# Patient Record
Sex: Female | Born: 1950
Health system: Southern US, Community
[De-identification: ages and names within clinical notes are randomized; demographics above are authoritative.]

## PROBLEM LIST (undated history)

## (undated) DIAGNOSIS — K5909 Other constipation: Secondary | ICD-10-CM

## (undated) DIAGNOSIS — I8390 Asymptomatic varicose veins of unspecified lower extremity: Secondary | ICD-10-CM

## (undated) DIAGNOSIS — G629 Polyneuropathy, unspecified: Secondary | ICD-10-CM

## (undated) DIAGNOSIS — E785 Hyperlipidemia, unspecified: Secondary | ICD-10-CM

## (undated) DIAGNOSIS — Z9189 Other specified personal risk factors, not elsewhere classified: Secondary | ICD-10-CM

## (undated) DIAGNOSIS — N3941 Urge incontinence: Secondary | ICD-10-CM

## (undated) DIAGNOSIS — K219 Gastro-esophageal reflux disease without esophagitis: Secondary | ICD-10-CM

## (undated) DIAGNOSIS — R600 Localized edema: Secondary | ICD-10-CM

## (undated) DIAGNOSIS — I879 Disorder of vein, unspecified: Secondary | ICD-10-CM

## (undated) DIAGNOSIS — Z860100 Personal history of colon polyps, unspecified: Secondary | ICD-10-CM

## (undated) DIAGNOSIS — M1990 Unspecified osteoarthritis, unspecified site: Secondary | ICD-10-CM

## (undated) DIAGNOSIS — I10 Essential (primary) hypertension: Secondary | ICD-10-CM

## (undated) DIAGNOSIS — M47812 Spondylosis without myelopathy or radiculopathy, cervical region: Secondary | ICD-10-CM

## (undated) DIAGNOSIS — Z87898 Personal history of other specified conditions: Secondary | ICD-10-CM

## (undated) DIAGNOSIS — N3090 Cystitis, unspecified without hematuria: Secondary | ICD-10-CM

## (undated) DIAGNOSIS — N950 Postmenopausal bleeding: Secondary | ICD-10-CM

## (undated) DIAGNOSIS — Z923 Personal history of irradiation: Secondary | ICD-10-CM

## (undated) DIAGNOSIS — K589 Irritable bowel syndrome without diarrhea: Secondary | ICD-10-CM

## (undated) DIAGNOSIS — E890 Postprocedural hypothyroidism: Secondary | ICD-10-CM

## (undated) HISTORY — PX: CATARACT EXTRACTION W/ INTRAOCULAR LENS  IMPLANT, BILATERAL: SHX1307

## (undated) HISTORY — PX: JOINT REPLACEMENT: SHX530

## (undated) HISTORY — DX: Cystitis, unspecified without hematuria: N30.90

## (undated) HISTORY — DX: Hyperlipidemia, unspecified: E78.5

## (undated) HISTORY — PX: VARICOSE VEIN SURGERY: SHX832

## (undated) HISTORY — PX: EYE SURGERY: SHX253

## (undated) HISTORY — DX: Irritable bowel syndrome, unspecified: K58.9

## (undated) HISTORY — PX: MOUTH SURGERY: SHX715

## (undated) HISTORY — DX: Essential (primary) hypertension: I10

## (undated) HISTORY — DX: Asymptomatic varicose veins of unspecified lower extremity: I83.90

## (undated) HISTORY — PX: TUBAL LIGATION: SHX77

## (undated) HISTORY — PX: DILATION AND CURETTAGE OF UTERUS: SHX78

## (undated) HISTORY — DX: Polyneuropathy, unspecified: G62.9

---
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
## 1991-10-30 HISTORY — PX: LAPAROSCOPIC CHOLECYSTECTOMY: SUR755

## 1998-06-21 ENCOUNTER — Other Ambulatory Visit: Admission: RE | Admit: 1998-06-21 | Discharge: 1998-06-21 | Payer: Self-pay | Admitting: Obstetrics and Gynecology

## 2000-05-09 ENCOUNTER — Other Ambulatory Visit: Admission: RE | Admit: 2000-05-09 | Discharge: 2000-05-09 | Payer: Self-pay | Admitting: Obstetrics and Gynecology

## 2000-10-25 ENCOUNTER — Encounter: Payer: Self-pay | Admitting: Internal Medicine

## 2000-12-08 ENCOUNTER — Ambulatory Visit (HOSPITAL_COMMUNITY): Admission: RE | Admit: 2000-12-08 | Discharge: 2000-12-08 | Payer: Self-pay | Admitting: Family Medicine

## 2000-12-08 ENCOUNTER — Encounter: Payer: Self-pay | Admitting: Family Medicine

## 2001-09-23 ENCOUNTER — Other Ambulatory Visit: Admission: RE | Admit: 2001-09-23 | Discharge: 2001-09-23 | Payer: Self-pay | Admitting: Obstetrics and Gynecology

## 2003-03-04 ENCOUNTER — Other Ambulatory Visit: Admission: RE | Admit: 2003-03-04 | Discharge: 2003-03-04 | Payer: Self-pay | Admitting: Obstetrics and Gynecology

## 2005-02-07 ENCOUNTER — Other Ambulatory Visit: Admission: RE | Admit: 2005-02-07 | Discharge: 2005-02-07 | Payer: Self-pay | Admitting: Obstetrics and Gynecology

## 2005-10-31 ENCOUNTER — Ambulatory Visit: Payer: Self-pay | Admitting: Cardiology

## 2007-01-07 ENCOUNTER — Ambulatory Visit: Payer: Self-pay | Admitting: Vascular Surgery

## 2007-04-14 ENCOUNTER — Ambulatory Visit: Payer: Self-pay | Admitting: Vascular Surgery

## 2007-04-22 ENCOUNTER — Ambulatory Visit: Payer: Self-pay | Admitting: Vascular Surgery

## 2007-04-28 ENCOUNTER — Ambulatory Visit: Payer: Self-pay | Admitting: Vascular Surgery

## 2007-04-29 HISTORY — PX: ENDOVENOUS ABLATION SAPHENOUS VEIN W/ LASER: SUR449

## 2007-05-06 ENCOUNTER — Ambulatory Visit: Payer: Self-pay | Admitting: Vascular Surgery

## 2007-06-24 ENCOUNTER — Ambulatory Visit: Payer: Self-pay | Admitting: Vascular Surgery

## 2008-10-11 ENCOUNTER — Ambulatory Visit: Payer: Self-pay | Admitting: Internal Medicine

## 2008-10-25 ENCOUNTER — Ambulatory Visit: Payer: Self-pay | Admitting: Internal Medicine

## 2008-10-25 ENCOUNTER — Encounter: Payer: Self-pay | Admitting: Internal Medicine

## 2008-10-25 HISTORY — PX: COLONOSCOPY W/ POLYPECTOMY: SHX1380

## 2008-10-31 ENCOUNTER — Encounter: Payer: Self-pay | Admitting: Internal Medicine

## 2009-02-28 ENCOUNTER — Ambulatory Visit: Payer: Self-pay | Admitting: Vascular Surgery

## 2009-04-25 ENCOUNTER — Ambulatory Visit: Payer: Self-pay | Admitting: Vascular Surgery

## 2009-05-03 ENCOUNTER — Ambulatory Visit: Payer: Self-pay | Admitting: Vascular Surgery

## 2009-12-19 ENCOUNTER — Inpatient Hospital Stay (HOSPITAL_COMMUNITY): Admission: RE | Admit: 2009-12-19 | Discharge: 2009-12-22 | Payer: Self-pay | Admitting: Orthopedic Surgery

## 2009-12-19 HISTORY — PX: TOTAL KNEE ARTHROPLASTY: SHX125

## 2010-01-16 ENCOUNTER — Encounter: Admission: RE | Admit: 2010-01-16 | Discharge: 2010-04-17 | Payer: Self-pay | Admitting: Orthopedic Surgery

## 2010-02-08 ENCOUNTER — Ambulatory Visit: Payer: Self-pay | Admitting: Vascular Surgery

## 2010-02-08 ENCOUNTER — Encounter (INDEPENDENT_AMBULATORY_CARE_PROVIDER_SITE_OTHER): Payer: Self-pay | Admitting: Orthopedic Surgery

## 2010-02-08 ENCOUNTER — Ambulatory Visit: Admission: RE | Admit: 2010-02-08 | Discharge: 2010-02-08 | Payer: Self-pay | Admitting: Orthopedic Surgery

## 2010-02-20 ENCOUNTER — Ambulatory Visit (HOSPITAL_COMMUNITY): Admission: RE | Admit: 2010-02-20 | Discharge: 2010-02-20 | Payer: Self-pay | Admitting: Orthopedic Surgery

## 2010-02-20 HISTORY — PX: OTHER SURGICAL HISTORY: SHX169

## 2010-04-18 ENCOUNTER — Encounter
Admission: RE | Admit: 2010-04-18 | Discharge: 2010-07-17 | Payer: Self-pay | Source: Home / Self Care | Admitting: Family Medicine

## 2010-11-22 LAB — CBC
HCT: 40 % (ref 36.0–46.0)
Hemoglobin: 13.9 g/dL (ref 12.0–15.0)
MCH: 28.8 pg (ref 26.0–34.0)
MCHC: 34.8 g/dL (ref 30.0–36.0)
MCV: 82.8 fL (ref 78.0–100.0)
Platelets: 187 10*3/uL (ref 150–400)
RBC: 4.83 MIL/uL (ref 3.87–5.11)
RDW: 13.7 % (ref 11.5–15.5)
WBC: 5.7 10*3/uL (ref 4.0–10.5)

## 2010-11-22 LAB — COMPREHENSIVE METABOLIC PANEL
ALT: 19 U/L (ref 0–35)
AST: 25 U/L (ref 0–37)
Albumin: 4 g/dL (ref 3.5–5.2)
Alkaline Phosphatase: 104 U/L (ref 39–117)
BUN: 13 mg/dL (ref 6–23)
CO2: 29 mEq/L (ref 19–32)
Calcium: 9.7 mg/dL (ref 8.4–10.5)
Chloride: 105 mEq/L (ref 96–112)
Creatinine, Ser: 1.07 mg/dL (ref 0.4–1.2)
GFR calc Af Amer: >60 mL/min (ref >60)
GFR calc non Af Amer: 52 mL/min — ABNORMAL LOW (ref >60)
Glucose, Bld: 94 mg/dL (ref 70–99)
Potassium: 4.6 mEq/L (ref 3.5–5.1)
Sodium: 140 mEq/L (ref 135–145)
Total Bilirubin: 0.5 mg/dL (ref 0.3–1.2)
Total Protein: 7.1 g/dL (ref 6.0–8.3)

## 2010-11-22 LAB — URINALYSIS, ROUTINE W REFLEX MICROSCOPIC
Bilirubin Urine: NEGATIVE
Hgb urine dipstick: NEGATIVE
Ketones, ur: NEGATIVE mg/dL
Nitrite: NEGATIVE
Protein, ur: NEGATIVE mg/dL
Specific Gravity, Urine: 1.006 (ref 1.005–1.030)
Urine Glucose, Fasting: NEGATIVE mg/dL
Urobilinogen, UA: 0.2 mg/dL (ref 0.0–1.0)
pH: 7 (ref 5.0–8.0)

## 2010-11-22 LAB — PROTIME-INR
INR: 1.12 (ref 0.00–1.49)
Prothrombin Time: 14.6 seconds (ref 11.6–15.2)

## 2010-11-22 LAB — APTT: aPTT: 32 seconds (ref 24–37)

## 2010-11-22 LAB — SURGICAL PCR SCREEN
MRSA, PCR: NEGATIVE
Staphylococcus aureus: NEGATIVE

## 2010-12-01 ENCOUNTER — Ambulatory Visit (HOSPITAL_COMMUNITY)
Admission: RE | Admit: 2010-12-01 | Discharge: 2010-12-02 | Disposition: A | Discharge status: Home / Self Care | Payer: BC Managed Care – PPO | Attending: Orthopedic Surgery | Admitting: Orthopedic Surgery

## 2010-12-01 DIAGNOSIS — Z01812 Encounter for preprocedural laboratory examination: Secondary | ICD-10-CM | POA: Insufficient documentation

## 2010-12-01 DIAGNOSIS — I10 Essential (primary) hypertension: Secondary | ICD-10-CM | POA: Insufficient documentation

## 2010-12-01 DIAGNOSIS — M24669 Ankylosis, unspecified knee: Secondary | ICD-10-CM | POA: Insufficient documentation

## 2010-12-01 DIAGNOSIS — M25669 Stiffness of unspecified knee, not elsewhere classified: Secondary | ICD-10-CM | POA: Insufficient documentation

## 2010-12-01 HISTORY — PX: OTHER SURGICAL HISTORY: SHX169

## 2010-12-05 ENCOUNTER — Ambulatory Visit: Payer: BC Managed Care – PPO | Attending: Orthopedic Surgery | Admitting: Physical Therapy

## 2010-12-05 DIAGNOSIS — IMO0001 Reserved for inherently not codable concepts without codable children: Secondary | ICD-10-CM | POA: Insufficient documentation

## 2010-12-05 DIAGNOSIS — Z96659 Presence of unspecified artificial knee joint: Secondary | ICD-10-CM | POA: Insufficient documentation

## 2010-12-05 DIAGNOSIS — R5381 Other malaise: Secondary | ICD-10-CM | POA: Insufficient documentation

## 2010-12-05 DIAGNOSIS — M25569 Pain in unspecified knee: Secondary | ICD-10-CM | POA: Insufficient documentation

## 2010-12-05 DIAGNOSIS — M25669 Stiffness of unspecified knee, not elsewhere classified: Secondary | ICD-10-CM | POA: Insufficient documentation

## 2010-12-06 ENCOUNTER — Ambulatory Visit: Payer: BC Managed Care – PPO | Admitting: Physical Therapy

## 2010-12-07 ENCOUNTER — Ambulatory Visit: Payer: BC Managed Care – PPO | Admitting: Physical Therapy

## 2010-12-07 ENCOUNTER — Ambulatory Visit: Payer: BC Managed Care – PPO | Admitting: *Deleted

## 2010-12-11 ENCOUNTER — Ambulatory Visit: Payer: BC Managed Care – PPO | Admitting: Physical Therapy

## 2010-12-13 ENCOUNTER — Ambulatory Visit: Payer: BC Managed Care – PPO | Admitting: Physical Therapy

## 2010-12-13 NOTE — Op Note (Signed)
  [name], [name]               ACCOUNT NO.:  [id number]  MEDICAL RECORD NO.:  [id number]           PATIENT TYPE:  I  LOCATION:  1613                         FACILITY:  [facility]  PHYSICIAN:  Ollen Gross, M.D.    DATE OF BIRTH:  01-16-1951  DATE OF PROCEDURE: DATE OF DISCHARGE:                              OPERATIVE REPORT   PREOPERATIVE DIAGNOSIS:  Arthrofibrosis, left knee.  POSTOPERATIVE DIAGNOSIS:  Arthrofibrosis, left knee.  PROCEDURE:  Left knee arthrotomy with lysis of adhesions.  SURGEON:  Ollen Gross, MD  ASSISTANT:  Nurse assist.  ANESTHESIA:  General.  ESTIMATED BLOOD LOSS:  Minimal.  DRAINS:  Hemovac x1.  TOURNIQUET TIME:  Approximately 20 minutes at 300 mmHg.  COMPLICATIONS:  None.  CONDITION:  Stable to recovery room.  CLINICAL NOTE:  Miranda Wilson is a 60 year old female who had a left total knee arthroplasty done approximately a year ago.  She never did well with range of motion.  She had manipulation but did not maintain her motion.  She has developed significant scarring range, about 5-85.  She presents now for arthrotomy with lysis of adhesions.  PROCEDURE IN DETAIL:  After successful administration of general anesthetic, a tourniquet was placed high on her left thigh and her left lower extremity was prepped and draped in the usual sterile fashion. Extremities wrapped in Esmarch, the knee flexed, tourniquet inflated to 300 mmHg.  Her range is about 5-85.  I did a gentle manipulation with my chest against her proximal tibia and navicular at 100.  We then used a 10 blade making a skin incision through subcutaneous tissue to the level of the extensor mechanism and a fresh blade is used to make a medial arthrotomy.  I did not encounter any fluid in the joint.  There was exuberant scar present underneath the extensor mechanism.  I subperiosteally elevated the soft tissue on the proximal medial tibia around the joint line to the semimembranosus bursa.   All the thickened scar which is about an inch of scar was excised from underneath the medial side.  We then did the same for lateral side with attention being paid to avoid patellar tendon on tibial tubercle.  This really freed up the knee nicely.  I was able to get her flex down to 115 to 120 degrees at which point the cast and posterior thigh were touching.  Then thoroughly irrigated the joint with saline solution.  The arthrotomy was closed over Hemovac drain with interrupted #1 PDS.  Flexion against gravity at that point was about 115 once again with the calf in the thigh.  The tourniquet is released, total time is approximately 20 minutes.  Subcu is closed with interrupted 2-0 Vicryl, subcuticular running 4-0 Monocryl.  Incisions cleaned and dried and bulky sterile dressing applied.  Drains hooked to suction and she is awakened and transported to recovery in stable condition.     Ollen Gross, M.D.     FA/MEDQ  D:  12/01/2010  T:  12/01/2010  Job:  [id number]  Electronically Signed by Ollen Gross M.D. on 12/13/2010 02:54:04 PM

## 2010-12-15 ENCOUNTER — Ambulatory Visit: Payer: BC Managed Care – PPO | Admitting: Physical Therapy

## 2010-12-19 ENCOUNTER — Ambulatory Visit: Payer: BC Managed Care – PPO | Admitting: Physical Therapy

## 2010-12-20 ENCOUNTER — Ambulatory Visit: Payer: BC Managed Care – PPO | Admitting: Physical Therapy

## 2010-12-21 ENCOUNTER — Ambulatory Visit: Payer: BC Managed Care – PPO | Admitting: Physical Therapy

## 2010-12-25 ENCOUNTER — Ambulatory Visit: Payer: BC Managed Care – PPO | Admitting: Physical Therapy

## 2010-12-27 ENCOUNTER — Ambulatory Visit: Payer: BC Managed Care – PPO | Admitting: Physical Therapy

## 2010-12-29 ENCOUNTER — Ambulatory Visit: Payer: BC Managed Care – PPO | Attending: Orthopedic Surgery | Admitting: *Deleted

## 2010-12-29 DIAGNOSIS — M25669 Stiffness of unspecified knee, not elsewhere classified: Secondary | ICD-10-CM | POA: Insufficient documentation

## 2010-12-29 DIAGNOSIS — R5381 Other malaise: Secondary | ICD-10-CM | POA: Insufficient documentation

## 2010-12-29 DIAGNOSIS — M25569 Pain in unspecified knee: Secondary | ICD-10-CM | POA: Insufficient documentation

## 2010-12-29 DIAGNOSIS — Z96659 Presence of unspecified artificial knee joint: Secondary | ICD-10-CM | POA: Insufficient documentation

## 2010-12-29 DIAGNOSIS — IMO0001 Reserved for inherently not codable concepts without codable children: Secondary | ICD-10-CM | POA: Insufficient documentation

## 2011-01-01 ENCOUNTER — Ambulatory Visit: Payer: BC Managed Care – PPO | Admitting: Physical Therapy

## 2011-01-03 ENCOUNTER — Ambulatory Visit: Payer: BC Managed Care – PPO | Admitting: Physical Therapy

## 2011-01-04 ENCOUNTER — Ambulatory Visit: Payer: BC Managed Care – PPO | Admitting: Physical Therapy

## 2011-01-08 ENCOUNTER — Ambulatory Visit: Payer: BC Managed Care – PPO | Admitting: Physical Therapy

## 2011-01-11 ENCOUNTER — Ambulatory Visit: Payer: BC Managed Care – PPO | Admitting: Physical Therapy

## 2011-01-12 ENCOUNTER — Ambulatory Visit: Payer: BC Managed Care – PPO | Admitting: *Deleted

## 2011-01-15 ENCOUNTER — Ambulatory Visit: Payer: BC Managed Care – PPO | Admitting: Physical Therapy

## 2011-01-16 LAB — COMPREHENSIVE METABOLIC PANEL
AST: 24 U/L (ref 0–37)
Albumin: 4.1 g/dL (ref 3.5–5.2)
BUN: 9 mg/dL (ref 6–23)
Calcium: 9.3 mg/dL (ref 8.4–10.5)
Creatinine, Ser: 0.94 mg/dL (ref 0.4–1.2)
GFR calc Af Amer: >60 mL/min (ref >60)
Total Protein: 6.8 g/dL (ref 6.0–8.3)

## 2011-01-17 ENCOUNTER — Ambulatory Visit: Payer: BC Managed Care – PPO | Admitting: Physical Therapy

## 2011-01-18 LAB — CBC
HCT: 31 % — ABNORMAL LOW (ref 36.0–46.0)
HCT: 31.2 % — ABNORMAL LOW (ref 36.0–46.0)
HCT: 32.6 % — ABNORMAL LOW (ref 36.0–46.0)
Hemoglobin: 11 g/dL — ABNORMAL LOW (ref 12.0–15.0)
Hemoglobin: 11.3 g/dL — ABNORMAL LOW (ref 12.0–15.0)
MCHC: 34.7 g/dL (ref 30.0–36.0)
MCHC: 35.4 g/dL (ref 30.0–36.0)
MCV: 87.2 fL (ref 78.0–100.0)
MCV: 87.8 fL (ref 78.0–100.0)
RBC: 3.53 MIL/uL — ABNORMAL LOW (ref 3.87–5.11)
RBC: 3.58 MIL/uL — ABNORMAL LOW (ref 3.87–5.11)
RBC: 3.69 MIL/uL — ABNORMAL LOW (ref 3.87–5.11)
WBC: 5.8 10*3/uL (ref 4.0–10.5)

## 2011-01-18 LAB — COMPREHENSIVE METABOLIC PANEL
ALT: 23 U/L (ref 0–35)
Alkaline Phosphatase: 88 U/L (ref 39–117)
BUN: 11 mg/dL (ref 6–23)
CO2: 28 mEq/L (ref 19–32)
Chloride: 107 mEq/L (ref 96–112)
GFR calc non Af Amer: 57 mL/min — ABNORMAL LOW (ref >60)
Glucose, Bld: 99 mg/dL (ref 70–99)
Potassium: 4.7 mEq/L (ref 3.5–5.1)
Sodium: 144 mEq/L (ref 135–145)
Total Bilirubin: 0.6 mg/dL (ref 0.3–1.2)
Total Protein: 7 g/dL (ref 6.0–8.3)

## 2011-01-18 LAB — BASIC METABOLIC PANEL
CO2: 27 mEq/L (ref 19–32)
Calcium: 8.3 mg/dL — ABNORMAL LOW (ref 8.4–10.5)
Chloride: 109 mEq/L (ref 96–112)
GFR calc Af Amer: >60 mL/min (ref >60)
GFR calc Af Amer: >60 mL/min (ref >60)
Potassium: 4.3 mEq/L (ref 3.5–5.1)
Potassium: 4.3 mEq/L (ref 3.5–5.1)
Sodium: 136 mEq/L (ref 135–145)

## 2011-01-18 LAB — PROTIME-INR
INR: 1.07 (ref 0.00–1.49)
Prothrombin Time: 13 seconds (ref 11.6–15.2)

## 2011-01-18 LAB — TYPE AND SCREEN: Antibody Screen: NEGATIVE

## 2011-01-19 ENCOUNTER — Ambulatory Visit: Payer: BC Managed Care – PPO | Admitting: Physical Therapy

## 2011-01-22 ENCOUNTER — Ambulatory Visit: Payer: BC Managed Care – PPO | Admitting: Physical Therapy

## 2011-01-24 ENCOUNTER — Ambulatory Visit: Payer: BC Managed Care – PPO | Admitting: Physical Therapy

## 2011-01-26 ENCOUNTER — Ambulatory Visit: Payer: BC Managed Care – PPO | Admitting: Physical Therapy

## 2011-01-29 ENCOUNTER — Ambulatory Visit: Payer: BC Managed Care – PPO | Attending: Orthopedic Surgery | Admitting: Physical Therapy

## 2011-01-29 DIAGNOSIS — R5381 Other malaise: Secondary | ICD-10-CM | POA: Insufficient documentation

## 2011-01-29 DIAGNOSIS — IMO0001 Reserved for inherently not codable concepts without codable children: Secondary | ICD-10-CM | POA: Insufficient documentation

## 2011-01-29 DIAGNOSIS — M25569 Pain in unspecified knee: Secondary | ICD-10-CM | POA: Insufficient documentation

## 2011-01-29 DIAGNOSIS — M25669 Stiffness of unspecified knee, not elsewhere classified: Secondary | ICD-10-CM | POA: Insufficient documentation

## 2011-01-29 DIAGNOSIS — Z96659 Presence of unspecified artificial knee joint: Secondary | ICD-10-CM | POA: Insufficient documentation

## 2011-01-31 ENCOUNTER — Ambulatory Visit: Payer: BC Managed Care – PPO | Admitting: Physical Therapy

## 2011-02-05 ENCOUNTER — Ambulatory Visit: Payer: BC Managed Care – PPO | Admitting: Physical Therapy

## 2011-02-07 ENCOUNTER — Ambulatory Visit: Payer: BC Managed Care – PPO | Admitting: Physical Therapy

## 2011-02-09 ENCOUNTER — Ambulatory Visit: Payer: BC Managed Care – PPO | Admitting: Physical Therapy

## 2011-02-13 ENCOUNTER — Ambulatory Visit: Payer: BC Managed Care – PPO | Admitting: Physical Therapy

## 2011-02-15 ENCOUNTER — Ambulatory Visit: Payer: BC Managed Care – PPO | Admitting: Physical Therapy

## 2011-02-19 ENCOUNTER — Ambulatory Visit: Payer: BC Managed Care – PPO | Admitting: Physical Therapy

## 2011-02-22 ENCOUNTER — Ambulatory Visit: Payer: BC Managed Care – PPO | Admitting: *Deleted

## 2011-02-26 ENCOUNTER — Ambulatory Visit: Payer: BC Managed Care – PPO | Admitting: Physical Therapy

## 2011-03-01 ENCOUNTER — Ambulatory Visit: Payer: BC Managed Care – PPO | Attending: Orthopedic Surgery | Admitting: Physical Therapy

## 2011-03-01 DIAGNOSIS — M25569 Pain in unspecified knee: Secondary | ICD-10-CM | POA: Insufficient documentation

## 2011-03-01 DIAGNOSIS — R5381 Other malaise: Secondary | ICD-10-CM | POA: Insufficient documentation

## 2011-03-01 DIAGNOSIS — IMO0001 Reserved for inherently not codable concepts without codable children: Secondary | ICD-10-CM | POA: Insufficient documentation

## 2011-03-01 DIAGNOSIS — M25669 Stiffness of unspecified knee, not elsewhere classified: Secondary | ICD-10-CM | POA: Insufficient documentation

## 2011-03-01 DIAGNOSIS — Z96659 Presence of unspecified artificial knee joint: Secondary | ICD-10-CM | POA: Insufficient documentation

## 2011-03-02 ENCOUNTER — Other Ambulatory Visit: Payer: Self-pay | Admitting: Obstetrics and Gynecology

## 2011-03-05 ENCOUNTER — Ambulatory Visit: Payer: BC Managed Care – PPO | Admitting: Physical Therapy

## 2011-03-07 ENCOUNTER — Encounter: Payer: Self-pay | Admitting: Family Medicine

## 2011-03-08 ENCOUNTER — Ambulatory Visit: Payer: BC Managed Care – PPO | Admitting: Physical Therapy

## 2011-03-13 NOTE — Assessment & Plan Note (Signed)
OFFICE VISIT   Miranda, Wilson  DOB:  [birth year]                                       04/22/2007  ZOXWR#:[id number]   [name] returns 7 days post laser ablation of the right greater  saphenous vein with multiple stab phlebectomies.  She has some moderate  ecchymoses in the mid to distal thigh with some mild to moderate  discomfort along the course of the greater saphenous vein.  The stab  phlebectomy wounds are all healing nicely and involve the mid to distal  thigh, as well as the proximal calf.  She has no distal edema.  A  limited venous duplex exam was performed in the office today.  The deep  venous system is widely patent with no evidence of obstruction, and the  greater saphenous vein is occluded from just near the sapheno-femoral  junction down to the knee.  She was reassured regarding these findings.  Will continue wearing the elastic compression stocking and return next  week for the procedure on the contralateral left side.   [name], M.D.  Electronically Signed   JDL/MEDQ  D:  04/22/2007  T:  04/23/2007  Job:  [id number]

## 2011-03-13 NOTE — Assessment & Plan Note (Signed)
OFFICE VISIT   Miranda Wilson, Miranda Wilson  DOB:  28-Apr-1951                                       05/03/2009  ZOXWR#:[id number]   The patient had laser ablation of the lateral branch of the left great  saphenous vein ones week ago.  This communicated with the saphenofemoral  junction was causing gross reflux down to the distal left leg and  causing recurrent pain.  She had minimal discomfort associated with the  procedure a week ago and is now discontinuing her ibuprofen which she  has been taking as directed (9 per day).  She is having no distal edema  in the left leg.  Elastic stocking has been worn.  Venous duplex exam  performed today reveals no evidence of deep venous obstruction.  The  entire great saphenous vein including the lateral branch is now occluded  from the knee to the groin with no flow and there is some mild deep  venous insufficiency noted.   I reassured her regarding these findings.  She will wear the stocking  for one more week and continue to increase her activity as tolerated.  Return to see [place] on a p.r.n. basis.   [name], M.D.  Electronically Signed   JDL/MEDQ  D:  05/03/2009  T:  05/04/2009  Job:  [id number]

## 2011-03-13 NOTE — Procedures (Signed)
DUPLEX DEEP VENOUS EXAM - LOWER EXTREMITY   INDICATION:  Followup left lateral branch of greater saphenous vein  ablation.   HISTORY:  Edema:  Minimal left ankle/foot  Trauma/Surgery:  Left greater saphenous vein lateral branch ablation  April 25, 2009; left greater saphenous vein ablation April 28, 2007.  Both  by Dr. Hart [place].  Pain:  Minimal proximal left lower extremity  PE:  No  Previous DVT:  No  Anticoagulants:  No  Other:   DUPLEX EXAM:                CFV   SFV   PopV  PTV    GSV                R  L  R  L  R  L  R   L  R  L  Thrombosis    0  0     0     0      0     +  Spontaneous   +  +     +     +      +     0  Phasic        +  +     +     +      +     0  Augmentation  +  +     +     +      +     0  Compressible  +  +     +     +      +     0  Competent     0  0     0     0            0   Legend:  + - yes  o - no  p - partial  D - decreased   IMPRESSION:  No evidence of DVT in left lower extremity or right common  femoral vein.  Evidence of ablation in left greater saphenous vein from groin to knee  without flow.  Evidence of ablation in left greater saphenous vein lateral branch from  groin to mid-thigh without flow.  Evidence of deep venous insufficiency.        _____________________________  [name], M.D.   AS/MEDQ  D:  05/03/2009  T:  05/03/2009  Job:  [id number]

## 2011-03-13 NOTE — Assessment & Plan Note (Signed)
OFFICE VISIT   Miranda Wilson, Miranda Wilson  DOB:  06-Aug-1951                                       06/24/2007  MWUXL#:[id number]   Hoskinson is 6 weeks status post laser ablation left greater saphenous vein  with stab phlebectomies and 8 weeks status post laser ablation stab  phlebectomies right leg. 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 She is standing on her  feet all day. Today wore elastic compression stockings which do irritate  her thighs somewhat but swelling was much better. She has also been  taking a diuretic.   PHYSICAL EXAMINATION:  Reveals minimal edema since the stockings have  been worn today. The stab phlebectomy wounds are all well-healed. The  calves nontender and everything looks appropriate. Blood pressure  135/80, heart rate 63, respirations are 18.   I recommended that she continue to wear elastic compression stockings,  elevate the legs at night, try diuretics late in the day, and if she  continues to have generalized edema she should be evaluated by her  medical doctor.   [name], M.D.  Electronically Signed   JDL/MEDQ  D:  06/24/2007  T:  06/25/2007  Job:  [id number]

## 2011-03-13 NOTE — Assessment & Plan Note (Signed)
OFFICE VISIT   Miranda Wilson, Miranda Wilson  DOB:  04-15-1951                                       05/06/2007  [id number]#:[id number]   OFFICE NOTE   The patient is 1 week post laser ablation of her left greater saphenous  vein with greater than 20 stab phlebectomies in the left thigh and calf.  She has had a moderate amount of bruising in the left thigh over the  ablation site and a few of the stab phlebectomy wounds in the distal  thigh with some mild to moderate tenderness, although she states this is  improving.  She has been wearing her elastic compression stocking.  She  has had no distal edema.  There has been minimal pain in the stab  phlebectomy sites in the calf.  I have performed a venous duplex exam  today.  She had no evidence of deep vein thrombosis or any obstruction  of the deep system and the saphenous vein is occluded from near the  saphenofemoral junction down to the distal thigh.  She was reassured  regarding these findings.  Will return in 3 months for continued  followup.   [name], M.D.  Electronically Signed   JDL/MEDQ  D:  05/06/2007  T:  05/07/2007  Job:  [id number]

## 2011-03-13 NOTE — Assessment & Plan Note (Signed)
OFFICE VISIT   KINGA, CASSAR  DOB:  [birth year]                                       02/28/2009  ZOXWR#:[id number]   The patient underwent laser ablation of her left great saphenous vein in  June 2008 for painful varicosities and also had 20 stab phlebectomies  for these painful varicosities.  She early on had a good result but has  had worsening of the edema in the left leg and increasing pain and  discoloration in the lower pretibial area of the leg over the last  several months.  She bumped her pretibial area in November 2009 and  developed some firmness in this area which has persisted and has  continued to cause pain.  She has worn long-leg elastic compression  stockings and tried elevating the legs multiple times a day as well as  analgesics on a regular basis (ibuprofen) and has continued to have pain  and worsening edema of the left leg over the last 6 months.  She had no  stasis ulcers or bleeding varicosities or other complicating factors.   PHYSICAL EXAMINATION:  On exam today she does have 2+ edema in the left  leg particularly from the knee to the ankle with some thickening of the  skin of hyperpigmentation medially, between the medial malleolus and the  knee and two firm areas to palpation.  No bulging varicosities are  noted.  Right leg is unremarkable with exception of some small  varicosities around the knee medially.   Venous duplex exam today has the following findings:  1. Deep venous system is widely patent.  2. The great saphenous vein in the left leg is occluded from the mid      thigh distally.  3. There is a large component of the saphenofemoral junction which      communicates with the lateral branch which has gross reflux down to      the mid thigh before it becomes superficial and communicates with      varicosities.  This is the source of the high venous pressure in      the left leg.   Her symptoms are definitely effecting  her daily living and not  responding to conservative treatment.  I think we should proceed with  laser ablation of the left great saphenous vein (lateral branch) from  the mid thigh up to the saphenofemoral junction to eliminate reflux and  hopefully eliminate her edema and improve her symptoms.  We will proceed  with precertification.   [name], M.D.  Electronically Signed   JDL/MEDQ  D:  02/28/2009  T:  03/01/2009  Job:  [id number]

## 2011-06-07 ENCOUNTER — Encounter: Payer: Self-pay | Admitting: Vascular Surgery

## 2011-06-11 ENCOUNTER — Encounter (INDEPENDENT_AMBULATORY_CARE_PROVIDER_SITE_OTHER): Payer: BC Managed Care – PPO

## 2011-06-11 ENCOUNTER — Encounter: Payer: Self-pay | Admitting: Vascular Surgery

## 2011-06-11 DIAGNOSIS — R609 Edema, unspecified: Secondary | ICD-10-CM

## 2011-06-11 DIAGNOSIS — I83893 Varicose veins of bilateral lower extremities with other complications: Secondary | ICD-10-CM

## 2011-06-11 DIAGNOSIS — Z48812 Encounter for surgical aftercare following surgery on the circulatory system: Secondary | ICD-10-CM

## 2011-06-11 DIAGNOSIS — M7989 Other specified soft tissue disorders: Secondary | ICD-10-CM

## 2011-06-12 ENCOUNTER — Ambulatory Visit (INDEPENDENT_AMBULATORY_CARE_PROVIDER_SITE_OTHER): Payer: BC Managed Care – PPO | Admitting: Vascular Surgery

## 2011-06-12 VITALS — BP 140/77 | HR 62 | Resp 20 | Ht 64.5 in | Wt 198.0 lb

## 2011-06-12 DIAGNOSIS — I83893 Varicose veins of bilateral lower extremities with other complications: Secondary | ICD-10-CM

## 2011-06-12 NOTE — Progress Notes (Signed)
Subjective:     Patient ID: Miranda Wilson, female   DOB: 12-07-1950, 60 y.o.   MRN: [id number]  HPI this patient had laser ablation of left great saphenous vein with stab phlebectomy was performed in June of 08. She then had laser ablation of the left great saphenous vein lateral accessory trunk in June of 2010. She had a knee replacement performed in February of 2011 and since then has required both an open and close manipulation of the left knee because of decreased range of motion. She has been having significant swelling in the left leg she's been aggravated by her surgical procedures she has tried where short-leg last compression stockings but has swelling occur proximal to the stocking. Her long-leg stockings are too tight. She does not elevate her legs at night nor intermittently during the day. She had an episode of suspected phlebitis in the lower leg just proximal to the left ankle 2 months ago. She has had no DVT.   Review of Systems     Objective:   Physical Exam today blood pressure 140/77 heart rate 62 respirations 20 She's alert and oriented x3 in no apparent distress Left leg has diffuse edema from the mid thigh to the foot. His is 1-2+. There is mild discomfort in an area proximal to the left medial malleolus which appears to be no active ulcerations are noted. She has 2+ pulses in the left leg. l resolving thrombophlebitis.    Assessment:    venous duplex exam was ordered by me today and interpreted of both lower extremities. Left leg has complete closure of the left great saphenous system. There is no DVT. Both small saphenous veins are competent. The right proximal GSv is patent with reflux down to the mid thigh level.    Plan:    #1 elevate for the bed 3-4 inches #2 we'll fit her today for Long leg elastic compression stockings which she will apply first thing in the morning #3 intermittent elevation of the leg during the day #4 warm compresses near left ankle for resolving  thrombophlebitis

## 2011-06-12 NOTE — Progress Notes (Signed)
Left leg pain, swelling, thrombophlebitis for greater than 2 months.

## 2011-06-22 NOTE — Procedures (Unsigned)
DUPLEX DEEP VENOUS EXAM - LOWER EXTREMITY  INDICATION:  Edema and varicose veins.  HISTORY:  Edema:  Yes. Trauma/Surgery:  Right great saphenous vein ablated 04/14/2007; left great saphenous ablated 04/28/2007; left lateral branch ablated 04/25/2009. Pain:  Yes. PE:  No. Previous DVT:  No. Anticoagulants:  No. Other:  DUPLEX EXAM:               CFV   SFV   PopV  PTV    GSV               R  L  R  L  R  L  R   L  R  L Thrombosis    o  o  o  o  o  o  o   o  P  + Spontaneous   +  +  +  +  +  +  +   +  o  o Phasic        +  +  +  +  +  +  +   +  +  o Augmentation  +  +  +  +  +  +  +   +  D  o Compressible  +  +  +  +  +  +  +   +  P  o Competent     o  +  +  o  +  +  +   +  o  +  Legend:  + - yes  o - no  p - partial  D - decreased  IMPRESSION: 1. No evidence of deep vein thrombosis present in the bilateral lower     extremities. 2. Partial right GSV ablation closure from mid to distal thigh     segment. 3. The remainder of the right great saphenous vein from saphenofemoral     junction to mid thigh and knee level is open with >500 milliseconds     reflux present. 4. The left great saphenous vein and lateral branch appear thrombosed     with good post ablation result from groin to distal thigh segment. 5. Patent competent bilateral small saphenous veins at the knee. 6. Incompetent perforator present at the left lateral mid/distal calf     segment measuring 0.44 cm in diameter with >500 milliseconds reflux     present. 7. The left great saphenous vein knee segment presents with reflux of     >500 milliseconds.  Varicose veins are noted arising off the     greater saphenous vein in the calf segments of the left great     saphenous.       _____________________________ [name], M.D.  SH/MEDQ  D:  06/11/2011  T:  06/11/2011  Job:  [id number]

## 2011-08-14 ENCOUNTER — Institutional Professional Consult (permissible substitution): Payer: BC Managed Care – PPO | Admitting: Internal Medicine

## 2011-08-20 ENCOUNTER — Ambulatory Visit (INDEPENDENT_AMBULATORY_CARE_PROVIDER_SITE_OTHER): Payer: BC Managed Care – PPO | Admitting: Internal Medicine

## 2011-08-20 ENCOUNTER — Encounter: Payer: Self-pay | Admitting: Internal Medicine

## 2011-08-20 DIAGNOSIS — R9389 Abnormal findings on diagnostic imaging of other specified body structures: Secondary | ICD-10-CM

## 2011-08-20 DIAGNOSIS — R05 Cough: Secondary | ICD-10-CM

## 2011-08-20 DIAGNOSIS — R918 Other nonspecific abnormal finding of lung field: Secondary | ICD-10-CM

## 2011-08-20 NOTE — Progress Notes (Signed)
  Subjective:    Patient ID: Miranda Wilson, female    DOB: 1951/05/28, 60 y.o.   MRN: [id number]  HPI   61 yowf exposed to cig smoke as child until age 58 but never smoked with tendency to lingering cough with colds not more than a month typically referred by Dr Christell Constant to pulmonary clinic 07/2011 for refractory cough and cxr suggesting copd.    08/20/2011 Initial pulmonary office eval cc sore throat then cough abrupt end Sept and seen by Dr Christell Constant Oct 3 rx with mucinex with multiple family members sick at the same time with some dysphagia and cough is worse p eating. Has noticed some doe x housework. Overall improving,  More productive p mucuinex with yellow less dark worse in am p stirring.  Does not wake up p hs.  No previous h/o overt hb/ seasonal rhinitis or asthma, itching or sneezing or wheezing.  Chronic left leg swelling L leg      Review of Systems  Constitutional: Negative for fever, chills and unexpected weight change.  HENT: Positive for trouble swallowing. Negative for ear pain, nosebleeds, congestion, sore throat, rhinorrhea, sneezing, dental problem, voice change, postnasal drip and sinus pressure.   Eyes: Negative for visual disturbance.  Respiratory: Positive for cough and shortness of breath. Negative for choking.   Cardiovascular: Positive for leg swelling. Negative for chest pain.  Gastrointestinal: Negative for vomiting, abdominal pain and diarrhea.  Genitourinary: Negative for difficulty urinating.  Musculoskeletal: Positive for arthralgias.  Skin: Negative for rash.  Neurological: Negative for tremors, syncope and headaches.  Hematological: Bruises/bleeds easily.       Objective:   Physical Exam  amb wf wt 205 08/20/2011  HEENT: nl dentition, turbinates, and orophanx. Nl external ear canals without cough reflex   NECK :  without JVD/Nodes/TM/ nl carotid upstrokes bilaterally   LUNGS: no acc muscle use, clear to A and P bilaterally without cough on insp  or exp maneuvers   CV:  RRR  no s3 or murmur or increase in P2, wearing ted hose  ABD:  soft and nontender with nl excursion in the supine position. No bruits or organomegaly, bowel sounds nl  MS:  warm without deformities, calf tenderness, cyanosis or clubbing  SKIN: warm and dry without lesions    NEURO:  alert, approp, no deficits   cxr 08/01/11 read as copd    Assessment & Plan:

## 2011-08-20 NOTE — Patient Instructions (Signed)
You do not have significant copd  Deslym is the best over the counter cough medication  Try prilosec 20mg   Take 30-60 min before first meal of the day and Pepcid 20 mg one bedtime until cough is completely gone for at least a week without the need for cough suppression  I think of reflux for chronic cough like I do oxygen for fire (doesn't cause the fire but once you get the oxygen suppressed it usually goes away regardless of the exact cause).   GERD (REFLUX)  is an extremely common cause of respiratory symptoms, many times with no significant heartburn at all.    It can be treated with medication, but also with lifestyle changes including avoidance of late meals, excessive alcohol, smoking cessation, and avoid fatty foods, chocolate, peppermint, colas, red wine, and acidic juices such as orange juice.  NO MINT OR MENTHOL PRODUCTS SO NO COUGH DROPS  USE SUGARLESS CANDY INSTEAD (jolley ranchers or Stover's)  NO OIL BASED VITAMINS - use powdered substitutes.   Please schedule a follow up office visit in 6 weeks, call sooner if needed for CXR

## 2011-08-21 ENCOUNTER — Encounter: Payer: Self-pay | Admitting: Internal Medicine

## 2011-08-21 DIAGNOSIS — R05 Cough: Secondary | ICD-10-CM | POA: Insufficient documentation

## 2011-08-21 DIAGNOSIS — R059 Cough, unspecified: Secondary | ICD-10-CM | POA: Insufficient documentation

## 2011-08-21 DIAGNOSIS — R9389 Abnormal findings on diagnostic imaging of other specified body structures: Secondary | ICD-10-CM | POA: Insufficient documentation

## 2011-08-21 NOTE — Assessment & Plan Note (Signed)
The most common causes of chronic cough in immunocompetent adults include the following: upper airway cough syndrome (UACS), previously referred to as postnasal drip syndrome (PNDS), which is caused by variety of rhinosinus conditions; (2) asthma; (3) GERD; (4) chronic bronchitis from cigarette smoking or other inhaled environmental irritants; (5) nonasthmatic eosinophilic bronchitis; and (6) bronchiectasis.   These conditions, singly or in combination, have accounted for up to 94% of the causes of chronic cough in prospective studies.   Other conditions have constituted no >6% of the causes in prospective studies These have included bronchogenic carcinoma, chronic interstitial pneumonia, sarcoidosis, left ventricular failure, ACEI-induced cough, and aspiration from a condition associated with pharyngeal dysfunction.  Of the three most common causes of chronic cough, only one (GERD)  can actually cause the other two (asthma and post nasal drip syndrome)  and perpetuate the cylce of cough inducing airway trauma, inflammation, heightened sensitivity to reflux which is prompted by the cough itself via a cyclical mechanism.    This may partially respond to steroids and look like asthma and post nasal drainage but never erradicated completely unless the cough and the secondary reflux are eliminated, preferably both at the same time.  While not intuitively obvious, many patients with chronic low grade reflux do not cough until there is a secondary insult that disturbs the protective epithelial barrier and exposes sensitive nerve endings.  This can be viral or direct physical injury such as with an endotracheal tube.   The point is that once this occurs, it is difficult to eliminate using anything but a maximally effective acid suppression regimen at least in the short run, accompanied by an appropriate diet to address non acid GERD.   See instructions for specific recommendations which were reviewed directly  with the patient who was given a copy with highlighter outlining the key components.  

## 2011-08-21 NOTE — Assessment & Plan Note (Signed)
Very unlikley she has sign copd though does have a tendency to recurrent cough and positive past exposure to cigarettes.  Will do alpha one genotype testing and PFTs to be complete

## 2011-09-13 ENCOUNTER — Encounter: Payer: Self-pay | Admitting: Internal Medicine

## 2011-09-14 ENCOUNTER — Telehealth: Payer: Self-pay | Admitting: Internal Medicine

## 2011-09-14 NOTE — Telephone Encounter (Signed)
Per MW alpha 1 was neg. Spoke with pt and notified of this and she verbalized understanding and denied any questions.

## 2011-09-24 ENCOUNTER — Ambulatory Visit: Payer: BC Managed Care – PPO | Admitting: Internal Medicine

## 2011-09-27 ENCOUNTER — Encounter: Payer: Self-pay | Admitting: Internal Medicine

## 2012-12-24 DIAGNOSIS — I83893 Varicose veins of bilateral lower extremities with other complications: Secondary | ICD-10-CM

## 2013-03-18 ENCOUNTER — Other Ambulatory Visit: Payer: Self-pay | Admitting: Family Medicine

## 2013-03-24 ENCOUNTER — Encounter: Payer: Self-pay | Admitting: *Deleted

## 2013-03-24 ENCOUNTER — Other Ambulatory Visit: Payer: Self-pay | Admitting: Family Medicine

## 2013-04-15 ENCOUNTER — Ambulatory Visit: Payer: Self-pay | Admitting: Family Medicine

## 2013-04-28 ENCOUNTER — Encounter: Payer: Self-pay | Admitting: Family Medicine

## 2013-04-28 ENCOUNTER — Ambulatory Visit (INDEPENDENT_AMBULATORY_CARE_PROVIDER_SITE_OTHER): Payer: BC Managed Care – PPO | Admitting: Family Medicine

## 2013-04-28 VITALS — BP 126/68 | HR 54 | Temp 97.0°F | Ht 63.0 in | Wt 211.6 lb

## 2013-04-28 DIAGNOSIS — E559 Vitamin D deficiency, unspecified: Secondary | ICD-10-CM | POA: Insufficient documentation

## 2013-04-28 DIAGNOSIS — M1990 Unspecified osteoarthritis, unspecified site: Secondary | ICD-10-CM

## 2013-04-28 DIAGNOSIS — R5381 Other malaise: Secondary | ICD-10-CM

## 2013-04-28 DIAGNOSIS — E039 Hypothyroidism, unspecified: Secondary | ICD-10-CM

## 2013-04-28 DIAGNOSIS — E785 Hyperlipidemia, unspecified: Secondary | ICD-10-CM | POA: Insufficient documentation

## 2013-04-28 DIAGNOSIS — M4716 Other spondylosis with myelopathy, lumbar region: Secondary | ICD-10-CM

## 2013-04-28 DIAGNOSIS — R5383 Other fatigue: Secondary | ICD-10-CM

## 2013-04-28 DIAGNOSIS — R32 Unspecified urinary incontinence: Secondary | ICD-10-CM

## 2013-04-28 DIAGNOSIS — L259 Unspecified contact dermatitis, unspecified cause: Secondary | ICD-10-CM

## 2013-04-28 DIAGNOSIS — L309 Dermatitis, unspecified: Secondary | ICD-10-CM

## 2013-04-28 DIAGNOSIS — I10 Essential (primary) hypertension: Secondary | ICD-10-CM

## 2013-04-28 LAB — THYROID PANEL WITH TSH
Free Thyroxine Index: 4.6 — ABNORMAL HIGH (ref 1.0–3.9)
T3 Uptake: 29.5 % (ref 22.5–37.0)

## 2013-04-28 LAB — POCT CBC
HCT, POC: 40.4 % (ref 37.7–47.9)
Hemoglobin: 14 g/dL (ref 12.2–16.2)
MCH, POC: 29.8 pg (ref 27–31.2)
MCHC: 34.7 g/dL (ref 31.8–35.4)
RBC: 4.7 M/uL (ref 4.04–5.48)

## 2013-04-28 MED ORDER — NYSTATIN-TRIAMCINOLONE 100000-0.1 UNIT/GM-% EX OINT: TOPICAL_OINTMENT | CUTANEOUS | Status: DC

## 2013-04-28 MED ORDER — ROSUVASTATIN CALCIUM 40 MG PO TABS: 20.0000 mg | ORAL_TABLET | Freq: Every day | ORAL | Status: DC

## 2013-04-28 MED ORDER — LEVOTHYROXINE SODIUM 175 MCG PO TABS: 175.0000 ug | ORAL_TABLET | Freq: Every day | ORAL | Status: DC

## 2013-04-28 MED ORDER — ATENOLOL 50 MG PO TABS: 50.0000 mg | ORAL_TABLET | Freq: Every day | ORAL | Status: DC

## 2013-04-28 MED ORDER — EZETIMIBE 10 MG PO TABS: 10.0000 mg | ORAL_TABLET | Freq: Every day | ORAL | Status: DC

## 2013-04-28 NOTE — Progress Notes (Signed)
  Subjective:    Patient ID: Miranda Wilson, female    DOB: Oct 17, 1951, 62 y.o.   MRN: [id number]  HPI    Review of Systems  Constitutional: Positive for fatigue.  HENT: Negative.   Eyes: Positive for itching (dryness).  Respiratory: Negative.   Cardiovascular: Positive for leg swelling (L>R).  Gastrointestinal: Positive for constipation (due to meds). Negative for abdominal pain.  Endocrine: Negative.   Genitourinary: Positive for frequency (totally incontinent).  Musculoskeletal: Positive for myalgias (bilateral calf ), back pain (cervical, R mid back) and arthralgias (R knee > L).  Skin: Positive for rash (frequent).  Psychiatric/Behavioral: Positive for sleep disturbance (to go to the restroom).       Objective:   Physical Exam BP 126/68  Pulse 54  Temp(Src) 97 F (36.1 C) (Oral)  Ht 5\' 3"  (1.6 m)  Wt 211 lb 9.6 oz (95.981 kg)  BMI 37.49 kg/m2  LMP 04/29/1991  The patient appeared well nourished and normally developed, alert and oriented to time and place. Speech, behavior and judgement appear normal. She was somewhat limited in her mobility due to her bilateral knee pain. Vital signs as documented.  Head exam is unremarkable. No scleral icterus or pallor noted.  Neck is without jugular venous distension, thyromegally, or carotid bruits. Carotid upstrokes are brisk bilaterally. No cervical adenopathy. Lungs are clear anteriorly and posteriorly to auscultation. Normal respiratory effort. Cardiac exam reveals regular rate and rhythm at 72 per minute. First and second heart sounds normal.  No murmurs, rubs or gallops.  Abdominal exam reveals normal bowl sounds, no masses, no organomegaly and no aortic enlargement. No inguinal adenopathy. There is some epigastric and suprapubic tenderness. There is obesity. Extremities are nonedematous and both  pedal pulses are normal. There is swelling of both knees with limited mobility. Skin without pallor or jaundice.  Warm and dry,  without rash. Neurologic exam reveals normal deep tendon reflexes and normal sensation.          Assessment & Plan:   1. Fatigue - POCT CBC; Standing - Thyroid Panel With TSH - BASIC METABOLIC PANEL WITH GFR; Standing  2. Vitamin D deficiency - Vitamin D 25 hydroxy; Standing  3. Hyperlipemia - Hepatic function panel; Standing - NMR Lipoprofile with Lipids; Standing  4. Hypertension  5. Osteoarthritis  6. Lumbar spondylosis with myelopathy  7. Incontinence of urine  8. Hypothyroidism  Patient Instructions  Fall precautions discussed Continue current meds and therapeutic lifestyle changes   Nyra Capes MD

## 2013-04-28 NOTE — Patient Instructions (Addendum)
Fall precautions discussed Continue current meds and therapeutic lifestyle changes 

## 2013-04-29 LAB — HEPATIC FUNCTION PANEL
AST: 25 U/L (ref 0–37)
Albumin: 4.2 g/dL (ref 3.5–5.2)
Alkaline Phosphatase: 91 U/L (ref 39–117)
Indirect Bilirubin: 0.5 mg/dL (ref 0.0–0.9)
Total Bilirubin: 0.6 mg/dL (ref 0.3–1.2)
Total Protein: 6.6 g/dL (ref 6.0–8.3)

## 2013-04-29 LAB — NMR LIPOPROFILE WITH LIPIDS
Cholesterol, Total: 159 mg/dL (ref <200)
LDL Particle Number: 1132 nmol/L — ABNORMAL HIGH (ref <1000)
Large HDL-P: 9.4 umol/L (ref >=4.8)
Large VLDL-P: 3.1 nmol/L — ABNORMAL HIGH (ref <=2.7)
Small LDL Particle Number: 450 nmol/L (ref <=527)
Triglycerides: 84 mg/dL (ref <150)
VLDL Size: 49 nm — ABNORMAL HIGH (ref <=46.6)

## 2013-04-29 LAB — BASIC METABOLIC PANEL WITH GFR
BUN: 17 mg/dL (ref 6–23)
CO2: 24 mEq/L (ref 19–32)
Calcium: 9.8 mg/dL (ref 8.4–10.5)
Creat: 1.03 mg/dL (ref 0.50–1.10)

## 2013-04-30 ENCOUNTER — Telehealth: Payer: Self-pay | Admitting: Family Medicine

## 2013-05-11 ENCOUNTER — Telehealth: Payer: Self-pay | Admitting: Family Medicine

## 2013-05-11 DIAGNOSIS — E785 Hyperlipidemia, unspecified: Secondary | ICD-10-CM

## 2013-05-13 MED ORDER — ROSUVASTATIN CALCIUM 40 MG PO TABS: 40.0000 mg | ORAL_TABLET | ORAL | Status: DC

## 2013-05-13 NOTE — Telephone Encounter (Signed)
Discussed results and recommendations. Samples of crestor 20mg  #35 placed up front. Script changed to crestor 40mg  take one po daily as directed # 30 with 6 refills.

## 2013-06-23 ENCOUNTER — Other Ambulatory Visit: Payer: Self-pay | Admitting: Family Medicine

## 2013-08-12 ENCOUNTER — Other Ambulatory Visit: Payer: Self-pay | Admitting: Obstetrics and Gynecology

## 2013-08-12 DIAGNOSIS — Z01419 Encounter for gynecological examination (general) (routine) without abnormal findings: Secondary | ICD-10-CM | POA: Diagnosis not present

## 2013-08-12 DIAGNOSIS — Z124 Encounter for screening for malignant neoplasm of cervix: Secondary | ICD-10-CM | POA: Diagnosis not present

## 2013-08-12 DIAGNOSIS — Z1231 Encounter for screening mammogram for malignant neoplasm of breast: Secondary | ICD-10-CM | POA: Diagnosis not present

## 2013-09-17 DIAGNOSIS — M1710 Unilateral primary osteoarthritis, unspecified knee: Secondary | ICD-10-CM | POA: Diagnosis not present

## 2013-11-09 ENCOUNTER — Encounter: Payer: Self-pay | Admitting: Internal Medicine

## 2013-11-18 DIAGNOSIS — M1710 Unilateral primary osteoarthritis, unspecified knee: Secondary | ICD-10-CM | POA: Diagnosis not present

## 2013-11-25 DIAGNOSIS — M1710 Unilateral primary osteoarthritis, unspecified knee: Secondary | ICD-10-CM | POA: Diagnosis not present

## 2013-12-03 DIAGNOSIS — M1710 Unilateral primary osteoarthritis, unspecified knee: Secondary | ICD-10-CM | POA: Diagnosis not present

## 2013-12-31 DIAGNOSIS — N950 Postmenopausal bleeding: Secondary | ICD-10-CM | POA: Diagnosis not present

## 2013-12-31 DIAGNOSIS — D261 Other benign neoplasm of corpus uteri: Secondary | ICD-10-CM | POA: Diagnosis not present

## 2014-03-27 ENCOUNTER — Encounter: Payer: Self-pay | Admitting: Internal Medicine

## 2014-04-08 DIAGNOSIS — M1710 Unilateral primary osteoarthritis, unspecified knee: Secondary | ICD-10-CM | POA: Diagnosis not present

## 2014-05-03 ENCOUNTER — Other Ambulatory Visit: Payer: Self-pay | Admitting: Family Medicine

## 2014-05-31 ENCOUNTER — Ambulatory Visit (INDEPENDENT_AMBULATORY_CARE_PROVIDER_SITE_OTHER): Payer: BC Managed Care – PPO

## 2014-05-31 ENCOUNTER — Encounter: Payer: Self-pay | Admitting: Family Medicine

## 2014-05-31 ENCOUNTER — Ambulatory Visit (INDEPENDENT_AMBULATORY_CARE_PROVIDER_SITE_OTHER): Payer: BC Managed Care – PPO | Admitting: Family Medicine

## 2014-05-31 VITALS — BP 150/72 | HR 48 | Temp 97.0°F | Ht 63.0 in | Wt 214.0 lb

## 2014-05-31 DIAGNOSIS — K219 Gastro-esophageal reflux disease without esophagitis: Secondary | ICD-10-CM | POA: Diagnosis not present

## 2014-05-31 DIAGNOSIS — E8881 Metabolic syndrome: Secondary | ICD-10-CM | POA: Diagnosis not present

## 2014-05-31 DIAGNOSIS — E559 Vitamin D deficiency, unspecified: Secondary | ICD-10-CM

## 2014-05-31 DIAGNOSIS — E785 Hyperlipidemia, unspecified: Secondary | ICD-10-CM

## 2014-05-31 DIAGNOSIS — E039 Hypothyroidism, unspecified: Secondary | ICD-10-CM | POA: Diagnosis not present

## 2014-05-31 DIAGNOSIS — R001 Bradycardia, unspecified: Secondary | ICD-10-CM | POA: Insufficient documentation

## 2014-05-31 DIAGNOSIS — M159 Polyosteoarthritis, unspecified: Secondary | ICD-10-CM

## 2014-05-31 DIAGNOSIS — I10 Essential (primary) hypertension: Secondary | ICD-10-CM

## 2014-05-31 DIAGNOSIS — I498 Other specified cardiac arrhythmias: Secondary | ICD-10-CM

## 2014-05-31 DIAGNOSIS — M150 Primary generalized (osteo)arthritis: Secondary | ICD-10-CM

## 2014-05-31 DIAGNOSIS — J301 Allergic rhinitis due to pollen: Secondary | ICD-10-CM

## 2014-05-31 DIAGNOSIS — Z13820 Encounter for screening for osteoporosis: Secondary | ICD-10-CM

## 2014-05-31 LAB — POCT CBC

## 2014-05-31 LAB — POCT GLYCOSYLATED HEMOGLOBIN (HGB A1C): Hemoglobin A1C: 5.4

## 2014-05-31 MED ORDER — GLUCOSE BLOOD VI STRP: ORAL_STRIP | Status: DC

## 2014-05-31 MED ORDER — ONETOUCH ULTRASOFT LANCETS MISC: Status: DC

## 2014-05-31 NOTE — Progress Notes (Signed)
Subjective:    Patient ID: Miranda Wilson, female    DOB: 08-23-1951, 63 y.o.   MRN: [id number]  HPI Pt here for follow up and management of chronic medical problems. The patient comes to the visit today complaining of some dizziness. She also had some nausea and this may have been associated with taking antibiotics for dental procedure. She's also concerned about a skin lesion on her 4. In addition she's had some increased thirst and vision changes. She requested all her medications be refilled.       Patient Active Problem List   Diagnosis Date Noted  . Hyperlipemia 04/28/2013  . Vitamin D deficiency 04/28/2013  . Lumbar spondylosis with myelopathy 04/28/2013  . Osteoarthritis 04/28/2013  . Hypertension 04/28/2013  . Incontinence of urine 04/28/2013  . Hypothyroidism 04/28/2013  . Cough 08/21/2011  . Abnormal CXR 08/21/2011   Outpatient Encounter Prescriptions as of 05/31/2014  Medication Sig  . aspirin 81 MG tablet Take 81 mg by mouth daily.    Marland Kitchen atenolol (TENORMIN) 50 MG tablet Take 1 tablet (50 mg total) by mouth daily.  . Calcium Carbonate-Vitamin D (CALTRATE 600+D) 600-400 MG-UNIT per tablet Take 1 tablet by mouth daily.  Sarajane Marek Sodium 30-100 MG CAPS Take 1 tablet by mouth daily.   . Cholecalciferol (VITAMIN D) 1000 UNITS capsule Take 2,000 Units by mouth daily. 2000iu daily except 4000iu on Sat and Sun  . levothyroxine (SYNTHROID, LEVOTHROID) 175 MCG tablet Take 1 tablet (175 mcg total) by mouth daily before breakfast.  . Multiple Vitamin (MULTIVITAMIN WITH MINERALS) TABS tablet Take 1 tablet by mouth daily.  . Polyethylene Glycol 3350 (MIRALAX PO) Take by mouth as needed.    . rosuvastatin (CRESTOR) 40 MG tablet Take 1 tablet (40 mg total) by mouth as directed.  Marland Kitchen ZETIA 10 MG tablet TAKE 1 TABLET (10 MG TOTAL) BY MOUTH DAILY.  . [DISCONTINUED] ciclopirox (PENLAC) 8 % solution   . [DISCONTINUED] clindamycin (CLEOCIN) 150 MG capsule   . [DISCONTINUED]  levothyroxine (SYNTHROID, LEVOTHROID) 175 MCG tablet Take 1 tablet (175 mcg total) by mouth daily.  . [DISCONTINUED] nystatin-triamcinolone ointment (MYCOLOG) Apply lightly twice daily  to affected rash as needed    Review of Systems  Constitutional: Negative.   HENT: Negative.        Thirsty, blurred vision at times  Eyes: Negative.   Respiratory: Negative.   Cardiovascular: Negative.   Gastrointestinal: Positive for nausea.  Endocrine: Negative.   Genitourinary: Negative.   Musculoskeletal: Negative.   Skin: Negative.   Allergic/Immunologic: Negative.   Neurological: Positive for dizziness.  Hematological: Negative.   Psychiatric/Behavioral: Negative.        Objective:   Physical Exam  Nursing note and vitals reviewed. Constitutional: She is oriented to person, place, and time. She appears well-developed and well-nourished. No distress.  The patient is pleasant and cooperative.  HENT:  Head: Normocephalic and atraumatic.  Right Ear: External ear normal.  Left Ear: External ear normal.  Mouth/Throat: Oropharynx is clear and moist.  There is nasal congestion and redness bilaterally.  Eyes: Conjunctivae and EOM are normal. Pupils are equal, round, and reactive to light. Right eye exhibits no discharge. Left eye exhibits no discharge. No scleral icterus.  Neck: Normal range of motion. Neck supple. No thyromegaly present.  There are no carotid bruits.  Cardiovascular: Regular rhythm, normal heart sounds and intact distal pulses.  Exam reveals no gallop and no friction rub.   No murmur heard.  The heart rhythm is regular  but bradycardic at about 48 per minute  Pulmonary/Chest: Effort normal and breath sounds normal. No respiratory distress. She has no wheezes. She has no rales. She exhibits no tenderness.  Abdominal: Soft. Bowel sounds are normal. She exhibits no mass. There is no tenderness. There is no rebound and no guarding.  The abdomen is obese and there is slight  epigastric tenderness  Musculoskeletal: She exhibits no edema and no tenderness.  The patient uses a cane for ambulation because of stiffness and pain in both knees. She is status post a left knee replacement and has a lot of scar tissue secondary to a knee replacement.  Lymphadenopathy:    She has no cervical adenopathy.  Neurological: She is alert and oriented to person, place, and time. She has normal reflexes. No cranial nerve deficit.  Skin: Skin is warm and dry. No rash noted.  The patient has what appears to be a seborrheic keratosis on her left upper forehead   Psychiatric: She has a normal mood and affect. Her behavior is normal. Judgment and thought content normal.   BP 150/72  Pulse 48  Temp(Src) 97 F (36.1 C) (Oral)  Ht '5\' 3"'  (1.6 m)  Wt 214 lb (97.07 kg)  BMI 37.92 kg/m2  LMP 04/29/1991  [facility] reading (PRIMARY) by  Dr.Jezebel Pollet-chest x-ray-no active disease                                        Assessment & Plan:  1. Vitamin D deficiency - POCT CBC - Vit D  25 hydroxy (rtn osteoporosis monitoring)  2. Hypothyroidism, unspecified hypothyroidism type - POCT CBC - Thyroid Panel With TSH  3. Essential hypertension - POCT CBC - BMP8+EGFR - Hepatic function panel  4. Hyperlipemia - POCT CBC - NMR, lipoprofile - DG Chest 2 View; Future  5. Metabolic syndrome - POCT CBC - POCT glycosylated hemoglobin (Hb A1C)  6. Screening for osteoporosis - DG Bone Density; Future  7. Gastroesophageal reflux disease, esophagitis presence not specified  8. Allergic rhinitis due to pollen  9. Primary osteoarthritis involving multiple joints  10. Bradycardia  Meds ordered this encounter  Medications  . Calcium Carbonate-Vitamin D (CALTRATE 600+D) 600-400 MG-UNIT per tablet    Sig: Take 1 tablet by mouth daily.  . Multiple Vitamin (MULTIVITAMIN WITH MINERALS) TABS tablet    Sig: Take 1 tablet by mouth daily.  Marland Kitchen DISCONTD: glucose blood test strip    Sig: Check BS daily  and PRN    Dispense:  100 each    Refill:  11  . Lancets (ONETOUCH ULTRASOFT) lancets    Sig: Check BS daily and PRN. Dx 250.0    Dispense:  100 each    Refill:  11  . glucose blood test strip    Sig: Check BS daily and PRN.dx 250.0    Dispense:  100 each    Refill:  11   Patient Instructions                       Medicare Annual Wellness Visit  [facility] and the medical providers at [facility] strive to bring you the best medical care.  In doing so we not only want to address your current medical conditions and concerns but also to detect new conditions early and prevent illness, disease and health-related problems.    Medicare offers a yearly  Wellness Visit which allows our clinical staff to assess your need for preventative services including immunizations, lifestyle education, counseling to decrease risk of preventable diseases and screening for fall risk and other medical concerns.    This visit is provided free of charge (no copay) for all Medicare recipients. The clinical pharmacists at [facility] have begun to conduct these Wellness Visits which will also include a thorough review of all your medications.    As you primary medical provider recommend that you make an appointment for your Annual Wellness Visit if you have not done so already this year.  You may set up this appointment before you leave today or you may call back ([phone]) and schedule an appointment.  Please make sure when you call that you mention that you are scheduling your Annual Wellness Visit with the clinical pharmacist so that the appointment may be made for the proper length of time.       Continue current medications. Continue good therapeutic lifestyle changes which include good diet and exercise. Fall precautions discussed with patient. If an FOBT was given today- please return it to our front desk. If you are over 47 years old - you may need Prevnar 15  or the adult Pneumonia vaccine.  Continue to followup with the orthopedic surgeon Drink as much water as possible  Monitor blood pressure with omron blood pressure monitor Monitor blood sugars as directed with flowsheets from the office Bring blood pressures and blood sugars by 4 reviewed in 4-5 weeks, associate any symptoms like dizziness or blurred vision with any readings home blood sugars or blood pressures Continue to exercise as tolerated You should make an appointment for the lesion on your 4 head for treatment by the dermatology Dr. Dennis Bast can purchase Zantac or ranitidine 150 over-the-counter one twice daily before breakfast and supper You can also purchase Flonase over-the-counter one spray each nostril at bedtime for allergic rhinitis   Arrie Senate MD

## 2014-05-31 NOTE — Patient Instructions (Addendum)
Medicare Annual Wellness Visit  [facility] and the medical providers at [facility] strive to bring you the best medical care.  In doing so we not only want to address your current medical conditions and concerns but also to detect new conditions early and prevent illness, disease and health-related problems.    Medicare offers a yearly Wellness Visit which allows our clinical staff to assess your need for preventative services including immunizations, lifestyle education, counseling to decrease risk of preventable diseases and screening for fall risk and other medical concerns.    This visit is provided free of charge (no copay) for all Medicare recipients. The clinical pharmacists at [facility] have begun to conduct these Wellness Visits which will also include a thorough review of all your medications.    As you primary medical provider recommend that you make an appointment for your Annual Wellness Visit if you have not done so already this year.  You may set up this appointment before you leave today or you may call back ([phone]) and schedule an appointment.  Please make sure when you call that you mention that you are scheduling your Annual Wellness Visit with the clinical pharmacist so that the appointment may be made for the proper length of time.       Continue current medications. Continue good therapeutic lifestyle changes which include good diet and exercise. Fall precautions discussed with patient. If an FOBT was given today- please return it to our front desk. If you are over 32 years old - you may need Prevnar 11 or the adult Pneumonia vaccine.  Continue to followup with the orthopedic surgeon Drink as much water as possible  Monitor blood pressure with omron blood pressure monitor Monitor blood sugars as directed with flowsheets from the office Bring blood pressures and blood sugars by 4 reviewed in 4-5  weeks, associate any symptoms like dizziness or blurred vision with any readings home blood sugars or blood pressures Continue to exercise as tolerated You should make an appointment for the lesion on your 4 head for treatment by the dermatology Dr. Dennis Bast can purchase Zantac or ranitidine 150 over-the-counter one twice daily before breakfast and supper You can also purchase Flonase over-the-counter one spray each nostril at bedtime for allergic rhinitis

## 2014-06-01 ENCOUNTER — Telehealth: Payer: Self-pay

## 2014-06-01 LAB — BMP8+EGFR
BUN/Creatinine Ratio: 17 (ref 11–26)
BUN: 16 mg/dL (ref 8–27)
CHLORIDE: 99 mmol/L (ref 97–108)
CO2: 27 mmol/L (ref 18–29)
Calcium: 9.4 mg/dL (ref 8.7–10.3)
Creatinine, Ser: 0.92 mg/dL (ref 0.57–1.00)
GFR calc Af Amer: 76 mL/min/{1.73_m2} (ref >59)
GFR calc non Af Amer: 66 mL/min/{1.73_m2} (ref >59)
GLUCOSE: 82 mg/dL (ref 65–99)
Potassium: 4.4 mmol/L (ref 3.5–5.2)
Sodium: 140 mmol/L (ref 134–144)

## 2014-06-01 LAB — HEPATIC FUNCTION PANEL
ALT: 20 IU/L (ref 0–32)
AST: 24 IU/L (ref 0–40)
Albumin: 4.5 g/dL (ref 3.6–4.8)
Alkaline Phosphatase: 93 IU/L (ref 39–117)
Bilirubin, Direct: 0.17 mg/dL (ref 0.00–0.40)
TOTAL PROTEIN: 6.7 g/dL (ref 6.0–8.5)
Total Bilirubin: 0.5 mg/dL (ref 0.0–1.2)

## 2014-06-01 LAB — NMR, LIPOPROFILE
Cholesterol: 186 mg/dL (ref 100–199)
HDL Cholesterol by NMR: 76 mg/dL (ref >39)
HDL Particle Number: 45.2 umol/L (ref >=30.5)
LDL PARTICLE NUMBER: 1210 nmol/L — AB (ref <1000)
LDL SIZE: 21 nm (ref >20.5)
LDLC SERPL CALC-MCNC: 92 mg/dL (ref 0–99)
LP-IR Score: 49 — ABNORMAL HIGH (ref <=45)
Small LDL Particle Number: 585 nmol/L — ABNORMAL HIGH (ref <=527)
TRIGLYCERIDES BY NMR: 89 mg/dL (ref 0–149)

## 2014-06-01 LAB — THYROID PANEL WITH TSH
Free Thyroxine Index: 3.5 (ref 1.2–4.9)
T3 UPTAKE RATIO: 27 % (ref 24–39)
T4 TOTAL: 13.1 ug/dL — AB (ref 4.5–12.0)
TSH: 0.376 u[IU]/mL — ABNORMAL LOW (ref 0.450–4.500)

## 2014-06-01 LAB — VITAMIN D 25 HYDROXY (VIT D DEFICIENCY, FRACTURES): Vit D, 25-Hydroxy: 26.9 ng/mL — ABNORMAL LOW (ref 30.0–100.0)

## 2014-06-01 NOTE — Telephone Encounter (Signed)
Pt aware of CXR results.

## 2014-06-01 NOTE — Telephone Encounter (Signed)
Message copied by Koren Bound on Tue Jun 01, 2014  8:54 AM ------      Message from: Chipper Herb      Created: Mon May 31, 2014  3:27 PM       As per radiology report ------

## 2014-06-02 ENCOUNTER — Telehealth: Payer: Self-pay | Admitting: Family Medicine

## 2014-06-02 NOTE — Telephone Encounter (Signed)
Message copied by Waverly Ferrari on Wed Jun 02, 2014  9:45 AM ------      Message from: Chipper Herb      Created: Tue Jun 01, 2014  7:19 AM       The blood sugar is good at 82. The creatinine, the most important kidney function tests is within normal limits. The electrolytes including potassium are within normal limits.      All liver function tests are within normal limits      With advanced lipid testing, the total LDL particle number is elevated at 1210. One year ago was 1132. The LDL C. is at goal less than 192. The triglycerides are good.------ continue Crestor and has aggressive therapeutic lifestyle changes as possible. Make sure that this patient has a coupon car to get her Crestor cheaper. This would be $3 for 3 months.+++++      The TSH is slightly low. This could mean that she is getting a little too much thyroid medication. The T4 is slightly elevated.----- we will make no changes in her medication at this point in time. Have her come by in 6 weeks and get another thyroid profile. She does not have to be fasting.++++      Vitamin D level is low.----Please call in a prescription for vitamin D 50,000 units one weekly for 12 weeks with one refill. She should have a vitamin D level repeated in 3 months ------

## 2014-06-09 ENCOUNTER — Telehealth: Payer: Self-pay | Admitting: Family Medicine

## 2014-06-11 ENCOUNTER — Other Ambulatory Visit: Payer: Self-pay | Admitting: *Deleted

## 2014-06-11 DIAGNOSIS — E039 Hypothyroidism, unspecified: Secondary | ICD-10-CM

## 2014-06-11 MED ORDER — VITAMIN D (ERGOCALCIFEROL) 1.25 MG (50000 UNIT) PO CAPS: 50000.0000 [IU] | ORAL_CAPSULE | ORAL | Status: DC

## 2014-06-11 NOTE — Telephone Encounter (Signed)
Labs phoned to pt

## 2014-06-12 ENCOUNTER — Other Ambulatory Visit: Payer: Self-pay | Admitting: Family Medicine

## 2014-06-15 ENCOUNTER — Ambulatory Visit (INDEPENDENT_AMBULATORY_CARE_PROVIDER_SITE_OTHER): Payer: BC Managed Care – PPO | Admitting: Family Medicine

## 2014-06-15 ENCOUNTER — Ambulatory Visit (INDEPENDENT_AMBULATORY_CARE_PROVIDER_SITE_OTHER): Payer: BC Managed Care – PPO

## 2014-06-15 ENCOUNTER — Encounter: Payer: Self-pay | Admitting: Family Medicine

## 2014-06-15 VITALS — BP 119/70 | HR 47 | Temp 96.8°F | Ht 63.0 in | Wt 218.0 lb

## 2014-06-15 DIAGNOSIS — M25519 Pain in unspecified shoulder: Secondary | ICD-10-CM | POA: Diagnosis not present

## 2014-06-15 DIAGNOSIS — G589 Mononeuropathy, unspecified: Secondary | ICD-10-CM | POA: Diagnosis not present

## 2014-06-15 DIAGNOSIS — G629 Polyneuropathy, unspecified: Secondary | ICD-10-CM

## 2014-06-15 DIAGNOSIS — M47812 Spondylosis without myelopathy or radiculopathy, cervical region: Secondary | ICD-10-CM | POA: Diagnosis not present

## 2014-06-15 DIAGNOSIS — M542 Cervicalgia: Secondary | ICD-10-CM

## 2014-06-15 DIAGNOSIS — M25511 Pain in right shoulder: Secondary | ICD-10-CM

## 2014-06-15 MED ORDER — NABUMETONE 500 MG PO TABS: 500.0000 mg | ORAL_TABLET | Freq: Two times a day (BID) | ORAL | Status: DC | PRN

## 2014-06-15 MED ORDER — NABUMETONE 750 MG PO TABS: 750.0000 mg | ORAL_TABLET | Freq: Two times a day (BID) | ORAL | Status: DC | PRN

## 2014-06-15 MED ORDER — METAXALONE 800 MG PO TABS: 800.0000 mg | ORAL_TABLET | Freq: Three times a day (TID) | ORAL | Status: DC

## 2014-06-15 NOTE — Progress Notes (Addendum)
Subjective:    Patient ID: Miranda Wilson, female    DOB: 03-07-1951, 63 y.o.   MRN: [id number]  HPI Patient here today for neck pain and left shoulder pain. This has been getting worse over the last week. She had problems with this in the past and saw the neurosurgeon and with physical therapy and traction she did get improvement.         Patient Active Problem List   Diagnosis Date Noted  . Metabolic syndrome 01/74/9449  . Bradycardia 05/31/2014  . Hyperlipemia 04/28/2013  . Vitamin D deficiency 04/28/2013  . Lumbar spondylosis with myelopathy 04/28/2013  . Osteoarthritis 04/28/2013  . Hypertension 04/28/2013  . Incontinence of urine 04/28/2013  . Hypothyroidism 04/28/2013  . Cough 08/21/2011  . Abnormal CXR 08/21/2011   Outpatient Encounter Prescriptions as of 06/15/2014  Medication Sig  . aspirin 81 MG tablet Take 81 mg by mouth daily.    Marland Kitchen atenolol (TENORMIN) 50 MG tablet TAKE 1 TABLET (50 MG TOTAL) BY MOUTH DAILY.  . Calcium Carbonate-Vitamin D (CALTRATE 600+D) 600-400 MG-UNIT per tablet Take 1 tablet by mouth daily.  Sarajane Marek Sodium 30-100 MG CAPS Take 1 tablet by mouth daily.   Marland Kitchen glucose blood test strip Check BS daily and PRN.dx 250.0  . Lancets (ONETOUCH ULTRASOFT) lancets Check BS daily and PRN. Dx 250.0  . levothyroxine (SYNTHROID, LEVOTHROID) 175 MCG tablet Take 1 tablet (175 mcg total) by mouth daily before breakfast.  . Multiple Vitamin (MULTIVITAMIN WITH MINERALS) TABS tablet Take 1 tablet by mouth daily.  . Polyethylene Glycol 3350 (MIRALAX PO) Take by mouth as needed.    . rosuvastatin (CRESTOR) 40 MG tablet Take 1 tablet (40 mg total) by mouth as directed.  . Vitamin D, Ergocalciferol, (DRISDOL) 50000 UNITS CAPS capsule Take 1 capsule (50,000 Units total) by mouth every 7 (seven) days.  Marland Kitchen ZETIA 10 MG tablet TAKE 1 TABLET (10 MG TOTAL) BY MOUTH DAILY.  . [DISCONTINUED] Cholecalciferol (VITAMIN D) 1000 UNITS capsule Take 2,000 Units by mouth  daily. 2000iu daily except 4000iu on Sat and Sun    Review of Systems  Constitutional: Negative.   HENT: Negative.   Eyes: Negative.   Respiratory: Negative.   Cardiovascular: Negative.   Gastrointestinal: Negative.   Endocrine: Negative.   Genitourinary: Negative.   Musculoskeletal: Positive for neck pain. Arthralgias: right shoulder pain.  Skin: Negative.   Allergic/Immunologic: Negative.   Neurological: Negative.   Hematological: Negative.   Psychiatric/Behavioral: Negative.        Objective:   Physical Exam  Nursing note and vitals reviewed. Constitutional: She is oriented to person, place, and time. She appears well-developed and well-nourished. She appears distressed.  HENT:  Head: Normocephalic and atraumatic.  Eyes: Conjunctivae and EOM are normal. Pupils are equal, round, and reactive to light. Right eye exhibits no discharge. Left eye exhibits no discharge. No scleral icterus.  Neck: Normal range of motion. Neck supple.  Cardiovascular:  Intact radial pulses bilaterally  Abdominal: Bowel sounds are normal.  Musculoskeletal: She exhibits tenderness. She exhibits no edema.  Limited range of motion of the neck secondary to pain in neck and upper back  Lymphadenopathy:    She has no cervical adenopathy.  Neurological: She is alert and oriented to person, place, and time. She has normal reflexes. No cranial nerve deficit.  Skin: Skin is warm and dry. No rash noted.  Psychiatric: She has a normal mood and affect. Her behavior is normal. Judgment and thought content normal.  BP 119/70  Pulse 47  Temp(Src) 96.8 F (36 C) (Oral)  Ht 5\' 3"  (1.6 m)  Wt 218 lb (98.884 kg)  BMI 38.63 kg/m2  LMP 04/29/1991  [facility] reading (PRIMARY) by  Dr. Shiela Mayer changes with disc space narrowing at C6 and C7 and spurring                                       Assessment & Plan:  1. Neck pain - DG Cervical Spine Complete; Future  2.Left shoulder pain - DG Cervical  Spine Complete; Future  3. Osteoarthritis cervical spine  4. Neuropathy  Meds ordered this encounter  Medications  . DISCONTD: nabumetone (RELAFEN) 750 MG tablet    Sig: Take 1 tablet (750 mg total) by mouth 2 (two) times daily as needed.    Dispense:  60 tablet    Refill:  1  . metaxalone (SKELAXIN) 800 MG tablet    Sig: Take 1 tablet (800 mg total) by mouth 3 (three) times daily.    Dispense:  30 tablet    Refill:  1  . nabumetone (RELAFEN) 500 MG tablet    Sig: Take 1 tablet (500 mg total) by mouth 2 (two) times daily as needed.    Dispense:  60 tablet    Refill:  1   Patient Instructions  Continue with warm wet compresses 20 minutes 4 or 5 times daily Take medication as directed We will arrange for an MRI if you are not better in 3 days We will subsequently schedule you for a visit with the neurosurgeon for possible injections   Arrie Senate MD

## 2014-06-15 NOTE — Patient Instructions (Signed)
Continue with warm wet compresses 20 minutes 4 or 5 times daily Take medication as directed We will arrange for an MRI if you are not better in 3 days We will subsequently schedule you for a visit with the neurosurgeon for possible injections

## 2014-06-17 ENCOUNTER — Telehealth: Payer: Self-pay

## 2014-06-17 NOTE — Telephone Encounter (Signed)
X ray results 

## 2014-06-18 ENCOUNTER — Telehealth: Payer: Self-pay | Admitting: Family Medicine

## 2014-06-18 DIAGNOSIS — M542 Cervicalgia: Secondary | ICD-10-CM

## 2014-06-18 DIAGNOSIS — M25512 Pain in left shoulder: Secondary | ICD-10-CM

## 2014-06-18 NOTE — Telephone Encounter (Signed)
Get cervical MRI and see if the patient is willing to try a short course of prednisone as discussed with her the other day plus an IM injection of 40 mg , if she does the prednisone she would have to stop her NSAID.

## 2014-06-18 NOTE — Telephone Encounter (Signed)
Pt does not want prednisone at this time MRI - ordered

## 2014-06-18 NOTE — Telephone Encounter (Signed)
Pt not better- should we go on and arrange cervical MRI?

## 2014-06-24 ENCOUNTER — Ambulatory Visit (HOSPITAL_COMMUNITY)
Admission: RE | Admit: 2014-06-24 | Discharge: 2014-06-24 | Disposition: A | Discharge status: Home / Self Care | Payer: BC Managed Care – PPO | Source: Ambulatory Visit | Attending: Family Medicine | Admitting: Family Medicine

## 2014-06-24 ENCOUNTER — Telehealth: Payer: Self-pay

## 2014-06-24 DIAGNOSIS — M47812 Spondylosis without myelopathy or radiculopathy, cervical region: Secondary | ICD-10-CM | POA: Diagnosis not present

## 2014-06-24 DIAGNOSIS — R29890 Loss of height: Secondary | ICD-10-CM | POA: Insufficient documentation

## 2014-06-24 DIAGNOSIS — M542 Cervicalgia: Secondary | ICD-10-CM | POA: Insufficient documentation

## 2014-06-24 DIAGNOSIS — M25512 Pain in left shoulder: Secondary | ICD-10-CM

## 2014-06-24 DIAGNOSIS — M5380 Other specified dorsopathies, site unspecified: Secondary | ICD-10-CM | POA: Diagnosis not present

## 2014-06-24 DIAGNOSIS — M4802 Spinal stenosis, cervical region: Secondary | ICD-10-CM | POA: Diagnosis not present

## 2014-06-24 NOTE — Telephone Encounter (Signed)
Pt aware of MRI results 

## 2014-06-24 NOTE — Telephone Encounter (Signed)
Message copied by Koren Bound on Thu Jun 24, 2014  2:32 PM ------      Message from: Chipper Herb      Created: Thu Jun 24, 2014  1:18 PM       Because of the degree of pain this patient is having both on the right and left sides of her neck and the inability to control this with anti-inflammatory medicines, this patient needs a referral to Dr. Joya Salm for further evaluation as soon as possible. The appointment in the next week would have to be in the morning, after the next week it can be anytime of the day. Please try to get this appointment as soon as possible ------

## 2014-07-06 ENCOUNTER — Telehealth: Payer: Self-pay

## 2014-07-06 NOTE — Telephone Encounter (Signed)
Patient said she has been waiting on a referral to Neurosurgeon Dr Joya Salm.  No referral put in for this patient

## 2014-07-06 NOTE — Telephone Encounter (Signed)
Please do this referral as soon as possible i.e. tomorrow. She has been here several times and the referral was put in on followup from that first visit.

## 2014-07-07 ENCOUNTER — Ambulatory Visit (INDEPENDENT_AMBULATORY_CARE_PROVIDER_SITE_OTHER): Payer: BC Managed Care – PPO

## 2014-07-07 ENCOUNTER — Encounter: Payer: Self-pay | Admitting: Pharmacist

## 2014-07-07 ENCOUNTER — Ambulatory Visit (INDEPENDENT_AMBULATORY_CARE_PROVIDER_SITE_OTHER): Payer: BC Managed Care – PPO | Admitting: Pharmacist

## 2014-07-07 ENCOUNTER — Other Ambulatory Visit: Payer: BC Managed Care – PPO

## 2014-07-07 VITALS — Ht 63.0 in | Wt 217.0 lb

## 2014-07-07 DIAGNOSIS — E559 Vitamin D deficiency, unspecified: Secondary | ICD-10-CM | POA: Diagnosis not present

## 2014-07-07 DIAGNOSIS — M4716 Other spondylosis with myelopathy, lumbar region: Secondary | ICD-10-CM | POA: Diagnosis not present

## 2014-07-07 DIAGNOSIS — Z1212 Encounter for screening for malignant neoplasm of rectum: Secondary | ICD-10-CM

## 2014-07-07 DIAGNOSIS — Z13820 Encounter for screening for osteoporosis: Secondary | ICD-10-CM

## 2014-07-07 NOTE — Progress Notes (Signed)
Patient ID: Miranda Wilson, female   DOB: [birth year], 63 y.o.   MRN: [id number] [facility] Current Height: Height: 5\' 3"  (160 cm)      Max Lifetime Height:  66" Current Weight: Weight: 217 lb (98.431 kg)       Ethnicity:Caucasian    HPI: Does pt already have a diagnosis of:  Osteopenia?  No Osteoporosis?  No  Back Pain?  Yes       Kyphosis?  No Prior fracture?  No Med(s) for Osteoporosis/Osteopenia:  none Med(s) previously tried for Osteoporosis/Osteopenia:  noen                                                             PMH: Age at menopause:  63 yo Hysterectomy?  No Oophorectomy?  No HRT? Yes - Former.  Type/duration: prempro - for 10 years Steroid Use?  No Thyroid med?  Yes History of cancer?  No History of digestive disorders (ie Crohn's)?  No Current or previous eating disorders?  No Last Vitamin D Result:  26.9 (05/31/2014) Last GFR Result:  66 (05/31/2014)   FH/SH: Family history of osteoporosis?  No Parent with history of hip fracture?  No Family history of breast cancer?  No Exercise? A little with [employer] on line exercise Smoking?  No Alcohol?  No    Calcium Assessment Calcium Intake  # of servings/day  Calcium mg  Milk (8 oz) 0.5  x  300  = 150mg   Yogurt (4 oz) 0 x  200 = 0  Cheese (1 oz) 0 x  200 = 0  Other Calcium sources   250mg   Ca supplement 1000mg  = 1000mg    Estimated calcium intake per day 1400mg     DEXA Results Date of Test T-Score for AP Spine L1-L4 T-Score for Total Left Hip T-Score for Total Right Hip  07/07/2014 1.6 0.5 0.8  03/26/2012 0.8 0.5 0.6              Assessment: Screening - normal BMD Low vitamin D - recent increase in vitamin D supplement to 50,000IU weekly  Recommendations: 1.  Discussed BMD results and fracture risk 2.  continue calcium 1200mg  daily through supplementation or diet.  3.  recommend weight bearing exercise - 30 minutes at least 4 days per week - if cleared by neurosurgeon. 4.  Counseled and  educated about fall risk and prevention.  Recheck DEXA:  2 years  Time spent counseling patient:  20 minutes  Cherre Robins, PharmD, CPP

## 2014-07-07 NOTE — Patient Instructions (Signed)

## 2014-07-08 ENCOUNTER — Other Ambulatory Visit: Payer: Self-pay

## 2014-07-08 DIAGNOSIS — M549 Dorsalgia, unspecified: Secondary | ICD-10-CM

## 2014-07-09 LAB — FECAL OCCULT BLOOD, IMMUNOCHEMICAL: FECAL OCCULT BLD: NEGATIVE

## 2014-07-16 ENCOUNTER — Encounter (INDEPENDENT_AMBULATORY_CARE_PROVIDER_SITE_OTHER): Payer: Self-pay

## 2014-07-16 ENCOUNTER — Other Ambulatory Visit: Payer: BC Managed Care – PPO

## 2014-07-16 ENCOUNTER — Encounter: Payer: Self-pay | Admitting: Internal Medicine

## 2014-07-16 DIAGNOSIS — E039 Hypothyroidism, unspecified: Secondary | ICD-10-CM

## 2014-07-16 NOTE — Progress Notes (Signed)
Lab only 

## 2014-07-17 LAB — THYROID PANEL WITH TSH
Free Thyroxine Index: 4 (ref 1.2–4.9)
T3 Uptake Ratio: 28 % (ref 24–39)
T4 TOTAL: 14.4 ug/dL — AB (ref 4.5–12.0)
TSH: 0.038 u[IU]/mL — ABNORMAL LOW (ref 0.450–4.500)

## 2014-07-29 DIAGNOSIS — M5412 Radiculopathy, cervical region: Secondary | ICD-10-CM | POA: Diagnosis not present

## 2014-07-29 DIAGNOSIS — Z6838 Body mass index (BMI) 38.0-38.9, adult: Secondary | ICD-10-CM | POA: Diagnosis not present

## 2014-08-12 ENCOUNTER — Other Ambulatory Visit: Payer: Self-pay | Admitting: Nurse Practitioner

## 2014-09-01 ENCOUNTER — Other Ambulatory Visit: Payer: Self-pay | Admitting: Family Medicine

## 2014-09-14 ENCOUNTER — Telehealth: Payer: Self-pay | Admitting: Family Medicine

## 2014-09-15 DIAGNOSIS — M1711 Unilateral primary osteoarthritis, right knee: Secondary | ICD-10-CM | POA: Diagnosis not present

## 2014-10-04 ENCOUNTER — Ambulatory Visit (INDEPENDENT_AMBULATORY_CARE_PROVIDER_SITE_OTHER): Payer: BC Managed Care – PPO | Admitting: Family Medicine

## 2014-10-04 ENCOUNTER — Encounter: Payer: Self-pay | Admitting: Family Medicine

## 2014-10-04 VITALS — BP 152/73 | HR 68 | Temp 97.2°F | Ht 63.0 in | Wt 214.6 lb

## 2014-10-04 DIAGNOSIS — R110 Nausea: Secondary | ICD-10-CM | POA: Diagnosis not present

## 2014-10-04 DIAGNOSIS — E038 Other specified hypothyroidism: Secondary | ICD-10-CM | POA: Diagnosis not present

## 2014-10-04 DIAGNOSIS — E8881 Metabolic syndrome: Secondary | ICD-10-CM

## 2014-10-04 DIAGNOSIS — E785 Hyperlipidemia, unspecified: Secondary | ICD-10-CM

## 2014-10-04 DIAGNOSIS — E559 Vitamin D deficiency, unspecified: Secondary | ICD-10-CM | POA: Diagnosis not present

## 2014-10-04 DIAGNOSIS — R42 Dizziness and giddiness: Secondary | ICD-10-CM

## 2014-10-04 DIAGNOSIS — I10 Essential (primary) hypertension: Secondary | ICD-10-CM | POA: Diagnosis not present

## 2014-10-04 DIAGNOSIS — R5383 Other fatigue: Secondary | ICD-10-CM | POA: Diagnosis not present

## 2014-10-04 LAB — POCT CBC
Granulocyte percent: 61.2 %G (ref 37–80)
HCT, POC: 42.8 % (ref 37.7–47.9)
HEMOGLOBIN: 14.1 g/dL (ref 12.2–16.2)
Lymph, poc: 1.7 (ref 0.6–3.4)
MCH, POC: 29.3 pg (ref 27–31.2)
MCHC: 32.8 g/dL (ref 31.8–35.4)
MCV: 89.2 fL (ref 80–97)
MPV: 6.7 fL (ref 0–99.8)
POC GRANULOCYTE: 3.5 (ref 2–6.9)
POC LYMPH %: 29.3 % (ref 10–50)
Platelet Count, POC: 196 10*3/uL (ref 142–424)
RBC: 4.8 M/uL (ref 4.04–5.48)
RDW, POC: 13.9 %
WBC: 5.8 10*3/uL (ref 4.6–10.2)

## 2014-10-04 NOTE — Progress Notes (Signed)
Subjective:    Patient ID: Miranda Wilson, female    DOB: August 10, 1951, 63 y.o.   MRN: [id number]  HPI Pt is here today for follow up of chronic medical problems which include hypothyroidism, hypertension, hyperlipidemia, Vit D deficiency.  She is also having some abnormal vaginal bleeding. The patient has seen the gynecologist for this in the past and is planning on scheduling another visit for this as soon as possible. She gives the history that her maternal grandmother had a history of uterine cancer. She also complains of these episodes of dizziness and nausea and she had one of these episodes this past week. This is the fourth or fifth at such episode over the past couple of years. The last previous episode was in July of the summer. She also plans to get an eye exam. She will get blood work done today.       Patient Active Problem List   Diagnosis Date Noted  . Metabolic syndrome 60/15/6153  . Bradycardia 05/31/2014  . Hyperlipemia 04/28/2013  . Vitamin D deficiency 04/28/2013  . Lumbar spondylosis with myelopathy 04/28/2013  . Osteoarthritis 04/28/2013  . Hypertension 04/28/2013  . Incontinence of urine 04/28/2013  . Hypothyroidism 04/28/2013  . Cough 08/21/2011  . Abnormal CXR 08/21/2011   Outpatient Encounter Prescriptions as of 10/04/2014  Medication Sig  . aspirin 81 MG tablet Take 81 mg by mouth daily.    Marland Kitchen atenolol (TENORMIN) 50 MG tablet TAKE 1 TABLET (50 MG TOTAL) BY MOUTH DAILY.  . Calcium Carbonate-Vitamin D (CALTRATE 600+D) 600-400 MG-UNIT per tablet Take 1 tablet by mouth daily.  Sarajane Marek Sodium 30-100 MG CAPS Take 1 tablet by mouth daily as needed.   Marland Kitchen glucose blood test strip Check BS daily and PRN.dx 250.0  . Lancets (ONETOUCH ULTRASOFT) lancets Check BS daily and PRN. Dx 250.0  . levothyroxine (SYNTHROID, LEVOTHROID) 175 MCG tablet Take 1 tablet (175 mcg total) by mouth daily before breakfast.  . metaxalone (SKELAXIN) 800 MG tablet Take 1 tablet  (800 mg total) by mouth 3 (three) times daily.  . Multiple Vitamin (MULTIVITAMIN WITH MINERALS) TABS tablet Take 1 tablet by mouth daily.  . nabumetone (RELAFEN) 500 MG tablet Take 1 tablet (500 mg total) by mouth 2 (two) times daily as needed.  . Omega-3 Fatty Acids (FISH OIL) 1000 MG CAPS Take 1 capsule by mouth daily.  . Polyethylene Glycol 3350 (MIRALAX PO) Take by mouth as needed.    . rosuvastatin (CRESTOR) 40 MG tablet Take 20 mg by mouth as directed.  Marland Kitchen ZETIA 10 MG tablet TAKE 1 TABLET EVERY DAY  . Vitamin D, Ergocalciferol, (DRISDOL) 50000 UNITS CAPS capsule Take 1 capsule (50,000 Units total) by mouth every 7 (seven) days. (Patient not taking: Reported on 10/04/2014)  . [DISCONTINUED] levothyroxine (SYNTHROID, LEVOTHROID) 175 MCG tablet TAKE 1 TABLET (175 MCG TOTAL) BY MOUTH DAILY. (Patient not taking: Reported on 10/04/2014)            Review of Systems  Constitutional: Positive for fatigue.  HENT: Negative.   Eyes: Negative.   Respiratory: Negative.   Gastrointestinal: Negative.   Genitourinary: Negative for dysuria and frequency. Vaginal bleeding: started on Saturday.  Neurological: Negative.   Psychiatric/Behavioral: Negative.        Objective:   Physical Exam  Constitutional: She is oriented to person, place, and time. She appears well-developed and well-nourished. No distress.  The patient is kind alert and comes to the visit today using a cane and because of her  ongoing knee problems.  HENT:  Head: Normocephalic and atraumatic.  Right Ear: External ear normal.  Left Ear: External ear normal.  Mouth/Throat: Oropharynx is clear and moist. No oropharyngeal exudate.  Minimal nasal congestion  Eyes: Conjunctivae and EOM are normal. Pupils are equal, round, and reactive to light. Right eye exhibits no discharge. Left eye exhibits no discharge. No scleral icterus.  Neck: Normal range of motion. Neck supple. No JVD present. No thyromegaly present.  No carotid bruits  or anterior cervical adenopathy  Cardiovascular: Normal rate, regular rhythm, normal heart sounds and intact distal pulses.   No murmur heard. The heart has a regular rate and rhythm at 60/m  Pulmonary/Chest: Effort normal and breath sounds normal. No respiratory distress. She has no wheezes. She has no rales. She exhibits no tenderness.  Abdominal: Soft. Bowel sounds are normal. She exhibits no mass. There is no tenderness. There is no rebound and no guarding.  Musculoskeletal: Normal range of motion. She exhibits no edema or tenderness.  There is some knee rigidity because of past knee surgeries  Lymphadenopathy:    She has no cervical adenopathy.  Neurological: She is alert and oriented to person, place, and time. She has normal reflexes. No cranial nerve deficit.  Skin: Skin is warm and dry. No rash noted. No erythema. No pallor.  Psychiatric: She has a normal mood and affect. Her behavior is normal. Judgment and thought content normal.  Nursing note and vitals reviewed.   BP 152/73 mmHg  Pulse 68  Temp(Src) 97.2 F (36.2 C) (Oral)  Ht '5\' 3"'  (1.6 m)  Wt 214 lb 9.6 oz (97.342 kg)  BMI 38.02 kg/m2  LMP 04/29/1991       Assessment & Plan:  1. Other specified hypothyroidism - Thyroid Panel With TSH  2. Essential hypertension, benign - BMP8+EGFR  3. Hyperlipemia - NMR, lipoprofile - Hepatic function panel - BMP8+EGFR  4. Other fatigue - POCT CBC - Thyroid Panel With TSH - Vit D  25 hydroxy (rtn osteoporosis monitoring)  5. Vitamin D deficiency - Vit D  25 hydroxy (rtn osteoporosis monitoring)  6. Dizziness -Refer to ear nose and throat  7. Nausea  8. Metabolic syndrome  Patient Instructions  Please do not forget to schedule your visit for an eye exam and a pelvic exam We will arrange for you to see an ear nose and throat specialist regarding your recurring episodes of dizziness and nausea Do not put yourself at risk for falling It is important that you  schedule herself for a mammogram. Do not forget to get your flu shot If you have not had a shingles shot this should also be done.   use a cool mist humidifier saline nose spray if needed for nasal congestion. Arrie Senate MD

## 2014-10-04 NOTE — Patient Instructions (Signed)
Please do not forget to schedule your visit for an eye exam and a pelvic exam We will arrange for you to see an ear nose and throat specialist regarding your recurring episodes of dizziness and nausea Do not put yourself at risk for falling It is important that you schedule herself for a mammogram. Do not forget to get your flu shot If you have not had a shingles shot this should also be done.

## 2014-10-04 NOTE — Addendum Note (Signed)
Addended by: Marin Olp on: 10/04/2014 11:38 AM   Modules accepted: Orders

## 2014-10-05 LAB — VITAMIN D 25 HYDROXY (VIT D DEFICIENCY, FRACTURES): Vit D, 25-Hydroxy: 41.4 ng/mL (ref 30.0–100.0)

## 2014-10-05 LAB — BMP8+EGFR
BUN/Creatinine Ratio: 13 (ref 11–26)
BUN: 14 mg/dL (ref 8–27)
CHLORIDE: 103 mmol/L (ref 97–108)
CO2: 27 mmol/L (ref 18–29)
Calcium: 9.3 mg/dL (ref 8.7–10.3)
Creatinine, Ser: 1.05 mg/dL — ABNORMAL HIGH (ref 0.57–1.00)
GFR calc Af Amer: 64 mL/min/{1.73_m2} (ref >59)
GFR calc non Af Amer: 56 mL/min/{1.73_m2} — ABNORMAL LOW (ref >59)
GLUCOSE: 89 mg/dL (ref 65–99)
Potassium: 4.4 mmol/L (ref 3.5–5.2)
Sodium: 141 mmol/L (ref 134–144)

## 2014-10-05 LAB — HEPATIC FUNCTION PANEL
ALK PHOS: 96 IU/L (ref 39–117)
ALT: 17 IU/L (ref 0–32)
AST: 27 IU/L (ref 0–40)
Albumin: 4.4 g/dL (ref 3.6–4.8)
Bilirubin, Direct: 0.09 mg/dL (ref 0.00–0.40)
Total Bilirubin: 0.3 mg/dL (ref 0.0–1.2)
Total Protein: 6.4 g/dL (ref 6.0–8.5)

## 2014-10-05 LAB — THYROID PANEL WITH TSH
Free Thyroxine Index: 2.4 (ref 1.2–4.9)
T3 UPTAKE RATIO: 24 % (ref 24–39)
T4, Total: 10.2 ug/dL (ref 4.5–12.0)
TSH: 0.801 u[IU]/mL (ref 0.450–4.500)

## 2014-10-05 LAB — NMR, LIPOPROFILE
Cholesterol: 243 mg/dL — ABNORMAL HIGH (ref 100–199)
HDL Cholesterol by NMR: 58 mg/dL (ref >39)
HDL Particle Number: 33.3 umol/L (ref >=30.5)
LDL PARTICLE NUMBER: 1624 nmol/L — AB (ref <1000)
LDL SIZE: 21.7 nm (ref >20.5)
LDL-C: 158 mg/dL — ABNORMAL HIGH (ref 0–99)
LP-IR SCORE: 37 (ref <=45)
Small LDL Particle Number: 381 nmol/L (ref <=527)
TRIGLYCERIDES BY NMR: 137 mg/dL (ref 0–149)

## 2014-10-07 DIAGNOSIS — N840 Polyp of corpus uteri: Secondary | ICD-10-CM | POA: Diagnosis not present

## 2014-10-07 DIAGNOSIS — N950 Postmenopausal bleeding: Secondary | ICD-10-CM | POA: Diagnosis not present

## 2014-11-04 ENCOUNTER — Other Ambulatory Visit: Payer: Self-pay | Admitting: Obstetrics and Gynecology

## 2014-11-08 ENCOUNTER — Telehealth: Payer: Self-pay | Admitting: *Deleted

## 2014-11-08 ENCOUNTER — Encounter (HOSPITAL_BASED_OUTPATIENT_CLINIC_OR_DEPARTMENT_OTHER): Payer: Self-pay | Admitting: *Deleted

## 2014-11-08 NOTE — Progress Notes (Signed)
   11/08/14 1339  OBSTRUCTIVE SLEEP APNEA  Have you ever been diagnosed with sleep apnea through a sleep study? No  Do you snore loudly (loud enough to be heard through closed doors)?  1  Do you often feel tired, fatigued, or sleepy during the daytime? 0  Has anyone observed you stop breathing during your sleep? 0  Do you have, or are you being treated for high blood pressure? 1  BMI more than 35 kg/m2? 1  Age over 66 years old? 1  Gender: 0  Obstructive Sleep Apnea Score 4  Score 4 or greater  Results sent to PCP

## 2014-11-08 NOTE — Progress Notes (Signed)
NPO AFTER MN. ARRIVE AT 1030. NEEDS CBC, BMET , AND EKG. WILL TAKE ATENOLOL AND SYNTHYROID AM DOS W/ SIPS OF WATER.

## 2014-11-08 NOTE — Telephone Encounter (Signed)
Left message for patient on answering machine that we received a note from pre-op that patient needs a sleep study. Per Dr. Laurance Flatten patient needs a referral to Dr. Baird Lyons.

## 2014-11-09 ENCOUNTER — Encounter (HOSPITAL_BASED_OUTPATIENT_CLINIC_OR_DEPARTMENT_OTHER): Payer: Self-pay | Admitting: *Deleted

## 2014-11-09 NOTE — Telephone Encounter (Signed)
Talked to patient and explained the results of her sleep apnea questionaire and she does not want to be referred at this time. She will talk to Dr. Laurance Flatten at her next visit.

## 2014-11-11 ENCOUNTER — Encounter (HOSPITAL_BASED_OUTPATIENT_CLINIC_OR_DEPARTMENT_OTHER)
Admission: RE | Disposition: A | Discharge status: Home / Self Care | Payer: Self-pay | Source: Ambulatory Visit | Attending: Obstetrics and Gynecology

## 2014-11-11 ENCOUNTER — Encounter (HOSPITAL_BASED_OUTPATIENT_CLINIC_OR_DEPARTMENT_OTHER): Payer: Self-pay | Admitting: *Deleted

## 2014-11-11 ENCOUNTER — Ambulatory Visit (HOSPITAL_BASED_OUTPATIENT_CLINIC_OR_DEPARTMENT_OTHER)
Admission: RE | Admit: 2014-11-11 | Discharge: 2014-11-11 | Disposition: A | Discharge status: Home / Self Care | Payer: BLUE CROSS/BLUE SHIELD | Source: Ambulatory Visit | Attending: Obstetrics and Gynecology | Admitting: Obstetrics and Gynecology

## 2014-11-11 ENCOUNTER — Ambulatory Visit (HOSPITAL_BASED_OUTPATIENT_CLINIC_OR_DEPARTMENT_OTHER): Payer: BLUE CROSS/BLUE SHIELD | Admitting: Anesthesiology

## 2014-11-11 ENCOUNTER — Other Ambulatory Visit: Payer: Self-pay

## 2014-11-11 DIAGNOSIS — N840 Polyp of corpus uteri: Secondary | ICD-10-CM | POA: Diagnosis not present

## 2014-11-11 DIAGNOSIS — N950 Postmenopausal bleeding: Secondary | ICD-10-CM | POA: Insufficient documentation

## 2014-11-11 DIAGNOSIS — I10 Essential (primary) hypertension: Secondary | ICD-10-CM | POA: Diagnosis not present

## 2014-11-11 DIAGNOSIS — K219 Gastro-esophageal reflux disease without esophagitis: Secondary | ICD-10-CM | POA: Diagnosis not present

## 2014-11-11 DIAGNOSIS — E039 Hypothyroidism, unspecified: Secondary | ICD-10-CM | POA: Diagnosis not present

## 2014-11-11 DIAGNOSIS — Z7982 Long term (current) use of aspirin: Secondary | ICD-10-CM | POA: Insufficient documentation

## 2014-11-11 DIAGNOSIS — M1990 Unspecified osteoarthritis, unspecified site: Secondary | ICD-10-CM | POA: Insufficient documentation

## 2014-11-11 HISTORY — DX: Personal history of colonic polyps: Z86.010

## 2014-11-11 HISTORY — PX: HYSTEROSCOPY WITH D & C: SHX1775

## 2014-11-11 HISTORY — DX: Personal history of colon polyps, unspecified: Z86.0100

## 2014-11-11 HISTORY — DX: Postprocedural hypothyroidism: E89.0

## 2014-11-11 HISTORY — DX: Other constipation: K59.09

## 2014-11-11 HISTORY — DX: Spondylosis without myelopathy or radiculopathy, cervical region: M47.812

## 2014-11-11 HISTORY — DX: Urge incontinence: N39.41

## 2014-11-11 HISTORY — DX: Gastro-esophageal reflux disease without esophagitis: K21.9

## 2014-11-11 HISTORY — DX: Personal history of other specified conditions: Z87.898

## 2014-11-11 HISTORY — DX: Postmenopausal bleeding: N95.0

## 2014-11-11 HISTORY — DX: Localized edema: R60.0

## 2014-11-11 HISTORY — DX: Other specified personal risk factors, not elsewhere classified: Z91.89

## 2014-11-11 HISTORY — DX: Disorder of vein, unspecified: I87.9

## 2014-11-11 HISTORY — DX: Personal history of irradiation: Z92.3

## 2014-11-11 HISTORY — DX: Unspecified osteoarthritis, unspecified site: M19.90

## 2014-11-11 LAB — CBC
HEMATOCRIT: 42.7 % (ref 36.0–46.0)
Hemoglobin: 14.1 g/dL (ref 12.0–15.0)
MCH: 29 pg (ref 26.0–34.0)
MCHC: 33 g/dL (ref 30.0–36.0)
MCV: 87.9 fL (ref 78.0–100.0)
Platelets: 196 10*3/uL (ref 150–400)
RBC: 4.86 MIL/uL (ref 3.87–5.11)
RDW: 13.8 % (ref 11.5–15.5)
WBC: 4.7 10*3/uL (ref 4.0–10.5)

## 2014-11-11 LAB — BASIC METABOLIC PANEL
Anion gap: 7 (ref 5–15)
BUN: 15 mg/dL (ref 6–23)
CO2: 27 mmol/L (ref 19–32)
Calcium: 9.2 mg/dL (ref 8.4–10.5)
Chloride: 107 mEq/L (ref 96–112)
Creatinine, Ser: 0.89 mg/dL (ref 0.50–1.10)
GFR calc Af Amer: 77 mL/min — ABNORMAL LOW (ref >90)
GFR calc non Af Amer: 67 mL/min — ABNORMAL LOW (ref >90)
Glucose, Bld: 86 mg/dL (ref 70–99)
Potassium: 3.9 mmol/L (ref 3.5–5.1)
Sodium: 141 mmol/L (ref 135–145)

## 2014-11-11 SURGERY — DILATATION AND CURETTAGE /HYSTEROSCOPY
Anesthesia: General | Site: Uterus

## 2014-11-11 MED ORDER — GLYCINE 1.5 % IR SOLN
Status: DC | PRN
  Administered 2014-11-11: 3000 mL

## 2014-11-11 MED ORDER — HYDROMORPHONE HCL 1 MG/ML IJ SOLN
0.2500 mg | INTRAMUSCULAR | Status: DC | PRN
  Filled 2014-11-11: qty 1

## 2014-11-11 MED ORDER — ONDANSETRON HCL 4 MG/2ML IJ SOLN
INTRAMUSCULAR | Status: DC | PRN
  Administered 2014-11-11: 4 mg via INTRAVENOUS

## 2014-11-11 MED ORDER — LIDOCAINE HCL (PF) 1 % IJ SOLN
INTRAMUSCULAR | Status: DC | PRN
  Administered 2014-11-11: 10 mL

## 2014-11-11 MED ORDER — FENTANYL CITRATE 0.05 MG/ML IJ SOLN
INTRAMUSCULAR | Status: DC | PRN
  Administered 2014-11-11 (×2): 50 ug via INTRAVENOUS

## 2014-11-11 MED ORDER — CEFAZOLIN SODIUM-DEXTROSE 2-3 GM-% IV SOLR
INTRAVENOUS | Status: AC
  Filled 2014-11-11: qty 50

## 2014-11-11 MED ORDER — FENTANYL CITRATE 0.05 MG/ML IJ SOLN
INTRAMUSCULAR | Status: AC
  Filled 2014-11-11: qty 4

## 2014-11-11 MED ORDER — LIDOCAINE HCL (CARDIAC) 20 MG/ML IV SOLN
INTRAVENOUS | Status: DC | PRN
  Administered 2014-11-11: 80 mg via INTRAVENOUS

## 2014-11-11 MED ORDER — CEFAZOLIN SODIUM-DEXTROSE 2-3 GM-% IV SOLR
INTRAVENOUS | Status: DC | PRN
  Administered 2014-11-11: 2 g via INTRAVENOUS

## 2014-11-11 MED ORDER — LACTATED RINGERS IV SOLN
INTRAVENOUS | Status: DC
  Administered 2014-11-11: 11:00:00 via INTRAVENOUS
  Filled 2014-11-11: qty 1000

## 2014-11-11 MED ORDER — MIDAZOLAM HCL 2 MG/2ML IJ SOLN
INTRAMUSCULAR | Status: AC
  Filled 2014-11-11: qty 2

## 2014-11-11 MED ORDER — DEXAMETHASONE SODIUM PHOSPHATE 4 MG/ML IJ SOLN
INTRAMUSCULAR | Status: DC | PRN
  Administered 2014-11-11: 10 mg via INTRAVENOUS

## 2014-11-11 MED ORDER — MIDAZOLAM HCL 5 MG/5ML IJ SOLN
INTRAMUSCULAR | Status: DC | PRN
  Administered 2014-11-11: 2 mg via INTRAVENOUS

## 2014-11-11 MED ORDER — PROPOFOL 10 MG/ML IV BOLUS
INTRAVENOUS | Status: DC | PRN
  Administered 2014-11-11: 200 mg via INTRAVENOUS

## 2014-11-11 SURGICAL SUPPLY — 36 items
CANISTER SUCTION 2500CC (MISCELLANEOUS) ×3 IMPLANT
CATH ROBINSON RED A/P 16FR (CATHETERS) ×3 IMPLANT
CLOTH BEACON ORANGE TIMEOUT ST (SAFETY) ×3 IMPLANT
CORD ACTIVE DISPOSABLE (ELECTRODE)
CORD ELECTRO ACTIVE DISP (ELECTRODE) ×1 IMPLANT
COVER TABLE BACK 60X90 (DRAPES) ×3 IMPLANT
DRAPE CAMERA CLOSED 9X96 (DRAPES) ×3 IMPLANT
DRAPE LG THREE QUARTER DISP (DRAPES) ×3 IMPLANT
ELECT LOOP GYNE PRO 24FR (CUTTING LOOP) ×3
ELECT REM PT RETURN 9FT ADLT (ELECTROSURGICAL) ×3
ELECT VAPORTRODE GRVD BAR (ELECTRODE) IMPLANT
ELECTRODE LOOP GYNE PRO 24FR (CUTTING LOOP) ×1 IMPLANT
ELECTRODE REM PT RTRN 9FT ADLT (ELECTROSURGICAL) ×1 IMPLANT
FLUID CONTROL SYSTEM ×2 IMPLANT
GLOVE BIO SURGEON STRL SZ 6.5 (GLOVE) ×2 IMPLANT
GLOVE BIO SURGEONS STRL SZ 6.5 (GLOVE) ×2
GLOVE INDICATOR 6.5 STRL GRN (GLOVE) ×4 IMPLANT
GLYCINE 1.5% IRRIG UROMATIC (IV SOLUTION) ×2 IMPLANT
GOWN STRL REUS W/ TWL LRG LVL3 (GOWN DISPOSABLE) IMPLANT
GOWN STRL REUS W/ TWL XL LVL3 (GOWN DISPOSABLE) IMPLANT
GOWN STRL REUS W/TWL LRG LVL3 (GOWN DISPOSABLE) ×3
GOWN STRL REUS W/TWL XL LVL3 (GOWN DISPOSABLE) ×3
LEGGING LITHOTOMY PAIR STRL (DRAPES) ×3 IMPLANT
NDL SPNL 22GX3.5 QUINCKE BK (NEEDLE) IMPLANT
NEEDLE SPNL 22GX3.5 QUINCKE BK (NEEDLE) ×3 IMPLANT
OUTFLOW TUBE ×2 IMPLANT
PACK BASIN DAY SURGERY FS (CUSTOM PROCEDURE TRAY) ×3 IMPLANT
PAD OB MATERNITY 4.3X12.25 (PERSONAL CARE ITEMS) ×3 IMPLANT
PAD PREP 24X48 CUFFED NSTRL (MISCELLANEOUS) ×3 IMPLANT
SYR CONTROL 10ML LL (SYRINGE) ×2 IMPLANT
TOWEL OR 17X24 6PK STRL BLUE (TOWEL DISPOSABLE) ×6 IMPLANT
TRAY DSU PREP LF (CUSTOM PROCEDURE TRAY) ×3 IMPLANT
TUBE HYSTEROSCOPY W Y-CONNECT (TUBING) ×3 IMPLANT
TUBING AQUILEX INFLOW (TUBING) ×1 IMPLANT
TUBING AQUILEX OUTFLOW (TUBING) ×2 IMPLANT
WATER STERILE IRR 500ML POUR (IV SOLUTION) ×1 IMPLANT

## 2014-11-11 NOTE — Anesthesia Preprocedure Evaluation (Addendum)
Anesthesia Evaluation  Patient identified by MRN, date of birth, ID band Patient awake    Reviewed: Allergy & Precautions, H&P , NPO status , Patient's Chart, lab work & pertinent test results  Airway Mallampati: II  TM Distance: >3 FB Neck ROM: Full    Dental  (+) Teeth Intact, Dental Advisory Given   Pulmonary neg pulmonary ROS,  breath sounds clear to auscultation        Cardiovascular hypertension, Pt. on medications and Pt. on home beta blockers Rhythm:Regular Rate:Normal     Neuro/Psych negative neurological ROS  negative psych ROS   GI/Hepatic Neg liver ROS, GERD-  Controlled,  Endo/Other  Hypothyroidism   Renal/GU negative Renal ROS  negative genitourinary   Musculoskeletal negative musculoskeletal ROS (+) Arthritis -, Osteoarthritis,    Abdominal   Peds negative pediatric ROS (+)  Hematology negative hematology ROS (+)   Anesthesia Other Findings   Reproductive/Obstetrics negative OB ROS                            Anesthesia Physical Anesthesia Plan  ASA: II  Anesthesia Plan: General   Post-op Pain Management:    Induction: Intravenous  Airway Management Planned: LMA  Additional Equipment:   Intra-op Plan:   Post-operative Plan: Extubation in OR  Informed Consent: I have reviewed the patients History and Physical, chart, labs and discussed the procedure including the risks, benefits and alternatives for the proposed anesthesia with the patient or authorized representative who has indicated his/her understanding and acceptance.   Dental advisory given  Plan Discussed with: CRNA  Anesthesia Plan Comments:         Anesthesia Quick Evaluation

## 2014-11-11 NOTE — Transfer of Care (Signed)
Immediate Anesthesia Transfer of Care Note  Patient: Miranda Wilson  Procedure(s) Performed: Procedure(s): DILATATION AND CURETTAGE /HYSTEROSCOPY (N/A)  Patient Location: PACU  Anesthesia Type:General  Level of Consciousness: awake and oriented  Airway & Oxygen Therapy: Patient Spontanous Breathing and Patient connected to face mask oxygen  Post-op Assessment: Report given to PACU RN  Post vital signs: Reviewed and stable  Complications: No apparent anesthesia complications

## 2014-11-11 NOTE — Progress Notes (Signed)
Patient ID: Miranda Wilson, female   DOB: 09/17/1951, 64 y.o.   MRN: [id number] Brief op note:  Pre op Dx: Post menopausal bleeding Post op Dx; same with atrophic endometrium Procedure: D&C and hysteroscopy Surgeon; Gus Height MD Anesthesia: General  Complications none  Finding: No endocervical lesions noted Atrophic appearing endometrium  Specimen: Endometrial currettings  EBL: 10 ccs  Tolerated procedure well. All counts were correct.

## 2014-11-11 NOTE — Progress Notes (Signed)
Patient ID: Miranda Wilson, female   DOB: 1951-02-08, 64 y.o.   MRN: [id number] Here for [facility] hysteroscopy for continued post menopausal bleeding with benign pathology on biopsy But still with persistent flow. Status is unchanged.  Abdomen: soft nion tender Heart regular rhythm no murmur or gallop Pelvic: external genitalia with normal limits              BUS: negative              Vagina without lesion              Cervix without lesion              Uterus anterior non tender normal size              Adnexa no masses noted  Impression: post menopausal bleeding  Plan D&C and hysteroscopy.

## 2014-11-11 NOTE — Brief Op Note (Signed)
11/11/2014  8:18 PM  PATIENT:  Miranda Wilson  64 y.o. female  PRE-OPERATIVE DIAGNOSIS:  POST MENOPAUSAL BLEEDING  POST-OPERATIVE DIAGNOSIS:  POST MENOPAUSAL BLEEDING  PROCEDURE:  Procedure(s): DILATATION AND CURETTAGE /HYSTEROSCOPY (N/A)  SURGEON:  Surgeon(s) and Role:    * Gus Height, MD - Primary  PHYSICIAN ASSISTANT:    ANESTHESIA:   general  EBL:     BLOOD ADMINISTERED:none  DRAINS: none   LOCAL MEDICATIONS USED:  LIDOCAINE   SPECIMEN:  Source of Specimen:  Endometrial currettings  DISPOSITION OF SPECIMEN:  PATHOLOGY  COUNTS:  YES  TOURNIQUET:  * No tourniquets in log *  DICTATION: .Other Dictation: Dictation Number Dictation  [id number]  PLAN OF CARE: Discharge to home after PACU  PATIENT DISPOSITION:  PACU - hemodynamically stable.   Delay start of Pharmacological VTE agent (>24hrs) due to surgical blood loss or risk of bleeding: PAS hose applied

## 2014-11-11 NOTE — Anesthesia Postprocedure Evaluation (Signed)
  Anesthesia Post-op Note  Patient: Miranda Wilson  Procedure(s) Performed: Procedure(s): DILATATION AND CURETTAGE /HYSTEROSCOPY (N/A)  Patient Location: PACU  Anesthesia Type:General  Level of Consciousness: awake and alert   Airway and Oxygen Therapy: Patient Spontanous Breathing  Post-op Pain: none  Post-op Assessment: Post-op Vital signs reviewed, Patient's Cardiovascular Status Stable and Respiratory Function Stable  Post-op Vital Signs: Reviewed  Filed Vitals:   11/11/14 1415  BP:   Pulse: 50  Temp:   Resp: 16    Complications: No apparent anesthesia complications

## 2014-11-11 NOTE — Discharge Instructions (Addendum)
Nothing per vagina for two weeks Call for pathology report next week Call for any problems such as fever severe pain or heavy bleeding  Set up post op visit for 4 weeks.    Dilation and Curettage or Vacuum Curettage, Care After These instructions give you information on caring for yourself after your procedure. Your doctor may also give you more specific instructions. Call your doctor if you have any problems or questions after your procedure. HOME CARE  Do not drive for 24 hours.  Wait 1 week before doing any activities that wear you out.  Take your temperature 2 times a day for 4 days. Write it down. Tell your doctor if you have a fever.  Do not stand for a long time.  Do not lift, push, or pull anything over 10 pounds (4.5 kilograms).  Limit stair climbing to once or twice a day.  Rest often.  Continue with your usual diet.  Drink enough fluids to keep your pee (urine) clear or pale yellow.  If you have a hard time pooping (constipation), you may:  Take a medicine to help you go poop (laxative) as told by your doctor.  Eat more fruit and bran.  Drink more fluids.  Take showers, not baths, for as long as told by your doctor.  Do not swim or use a hot tub until your doctor says it is okay.  Have someone with you for 1-2 days after the procedure.  Do not douche, use tampons, or have sex (intercourse) for 2 weeks.  Only take medicines as told by your doctor. Do not take aspirin. It can cause bleeding.  Keep all doctor visits. GET HELP IF:  You have cramps or pain not helped by medicine.  You have new pain in the belly (abdomen).  You have a bad smelling fluid coming from your vagina.  You have a rash.  You have problems with any medicine. GET HELP RIGHT AWAY IF:   You start to bleed more than a regular period.  You have a fever.  You have chest pain.  You have trouble breathing.  You feel dizzy or feel like passing out (fainting).  You pass  out.  You have pain in the tops of your shoulders.  You have vaginal bleeding with or without clumps of blood (blood clots). MAKE SURE YOU:  Understand these instructions.  Will watch your condition.  Will get help right away if you are not doing well or get worse. Document Released: 07/24/2008 Document Revised: 10/20/2013 Document Reviewed: 05/14/2013 [facility] Patient Information 2015 [place], [place]. This information is not intended to replace advice given to you by your health care provider. Make sure you discuss any questions you have with your health care provider.    Post Anesthesia Home Care Instructions  Activity: Get plenty of rest for the remainder of the day. A responsible adult should stay with you for 24 hours following the procedure.  For the next 24 hours, DO NOT: -Drive a car -Paediatric nurse -Drink alcoholic beverages -Take any medication unless instructed by your physician -Make any legal decisions or sign important papers.  Meals: Start with liquid foods such as gelatin or soup. Progress to regular foods as tolerated. Avoid greasy, spicy, heavy foods. If nausea and/or vomiting occur, drink only clear liquids until the nausea and/or vomiting subsides. Call your physician if vomiting continues.  Special Instructions/Symptoms: Your throat may feel dry or sore from the anesthesia or the breathing tube placed in your throat during  surgery. If this causes discomfort, gargle with warm salt water. The discomfort should disappear within 24 hours.

## 2014-11-11 NOTE — Anesthesia Procedure Notes (Signed)
Procedure Name: LMA Insertion Date/Time: 11/11/2014 1:18 PM Performed by: Bethena Roys T Pre-anesthesia Checklist: Patient identified, Emergency Drugs available, Suction available and Patient being monitored Patient Re-evaluated:Patient Re-evaluated prior to inductionOxygen Delivery Method: Circle System Utilized Preoxygenation: Pre-oxygenation with 100% oxygen Intubation Type: IV induction Ventilation: Mask ventilation without difficulty LMA: LMA inserted LMA Size: 4.0 Number of attempts: 1 Airway Equipment and Method: Oral airway Placement Confirmation: positive ETCO2 Tube secured with: Tape Dental Injury: Teeth and Oropharynx as per pre-operative assessment

## 2014-11-12 ENCOUNTER — Encounter (HOSPITAL_BASED_OUTPATIENT_CLINIC_OR_DEPARTMENT_OTHER): Payer: Self-pay | Admitting: Obstetrics and Gynecology

## 2014-11-12 NOTE — Op Note (Signed)
NAMEMONQUIE, FULGHAM NO.:  [id number]  MEDICAL RECORD NO.:  [id number]  LOCATION:                                 FACILITY:  PHYSICIAN:  Gus Height, M.D.       DATE OF BIRTH:  06-29-1951  DATE OF CONSULTATION:  11/11/2014 DATE OF DISCHARGE:  11/11/2014                                CONSULTATION   PREOPERATIVE DIAGNOSIS:  Postmenopausal bleeding.  POSTOPERATIVE DIAGNOSIS:  Postmenopausal bleeding.  PROCEDURE:  Cervical dilatation, hysteroscopy, uterine curettage.  SURGEON:  Gus Height, M.D.  ANESTHESIA:  General.  COMPLICATIONS:  None.  JUSTIFICATION:  The patient is a 64 year old white female who presented with heavy vaginal bleeding.  This was assessed in the office with an endometrial biopsy revealing atrophic endometrium; however, the bleeding persisted, it increased and because of this, she is now being taken to the operating room to ensure that no structural lesions or neoplasia exist within the uterus that was missed on the Pipelle biopsy of the uterus.  The risks and benefits of the procedure were discussed with the patient and informed consent has been obtained.  DESCRIPTION OF PROCEDURE:  The patient was taken to the operating room, placed in supine position.  General anesthesia was administered without difficulty.  She was then placed in dorsal lithotomy position, prepped and draped in usual sterile fashion.  Once this was done, bladder was catheterized.  Examination under anesthesia revealed normal external genitalia.  Normal Bartholin's, Skene's glands.  Normal urethra.  The vaginal vault was without gross lesion except for signs of atrophy.  The cervix was without gross lesion.  The uterus was noted to be anterior, normal size and shape and mobile.  The adnexa revealed no masses.  At this point, speculum was placed in the vaginal vault.  Anterior cervical lip was grasped with a tenaculum and then the endocervical canal was dilated using  serial Pratt dilators until a #27 Pratt dilator could be passed.  At this point, the diagnostic hysteroscope was advanced through the endocervical canal.  No endocervical lesions were noted.  The endometrial cavity was then inspected and again, no lesions were noted. There were no polyps or submucous myomas.  There appeared to be petechial-type hemorrhages noted on the surface, which appeared to be atrophic.  At this point, the hysteroscope was removed.  The uterine cavity was curetted vigorously with medium-sized serrated curette and small amount of tissue was sent for histologic study.  Once this was completed, the cervix was injected with 10 mL of 1% Xylocaine for postop analgesia.  All instruments were removed.  Hemostasis readily achieved. The patient was reversed from the anesthetic and taken to recovery room in satisfactory condition.  Estimated blood loss from the procedure was approximately 10 mL.  The patient will be discharged home.  She is to call next week for pathology report.  She is to call for any problems such as fever, pain, or heavy bleeding.  She will be seen back in the office in 3-4 weeks for postop examination.     Gus Height, M.D.     AR/MEDQ  D:  11/12/2014  T:  11/12/2014  Job:  [id number]

## 2014-11-12 NOTE — Consult Note (Deleted)
NAMEMARYBETH, DANDY NO.:  [id number]  MEDICAL RECORD NO.:  [id number]  LOCATION:                                 FACILITY:  PHYSICIAN:  Gus Height, M.D.       DATE OF BIRTH:  11-01-1950  DATE OF CONSULTATION:  11/11/2014 DATE OF DISCHARGE:  11/11/2014                                CONSULTATION   PREOPERATIVE DIAGNOSIS:  Postmenopausal bleeding.  POSTOPERATIVE DIAGNOSIS:  Postmenopausal bleeding.  PROCEDURE:  Cervical dilatation, hysteroscopy, uterine curettage.  SURGEON:  Gus Height, M.D.  ANESTHESIA:  General.  COMPLICATIONS:  None.  JUSTIFICATION:  The patient is a 64 year old white female who presented with heavy vaginal bleeding.  This was assessed in the office with an endometrial biopsy revealing atrophic endometrium; however, the bleeding persisted, it increased and because of this, she is now being taken to the operating room to ensure that no structural lesions or neoplasia exist within the uterus that was missed on the Pipelle biopsy of the uterus.  The risks and benefits of the procedure were discussed with the patient and informed consent has been obtained.  DESCRIPTION OF PROCEDURE:  The patient was taken to the operating room, placed in supine position.  General anesthesia was administered without difficulty.  She was then placed in dorsal lithotomy position, prepped and draped in usual sterile fashion.  Once this was done, bladder was catheterized.  Examination under anesthesia revealed normal external genitalia.  Normal Bartholin's, Skene's glands.  Normal urethra.  The vaginal vault was without gross lesion except for signs of atrophy.  The cervix was without gross lesion.  The uterus was noted to be anterior, normal size and shape and mobile.  The adnexa revealed no masses.  At this point, speculum was placed in the vaginal vault.  Anterior cervical lip was grasped with a tenaculum and then the endocervical canal was dilated using  serial Pratt dilators until a #27 Pratt dilator could be passed.  At this point, the diagnostic hysteroscope was advanced through the endocervical canal.  No endocervical lesions were noted.  The endometrial cavity was then inspected and again, no lesions were noted. There were no polyps or submucous myomas.  There appeared to be petechial-type hemorrhages noted on the surface, which appeared to be atrophic.  At this point, the hysteroscope was removed.  The uterine cavity was curetted vigorously with medium-sized serrated curette and small amount of tissue was sent for histologic study.  Once this was completed, the cervix was injected with 10 mL of 1% Xylocaine for postop analgesia.  All instruments were removed.  Hemostasis readily achieved. The patient was reversed from the anesthetic and taken to recovery room in satisfactory condition.  Estimated blood loss from the procedure was approximately 10 mL.  The patient will be discharged home.  She is to call next week for pathology report.  She is to call for any problems such as fever, pain, or heavy bleeding.  She will be seen back in the office in 3-4 weeks for postop examination.     Gus Height, M.D.     AR/MEDQ  D:  11/12/2014  T:  11/12/2014  Job:  [id number]

## 2014-11-15 NOTE — H&P (Signed)
Miranda Wilson is an 64 y.o. female who has had several episodes of post menopausal bleeding which have been assessed with endovaginal ultrasound and endometrial biopsies. These have yielded benign atrophic endometrium but the bleeding has continued and the last episode was heavy. Dueto this continued post menopausal bleeding she is being taken to the OR for a D&c and hysteroscopy to be sure that no endometrial neoplasia exist and that there are no anatomic abnormalities accounting for the bleeding.  Pertinent Gynecological History:  Bleeding: post menopausal bleeding Contraception: post menopausal status DES exposure: unknown    Patient's last menstrual period was 04/29/1991.    Past Medical History  Diagnosis Date  . Hyperlipidemia   . Hypertension   . IBS (irritable bowel syndrome)   . Varicose veins   . PMB (postmenopausal bleeding)   . History of idiopathic seizure     AGE 37 to 20  X5  --  UNKNOW IDIOLOGY , PER PT WAS ANEMIC AT THE TIME--  NONE SINCE  . S/P radioactive iodine thyroid ablation   . History of tachycardia     S/P  RADIOACTIVE IODINE ABLATION OF THYROID  . History of colon polyps   . Cervical spondylosis   . OA (osteoarthritis)     shoulders  left > right  . GERD (gastroesophageal reflux disease)   . Chronic constipation   . Urge urinary incontinence   . Bilateral edema of lower extremity     left > right --  wear compression hose  . Hypothyroidism, postradioiodine therapy     AGE 18  . At risk for sleep apnea   . Vein disorder     LEFT ANKLE VEIN VALVE REFLUX WITH DECREASED CIRCULATION     Past Surgical History  Procedure Laterality Date  . Varicose vein surgery  1998 to 2010    includes laser and phlebectomies  . Cesarean section  1979  . Endovenous ablation saphenous vein w/ laser  04/2007  . Tonsillectomy  1955  . Colonoscopy w/ polypectomy  10-25-2008  . Total knee arthroplasty Left 12-19-2009  . Closed left knee manipulation and right knee  cortisone injection  02-20-2010  . Left knee arthrotomy w/ lysis adhesions  12-01-2010  . Laparoscopic cholecystectomy  1993    and BILATERAL TUBAL LIGATION  . Cataract extraction w/ intraocular lens  implant, bilateral  right 2011//  left 2013  . Hysteroscopy w/d&c N/A 11/11/2014    Procedure: DILATATION AND CURETTAGE /HYSTEROSCOPY;  Surgeon: Gus Height, MD;  Location: [facility];  Service: Gynecology;  Laterality: N/A;    Family History  Problem Relation Age of Onset  . Myasthenia gravis Father   . Asthma Father   . COPD Mother     smoked  . Glaucoma Mother   . Parkinsonism Mother   . Obesity Brother   . Obesity Brother   . Obesity Brother   . Obesity Brother   . Hyperlipidemia Brother   . Hyperlipidemia Brother   . Hyperlipidemia Brother   . Hyperlipidemia Brother   . Arthritis Brother   . Emphysema Father     smoked    Social History:  reports that she has never smoked. She has never used smokeless tobacco. She reports that she does not drink alcohol or use illicit drugs.  Allergies:  Allergies  Allergen Reactions  . Clindamycin/Lincomycin Other (See Comments)    Dizzy spells  . Codeine Nausea And Vomiting  . Crestor [Rosuvastatin Calcium] Other (See Comments)  Aching flu like large dose   . Lipitor [Atorvastatin Calcium] Other (See Comments)    Numbness in feet   . Pravachol Other (See Comments)    Back pain    . Simvastatin Other (See Comments)    Back pain    . Ultram [Tramadol Hcl] Nausea And Vomiting  . Vioxx [Rofecoxib] Other (See Comments)    Fluid rentention , elevated blood pressure   . Feldene [Piroxicam] Rash  . Myrbetriq [Mirabegron] Other (See Comments)    Constipation, headache  . Penicillins Rash  . Toviaz [Fesoterodine Fumarate Er] Other (See Comments)    Dry skin, dry eyes, constipation    No prescriptions prior to admission    Review of Systems  Constitutional: Negative.   Respiratory: Negative.  Negative for cough  and hemoptysis.   Cardiovascular: Negative.  Negative for chest pain, palpitations and orthopnea.  Gastrointestinal: Negative.  Negative for heartburn, nausea, vomiting, abdominal pain and diarrhea.  Genitourinary: Negative.  Negative for dysuria, urgency and frequency.  Gyn: Positive for post menopausal bleeding, no discharge or itch  Blood pressure 160/67, pulse 51, temperature 97.6 F (36.4 C), temperature source Axillary, resp. rate 18, height 5\' 3"  (1.6 m), weight 97.07 kg (214 lb), last menstrual period 04/29/1991, SpO2 100 %. Physical Exam  Constitutional: She appears well-developed and well-nourished.  HENT:  Head: Normocephalic and atraumatic.  Eyes: Conjunctivae and EOM are normal. Pupils are equal, round, and reactive to light.  Neck: Normal range of motion. Neck supple.  Cardiovascular: Normal rate, regular rhythm and normal heart sounds.   Respiratory: Effort normal and breath sounds normal.  GI: Soft. Bowel sounds are normal. She exhibits no distension and no mass. There is no tenderness. There is no rebound and no guarding.  GYN: External genitalia: WNL           BUS: WNL            Vagina:: mild atrophic changes with loss of rugae             Cervix: without lesion            Uterus: anterior, non tender, non enlarged             Adnexa: No masses noted  No results found for this or any previous visit (from the past 24 hour(s)).  No results found.  Assessment/Plan:  Post menopausal bleeding  Plan: D&C and hysteroscopy:   Miranda Wilson 11/15/2014, 4:07 PM

## 2014-11-26 ENCOUNTER — Telehealth: Payer: Self-pay | Admitting: Family Medicine

## 2014-11-26 NOTE — Telephone Encounter (Signed)
Pt given appt with Dr. Laurance Flatten 2/9 at 11:15, she was offered earlier appt but declined.

## 2014-12-07 ENCOUNTER — Ambulatory Visit (INDEPENDENT_AMBULATORY_CARE_PROVIDER_SITE_OTHER): Payer: BLUE CROSS/BLUE SHIELD | Admitting: Family Medicine

## 2014-12-07 ENCOUNTER — Encounter: Payer: Self-pay | Admitting: Family Medicine

## 2014-12-07 VITALS — BP 132/70 | HR 50 | Temp 96.8°F | Ht 63.0 in | Wt 217.0 lb

## 2014-12-07 DIAGNOSIS — R5383 Other fatigue: Secondary | ICD-10-CM

## 2014-12-07 DIAGNOSIS — R209 Unspecified disturbances of skin sensation: Secondary | ICD-10-CM | POA: Diagnosis not present

## 2014-12-07 DIAGNOSIS — M6281 Muscle weakness (generalized): Secondary | ICD-10-CM | POA: Diagnosis not present

## 2014-12-07 DIAGNOSIS — R200 Anesthesia of skin: Secondary | ICD-10-CM

## 2014-12-07 DIAGNOSIS — L659 Nonscarring hair loss, unspecified: Secondary | ICD-10-CM

## 2014-12-07 DIAGNOSIS — M6289 Other specified disorders of muscle: Secondary | ICD-10-CM | POA: Diagnosis not present

## 2014-12-07 DIAGNOSIS — R29898 Other symptoms and signs involving the musculoskeletal system: Secondary | ICD-10-CM

## 2014-12-07 DIAGNOSIS — R6889 Other general symptoms and signs: Secondary | ICD-10-CM

## 2014-12-07 NOTE — Patient Instructions (Signed)
We will arrange for you to have peripheral nerve conduction velocity testing We will also today repeat your thyroid profile and get a B12 level because of the peripheral neuropathy, hair loss, dry skin, fatigue and cold sensitivity. Please purchase wrist braces to wear at nighttime while sleeping

## 2014-12-07 NOTE — Progress Notes (Signed)
Subjective:    Patient ID: Miranda Wilson, female    DOB: Jul 19, 1951, 64 y.o.   MRN: [id number]  HPI Patient here today for multiple reasons that include fatigue, hair loss, and dry skin. Since the last visit, the patient has had a D&C because of vaginal bleeding and was found that she had endometrial polyps. Since the [facility] she has had no more vaginal bleeding. She also has a history of cervical disc disease and has seen the neurosurgeon because of spondylosis in her neck and this was back in the summer. This report and the referral were reviewed with her during the visit today. Her biggest complaints however are clumsiness with her hands and numbness in her fingertips. This is been going on for couple months. She drops things easily and has this numbness that is especially noticed in the right hand and especially noticed at nighttime when her wrist is flexed up underneath her body. She also complains of hair loss increased dryness of skin and fatigue over the past couple of months. In addition of this she's had more cold sensitivity. In December she did have a thyroid profile which was normal. She had other lab work at the time too, and her kidney function was normal including her electrolytes.         Patient Active Problem List   Diagnosis Date Noted  . Metabolic syndrome 13/05/6577  . Bradycardia 05/31/2014  . Hyperlipemia 04/28/2013  . Vitamin D deficiency 04/28/2013  . Lumbar spondylosis with myelopathy 04/28/2013  . Osteoarthritis 04/28/2013  . Hypertension 04/28/2013  . Incontinence of urine 04/28/2013  . Hypothyroidism 04/28/2013  . Cough 08/21/2011  . Abnormal CXR 08/21/2011   Outpatient Encounter Prescriptions as of 12/07/2014  Medication Sig  . aspirin 81 MG tablet Take 81 mg by mouth daily.    Marland Kitchen atenolol (TENORMIN) 50 MG tablet TAKE 1 TABLET (50 MG TOTAL) BY MOUTH DAILY. (Patient taking differently: TAKE 1 TABLET (50 MG TOTAL) BY MOUTH DAILY.--- takes in am)  . Calcium  Carbonate-Vitamin D (CALTRATE 600+D) 600-400 MG-UNIT per tablet Take 1 tablet by mouth daily.  Sarajane Marek Sodium 30-100 MG CAPS Take 1 tablet by mouth daily as needed.   . cephALEXin (KEFLEX) 500 MG capsule   . Cholecalciferol (VITAMIN D3) 1000 UNITS CAPS Take 1 capsule by mouth daily.  . Glucosamine-Chondroitin 750-600 MG TABS Take 2 tablets by mouth daily.  Marland Kitchen levothyroxine (SYNTHROID, LEVOTHROID) 175 MCG tablet Take 1 tablet (175 mcg total) by mouth daily before breakfast.  . metaxalone (SKELAXIN) 800 MG tablet Take 1 tablet (800 mg total) by mouth 3 (three) times daily.  . Multiple Vitamins-Minerals (CENTRUM SILVER ADULT 50+ PO) Take 1 tablet by mouth daily.  . nabumetone (RELAFEN) 500 MG tablet Take 1 tablet (500 mg total) by mouth 2 (two) times daily as needed.  . naproxen sodium (ANAPROX) 220 MG tablet Take 220 mg by mouth as needed.  . Polyethylene Glycol 3350 (MIRALAX PO) Take by mouth as needed.    . rosuvastatin (CRESTOR) 40 MG tablet Take 20 mg by mouth every evening.  Marland Kitchen ZETIA 10 MG tablet TAKE 1 TABLET EVERY DAY  . [DISCONTINUED] Ibuprofen (ADVIL) 200 MG CAPS Take by mouth as needed.  . [DISCONTINUED] Multiple Vitamin (MULTIVITAMIN WITH MINERALS) TABS tablet Take 1 tablet by mouth daily.  . [DISCONTINUED] Omega-3 Fatty Acids (OMEGA-3 FISH OIL) 300 MG CAPS Take 1 capsule by mouth daily.    Review of Systems  Constitutional: Positive for fatigue.  HENT:  Negative.   Eyes: Negative.   Respiratory: Negative.   Cardiovascular: Negative.   Gastrointestinal: Negative.   Endocrine: Positive for cold intolerance.       Hair loss - brittle, sparse  Genitourinary: Negative.   Musculoskeletal: Negative.   Skin: Negative.        Dry skin   Allergic/Immunologic: Negative.   Neurological: Negative.        Numbness of finger tips "Clumsy" hands   Hematological: Negative.   Psychiatric/Behavioral: Negative.        Objective:   Physical Exam  Constitutional: She is  oriented to person, place, and time. She appears well-developed and well-nourished. No distress.  HENT:  Head: Normocephalic and atraumatic.  Right Ear: External ear normal.  Left Ear: External ear normal.  Nose: Nose normal.  Mouth/Throat: Oropharynx is clear and moist.  With pulling on her hair there was no gross hair removal  Eyes: Conjunctivae and EOM are normal. Pupils are equal, round, and reactive to light. Right eye exhibits no discharge. Left eye exhibits no discharge. No scleral icterus.  Neck: Normal range of motion. Neck supple. No thyromegaly present.  Cardiovascular: Normal rate, regular rhythm, normal heart sounds and intact distal pulses.   No murmur heard. The pulses in feet and wrists were both good and strong and equal bilaterally.  Pulmonary/Chest: Effort normal and breath sounds normal. No respiratory distress. She has no wheezes. She has no rales. She exhibits no tenderness.  Abdominal: She exhibits no mass.  Musculoskeletal: Normal range of motion. She exhibits no edema or tenderness.  There was minimal DIP swelling in the fingers of both hands. The neck is somewhat tender with palpation. She had fairly good range of motion with her neck.  Lymphadenopathy:    She has no cervical adenopathy.  Neurological: She is alert and oriented to person, place, and time. She has normal reflexes. No cranial nerve deficit.  Both upper and lower reflexes appear to be equal and normal bilaterally. The Tinel's sign in the wrist was negative for neuropathy in the fingers.  Skin: Skin is warm and dry. No rash noted.  Psychiatric: She has a normal mood and affect. Her behavior is normal. Judgment and thought content normal.  Nursing note and vitals reviewed.         Assessment & Plan:  1. Numbness of fingers -Schedule for nerve conduction velocity testing -Wear wrist braces on both hands at nighttime - Ambulatory referral to Neurology - Vitamin B12 - Thyroid Panel With TSH  2.  Weakness of hand -Schedule nerve conduction testing and wear wrist braces - Ambulatory referral to Neurology - Vitamin B12 - Thyroid Panel With TSH  3. Cold intolerance -Check thyroid - Vitamin B12 - Thyroid Panel With TSH  4. Hair loss - Vitamin B12 - Thyroid Panel With TSH  5. Other fatigue - Ambulatory referral to Neurology - Vitamin B12 - Thyroid Panel With TSH  Patient Instructions  We will arrange for you to have peripheral nerve conduction velocity testing We will also today repeat your thyroid profile and get a B12 level because of the peripheral neuropathy, hair loss, dry skin, fatigue and cold sensitivity. Please purchase wrist braces to wear at nighttime while sleeping   Arrie Senate MD

## 2014-12-08 ENCOUNTER — Encounter: Payer: Self-pay | Admitting: Family Medicine

## 2014-12-08 DIAGNOSIS — E538 Deficiency of other specified B group vitamins: Secondary | ICD-10-CM | POA: Insufficient documentation

## 2014-12-08 LAB — THYROID PANEL WITH TSH
FREE THYROXINE INDEX: 4.1 (ref 1.2–4.9)
T3 Uptake Ratio: 28 % (ref 24–39)
T4, Total: 14.6 ug/dL — ABNORMAL HIGH (ref 4.5–12.0)
TSH: 0.1 u[IU]/mL — AB (ref 0.450–4.500)

## 2014-12-08 LAB — VITAMIN B12: VITAMIN B 12: 173 pg/mL — AB (ref 211–946)

## 2014-12-09 MED ORDER — LEVOTHYROXINE SODIUM 150 MCG PO TABS: 150.0000 ug | ORAL_TABLET | Freq: Every day | ORAL | Status: DC

## 2014-12-09 NOTE — Addendum Note (Signed)
Addended by: Marylin Crosby on: 12/09/2014 11:16 AM   Modules accepted: Orders

## 2014-12-10 DIAGNOSIS — N849 Polyp of female genital tract, unspecified: Secondary | ICD-10-CM | POA: Diagnosis not present

## 2014-12-14 ENCOUNTER — Ambulatory Visit: Payer: Medicare Other

## 2014-12-15 ENCOUNTER — Ambulatory Visit (INDEPENDENT_AMBULATORY_CARE_PROVIDER_SITE_OTHER): Payer: BLUE CROSS/BLUE SHIELD | Admitting: *Deleted

## 2014-12-15 DIAGNOSIS — E538 Deficiency of other specified B group vitamins: Secondary | ICD-10-CM | POA: Diagnosis not present

## 2014-12-15 MED ORDER — CYANOCOBALAMIN 1000 MCG/ML IJ SOLN
1000.0000 ug | INTRAMUSCULAR | Status: AC
  Administered 2014-12-15 – 2015-01-04 (×4): 1000 ug via INTRAMUSCULAR

## 2014-12-15 NOTE — Patient Instructions (Signed)

## 2014-12-15 NOTE — Progress Notes (Signed)
Pt given B12 injection IM left deltoid, pt tolerated injection well. 

## 2014-12-20 ENCOUNTER — Telehealth: Payer: Self-pay | Admitting: Family Medicine

## 2014-12-20 NOTE — Telephone Encounter (Signed)
Detailed message left that patient can get a b12 injection tomorrow.

## 2014-12-21 ENCOUNTER — Ambulatory Visit (INDEPENDENT_AMBULATORY_CARE_PROVIDER_SITE_OTHER): Payer: BLUE CROSS/BLUE SHIELD | Admitting: *Deleted

## 2014-12-21 DIAGNOSIS — E538 Deficiency of other specified B group vitamins: Secondary | ICD-10-CM

## 2014-12-21 NOTE — Patient Instructions (Signed)

## 2014-12-21 NOTE — Progress Notes (Signed)
Pt given B12 injection IM right deltoid, pt tolerated injection well. 

## 2014-12-28 ENCOUNTER — Ambulatory Visit (INDEPENDENT_AMBULATORY_CARE_PROVIDER_SITE_OTHER): Payer: BLUE CROSS/BLUE SHIELD | Admitting: *Deleted

## 2014-12-28 DIAGNOSIS — E538 Deficiency of other specified B group vitamins: Secondary | ICD-10-CM | POA: Diagnosis not present

## 2014-12-28 NOTE — Progress Notes (Signed)
Vitamin b12 injection given and tolerated well.  

## 2014-12-28 NOTE — Patient Instructions (Signed)

## 2015-01-04 ENCOUNTER — Ambulatory Visit (INDEPENDENT_AMBULATORY_CARE_PROVIDER_SITE_OTHER): Payer: BLUE CROSS/BLUE SHIELD | Admitting: *Deleted

## 2015-01-04 DIAGNOSIS — E538 Deficiency of other specified B group vitamins: Secondary | ICD-10-CM

## 2015-01-04 NOTE — Progress Notes (Signed)
Pt given B12 injection IM right deltoid, pt tolerated injection well. 

## 2015-01-04 NOTE — Patient Instructions (Signed)

## 2015-01-11 ENCOUNTER — Encounter: Payer: BLUE CROSS/BLUE SHIELD | Admitting: Neurology

## 2015-01-18 ENCOUNTER — Other Ambulatory Visit (INDEPENDENT_AMBULATORY_CARE_PROVIDER_SITE_OTHER): Payer: BLUE CROSS/BLUE SHIELD

## 2015-01-18 DIAGNOSIS — E538 Deficiency of other specified B group vitamins: Secondary | ICD-10-CM

## 2015-01-18 NOTE — Progress Notes (Signed)
Lab only 

## 2015-01-19 LAB — VITAMIN B12: Vitamin B-12: 721 pg/mL (ref 211–946)

## 2015-01-20 ENCOUNTER — Telehealth: Payer: Self-pay | Admitting: *Deleted

## 2015-01-20 NOTE — Telephone Encounter (Signed)
Aware of B-12 results.

## 2015-01-20 NOTE — Telephone Encounter (Signed)
-----   Message from Chipper Herb, MD sent at 01/19/2015  8:09 PM EDT ----- The B12 level was within normal limits.----If the patient is taking B12 injection she should continue these.

## 2015-02-03 ENCOUNTER — Ambulatory Visit (INDEPENDENT_AMBULATORY_CARE_PROVIDER_SITE_OTHER): Payer: BLUE CROSS/BLUE SHIELD | Admitting: Family Medicine

## 2015-02-03 ENCOUNTER — Encounter: Payer: Self-pay | Admitting: Family Medicine

## 2015-02-03 VITALS — BP 142/78 | HR 48 | Temp 97.1°F | Ht 63.0 in | Wt 215.0 lb

## 2015-02-03 DIAGNOSIS — E559 Vitamin D deficiency, unspecified: Secondary | ICD-10-CM | POA: Diagnosis not present

## 2015-02-03 DIAGNOSIS — E785 Hyperlipidemia, unspecified: Secondary | ICD-10-CM | POA: Diagnosis not present

## 2015-02-03 DIAGNOSIS — E038 Other specified hypothyroidism: Secondary | ICD-10-CM | POA: Diagnosis not present

## 2015-02-03 DIAGNOSIS — E8881 Metabolic syndrome: Secondary | ICD-10-CM

## 2015-02-03 DIAGNOSIS — I10 Essential (primary) hypertension: Secondary | ICD-10-CM | POA: Diagnosis not present

## 2015-02-03 DIAGNOSIS — Z23 Encounter for immunization: Secondary | ICD-10-CM

## 2015-02-03 DIAGNOSIS — E538 Deficiency of other specified B group vitamins: Secondary | ICD-10-CM | POA: Diagnosis not present

## 2015-02-03 LAB — POCT CBC
Granulocyte percent: 59.4 %G (ref 37–80)
HCT, POC: 42.1 % (ref 37.7–47.9)
HEMOGLOBIN: 13.3 g/dL (ref 12.2–16.2)
Lymph, poc: 1.6 (ref 0.6–3.4)
MCH, POC: 27.3 pg (ref 27–31.2)
MCHC: 31.5 g/dL — AB (ref 31.8–35.4)
MCV: 86.7 fL (ref 80–97)
MPV: 7.2 fL (ref 0–99.8)
POC Granulocyte: 2.9 (ref 2–6.9)
POC LYMPH %: 32.3 % (ref 10–50)
Platelet Count, POC: 210 10*3/uL (ref 142–424)
RBC: 4.86 M/uL (ref 4.04–5.48)
RDW, POC: 14.1 %
WBC: 4.8 10*3/uL (ref 4.6–10.2)

## 2015-02-03 NOTE — Patient Instructions (Addendum)
Medicare Annual Wellness Visit  [facility] and the medical providers at [facility] strive to bring you the best medical care.  In doing so we not only want to address your current medical conditions and concerns but also to detect new conditions early and prevent illness, disease and health-related problems.    Medicare offers a yearly Wellness Visit which allows our clinical staff to assess your need for preventative services including immunizations, lifestyle education, counseling to decrease risk of preventable diseases and screening for fall risk and other medical concerns.    This visit is provided free of charge (no copay) for all Medicare recipients. The clinical pharmacists at [facility] have begun to conduct these Wellness Visits which will also include a thorough review of all your medications.    As you primary medical provider recommend that you make an appointment for your Annual Wellness Visit if you have not done so already this year.  You may set up this appointment before you leave today or you may call back ([phone]) and schedule an appointment.  Please make sure when you call that you mention that you are scheduling your Annual Wellness Visit with the clinical pharmacist so that the appointment may be made for the proper length of time.     Continue current medications. Continue good therapeutic lifestyle changes which include good diet and exercise. Fall precautions discussed with patient. If an FOBT was given today- please return it to our front desk. If you are over 51 years old - you may need Prevnar 31 or the adult Pneumonia vaccine.  Flu Shots are still available at our office. If you still haven't had one please call to set up a nurse visit to get one.   After your visit with [place] today you will receive a survey in the mail or online from [employer] regarding your care with [place]. Please take a moment to  fill this out. Your feedback is very important to [place] as you can help [place] better understand your patient needs as well as improve your experience and satisfaction. WE CARE ABOUT YOU!!!   The Prevnar vaccine that you received today may make your arm sore Continue to take the B12 injections once monthly When we check future B12 levels in 3-4 months make sure that it is done prior to your next B12 injection Continue to be careful and do not put yourself at risk for falling Drink plenty of fluids and keep well hydrated Watch sodium intake

## 2015-02-03 NOTE — Progress Notes (Signed)
Subjective:    Patient ID: Miranda Wilson, female    DOB: December 23, 1950, 64 y.o.   MRN: [id number]  HPI Pt here for follow up and management of chronic medical problems which includes hypertension, hyperlipidemia, and hypothyroid. She is taking medications regularly. The patient was recently diagnosed with B12 deficiency. Her symptoms at the last visit have greatly improved although she still having some numbness and tingling in her fingers. She is now going to a monthly dose of B12 injection. At some point down the road we will recheck the B12 level again just prior to a monthly injection. The patient appears to be in good spirits today. She will get her traditional lab work done today and for some reason she has not had a Prevnar vaccine and she will most likely get that also. She still complains of bilateral ongoing knee pain. A lot of the patient's symptoms from the previous visit are feeling better, her skin feels better she seems to have new hair growth in her scalp and eyebrows.        Patient Active Problem List   Diagnosis Date Noted  . B12 deficiency 12/08/2014  . Metabolic syndrome 47/06/6282  . Bradycardia 05/31/2014  . Hyperlipemia 04/28/2013  . Vitamin D deficiency 04/28/2013  . Lumbar spondylosis with myelopathy 04/28/2013  . Osteoarthritis 04/28/2013  . Hypertension 04/28/2013  . Incontinence of urine 04/28/2013  . Hypothyroidism 04/28/2013  . Cough 08/21/2011  . Abnormal CXR 08/21/2011   Outpatient Encounter Prescriptions as of 02/03/2015  Medication Sig  . aspirin 81 MG tablet Take 81 mg by mouth daily.    Marland Kitchen atenolol (TENORMIN) 50 MG tablet TAKE 1 TABLET (50 MG TOTAL) BY MOUTH DAILY. (Patient taking differently: TAKE 1 TABLET (50 MG TOTAL) BY MOUTH DAILY.--- takes in am)  . Calcium Carbonate-Vitamin D (CALTRATE 600+D) 600-400 MG-UNIT per tablet Take 1 tablet by mouth daily.  Sarajane Marek Sodium 30-100 MG CAPS Take 1 tablet by mouth daily as needed.   .  Cholecalciferol (VITAMIN D3) 1000 UNITS CAPS Take 1 capsule by mouth daily.  . Glucosamine-Chondroitin 750-600 MG TABS Take 2 tablets by mouth daily.  Marland Kitchen levothyroxine (SYNTHROID, LEVOTHROID) 150 MCG tablet Take 1 tablet (150 mcg total) by mouth daily.  . Multiple Vitamins-Minerals (CENTRUM SILVER ADULT 50+ PO) Take 1 tablet by mouth daily.  . naproxen sodium (ANAPROX) 220 MG tablet Take 220 mg by mouth as needed.  . Polyethylene Glycol 3350 (MIRALAX PO) Take by mouth as needed.    . rosuvastatin (CRESTOR) 40 MG tablet Take 20 mg by mouth every evening.  Marland Kitchen ZETIA 10 MG tablet TAKE 1 TABLET EVERY DAY  . metaxalone (SKELAXIN) 800 MG tablet Take 1 tablet (800 mg total) by mouth 3 (three) times daily. (Patient not taking: Reported on 02/03/2015)  . nabumetone (RELAFEN) 500 MG tablet Take 1 tablet (500 mg total) by mouth 2 (two) times daily as needed. (Patient not taking: Reported on 02/03/2015)  . [DISCONTINUED] cephALEXin (KEFLEX) 500 MG capsule     Review of Systems  Constitutional: Negative.   HENT: Negative.   Eyes: Negative.   Respiratory: Negative.   Cardiovascular: Negative.   Gastrointestinal: Negative.   Endocrine: Negative.   Genitourinary: Negative.   Musculoskeletal: Positive for arthralgias (on- going bilateral knee pain).  Skin: Negative.   Allergic/Immunologic: Negative.   Neurological: Positive for numbness (some finger numbness).  Hematological: Negative.   Psychiatric/Behavioral: Negative.        Objective:   Physical Exam  Constitutional:  She is oriented to person, place, and time. She appears well-developed and well-nourished. No distress.  The patient is pleasant and alert and uses a cane because of knee problems post surgery.  HENT:  Head: Normocephalic and atraumatic.  Right Ear: External ear normal.  Left Ear: External ear normal.  Nose: Nose normal.  Mouth/Throat: Oropharynx is clear and moist. No oropharyngeal exudate.  Eyes: Conjunctivae and EOM are normal.  Pupils are equal, round, and reactive to light. Right eye exhibits no discharge. Left eye exhibits no discharge. No scleral icterus.  Neck: Normal range of motion. Neck supple. No thyromegaly present.  No carotid bruits or anterior cervical adenopathy  Cardiovascular: Normal rate, regular rhythm, normal heart sounds and intact distal pulses.   No murmur heard. The heart is regular at 84/m  Pulmonary/Chest: Effort normal and breath sounds normal. No respiratory distress. She has no wheezes. She has no rales. She exhibits no tenderness.  Abdominal: Soft. Bowel sounds are normal. She exhibits no mass. There is no tenderness. There is no rebound and no guarding.  Musculoskeletal: Normal range of motion. She exhibits no edema or tenderness.  Because of knee replacement and problems with this she does use a cane and has stiffness in the knee.  Lymphadenopathy:    She has no cervical adenopathy.  Neurological: She is alert and oriented to person, place, and time. She has normal reflexes. No cranial nerve deficit.  Skin: Skin is warm and dry. No rash noted.  Psychiatric: She has a normal mood and affect. Her behavior is normal. Judgment and thought content normal.  Nursing note and vitals reviewed.   BP 142/78 mmHg  Pulse 48  Temp(Src) 97.1 F (36.2 C) (Oral)  Ht '5\' 3"'  (1.6 m)  Wt 215 lb (97.523 kg)  BMI 38.09 kg/m2  LMP 04/29/1991       Assessment & Plan:  1. Essential hypertension, benign -Blood pressures at home have been good and patient should continue her current treatment and continue to watch sodium intake - POCT CBC - BMP8+EGFR - Hepatic function panel  2. Vitamin D deficiency -The patient should continue with her current vitamin D replacement pending results of lab work being done today. - POCT CBC - Vit D  25 hydroxy (rtn osteoporosis monitoring)  3. Hyperlipemia -The patient should continue with her Crestor and zetia pending results of lab work today - POCT CBC - NMR,  lipoprofile  4. Metabolic syndrome -She should continue to work aggressively on her diet and exercise as possible. Her weight is down 2 pounds from the last visit. - POCT CBC - BMP8+EGFR  5. Other specified hypothyroidism -She should continue current thyroid dose pending results of lab work - POCT CBC  6. B12 deficiency -She is doing better with her symptoms from the previous visit because of getting B12 shots. She is now on a monthly regimen. - POCT CBC  Patient Instructions                       Medicare Annual Wellness Visit  [facility] and the medical providers at [facility] strive to bring you the best medical care.  In doing so we not only want to address your current medical conditions and concerns but also to detect new conditions early and prevent illness, disease and health-related problems.    Medicare offers a yearly Wellness Visit which allows our clinical staff to assess your need for preventative services including immunizations, lifestyle education,  counseling to decrease risk of preventable diseases and screening for fall risk and other medical concerns.    This visit is provided free of charge (no copay) for all Medicare recipients. The clinical pharmacists at [facility] have begun to conduct these Wellness Visits which will also include a thorough review of all your medications.    As you primary medical provider recommend that you make an appointment for your Annual Wellness Visit if you have not done so already this year.  You may set up this appointment before you leave today or you may call back ([phone]) and schedule an appointment.  Please make sure when you call that you mention that you are scheduling your Annual Wellness Visit with the clinical pharmacist so that the appointment may be made for the proper length of time.     Continue current medications. Continue good therapeutic lifestyle changes which include  good diet and exercise. Fall precautions discussed with patient. If an FOBT was given today- please return it to our front desk. If you are over 28 years old - you may need Prevnar 13 or the adult Pneumonia vaccine.  Flu Shots are still available at our office. If you still haven't had one please call to set up a nurse visit to get one.   After your visit with [place] today you will receive a survey in the mail or online from [employer] regarding your care with [place]. Please take a moment to fill this out. Your feedback is very important to [place] as you can help [place] better understand your patient needs as well as improve your experience and satisfaction. WE CARE ABOUT YOU!!!   The Prevnar vaccine that you received today may make your arm sore Continue to take the B12 injections once monthly When we check future B12 levels in 3-4 months make sure that it is done prior to your next B12 injection Continue to be careful and do not put yourself at risk for falling Drink plenty of fluids and keep well hydrated Watch sodium intake   Arrie Senate MD

## 2015-02-04 ENCOUNTER — Ambulatory Visit (INDEPENDENT_AMBULATORY_CARE_PROVIDER_SITE_OTHER): Payer: BLUE CROSS/BLUE SHIELD | Admitting: *Deleted

## 2015-02-04 DIAGNOSIS — E538 Deficiency of other specified B group vitamins: Secondary | ICD-10-CM | POA: Diagnosis not present

## 2015-02-04 LAB — NMR, LIPOPROFILE
CHOLESTEROL: 189 mg/dL (ref 100–199)
HDL CHOLESTEROL BY NMR: 59 mg/dL (ref >39)
HDL Particle Number: 37.8 umol/L (ref >=30.5)
LDL PARTICLE NUMBER: 1178 nmol/L — AB (ref <1000)
LDL SIZE: 21.3 nm (ref >20.5)
LDL-C: 112 mg/dL — ABNORMAL HIGH (ref 0–99)
LP-IR Score: <25 (ref <=45)
Small LDL Particle Number: 360 nmol/L (ref <=527)
TRIGLYCERIDES BY NMR: 89 mg/dL (ref 0–149)

## 2015-02-04 LAB — HEPATIC FUNCTION PANEL
ALK PHOS: 101 IU/L (ref 39–117)
ALT: 17 IU/L (ref 0–32)
AST: 24 IU/L (ref 0–40)
Albumin: 4.3 g/dL (ref 3.6–4.8)
BILIRUBIN TOTAL: 0.4 mg/dL (ref 0.0–1.2)
BILIRUBIN, DIRECT: 0.14 mg/dL (ref 0.00–0.40)
Total Protein: 6.3 g/dL (ref 6.0–8.5)

## 2015-02-04 LAB — BMP8+EGFR
BUN/Creatinine Ratio: 13 (ref 11–26)
BUN: 12 mg/dL (ref 8–27)
CO2: 26 mmol/L (ref 18–29)
Calcium: 8.9 mg/dL (ref 8.7–10.3)
Chloride: 104 mmol/L (ref 97–108)
Creatinine, Ser: 0.92 mg/dL (ref 0.57–1.00)
GFR, EST AFRICAN AMERICAN: 76 mL/min/{1.73_m2} (ref >59)
GFR, EST NON AFRICAN AMERICAN: 66 mL/min/{1.73_m2} (ref >59)
Glucose: 90 mg/dL (ref 65–99)
Potassium: 4.2 mmol/L (ref 3.5–5.2)
SODIUM: 140 mmol/L (ref 134–144)

## 2015-02-04 LAB — VITAMIN D 25 HYDROXY (VIT D DEFICIENCY, FRACTURES): Vit D, 25-Hydroxy: 37.1 ng/mL (ref 30.0–100.0)

## 2015-02-04 MED ORDER — CYANOCOBALAMIN 1000 MCG/ML IJ SOLN
1000.0000 ug | INTRAMUSCULAR | Status: AC
  Administered 2015-02-04 – 2015-07-18 (×6): 1000 ug via INTRAMUSCULAR

## 2015-02-04 NOTE — Progress Notes (Signed)
Pt given B12 injection IM left deltoid and tolerated well. °

## 2015-02-04 NOTE — Patient Instructions (Signed)

## 2015-02-07 ENCOUNTER — Telehealth: Payer: Self-pay | Admitting: *Deleted

## 2015-02-07 NOTE — Telephone Encounter (Signed)
-----   Message from Chipper Herb, MD sent at 02/04/2015  9:37 PM EDT ----- The blood sugar is good at 90. The creatinine, the most important kidney function test is within normal limits. The electrolytes including potassium are good. All liver function tests are within normal limits The total LDL particle number is improved from 4 months ago and is now 1178. The goal for this numbers that it should be less than 1000. The LDL C is also improved from 158-112 and the goal for this number is 100. The triglycerides remain good. The HDL particle number are the good cholesterol is improved also. The patient should continue with her current treatment and with as aggressive therapeutic lifestyle changes as possible. The vitamin D level is good at 37.1-----the ideal number would be between 50 and 60. If she is taking thousand units of vitamin D daily she should increase to the 2000 and take one daily.

## 2015-02-10 DIAGNOSIS — M1711 Unilateral primary osteoarthritis, right knee: Secondary | ICD-10-CM | POA: Diagnosis not present

## 2015-03-01 ENCOUNTER — Other Ambulatory Visit (INDEPENDENT_AMBULATORY_CARE_PROVIDER_SITE_OTHER): Payer: BLUE CROSS/BLUE SHIELD

## 2015-03-01 ENCOUNTER — Other Ambulatory Visit: Payer: Self-pay | Admitting: Family Medicine

## 2015-03-01 DIAGNOSIS — E538 Deficiency of other specified B group vitamins: Secondary | ICD-10-CM | POA: Diagnosis not present

## 2015-03-01 NOTE — Progress Notes (Signed)
Lab only 

## 2015-03-02 LAB — VITAMIN B12: Vitamin B-12: 387 pg/mL (ref 211–946)

## 2015-03-02 NOTE — Progress Notes (Signed)
Patient aware.

## 2015-03-08 ENCOUNTER — Ambulatory Visit (INDEPENDENT_AMBULATORY_CARE_PROVIDER_SITE_OTHER): Payer: BLUE CROSS/BLUE SHIELD | Admitting: *Deleted

## 2015-03-08 DIAGNOSIS — E538 Deficiency of other specified B group vitamins: Secondary | ICD-10-CM | POA: Diagnosis not present

## 2015-03-08 NOTE — Progress Notes (Signed)
Vitamin b12 injection given and tolerated well.  

## 2015-03-08 NOTE — Patient Instructions (Signed)

## 2015-03-22 ENCOUNTER — Other Ambulatory Visit (INDEPENDENT_AMBULATORY_CARE_PROVIDER_SITE_OTHER): Payer: BLUE CROSS/BLUE SHIELD

## 2015-03-22 DIAGNOSIS — E539 Vitamin B deficiency, unspecified: Secondary | ICD-10-CM | POA: Diagnosis not present

## 2015-03-23 LAB — VITAMIN B12: VITAMIN B 12: 435 pg/mL (ref 211–946)

## 2015-03-27 ENCOUNTER — Other Ambulatory Visit: Payer: Self-pay | Admitting: Family Medicine

## 2015-04-08 ENCOUNTER — Encounter: Payer: Self-pay | Admitting: *Deleted

## 2015-04-12 ENCOUNTER — Ambulatory Visit (INDEPENDENT_AMBULATORY_CARE_PROVIDER_SITE_OTHER): Payer: BLUE CROSS/BLUE SHIELD | Admitting: *Deleted

## 2015-04-12 DIAGNOSIS — E538 Deficiency of other specified B group vitamins: Secondary | ICD-10-CM

## 2015-04-12 NOTE — Patient Instructions (Signed)

## 2015-04-12 NOTE — Progress Notes (Signed)
Vitamin b12 injection given and tolerated well.  

## 2015-05-13 ENCOUNTER — Ambulatory Visit (INDEPENDENT_AMBULATORY_CARE_PROVIDER_SITE_OTHER): Payer: BLUE CROSS/BLUE SHIELD | Admitting: *Deleted

## 2015-05-13 DIAGNOSIS — E538 Deficiency of other specified B group vitamins: Secondary | ICD-10-CM

## 2015-05-13 NOTE — Progress Notes (Signed)
Pt tolerated inj well

## 2015-06-15 ENCOUNTER — Encounter: Payer: Self-pay | Admitting: Family Medicine

## 2015-06-15 ENCOUNTER — Ambulatory Visit (INDEPENDENT_AMBULATORY_CARE_PROVIDER_SITE_OTHER): Payer: BLUE CROSS/BLUE SHIELD | Admitting: Family Medicine

## 2015-06-15 VITALS — BP 133/83 | HR 51 | Temp 97.2°F | Ht 63.0 in | Wt 217.0 lb

## 2015-06-15 DIAGNOSIS — E785 Hyperlipidemia, unspecified: Secondary | ICD-10-CM

## 2015-06-15 DIAGNOSIS — M174 Other bilateral secondary osteoarthritis of knee: Secondary | ICD-10-CM

## 2015-06-15 DIAGNOSIS — M4716 Other spondylosis with myelopathy, lumbar region: Secondary | ICD-10-CM | POA: Diagnosis not present

## 2015-06-15 DIAGNOSIS — E559 Vitamin D deficiency, unspecified: Secondary | ICD-10-CM

## 2015-06-15 DIAGNOSIS — E8881 Metabolic syndrome: Secondary | ICD-10-CM

## 2015-06-15 DIAGNOSIS — E038 Other specified hypothyroidism: Secondary | ICD-10-CM

## 2015-06-15 DIAGNOSIS — I10 Essential (primary) hypertension: Secondary | ICD-10-CM | POA: Diagnosis not present

## 2015-06-15 DIAGNOSIS — E538 Deficiency of other specified B group vitamins: Secondary | ICD-10-CM | POA: Diagnosis not present

## 2015-06-15 MED ORDER — EZETIMIBE 10 MG PO TABS: 10.0000 mg | ORAL_TABLET | Freq: Every day | ORAL | Status: DC

## 2015-06-15 NOTE — Progress Notes (Signed)
Subjective:    Patient ID: Miranda Wilson, female    DOB: 1951-05-02, 64 y.o.   MRN: [id number]  HPI Pt here for follow up and management of chronic medical problems which includes hypertension, hyperlipidemia, and hypothyroid. She is taking medications regularly. The patient still complains of left knee pain and left ankle pain. She also has some discomfort in her right elbow. She is requesting a refill on her Zetia. She will be given an FOBT to return and we'll get lab work done today. She is also due to get her B12 shot.      Patient Active Problem List   Diagnosis Date Noted  . B12 deficiency 12/08/2014  . Metabolic syndrome 61/44/3154  . Bradycardia 05/31/2014  . Hyperlipemia 04/28/2013  . Vitamin D deficiency 04/28/2013  . Lumbar spondylosis with myelopathy 04/28/2013  . Osteoarthritis 04/28/2013  . Hypertension 04/28/2013  . Incontinence of urine 04/28/2013  . Hypothyroidism 04/28/2013  . Cough 08/21/2011  . Abnormal CXR 08/21/2011   Outpatient Encounter Prescriptions as of 06/15/2015  Medication Sig  . aspirin 81 MG tablet Take 81 mg by mouth daily.    Marland Kitchen atenolol (TENORMIN) 50 MG tablet TAKE 1 TABLET (50 MG TOTAL) BY MOUTH DAILY.  . Calcium Carbonate-Vitamin D (CALTRATE 600+D) 600-400 MG-UNIT per tablet Take 1 tablet by mouth daily.  Sarajane Marek Sodium 30-100 MG CAPS Take 1 tablet by mouth daily as needed.   . Cholecalciferol (VITAMIN D3) 1000 UNITS CAPS Take 1 capsule by mouth daily.  Marland Kitchen levothyroxine (SYNTHROID, LEVOTHROID) 150 MCG tablet Take 1 tablet (150 mcg total) by mouth daily.  . metaxalone (SKELAXIN) 800 MG tablet Take 1 tablet (800 mg total) by mouth 3 (three) times daily.  . Multiple Vitamins-Minerals (CENTRUM SILVER ADULT 50+ PO) Take 1 tablet by mouth daily.  . nabumetone (RELAFEN) 500 MG tablet Take 1 tablet (500 mg total) by mouth 2 (two) times daily as needed.  . naproxen sodium (ANAPROX) 220 MG tablet Take 220 mg by mouth as needed.  .  Polyethylene Glycol 3350 (MIRALAX PO) Take by mouth as needed.    . rosuvastatin (CRESTOR) 40 MG tablet Take 20 mg by mouth every evening.  Marland Kitchen ZETIA 10 MG tablet TAKE 1 TABLET EVERY DAY  . [DISCONTINUED] CRESTOR 40 MG tablet TAKE 1 TABLET BY MOUTH AS DIRECTED  . [DISCONTINUED] Glucosamine-Chondroitin 750-600 MG TABS Take 2 tablets by mouth daily.   Facility-Administered Encounter Medications as of 06/15/2015  Medication  . cyanocobalamin ((VITAMIN B-12)) injection 1,000 mcg     Review of Systems  Constitutional: Negative.   HENT: Negative.   Eyes: Negative.   Respiratory: Negative.   Cardiovascular: Negative.   Gastrointestinal: Negative.   Endocrine: Negative.   Genitourinary: Negative.   Musculoskeletal: Positive for arthralgias (still having left knee pain and left foot pain).       Right elbow pain - old injury  Skin: Negative.   Allergic/Immunologic: Negative.   Neurological: Negative.   Hematological: Negative.   Psychiatric/Behavioral: Negative.        Objective:   Physical Exam  Constitutional: She is oriented to person, place, and time. She appears well-developed and well-nourished. No distress.  Kind and alert  HENT:  Head: Normocephalic and atraumatic.  Right Ear: External ear normal.  Left Ear: External ear normal.  Nose: Nose normal.  Mouth/Throat: Oropharynx is clear and moist.  Eyes: Conjunctivae and EOM are normal. Pupils are equal, round, and reactive to light. Right eye exhibits no discharge. Left eye  exhibits no discharge. No scleral icterus.  Neck: Normal range of motion. Neck supple. No thyromegaly present.  No bruits or thyromegaly  Cardiovascular: Normal rate, regular rhythm and normal heart sounds.   No murmur heard. Distal pulses were difficult to palpate secondary to heavy duty support hose The rhythm is regular at 84/m  Pulmonary/Chest: Effort normal and breath sounds normal. No respiratory distress. She has no wheezes. She has no rales. She  exhibits no tenderness.  Clear anteriorly and posteriorly  Abdominal: Soft. Bowel sounds are normal. She exhibits no mass. There is tenderness. There is no rebound and no guarding.  The abdomen was obese with some epigastric and left upper quadrant tenderness but without masses. There is also some lower abdominal tenderness. There were no inguinal nodes palpable.  Musculoskeletal: She exhibits edema and tenderness.  The right lateral epicondyle was tender to palpation. There is rigidity in both knees and stiffness. There was some mild edema in both lower extremities even with the support hose.  Lymphadenopathy:    She has no cervical adenopathy.  Neurological: She is alert and oriented to person, place, and time. She has normal reflexes. No cranial nerve deficit.  Skin: Skin is warm and dry. No rash noted.  Psychiatric: She has a normal mood and affect. Her behavior is normal. Judgment and thought content normal.  Nursing note and vitals reviewed.  BP 133/83 mmHg  Pulse 51  Temp(Src) 97.2 F (36.2 C) (Oral)  Ht '5\' 3"'  (1.6 m)  Wt 217 lb (98.431 kg)  BMI 38.45 kg/m2  LMP 04/29/1991        Assessment & Plan:  1. Vitamin D deficiency -Continue current treatment pending results of lab work - Vit D  25 hydroxy (rtn osteoporosis monitoring) - CBC with Differential/Platelet  2. Essential hypertension, benign -The blood pressure is good today the patient should continue with current treatment  - BMP8+EGFR - Hepatic function panel - CBC with Differential/Platelet  3. B12 deficiency -Continue with B12 injections and patient notes these have helped her hands feel better. - CBC with Differential/Platelet  4. Hyperlipemia -Continue with his steady and Crestor - Lipid panel - CBC with Differential/Platelet  5. Metabolic syndrome -Continue with as aggressive weight loss as possible with diet and exercise as possible - BMP8+EGFR - CBC with Differential/Platelet  6. Other specified  hypothyroidism -Continue current thyroid replacement pending results of lab work - CBC with Differential/Platelet  7. Lumbar spondylosis with myelopathy -Walk and exercise as much as feasibly possible with the problems - CBC with Differential/Platelet  8. Other bilateral secondary osteoarthritis of knee -Continue follow-up with orthopedic surgeon  Meds ordered this encounter  Medications  . ezetimibe (ZETIA) 10 MG tablet    Sig: Take 1 tablet (10 mg total) by mouth daily.    Dispense:  30 tablet    Refill:  6   Patient Instructions                       Medicare Annual Wellness Visit  [facility] and the medical providers at [facility] strive to bring you the best medical care.  In doing so we not only want to address your current medical conditions and concerns but also to detect new conditions early and prevent illness, disease and health-related problems.    Medicare offers a yearly Wellness Visit which allows our clinical staff to assess your need for preventative services including immunizations, lifestyle education, counseling to decrease risk of preventable  diseases and screening for fall risk and other medical concerns.    This visit is provided free of charge (no copay) for all Medicare recipients. The clinical pharmacists at [facility] have begun to conduct these Wellness Visits which will also include a thorough review of all your medications.    As you primary medical provider recommend that you make an appointment for your Annual Wellness Visit if you have not done so already this year.  You may set up this appointment before you leave today or you may call back ([phone]) and schedule an appointment.  Please make sure when you call that you mention that you are scheduling your Annual Wellness Visit with the clinical pharmacist so that the appointment may be made for the proper length of time.     Continue current  medications. Continue good therapeutic lifestyle changes which include good diet and exercise. Fall precautions discussed with patient. If an FOBT was given today- please return it to our front desk. If you are over 19 years old - you may need Prevnar 22 or the adult Pneumonia vaccine.   After your visit with [place] today you will receive a survey in the mail or online from [employer] regarding your care with [place]. Please take a moment to fill this out. Your feedback is very important to [place] as you can help [place] better understand your patient needs as well as improve your experience and satisfaction. WE CARE ABOUT YOU!!!   Continue to follow-up with orthopedic surgeon Get tennis elbow brace as directed and use warm wet compresses on elbow 3 or 4 times daily Continue B12 shots Return the FOBT Do not forget to schedule your mammogram is important to get this done We will call you with your lab work results once those results become available Continue to be careful and do not put yourself at risk for falling    Arrie Senate MD

## 2015-06-15 NOTE — Patient Instructions (Addendum)
Medicare Annual Wellness Visit  [facility] and the medical providers at [facility] strive to bring you the best medical care.  In doing so we not only want to address your current medical conditions and concerns but also to detect new conditions early and prevent illness, disease and health-related problems.    Medicare offers a yearly Wellness Visit which allows our clinical staff to assess your need for preventative services including immunizations, lifestyle education, counseling to decrease risk of preventable diseases and screening for fall risk and other medical concerns.    This visit is provided free of charge (no copay) for all Medicare recipients. The clinical pharmacists at [facility] have begun to conduct these Wellness Visits which will also include a thorough review of all your medications.    As you primary medical provider recommend that you make an appointment for your Annual Wellness Visit if you have not done so already this year.  You may set up this appointment before you leave today or you may call back ([phone]) and schedule an appointment.  Please make sure when you call that you mention that you are scheduling your Annual Wellness Visit with the clinical pharmacist so that the appointment may be made for the proper length of time.     Continue current medications. Continue good therapeutic lifestyle changes which include good diet and exercise. Fall precautions discussed with patient. If an FOBT was given today- please return it to our front desk. If you are over 29 years old - you may need Prevnar 66 or the adult Pneumonia vaccine.   After your visit with [place] today you will receive a survey in the mail or online from [employer] regarding your care with [place]. Please take a moment to fill this out. Your feedback is very important to [place] as you can help [place] better understand your patient needs as well as  improve your experience and satisfaction. WE CARE ABOUT YOU!!!   Continue to follow-up with orthopedic surgeon Get tennis elbow brace as directed and use warm wet compresses on elbow 3 or 4 times daily Continue B12 shots Return the FOBT Do not forget to schedule your mammogram is important to get this done We will call you with your lab work results once those results become available Continue to be careful and do not put yourself at risk for falling

## 2015-06-16 ENCOUNTER — Telehealth: Payer: Self-pay | Admitting: *Deleted

## 2015-06-16 ENCOUNTER — Ambulatory Visit: Payer: BLUE CROSS/BLUE SHIELD | Admitting: Family Medicine

## 2015-06-16 LAB — CBC WITH DIFFERENTIAL/PLATELET
BASOS ABS: 0 10*3/uL (ref 0.0–0.2)
Basos: 0 %
EOS (ABSOLUTE): 0.1 10*3/uL (ref 0.0–0.4)
EOS: 2 %
HEMATOCRIT: 41.6 % (ref 34.0–46.6)
HEMOGLOBIN: 14 g/dL (ref 11.1–15.9)
IMMATURE GRANULOCYTES: 0 %
Immature Grans (Abs): 0 10*3/uL (ref 0.0–0.1)
Lymphocytes Absolute: 1.5 10*3/uL (ref 0.7–3.1)
Lymphs: 28 %
MCH: 28.7 pg (ref 26.6–33.0)
MCHC: 33.7 g/dL (ref 31.5–35.7)
MCV: 85 fL (ref 79–97)
MONOCYTES: 13 %
MONOS ABS: 0.7 10*3/uL (ref 0.1–0.9)
NEUTROS PCT: 57 %
Neutrophils Absolute: 3 10*3/uL (ref 1.4–7.0)
Platelets: 213 10*3/uL (ref 150–379)
RBC: 4.87 x10E6/uL (ref 3.77–5.28)
RDW: 14.5 % (ref 12.3–15.4)
WBC: 5.3 10*3/uL (ref 3.4–10.8)

## 2015-06-16 LAB — LIPID PANEL
CHOLESTEROL TOTAL: 181 mg/dL (ref 100–199)
Chol/HDL Ratio: 2.6 ratio units (ref 0.0–4.4)
HDL: 69 mg/dL (ref >39)
LDL Calculated: 96 mg/dL (ref 0–99)
Triglycerides: 79 mg/dL (ref 0–149)
VLDL Cholesterol Cal: 16 mg/dL (ref 5–40)

## 2015-06-16 LAB — BMP8+EGFR
BUN/Creatinine Ratio: 15 (ref 11–26)
BUN: 14 mg/dL (ref 8–27)
CALCIUM: 9.5 mg/dL (ref 8.7–10.3)
CO2: 25 mmol/L (ref 18–29)
Chloride: 102 mmol/L (ref 97–108)
Creatinine, Ser: 0.96 mg/dL (ref 0.57–1.00)
GFR calc Af Amer: 71 mL/min/{1.73_m2} (ref >59)
GFR calc non Af Amer: 62 mL/min/{1.73_m2} (ref >59)
GLUCOSE: 85 mg/dL (ref 65–99)
Potassium: 4.8 mmol/L (ref 3.5–5.2)
Sodium: 141 mmol/L (ref 134–144)

## 2015-06-16 LAB — HEPATIC FUNCTION PANEL
ALT: 12 IU/L (ref 0–32)
AST: 12 IU/L (ref 0–40)
Albumin: 4.4 g/dL (ref 3.6–4.8)
Alkaline Phosphatase: 103 IU/L (ref 39–117)
BILIRUBIN TOTAL: 0.5 mg/dL (ref 0.0–1.2)
BILIRUBIN, DIRECT: 0.15 mg/dL (ref 0.00–0.40)
TOTAL PROTEIN: 6.5 g/dL (ref 6.0–8.5)

## 2015-06-16 LAB — VITAMIN D 25 HYDROXY (VIT D DEFICIENCY, FRACTURES): Vit D, 25-Hydroxy: 30.7 ng/mL (ref 30.0–100.0)

## 2015-06-16 NOTE — Telephone Encounter (Signed)
Pt notified of results

## 2015-06-16 NOTE — Telephone Encounter (Signed)
-----   Message from Chipper Herb, MD sent at 06/16/2015  7:53 AM EDT ----- The blood sugar is good at 85. The creatinine, the most important kidney function test is within normal limits. The electrolytes including potassium are good. All liver function tests are within normal limits Cholesterol numbers with traditional lipid testing are all good and within normal limits. The LDL C cholesterol was 96 and the triglycerides are 79.------ the patient should continue with her Crestor and Zetia and with as aggressive therapeutic lifestyle changes as possible. The vitamin D level is at the low end of the normal range.------ please have her increase the vitamin D3 to the 2000 strength 1 daily The CBC has a normal white blood cell count. The hemoglobin is good at 14.0 and the platelet count is adequate.

## 2015-07-18 ENCOUNTER — Other Ambulatory Visit: Payer: BLUE CROSS/BLUE SHIELD

## 2015-07-18 ENCOUNTER — Ambulatory Visit (INDEPENDENT_AMBULATORY_CARE_PROVIDER_SITE_OTHER): Payer: BLUE CROSS/BLUE SHIELD | Admitting: *Deleted

## 2015-07-18 DIAGNOSIS — Z1212 Encounter for screening for malignant neoplasm of rectum: Secondary | ICD-10-CM

## 2015-07-18 DIAGNOSIS — E538 Deficiency of other specified B group vitamins: Secondary | ICD-10-CM

## 2015-07-18 NOTE — Patient Instructions (Signed)

## 2015-07-18 NOTE — Progress Notes (Signed)
Vitamin b12 injection given and tolerated well.  

## 2015-07-21 ENCOUNTER — Encounter: Payer: Self-pay | Admitting: Family Medicine

## 2015-07-21 LAB — FECAL OCCULT BLOOD, IMMUNOCHEMICAL: FECAL OCCULT BLD: NEGATIVE

## 2015-08-18 ENCOUNTER — Ambulatory Visit (INDEPENDENT_AMBULATORY_CARE_PROVIDER_SITE_OTHER): Payer: BLUE CROSS/BLUE SHIELD | Admitting: *Deleted

## 2015-08-18 DIAGNOSIS — Z23 Encounter for immunization: Secondary | ICD-10-CM

## 2015-08-18 DIAGNOSIS — E538 Deficiency of other specified B group vitamins: Secondary | ICD-10-CM | POA: Diagnosis not present

## 2015-08-18 MED ORDER — CYANOCOBALAMIN 1000 MCG/ML IJ SOLN
1000.0000 ug | INTRAMUSCULAR | Status: DC
  Administered 2015-08-18 – 2016-01-24 (×6): 1000 ug via INTRAMUSCULAR

## 2015-08-18 NOTE — Progress Notes (Signed)
Pt given B12 and flu shot IM today and tolerated well.

## 2015-08-18 NOTE — Patient Instructions (Signed)

## 2015-08-25 ENCOUNTER — Ambulatory Visit (INDEPENDENT_AMBULATORY_CARE_PROVIDER_SITE_OTHER): Payer: BLUE CROSS/BLUE SHIELD

## 2015-08-25 ENCOUNTER — Other Ambulatory Visit: Payer: Self-pay | Admitting: Orthopedic Surgery

## 2015-08-25 DIAGNOSIS — R52 Pain, unspecified: Secondary | ICD-10-CM

## 2015-08-25 DIAGNOSIS — M1711 Unilateral primary osteoarthritis, right knee: Secondary | ICD-10-CM | POA: Diagnosis not present

## 2015-09-19 ENCOUNTER — Ambulatory Visit (INDEPENDENT_AMBULATORY_CARE_PROVIDER_SITE_OTHER): Payer: BLUE CROSS/BLUE SHIELD | Admitting: *Deleted

## 2015-09-19 DIAGNOSIS — E538 Deficiency of other specified B group vitamins: Secondary | ICD-10-CM

## 2015-09-19 NOTE — Progress Notes (Signed)
Vitamin b12 injection given and tolerated well.  

## 2015-09-19 NOTE — Patient Instructions (Signed)

## 2015-10-14 ENCOUNTER — Telehealth: Payer: Self-pay | Admitting: Family Medicine

## 2015-10-14 DIAGNOSIS — Z01818 Encounter for other preprocedural examination: Secondary | ICD-10-CM

## 2015-10-14 NOTE — Telephone Encounter (Signed)
surg is scheduled for April - ortho will send a form - will get cardiac clearance

## 2015-10-14 NOTE — Telephone Encounter (Signed)
If this is a total knee redo, please get cardiac clearance first

## 2015-10-20 ENCOUNTER — Ambulatory Visit (INDEPENDENT_AMBULATORY_CARE_PROVIDER_SITE_OTHER): Payer: BLUE CROSS/BLUE SHIELD | Admitting: *Deleted

## 2015-10-20 DIAGNOSIS — E538 Deficiency of other specified B group vitamins: Secondary | ICD-10-CM

## 2015-10-20 NOTE — Progress Notes (Signed)
Pt tolerated inj well

## 2015-10-26 ENCOUNTER — Ambulatory Visit (INDEPENDENT_AMBULATORY_CARE_PROVIDER_SITE_OTHER): Payer: BLUE CROSS/BLUE SHIELD | Admitting: Family Medicine

## 2015-10-26 ENCOUNTER — Encounter: Payer: Self-pay | Admitting: Family Medicine

## 2015-10-26 VITALS — BP 146/79 | HR 46 | Temp 96.9°F | Ht 63.0 in | Wt 207.0 lb

## 2015-10-26 DIAGNOSIS — E8881 Metabolic syndrome: Secondary | ICD-10-CM | POA: Diagnosis not present

## 2015-10-26 DIAGNOSIS — M1711 Unilateral primary osteoarthritis, right knee: Secondary | ICD-10-CM | POA: Diagnosis not present

## 2015-10-26 DIAGNOSIS — I10 Essential (primary) hypertension: Secondary | ICD-10-CM | POA: Diagnosis not present

## 2015-10-26 DIAGNOSIS — E785 Hyperlipidemia, unspecified: Secondary | ICD-10-CM | POA: Diagnosis not present

## 2015-10-26 DIAGNOSIS — E538 Deficiency of other specified B group vitamins: Secondary | ICD-10-CM | POA: Diagnosis not present

## 2015-10-26 DIAGNOSIS — E559 Vitamin D deficiency, unspecified: Secondary | ICD-10-CM | POA: Diagnosis not present

## 2015-10-26 DIAGNOSIS — E038 Other specified hypothyroidism: Secondary | ICD-10-CM

## 2015-10-26 MED ORDER — FLUCONAZOLE 150 MG PO TABS: 150.0000 mg | ORAL_TABLET | Freq: Once | ORAL | Status: DC

## 2015-10-26 NOTE — Addendum Note (Signed)
Addended by: Selmer Dominion on: 10/26/2015 02:05 PM   Modules accepted: Orders

## 2015-10-26 NOTE — Progress Notes (Signed)
Subjective:    Patient ID: Miranda Wilson, female    DOB: 1950/12/04, 64 y.o.   MRN: [id number]  HPI Pt here for follow up and management of chronic medical problems which includes hypertension, hyperlipidemia and hypothyroid. She is taking medications regularly. Patient is concerned today that she may have a yeast infection. She also has future surgery that is upcoming planned on her right knee. The patient is pleasant and calm. She does complain of a yeast infection from having taken antibiotics recently. She is planning to have a right knee replacement in April 2017. She already has a cardiac clearance appointment scheduled in February. On review of systems, the only complaint is some shortness of breath and of course the knee pain and back pain. She says that her blood pressure readings at home are usually in the 130s over the 80s even though they're elevated here today. She will get lab work today.       Patient Active Problem List   Diagnosis Date Noted  . B12 deficiency 12/08/2014  . Metabolic syndrome 44/96/7591  . Bradycardia 05/31/2014  . Hyperlipemia 04/28/2013  . Vitamin D deficiency 04/28/2013  . Lumbar spondylosis with myelopathy 04/28/2013  . Osteoarthritis 04/28/2013  . Hypertension 04/28/2013  . Incontinence of urine 04/28/2013  . Hypothyroidism 04/28/2013  . Cough 08/21/2011  . Abnormal CXR 08/21/2011   Outpatient Encounter Prescriptions as of 10/26/2015  Medication Sig  . aspirin 81 MG tablet Take 81 mg by mouth daily.    Marland Kitchen atenolol (TENORMIN) 50 MG tablet TAKE 1 TABLET (50 MG TOTAL) BY MOUTH DAILY.  . Calcium Carbonate-Vitamin D (CALTRATE 600+D) 600-400 MG-UNIT per tablet Take 1 tablet by mouth daily.  Sarajane Marek Sodium 30-100 MG CAPS Take 1 tablet by mouth daily as needed.   . Cholecalciferol (VITAMIN D3) 1000 UNITS CAPS Take 2 capsules by mouth daily.   Marland Kitchen levothyroxine (SYNTHROID, LEVOTHROID) 150 MCG tablet Take 1 tablet (150 mcg total) by mouth  daily.  . Multiple Vitamins-Minerals (CENTRUM SILVER ADULT 50+ PO) Take 1 tablet by mouth daily.  . nabumetone (RELAFEN) 500 MG tablet Take 1 tablet (500 mg total) by mouth 2 (two) times daily as needed.  . naproxen sodium (ANAPROX) 220 MG tablet Take 220 mg by mouth as needed.  . Polyethylene Glycol 3350 (MIRALAX PO) Take by mouth as needed.    . rosuvastatin (CRESTOR) 40 MG tablet Take 20 mg by mouth every evening.  . ezetimibe (ZETIA) 10 MG tablet Take 1 tablet (10 mg total) by mouth daily. (Patient not taking: Reported on 10/26/2015)  . metaxalone (SKELAXIN) 800 MG tablet Take 1 tablet (800 mg total) by mouth 3 (three) times daily. (Patient not taking: Reported on 10/26/2015)   Facility-Administered Encounter Medications as of 10/26/2015  Medication  . cyanocobalamin ((VITAMIN B-12)) injection 1,000 mcg      Review of Systems  Constitutional: Negative.   HENT: Negative.   Eyes: Negative.   Respiratory: Negative.   Cardiovascular: Negative.   Gastrointestinal: Negative.   Endocrine: Negative.   Genitourinary: Negative.        Possible yeast infection  Musculoskeletal: Positive for arthralgias (right knee pain - upcoming surgery.).  Skin: Negative.   Allergic/Immunologic: Negative.   Neurological: Negative.   Hematological: Negative.   Psychiatric/Behavioral: Negative.        Objective:   Physical Exam  Constitutional: She is oriented to person, place, and time. She appears well-developed and well-nourished.  HENT:  Head: Normocephalic and atraumatic.  Right Ear:  External ear normal.  Left Ear: External ear normal.  Nose: Nose normal.  Mouth/Throat: Oropharynx is clear and moist.  Eyes: Conjunctivae and EOM are normal. Pupils are equal, round, and reactive to light. Right eye exhibits no discharge. Left eye exhibits no discharge. No scleral icterus.  Neck: Normal range of motion. Neck supple. No thyromegaly present.  Cardiovascular: Normal rate, regular rhythm, normal  heart sounds and intact distal pulses.   No murmur heard. The rhythm was regular at 60/m  Pulmonary/Chest: Effort normal and breath sounds normal. No respiratory distress. She has no wheezes. She has no rales. She exhibits no tenderness.  Clear anteriorly and posteriorly  Abdominal: Soft. Bowel sounds are normal. She exhibits no mass. There is no tenderness. There is no rebound and no guarding.  The abdomen was nontender to palpation and had no abdominal bruits and no liver or spleen enlargement.  Musculoskeletal: She exhibits edema and tenderness.  The patient has joint pain in the right knee at the joint line. She also has limited movement of the left knee from scar tissue of previous surgery.  Lymphadenopathy:    She has no cervical adenopathy.  Neurological: She is alert and oriented to person, place, and time. She has normal reflexes. No cranial nerve deficit.  Skin: Skin is warm and dry. No rash noted.  Psychiatric: She has a normal mood and affect. Her behavior is normal. Judgment and thought content normal.  Nursing note and vitals reviewed.   BP 146/79 mmHg  Pulse 46  Temp(Src) 96.9 F (36.1 C) (Oral)  Ht '5\' 3"'  (1.6 m)  Wt 207 lb (93.895 kg)  BMI 36.68 kg/m2  LMP 04/29/1991       Assessment & Plan:  1. Vitamin B12 deficiency -Continue current treatment pending results of lab work - CBC with Differential/Platelet  2. Vitamin D deficiency -10 you current treatment pending results of lab work - CBC with Differential/Platelet - VITAMIN D 25 Hydroxy (Vit-D Deficiency, Fractures)  3. Essential hypertension, benign -The blood pressure slightly elevated and she will return to the office for recheck of the blood pressure with home readings in a couple weeks - BMP8+EGFR - CBC with Differential/Platelet - Hepatic function panel  4. Hyperlipemia -Continue current treatment pending results of lab work - CBC with Differential/Platelet - NMR, lipoprofile  5. Other  specified hypothyroidism -No change in treatment until lab work is returned - CBC with Differential/Platelet - Thyroid Panel With TSH  6. Metabolic syndrome -Continue to work on weight as aggressively as possible - BMP8+EGFR - CBC with Differential/Platelet  7. Primary osteoarthritis of right knee -Follow-up with orthopedic surgeon as planned for complete knee replacement in April -Keep appointment for cardiac clearance in February  Meds ordered this encounter  Medications  . fluconazole (DIFLUCAN) 150 MG tablet    Sig: Take 1 tablet (150 mg total) by mouth once. Repeat in 1 week.    Dispense:  2 tablet    Refill:  2   Patient Instructions                       Medicare Annual Wellness Visit  [facility] and the medical providers at [facility] strive to bring you the best medical care.  In doing so we not only want to address your current medical conditions and concerns but also to detect new conditions early and prevent illness, disease and health-related problems.    Medicare offers a yearly Wellness Visit which allows  our clinical staff to assess your need for preventative services including immunizations, lifestyle education, counseling to decrease risk of preventable diseases and screening for fall risk and other medical concerns.    This visit is provided free of charge (no copay) for all Medicare recipients. The clinical pharmacists at [facility] have begun to conduct these Wellness Visits which will also include a thorough review of all your medications.    As you primary medical provider recommend that you make an appointment for your Annual Wellness Visit if you have not done so already this year.  You may set up this appointment before you leave today or you may call back [phone]) and schedule an appointment.  Please make sure when you call that you mention that you are scheduling your Annual Wellness Visit with the clinical  pharmacist so that the appointment may be made for the proper length of time.     Continue current medications. Continue good therapeutic lifestyle changes which include good diet and exercise. Fall precautions discussed with patient. If an FOBT was given today- please return it to our front desk. If you are over 76 years old - you may need Prevnar 35 or the adult Pneumonia vaccine.  **Flu shots are available--- please call and schedule a FLU-CLINIC appointment**  After your visit with [place] today you will receive a survey in the mail or online from [employer] regarding your care with [place]. Please take a moment to fill this out. Your feedback is very important to [place] as you can help [place] better understand your patient needs as well as improve your experience and satisfaction. WE CARE ABOUT YOU!!!   The patient should follow-up with orthopedist as planned She should get her cardiac clearance as planned If she has questions about medication that she should take and not take prior to the surgery she should get back in touch with [place] a couple weeks prior to the surgery She should continue to be as active as possible and always not putting herself at risk for falling She should return to the office in a couple weeks and bring home blood pressure readings and have the nurse to check her blood pressure at that time   Arrie Senate MD

## 2015-10-26 NOTE — Patient Instructions (Addendum)
Medicare Annual Wellness Visit  [facility] and the medical providers at [facility] strive to bring you the best medical care.  In doing so we not only want to address your current medical conditions and concerns but also to detect new conditions early and prevent illness, disease and health-related problems.    Medicare offers a yearly Wellness Visit which allows our clinical staff to assess your need for preventative services including immunizations, lifestyle education, counseling to decrease risk of preventable diseases and screening for fall risk and other medical concerns.    This visit is provided free of charge (no copay) for all Medicare recipients. The clinical pharmacists at [facility] have begun to conduct these Wellness Visits which will also include a thorough review of all your medications.    As you primary medical provider recommend that you make an appointment for your Annual Wellness Visit if you have not done so already this year.  You may set up this appointment before you leave today or you may call back [phone] and schedule an appointment.  Please make sure when you call that you mention that you are scheduling your Annual Wellness Visit with the clinical pharmacist so that the appointment may be made for the proper length of time.     Continue current medications. Continue good therapeutic lifestyle changes which include good diet and exercise. Fall precautions discussed with patient. If an FOBT was given today- please return it to our front desk. If you are over 109 years old - you may need Prevnar 61 or the adult Pneumonia vaccine.  **Flu shots are available--- please call and schedule a FLU-CLINIC appointment**  After your visit with [place] today you will receive a survey in the mail or online from [employer] regarding your care with [place]. Please take a moment to fill this out. Your feedback is very  important to [place] as you can help [place] better understand your patient needs as well as improve your experience and satisfaction. WE CARE ABOUT YOU!!!   The patient should follow-up with orthopedist as planned She should get her cardiac clearance as planned If she has questions about medication that she should take and not take prior to the surgery she should get back in touch with [place] a couple weeks prior to the surgery She should continue to be as active as possible and always not putting herself at risk for falling She should return to the office in a couple weeks and bring home blood pressure readings and have the nurse to check her blood pressure at that time

## 2015-10-27 LAB — NMR, LIPOPROFILE
Cholesterol: 193 mg/dL (ref 100–199)
HDL CHOLESTEROL BY NMR: 63 mg/dL (ref >39)
HDL PARTICLE NUMBER: 38.4 umol/L (ref >=30.5)
LDL Particle Number: 1161 nmol/L — ABNORMAL HIGH (ref <1000)
LDL SIZE: 21.7 nm (ref >20.5)
LDL-C: 111 mg/dL — ABNORMAL HIGH (ref 0–99)
LP-IR Score: 27 (ref <=45)
SMALL LDL PARTICLE NUMBER: 287 nmol/L (ref <=527)
Triglycerides by NMR: 96 mg/dL (ref 0–149)

## 2015-10-27 LAB — CBC WITH DIFFERENTIAL/PLATELET
BASOS: 1 %
Basophils Absolute: 0 10*3/uL (ref 0.0–0.2)
EOS (ABSOLUTE): 0.1 10*3/uL (ref 0.0–0.4)
EOS: 3 %
HEMATOCRIT: 39.5 % (ref 34.0–46.6)
HEMOGLOBIN: 13 g/dL (ref 11.1–15.9)
IMMATURE GRANS (ABS): 0 10*3/uL (ref 0.0–0.1)
IMMATURE GRANULOCYTES: 0 %
LYMPHS: 34 %
Lymphocytes Absolute: 1.5 10*3/uL (ref 0.7–3.1)
MCH: 28.5 pg (ref 26.6–33.0)
MCHC: 32.9 g/dL (ref 31.5–35.7)
MCV: 87 fL (ref 79–97)
MONOCYTES: 11 %
MONOS ABS: 0.5 10*3/uL (ref 0.1–0.9)
NEUTROS PCT: 51 %
Neutrophils Absolute: 2.2 10*3/uL (ref 1.4–7.0)
Platelets: 204 10*3/uL (ref 150–379)
RBC: 4.56 x10E6/uL (ref 3.77–5.28)
RDW: 15.3 % (ref 12.3–15.4)
WBC: 4.3 10*3/uL (ref 3.4–10.8)

## 2015-10-27 LAB — HEPATIC FUNCTION PANEL
ALBUMIN: 4.1 g/dL (ref 3.6–4.8)
ALT: 14 IU/L (ref 0–32)
AST: 25 IU/L (ref 0–40)
Alkaline Phosphatase: 94 IU/L (ref 39–117)
BILIRUBIN TOTAL: 0.4 mg/dL (ref 0.0–1.2)
BILIRUBIN, DIRECT: 0.14 mg/dL (ref 0.00–0.40)
Total Protein: 6.2 g/dL (ref 6.0–8.5)

## 2015-10-27 LAB — THYROID PANEL WITH TSH
FREE THYROXINE INDEX: 3.5 (ref 1.2–4.9)
T3 Uptake Ratio: 27 % (ref 24–39)
T4, Total: 13 ug/dL — ABNORMAL HIGH (ref 4.5–12.0)
TSH: 0.557 u[IU]/mL (ref 0.450–4.500)

## 2015-10-27 LAB — BMP8+EGFR
BUN/Creatinine Ratio: 17 (ref 11–26)
BUN: 15 mg/dL (ref 8–27)
CO2: 25 mmol/L (ref 18–29)
Calcium: 9 mg/dL (ref 8.7–10.3)
Chloride: 104 mmol/L (ref 96–106)
Creatinine, Ser: 0.88 mg/dL (ref 0.57–1.00)
GFR calc Af Amer: 79 mL/min/{1.73_m2} (ref >59)
GFR calc non Af Amer: 69 mL/min/{1.73_m2} (ref >59)
Glucose: 78 mg/dL (ref 65–99)
Potassium: 4.2 mmol/L (ref 3.5–5.2)
Sodium: 141 mmol/L (ref 134–144)

## 2015-10-27 LAB — VITAMIN B12: VITAMIN B 12: 952 pg/mL — AB (ref 211–946)

## 2015-10-27 LAB — VITAMIN D 25 HYDROXY (VIT D DEFICIENCY, FRACTURES): VIT D 25 HYDROXY: 29.2 ng/mL — AB (ref 30.0–100.0)

## 2015-11-11 ENCOUNTER — Ambulatory Visit (INDEPENDENT_AMBULATORY_CARE_PROVIDER_SITE_OTHER): Payer: BLUE CROSS/BLUE SHIELD | Admitting: *Deleted

## 2015-11-11 VITALS — BP 114/69 | HR 53

## 2015-11-11 DIAGNOSIS — I10 Essential (primary) hypertension: Secondary | ICD-10-CM

## 2015-11-11 NOTE — Patient Instructions (Signed)
Hypertension Hypertension, commonly called high blood pressure, is when the force of blood pumping through your arteries is too strong. Your arteries are the blood vessels that carry blood from your heart throughout your body. A blood pressure reading consists of a higher number over a lower number, such as 110/72. The higher number (systolic) is the pressure inside your arteries when your heart pumps. The lower number (diastolic) is the pressure inside your arteries when your heart relaxes. Ideally you want your blood pressure below 120/80. Hypertension forces your heart to work harder to pump blood. Your arteries may become narrow or stiff. Having untreated or uncontrolled hypertension can cause heart attack, stroke, kidney disease, and other problems. RISK FACTORS Some risk factors for high blood pressure are controllable. Others are not.  Risk factors you cannot control include:   Race. You may be at higher risk if you are African American.  Age. Risk increases with age.  Gender. Men are at higher risk than women before age 45 years. After age 65, women are at higher risk than men. Risk factors you can control include:  Not getting enough exercise or physical activity.  Being overweight.  Getting too much fat, sugar, calories, or salt in your diet.  Drinking too much alcohol. SIGNS AND SYMPTOMS Hypertension does not usually cause signs or symptoms. Extremely high blood pressure (hypertensive crisis) may cause headache, anxiety, shortness of breath, and nosebleed. DIAGNOSIS To check if you have hypertension, your health care provider will measure your blood pressure while you are seated, with your arm held at the level of your heart. It should be measured at least twice using the same arm. Certain conditions can cause a difference in blood pressure between your right and left arms. A blood pressure reading that is higher than normal on one occasion does not mean that you need treatment. If  it is not clear whether you have high blood pressure, you may be asked to return on a different day to have your blood pressure checked again. Or, you may be asked to monitor your blood pressure at home for 1 or more weeks. TREATMENT Treating high blood pressure includes making lifestyle changes and possibly taking medicine. Living a healthy lifestyle can help lower high blood pressure. You may need to change some of your habits. Lifestyle changes may include:  Following the DASH diet. This diet is high in fruits, vegetables, and whole grains. It is low in salt, red meat, and added sugars.  Keep your sodium intake below 2,300 mg per day.  Getting at least 30-45 minutes of aerobic exercise at least 4 times per week.  Losing weight if necessary.  Not smoking.  Limiting alcoholic beverages.  Learning ways to reduce stress. Your health care provider may prescribe medicine if lifestyle changes are not enough to get your blood pressure under control, and if one of the following is true:  You are 18-59 years of age and your systolic blood pressure is above 140.  You are 60 years of age or older, and your systolic blood pressure is above 150.  Your diastolic blood pressure is above 90.  You have diabetes, and your systolic blood pressure is over 140 or your diastolic blood pressure is over 90.  You have kidney disease and your blood pressure is above 140/90.  You have heart disease and your blood pressure is above 140/90. Your personal target blood pressure may vary depending on your medical conditions, your age, and other factors. HOME CARE INSTRUCTIONS    Have your blood pressure rechecked as directed by your health care provider.   Take medicines only as directed by your health care provider. Follow the directions carefully. Blood pressure medicines must be taken as prescribed. The medicine does not work as well when you skip doses. Skipping doses also puts you at risk for  problems.  Do not smoke.   Monitor your blood pressure at home as directed by your health care provider. SEEK MEDICAL CARE IF:   You think you are having a reaction to medicines taken.  You have recurrent headaches or feel dizzy.  You have swelling in your ankles.  You have trouble with your vision. SEEK IMMEDIATE MEDICAL CARE IF:  You develop a severe headache or confusion.  You have unusual weakness, numbness, or feel faint.  You have severe chest or abdominal pain.  You vomit repeatedly.  You have trouble breathing. MAKE SURE YOU:   Understand these instructions.  Will watch your condition.  Will get help right away if you are not doing well or get worse.   This information is not intended to replace advice given to you by your health care provider. Make sure you discuss any questions you have with your health care provider.   Document Released: 10/15/2005 Document Revised: 03/01/2015 Document Reviewed: 08/07/2013 Elsevier Interactive Patient Education 2016 [employer]vier Inc[employer]  

## 2015-11-11 NOTE — Progress Notes (Signed)
Patient is here for a bp check. Patients BP 114/69 mmHg  Pulse 53  LMP 04/29/1991

## 2015-11-16 ENCOUNTER — Telehealth: Payer: Self-pay | Admitting: *Deleted

## 2015-11-16 NOTE — Telephone Encounter (Signed)
Pt will monitor  And will come by and compare it to ours. - 11/22/15 with the triage nurse. At that time we can determine if it is really her BP or her home machine running too high Dr moore's notes and old BP readings are on Miranda Wilson's desk.  (Dr Laurance Flatten has written a order to add HCTZ 12.5 daily -- if truly needed)

## 2015-11-22 ENCOUNTER — Ambulatory Visit (INDEPENDENT_AMBULATORY_CARE_PROVIDER_SITE_OTHER): Payer: BLUE CROSS/BLUE SHIELD | Admitting: *Deleted

## 2015-11-22 VITALS — BP 110/71 | HR 50

## 2015-11-22 DIAGNOSIS — E538 Deficiency of other specified B group vitamins: Secondary | ICD-10-CM | POA: Diagnosis not present

## 2015-11-22 NOTE — Progress Notes (Addendum)
Vitamin b12 injection given and tolerated well.  Patient also needed a BP check. Patients BP 110/71 mmHg  Pulse 50  LMP 04/29/1991 We also checked patients BP with her machine and it was 149/85 p 50.

## 2015-11-22 NOTE — Patient Instructions (Signed)

## 2015-11-24 NOTE — Progress Notes (Signed)
This patient may need to get another machine if her blood pressure readings are and that different from ours

## 2015-11-24 NOTE — Addendum Note (Signed)
Addended by: Thana Ates on: 11/24/2015 04:10 PM   Modules accepted: Level of Service

## 2015-11-28 NOTE — Telephone Encounter (Signed)
Pt brought in more readings and had her BP monitor checked against our - hers was not calibrated.  She will get new machine and will cont. To check BP regularly  No med changes at this time.

## 2015-12-07 ENCOUNTER — Ambulatory Visit (INDEPENDENT_AMBULATORY_CARE_PROVIDER_SITE_OTHER): Payer: BLUE CROSS/BLUE SHIELD | Admitting: Cardiology

## 2015-12-07 ENCOUNTER — Encounter: Payer: Self-pay | Admitting: Cardiology

## 2015-12-07 VITALS — BP 132/74 | HR 60 | Ht 63.0 in | Wt 210.0 lb

## 2015-12-07 DIAGNOSIS — Z01810 Encounter for preprocedural cardiovascular examination: Secondary | ICD-10-CM | POA: Diagnosis not present

## 2015-12-07 DIAGNOSIS — R600 Localized edema: Secondary | ICD-10-CM | POA: Diagnosis not present

## 2015-12-07 DIAGNOSIS — I10 Essential (primary) hypertension: Secondary | ICD-10-CM

## 2015-12-07 NOTE — Patient Instructions (Signed)
Medication Instructions:  The current medical regimen is effective;  continue present plan and medications.  Follow-Up: Follow up as needed with Dr Hochrein.  If you need a refill on your cardiac medications before your next appointment, please call your pharmacy.  Thank you for choosing [facility]!!       

## 2015-12-07 NOTE — Progress Notes (Signed)
Cardiology Office Note   Date:  12/07/2015   ID:  Miranda Wilson, DOB February 05, 1951, MRN [id number]  PCP/REFERRING:  Redge Gainer, MD  Cardiologist:   Minus Breeding, MD   No chief complaint on file.     History of Present Illness: Miranda Wilson is a 65 y.o. female who presents for evaluation of multiple cardiovascular risk factors. She is preop for knee surgery. She's not had any cardiac issues. Her only vascular issue has been significant venous insufficiency. She is limited her activities because of joint pain. However, she can push a vacuum and do her household chores without any cardiovascular symptoms. The patient denies any new symptoms such as chest discomfort, neck or arm discomfort. There has been no new shortness of breath, PND or orthopnea. There have been no reported palpitations, presyncope or syncope.  She does have lower externally swelling left greater than right and some discomfort with varicosities Lasix. This has been a chronic problem.  Past Medical History  Diagnosis Date  . Hyperlipidemia   . Hypertension   . IBS (irritable bowel syndrome)   . Varicose veins   . PMB (postmenopausal bleeding)   . History of idiopathic seizure     AGE 50 to 20  X5  --  UNKNOW IDIOLOGY , PER PT WAS ANEMIC AT THE TIME--  NONE SINCE  . S/P radioactive iodine thyroid ablation   . History of tachycardia     S/P  RADIOACTIVE IODINE ABLATION OF THYROID  . History of colon polyps   . Cervical spondylosis   . OA (osteoarthritis)     shoulders  left > right  . GERD (gastroesophageal reflux disease)   . Chronic constipation   . Urge urinary incontinence   . Bilateral edema of lower extremity     left > right --  wear compression hose  . Hypothyroidism, postradioiodine therapy     AGE 84  . At risk for sleep apnea   . Vein disorder     LEFT ANKLE VEIN VALVE REFLUX WITH DECREASED CIRCULATION     Past Surgical History  Procedure Laterality Date  . Varicose vein surgery  1998  to 2010    includes laser and phlebectomies  . Cesarean section  1979  . Endovenous ablation saphenous vein w/ laser  04/2007  . Tonsillectomy  1955  . Colonoscopy w/ polypectomy  10-25-2008  . Total knee arthroplasty Left 12-19-2009  . Closed left knee manipulation and right knee cortisone injection  02-20-2010  . Left knee arthrotomy w/ lysis adhesions  12-01-2010  . Laparoscopic cholecystectomy  1993    and BILATERAL TUBAL LIGATION  . Cataract extraction w/ intraocular lens  implant, bilateral  right 2011//  left 2013  . Hysteroscopy w/d&c N/A 11/11/2014    Procedure: DILATATION AND CURETTAGE /HYSTEROSCOPY;  Surgeon: Gus Height, MD;  Location: [facility];  Service: Gynecology;  Laterality: N/A;     Current Outpatient Prescriptions  Medication Sig Dispense Refill  . aspirin 81 MG tablet Take 81 mg by mouth daily.      Marland Kitchen atenolol (TENORMIN) 50 MG tablet TAKE 1 TABLET (50 MG TOTAL) BY MOUTH DAILY. 30 tablet 6  . Calcium Carbonate-Vitamin D (CALTRATE 600+D) 600-400 MG-UNIT per tablet Take 1 tablet by mouth daily.    Sarajane Marek Sodium 30-100 MG CAPS Take 1 tablet by mouth daily as needed.     . Cholecalciferol (VITAMIN D3) 1000 UNITS CAPS Take 2 capsules by mouth daily.     Marland Kitchen  levothyroxine (SYNTHROID, LEVOTHROID) 150 MCG tablet Take 1 tablet (150 mcg total) by mouth daily. 90 tablet 3  . metaxalone (SKELAXIN) 800 MG tablet Take 1 tablet (800 mg total) by mouth 3 (three) times daily. 30 tablet 1  . Multiple Vitamins-Minerals (CENTRUM SILVER ADULT 50+ PO) Take 1 tablet by mouth daily.    . nabumetone (RELAFEN) 500 MG tablet Take 1 tablet (500 mg total) by mouth 2 (two) times daily as needed. 60 tablet 1  . naproxen sodium (ANAPROX) 220 MG tablet Take 220 mg by mouth as needed.    . Polyethylene Glycol 3350 (MIRALAX PO) Take by mouth as needed.      . rosuvastatin (CRESTOR) 40 MG tablet Take 20 mg by mouth every evening.    . ezetimibe (ZETIA) 10 MG tablet Take 1  tablet (10 mg total) by mouth daily. (Patient not taking: Reported on 10/26/2015) 30 tablet 6   Current Facility-Administered Medications  Medication Dose Route Frequency Provider Last Rate Last Dose  . cyanocobalamin ((VITAMIN B-12)) injection 1,000 mcg  1,000 mcg Intramuscular Q30 days Chipper Herb, MD   1,000 mcg at 11/22/15 [id number]    Allergies:   Clindamycin/lincomycin; Codeine; Crestor; Lipitor; Pravachol; Simvastatin; Ultram; Vioxx; Feldene; Myrbetriq; Penicillins; and Toviaz    Social History:  The patient  reports that she has never smoked. She has never used smokeless tobacco. She reports that she does not drink alcohol or use illicit drugs.   Family History:  The patient's family history includes Arthritis in her brother; Asthma in her father; CAD (age of onset: 78) in her paternal grandmother; COPD in her mother; Diabetes Mellitus II in her brother; Emphysema in her father; Glaucoma in her mother; Heart attack (age of onset: 33) in her brother; Hyperlipidemia in her brother, brother, brother, and brother; Myasthenia gravis in her father; Parkinsonism in her mother.    ROS:  Please see the history of present illness.   Otherwise, review of systems are positive for none.   All other systems are reviewed and negative.    PHYSICAL EXAM: VS:  BP 132/74 mmHg  Pulse 60  Ht 5\' 3"  (1.6 m)  Wt 210 lb (95.255 kg)  BMI 37.21 kg/m2  SpO2 96%  LMP 04/29/1991 , BMI Body mass index is 37.21 kg/(m^2). GENERAL:  Well appearing HEENT:  Pupils equal round and reactive, fundi not visualized, oral mucosa unremarkable NECK:  No jugular venous distention, waveform within normal limits, carotid upstroke brisk and symmetric, no bruits, no thyromegaly LYMPHATICS:  No cervical, inguinal adenopathy LUNGS:  Clear to auscultation bilaterally BACK:  No CVA tenderness CHEST:  Unremarkable HEART:  PMI not displaced or sustained,S1 and S2 within normal limits, no S3, no S4, no clicks, no rubs, no  murmurs ABD:  Flat, positive bowel sounds normal in frequency in pitch, no bruits, no rebound, no guarding, no midline pulsatile mass, no hepatomegaly, no splenomegaly EXT:  2 plus pulses throughout, no edema, no cyanosis no clubbing SKIN:  No rashes no nodules NEURO:  Cranial nerves II through XII grossly intact, motor grossly intact throughout PSYCH:  Cognitively intact, oriented to person place and time    EKG:  EKG is ordered today. The ekg ordered today demonstrates sinus tachycardia, rate 1 axis within normal, intervals within normal limits, no acute ST-T wave changes.   Recent Labs: 02/03/2015: Hemoglobin 13.3 10/26/2015: ALT 14; BUN 15; Creatinine, Ser 0.88; Platelets 204; Potassium 4.2; Sodium 141; TSH 0.557    Lipid Panel    Component  Value Date/Time   CHOL 193 10/26/2015 1143   CHOL 181 06/15/2015 1102   CHOL 159 04/28/2013 1816   TRIG 96 10/26/2015 1143   TRIG 79 06/15/2015 1102   TRIG 84 04/28/2013 1816   HDL 63 10/26/2015 1143   HDL 69 06/15/2015 1102   HDL 60 04/28/2013 1816   CHOLHDL 2.6 06/15/2015 1102   LDLCALC 96 06/15/2015 1102   LDLCALC 92 05/31/2014 1234   LDLCALC 82 04/28/2013 1816      Wt Readings from Last 3 Encounters:  12/07/15 210 lb (95.255 kg)  10/26/15 207 lb (93.895 kg)  06/15/15 217 lb (98.431 kg)      Other studies Reviewed: Additional studies/ records that were reviewed today include: None. Review of the above records demonstrates:  Please see elsewhere in the note.     ASSESSMENT AND PLAN:  PREOP:  The patient has a moderate functional level. She's not going for a high-risk procedure. She doesn't have any high-risk findings store features.  Therefore, based on ACC/AHA guidelines, the patient would be at acceptable risk for the planned procedure without further cardiovascular testing.  EDEMA:  She has some chronic edema but good pedal pulses. I suspect this is old venous insufficiency, immobility and weight. This will be managed  conservatively.  HTN:  The blood pressure is at target. No change in medications is indicated. We will continue with therapeutic lifestyle changes (TLC).   Current medicines are reviewed at length with the patient today.  The patient does not have concerns regarding medicines.  The following changes have been made:  no change  Labs/ tests ordered today include: None  No orders of the defined types were placed in this encounter.     Disposition:   FU with me as needed.      Signed, Minus Breeding, MD  12/07/2015 2:54 PM    [facility]

## 2015-12-13 ENCOUNTER — Telehealth: Payer: Self-pay | Admitting: Family Medicine

## 2015-12-13 NOTE — Telephone Encounter (Signed)
Pt had questions about her next pneumonia vaccine. She is having surgery 4/17 and wasn't sure if she should get Pneumovax 10 days prior to surgery, advised pt to clear it with her surgeon. Pt voiced understanding.

## 2015-12-23 ENCOUNTER — Ambulatory Visit (INDEPENDENT_AMBULATORY_CARE_PROVIDER_SITE_OTHER): Payer: BLUE CROSS/BLUE SHIELD

## 2015-12-23 DIAGNOSIS — E538 Deficiency of other specified B group vitamins: Secondary | ICD-10-CM | POA: Diagnosis not present

## 2015-12-29 ENCOUNTER — Other Ambulatory Visit: Payer: Self-pay | Admitting: Family Medicine

## 2015-12-30 ENCOUNTER — Telehealth: Payer: Self-pay | Admitting: Family Medicine

## 2015-12-30 MED ORDER — ONDANSETRON 4 MG PO TBDP: 4.0000 mg | ORAL_TABLET | Freq: Three times a day (TID) | ORAL | Status: DC | PRN

## 2015-12-30 NOTE — Telephone Encounter (Signed)
Zofran sent over and pt is aware.

## 2015-12-30 NOTE — Telephone Encounter (Signed)
Let's try Zofran 4 mg 3 times a day as needed #20

## 2016-01-22 ENCOUNTER — Other Ambulatory Visit: Payer: Self-pay | Admitting: Family Medicine

## 2016-01-24 ENCOUNTER — Ambulatory Visit (INDEPENDENT_AMBULATORY_CARE_PROVIDER_SITE_OTHER): Payer: BLUE CROSS/BLUE SHIELD | Admitting: *Deleted

## 2016-01-24 DIAGNOSIS — E538 Deficiency of other specified B group vitamins: Secondary | ICD-10-CM

## 2016-01-24 NOTE — Progress Notes (Signed)
Vitamin b12 injection given and tolerated well.  

## 2016-01-24 NOTE — Patient Instructions (Signed)

## 2016-01-31 ENCOUNTER — Encounter: Payer: Self-pay | Admitting: Family Medicine

## 2016-01-31 ENCOUNTER — Ambulatory Visit (INDEPENDENT_AMBULATORY_CARE_PROVIDER_SITE_OTHER): Payer: BLUE CROSS/BLUE SHIELD | Admitting: Family Medicine

## 2016-01-31 VITALS — BP 104/62 | HR 64 | Temp 97.2°F | Ht 63.0 in | Wt 209.4 lb

## 2016-01-31 DIAGNOSIS — I8311 Varicose veins of right lower extremity with inflammation: Secondary | ICD-10-CM | POA: Diagnosis not present

## 2016-01-31 DIAGNOSIS — I872 Venous insufficiency (chronic) (peripheral): Secondary | ICD-10-CM

## 2016-01-31 MED ORDER — TRIAMCINOLONE ACETONIDE 0.1 % EX CREA: 1.0000 "application " | TOPICAL_CREAM | Freq: Three times a day (TID) | CUTANEOUS | Status: DC

## 2016-01-31 NOTE — Progress Notes (Signed)
Subjective:  Patient ID: Miranda Wilson, female    DOB: 1950/11/05  Age: 65 y.o. MRN: [id number]  CC: blistery lesion   HPI Miranda Wilson presents for 2 days ago the patient noticed a blister on the right shin approximately two thirds of the way from knee to ankle. She put on some compression stockings and that caused it to burst open and it had just a small amount of serous fluid extruding from it. The next 2 mornings the fluid had reaccumulated. It is not painful. She said there was some redness around it initially. That has faded. She is getting ready to have knee replacement surgery on April 17 and she wants to be sure that nothing from this is going to impede her surgery from occurring on time. She denies having had any pussy drainage from it. She does have some compression stockings and history of venous stasis. She does not have a history of DVT.   History Miranda Wilson has a past medical history of Hyperlipidemia; Hypertension; IBS (irritable bowel syndrome); Varicose veins; PMB (postmenopausal bleeding); History of idiopathic seizure; S/P radioactive iodine thyroid ablation; History of tachycardia; History of colon polyps; Cervical spondylosis; OA (osteoarthritis); GERD (gastroesophageal reflux disease); Chronic constipation; Urge urinary incontinence; Bilateral edema of lower extremity; Hypothyroidism, postradioiodine therapy; At risk for sleep apnea; and Vein disorder.   She has past surgical history that includes Varicose vein surgery (1998 to 2010); Cesarean section (1979); Endovenous ablation saphenous vein w/ laser (04/2007); Tonsillectomy (1955); Colonoscopy w/ polypectomy (10-25-2008); Total knee arthroplasty (Left, 12-19-2009); CLOSED LEFT KNEE MANIPULATION AND RIGHT KNEE CORTISONE INJECTION (02-20-2010); LEFT KNEE ARTHROTOMY W/ LYSIS ADHESIONS (12-01-2010); Laparoscopic cholecystectomy [id number]); Cataract extraction w/ intraocular lens  implant, bilateral (right 2011//  left 2013); and  Hysteroscopy w/D&C (N/A, 11/11/2014).   Her family history includes Arthritis in her brother; Asthma in her father; CAD (age of onset: 29) in her paternal grandmother; COPD in her mother; Diabetes Mellitus II in her brother; Emphysema in her father; Glaucoma in her mother; Heart attack (age of onset: 88) in her brother; Hyperlipidemia in her brother, brother, brother, and brother; Myasthenia gravis in her father; Parkinsonism in her mother.She reports that she has never smoked. She has never used smokeless tobacco. She reports that she does not drink alcohol or use illicit drugs.    ROS Review of Systems  Constitutional: Negative for fever.  HENT: Negative for congestion, rhinorrhea and sore throat.   Respiratory: Negative for cough and shortness of breath.   Cardiovascular: Negative for chest pain and palpitations.  Gastrointestinal: Negative for abdominal pain.  Musculoskeletal: Negative for myalgias and arthralgias.    Objective:  BP 104/62 mmHg  Pulse 64  Temp(Src) 97.2 F (36.2 C) (Oral)  Ht 5\' 3"  (1.6 m)  Wt 209 lb 6.4 oz (94.983 kg)  BMI 37.10 kg/m2  SpO2 99%  LMP 04/29/1991  BP Readings from Last 3 Encounters:  01/31/16 104/62  12/07/15 132/74  11/22/15 110/71    Wt Readings from Last 3 Encounters:  01/31/16 209 lb 6.4 oz (94.983 kg)  12/07/15 210 lb (95.255 kg)  10/26/15 207 lb (93.895 kg)     Physical Exam  Constitutional: She appears well-developed and well-nourished. No distress.  HENT:  Head: Normocephalic.  Eyes: Pupils are equal, round, and reactive to light.  Neck: Normal range of motion.  Musculoskeletal: She exhibits tenderness (at right lower extremity for compression and range of motion of the right knee. Gait is antalgic and she uses a cane for  support of the right lower extremity.). She exhibits no edema.  Neurological: She is alert.  Skin: Skin is warm and dry. No erythema. No pallor.  3 mm vesicle at the medial aspect of the shin two thirds of  the way between the knee and the ankle. Minimal hyperemia without erythema. No crusting. Trace edema.  Psychiatric: She has a normal mood and affect.     Lab Results  Component Value Date   WBC 4.3 10/26/2015   HGB 13.3 02/03/2015   HCT 39.5 10/26/2015   PLT 204 10/26/2015   GLUCOSE 78 10/26/2015   CHOL 193 10/26/2015   TRIG 96 10/26/2015   HDL 63 10/26/2015   LDLCALC 96 06/15/2015   ALT 14 10/26/2015   AST 25 10/26/2015   NA 141 10/26/2015   K 4.2 10/26/2015   CL 104 10/26/2015   CREATININE 0.88 10/26/2015   BUN 15 10/26/2015   CO2 25 10/26/2015   TSH 0.557 10/26/2015   INR 1.12 11/22/2010   HGBA1C 5.4% 05/31/2014    No results found.  Assessment & Plan:   Miranda Wilson was seen today for blistery lesion.  Diagnoses and all orders for this visit:  Venous stasis dermatitis of right lower extremity  Other orders -     triamcinolone cream (KENALOG) 0.1 %; Apply 1 application topically 3 (three) times daily. To affected area of right leg.    Elevate, wear stockings. Keep the leg moving. Monitor for redness, swelling, pain and milky exudate  I have discontinued Ms. Barsanti's ezetimibe. I am also having her start on triamcinolone cream. Additionally, I am having her maintain her aspirin, Calcium Carbonate-Vitamin D, metaxalone, rosuvastatin, Vitamin D3, naproxen sodium, levothyroxine, nabumetone, ondansetron, atenolol, cephALEXin, docusate sodium, Propylene Glycol (SYSTANE BALANCE OP), multivitamin with minerals, polyethylene glycol, and cyanocobalamin. We will continue to administer cyanocobalamin.  Meds ordered this encounter  Medications  . triamcinolone cream (KENALOG) 0.1 %    Sig: Apply 1 application topically 3 (three) times daily. To affected area of right leg.    Dispense:  30 g    Refill:  0     Follow-up: Return if symptoms worsen or fail to improve.  Claretta Fraise, M.D.

## 2016-02-02 ENCOUNTER — Ambulatory Visit: Payer: Self-pay | Admitting: Orthopedic Surgery

## 2016-02-02 NOTE — Progress Notes (Signed)
Preoperative surgical orders have been place into the Epic hospital system for Miranda Wilson on 02/02/2016, 2:29 PM  by Mickel Crow for surgery on 02/13/16.  Preop Total Knee orders including Experal, IV Tylenol, and IV Decadron as long as there are no contraindications to the above medications. Arlee Muslim, PA-C

## 2016-02-02 NOTE — Patient Instructions (Signed)
Miranda Wilson  02/02/2016   Your procedure is scheduled on: Monday 02/13/2016  Report to [facility]  Entrance take Miranda Wilson  elevators to 3rd floor to  [facility] at  0600 AM.  Call this number if you have problems the morning of surgery [phone]   Remember: ONLY 1 PERSON MAY GO WITH YOU TO SHORT STAY TO GET  READY MORNING OF Miranda Wilson.   Do not eat food or drink liquids :After Midnight.     Take these medicines the morning of surgery with A SIP OF WATER: Atenolol, Levothyroxine                                You may not have any metal on your body including hair pins and              piercings  Do not wear jewelry, make-up, lotions, powders or perfumes, deodorant             Do not wear nail polish.  Do not shave  48 hours prior to surgery.              Men may shave face and neck.   Do not bring valuables to the hospital. [place].  Contacts, dentures or bridgework may not be worn into surgery.  Leave suitcase in the car. After surgery it may be brought to your room.     Patients discharged the day of surgery will not be allowed to drive home.  Name and phone number of your driver:  Special Instructions: N/A              Please read over the following fact sheets you were given: _____________________________________________________________________             Miranda [facility] - Preparing for Surgery Before surgery, you can play an important role.  Because skin is not sterile, your skin needs to be as free of germs as possible.  You can reduce the number of germs on your skin by washing with CHG (chlorahexidine gluconate) soap before surgery.  CHG is an antiseptic cleaner which kills germs and bonds with the skin to continue killing germs even after washing. Please DO NOT use if you have an allergy to CHG or antibacterial soaps.  If your skin becomes reddened/irritated stop using the CHG and  inform your nurse when you arrive at Short Stay. Do not shave (including legs and underarms) for at least 48 hours prior to the first CHG shower.  You may shave your face/neck. Please follow these instructions carefully:  1.  Shower with CHG Soap the night before surgery and the  morning of Surgery.  2.  If you choose to wash your hair, wash your hair first as usual with your  normal  shampoo.  3.  After you shampoo, rinse your hair and body thoroughly to remove the  shampoo.                           4.  Use CHG as you would any other liquid soap.  You can apply chg directly  to the skin and wash  Gently with a scrungie or clean washcloth.  5.  Apply the CHG Soap to your body ONLY FROM THE NECK DOWN.   Do not use on face/ open                           Wound or open sores. Avoid contact with eyes, ears mouth and genitals (private parts).                       Wash face,  Genitals (private parts) with your normal soap.             6.  Wash thoroughly, paying special attention to the area where your surgery  will be performed.  7.  Thoroughly rinse your body with warm water from the neck down.  8.  DO NOT shower/wash with your normal soap after using and rinsing off  the CHG Soap.                9.  Pat yourself dry with a clean towel.            10.  Wear clean pajamas.            11.  Place clean sheets on your bed the night of your first shower and do not  sleep with pets. Day of Surgery : Do not apply any lotions/deodorants the morning of surgery.  Please wear clean clothes to the hospital/surgery [facility].  FAILURE TO FOLLOW THESE INSTRUCTIONS MAY RESULT IN THE CANCELLATION OF YOUR SURGERY PATIENT SIGNATURE_________________________________  NURSE SIGNATURE__________________________________  ________________________________________________________________________   Miranda [facility]  An incentive spirometer is a tool that can help keep your lungs clear and  active. This tool measures how well you are filling your lungs with each breath. Taking long deep breaths may help reverse or decrease the chance of developing breathing (pulmonary) problems (especially infection) following:  A long period of time when you are unable to move or be active. BEFORE THE PROCEDURE   If the spirometer includes an indicator to show your best effort, your nurse or respiratory therapist will set it to a desired goal.  If possible, sit up straight or lean slightly forward. Try not to slouch.  Hold the incentive spirometer in an upright position. INSTRUCTIONS FOR USE   Sit on the edge of your bed if possible, or sit up as far as you can in bed or on a chair.  Hold the incentive spirometer in an upright position.  Breathe out normally.  Place the mouthpiece in your mouth and seal your lips tightly around it.  Breathe in slowly and as deeply as possible, raising the piston or the ball toward the top of the column.  Hold your breath for 3-5 seconds or for as long as possible. Allow the piston or ball to fall to the bottom of the column.  Remove the mouthpiece from your mouth and breathe out normally.  Rest for a few seconds and repeat Steps 1 through 7 at least 10 times every 1-2 hours when you are awake. Take your time and take a few normal breaths between deep breaths.  The spirometer may include an indicator to show your best effort. Use the indicator as a goal to work toward during each repetition.  After each set of 10 deep breaths, practice coughing to be sure your lungs are clear. If you have an incision (the cut made at the time of surgery),  support your incision when coughing by placing a pillow or rolled up towels firmly against it. Once you are able to get out of bed, walk around indoors and cough well. You may stop using the incentive spirometer when instructed by your caregiver.  RISKS AND COMPLICATIONS  Take your time so you do not get dizzy or  light-headed.  If you are in pain, you may need to take or ask for pain medication before doing incentive spirometry. It is harder to take a deep breath if you are having pain. AFTER USE  Rest and breathe slowly and easily.  It can be helpful to keep track of a log of your progress. Your caregiver can provide you with a simple table to help with this. If you are using the spirometer at home, follow these instructions: [place] IF:   You are having difficultly using the spirometer.  You have trouble using the spirometer as often as instructed.  Your pain medication is not giving enough relief while using the spirometer.  You develop fever of 100.5 F (38.1 C) or higher. SEEK IMMEDIATE MEDICAL CARE IF:   You cough up bloody sputum that had not been present before.  You develop fever of 102 F (38.9 C) or greater.  You develop worsening pain at or near the incision site. MAKE SURE YOU:   Understand these instructions.  Will watch your condition.  Will get help right away if you are not doing well or get worse. Document Released: 02/25/2007 Document Revised: 01/07/2012 Document Reviewed: 04/28/2007 ExitCare Patient Information 2014 Exit[employer], [place].   ________________________________________________________________________  WHAT IS A BLOOD TRANSFUSION? Blood Transfusion Information  A transfusion is the replacement of blood or some of its parts. Blood is made up of multiple cells which provide different functions.  Red blood cells carry oxygen and are used for blood loss replacement.  White blood cells fight against infection.  Platelets control bleeding.  Plasma helps clot blood.  Other blood products are available for specialized needs, such as hemophilia or other clotting disorders. BEFORE THE TRANSFUSION  Who gives blood for transfusions?   Healthy volunteers who are fully evaluated to make sure their blood is safe. This is blood bank  blood. Transfusion therapy is the safest it has ever been in the practice of medicine. Before blood is taken from a donor, a complete history is taken to make sure that person has no history of diseases nor engages in risky social behavior (examples are intravenous drug use or sexual activity with multiple partners). The donor's travel history is screened to minimize risk of transmitting infections, such as malaria. The donated blood is tested for signs of infectious diseases, such as HIV and hepatitis. The blood is then tested to be sure it is compatible with you in order to minimize the chance of a transfusion reaction. If you or a relative donates blood, this is often done in anticipation of surgery and is not appropriate for emergency situations. It takes many days to process the donated blood. RISKS AND COMPLICATIONS Although transfusion therapy is very safe and saves many lives, the main dangers of transfusion include:   Getting an infectious disease.  Developing a transfusion reaction. This is an allergic reaction to something in the blood you were given. Every precaution is taken to prevent this. The decision to have a blood transfusion has been considered carefully by your caregiver before blood is given. Blood is not given unless the benefits outweigh the risks. AFTER THE TRANSFUSION  Right after receiving a blood transfusion, you will usually feel much better and more energetic. This is especially true if your red blood cells have gotten low (anemic). The transfusion raises the level of the red blood cells which carry oxygen, and this usually causes an energy increase.  The nurse administering the transfusion will monitor you carefully for complications. HOME CARE INSTRUCTIONS  No special instructions are needed after a transfusion. You may find your energy is better. Speak with your caregiver about any limitations on activity for underlying diseases you may have. SEEK MEDICAL CARE IF:    Your condition is not improving after your transfusion.  You develop redness or irritation at the intravenous (IV) site. SEEK IMMEDIATE MEDICAL CARE IF:  Any of the following symptoms occur over the next 12 hours:  Shaking chills.  You have a temperature by mouth above 102 F (38.9 C), not controlled by medicine.  Chest, back, or muscle pain.  People around you feel you are not acting correctly or are confused.  Shortness of breath or difficulty breathing.  Dizziness and fainting.  You get a rash or develop hives.  You have a decrease in urine output.  Your urine turns a dark color or changes to pink, red, or brown. Any of the following symptoms occur over the next 10 days:  You have a temperature by mouth above 102 F (38.9 C), not controlled by medicine.  Shortness of breath.  Weakness after normal activity.  The white part of the eye turns yellow (jaundice).  You have a decrease in the amount of urine or are urinating less often.  Your urine turns a dark color or changes to pink, red, or brown. Document Released: 10/12/2000 Document Revised: 01/07/2012 Document Reviewed: 05/31/2008 [facility] Patient Information 2014 [place], [place].  _______________________________________________________________________

## 2016-02-06 ENCOUNTER — Encounter (HOSPITAL_COMMUNITY)
Admission: RE | Admit: 2016-02-06 | Discharge: 2016-02-06 | Disposition: A | Discharge status: Home / Self Care | Payer: BLUE CROSS/BLUE SHIELD | Source: Ambulatory Visit | Attending: Orthopedic Surgery | Admitting: Orthopedic Surgery

## 2016-02-06 ENCOUNTER — Encounter (HOSPITAL_COMMUNITY): Payer: Self-pay

## 2016-02-06 DIAGNOSIS — Z01812 Encounter for preprocedural laboratory examination: Secondary | ICD-10-CM | POA: Insufficient documentation

## 2016-02-06 DIAGNOSIS — R05 Cough: Secondary | ICD-10-CM | POA: Insufficient documentation

## 2016-02-06 DIAGNOSIS — R32 Unspecified urinary incontinence: Secondary | ICD-10-CM | POA: Insufficient documentation

## 2016-02-06 DIAGNOSIS — E785 Hyperlipidemia, unspecified: Secondary | ICD-10-CM | POA: Insufficient documentation

## 2016-02-06 DIAGNOSIS — I10 Essential (primary) hypertension: Secondary | ICD-10-CM | POA: Diagnosis not present

## 2016-02-06 DIAGNOSIS — M1711 Unilateral primary osteoarthritis, right knee: Secondary | ICD-10-CM | POA: Diagnosis present

## 2016-02-06 DIAGNOSIS — E538 Deficiency of other specified B group vitamins: Secondary | ICD-10-CM | POA: Diagnosis not present

## 2016-02-06 DIAGNOSIS — M4716 Other spondylosis with myelopathy, lumbar region: Secondary | ICD-10-CM | POA: Insufficient documentation

## 2016-02-06 DIAGNOSIS — E559 Vitamin D deficiency, unspecified: Secondary | ICD-10-CM | POA: Diagnosis not present

## 2016-02-06 DIAGNOSIS — R001 Bradycardia, unspecified: Secondary | ICD-10-CM | POA: Diagnosis not present

## 2016-02-06 DIAGNOSIS — E8881 Metabolic syndrome: Secondary | ICD-10-CM | POA: Insufficient documentation

## 2016-02-06 DIAGNOSIS — E039 Hypothyroidism, unspecified: Secondary | ICD-10-CM | POA: Insufficient documentation

## 2016-02-06 LAB — COMPREHENSIVE METABOLIC PANEL
ALK PHOS: 84 U/L (ref 38–126)
ALT: 17 U/L (ref 14–54)
ANION GAP: 6 (ref 5–15)
AST: 26 U/L (ref 15–41)
Albumin: 4.3 g/dL (ref 3.5–5.0)
BILIRUBIN TOTAL: 0.5 mg/dL (ref 0.3–1.2)
BUN: 16 mg/dL (ref 6–20)
CALCIUM: 9.5 mg/dL (ref 8.9–10.3)
CO2: 27 mmol/L (ref 22–32)
Chloride: 107 mmol/L (ref 101–111)
Creatinine, Ser: 0.94 mg/dL (ref 0.44–1.00)
GFR calc non Af Amer: >60 mL/min (ref >60)
Glucose, Bld: 99 mg/dL (ref 65–99)
Potassium: 5.2 mmol/L — ABNORMAL HIGH (ref 3.5–5.1)
Sodium: 140 mmol/L (ref 135–145)
TOTAL PROTEIN: 7 g/dL (ref 6.5–8.1)

## 2016-02-06 LAB — CBC
HCT: 40.6 % (ref 36.0–46.0)
HEMOGLOBIN: 13.4 g/dL (ref 12.0–15.0)
MCH: 28.2 pg (ref 26.0–34.0)
MCHC: 33 g/dL (ref 30.0–36.0)
MCV: 85.5 fL (ref 78.0–100.0)
Platelets: 190 10*3/uL (ref 150–400)
RBC: 4.75 MIL/uL (ref 3.87–5.11)
RDW: 13.6 % (ref 11.5–15.5)
WBC: 4.3 10*3/uL (ref 4.0–10.5)

## 2016-02-06 LAB — PROTIME-INR
INR: 1.15 (ref 0.00–1.49)
Prothrombin Time: 14.5 seconds (ref 11.6–15.2)

## 2016-02-06 LAB — URINALYSIS, ROUTINE W REFLEX MICROSCOPIC
Bilirubin Urine: NEGATIVE
GLUCOSE, UA: NEGATIVE mg/dL
HGB URINE DIPSTICK: NEGATIVE
Ketones, ur: NEGATIVE mg/dL
Leukocytes, UA: NEGATIVE
Nitrite: NEGATIVE
Protein, ur: NEGATIVE mg/dL
SPECIFIC GRAVITY, URINE: 1.006 (ref 1.005–1.030)
pH: 7 (ref 5.0–8.0)

## 2016-02-06 LAB — SURGICAL PCR SCREEN
MRSA, PCR: NEGATIVE
STAPHYLOCOCCUS AUREUS: NEGATIVE

## 2016-02-06 LAB — APTT: aPTT: 29 seconds (ref 24–37)

## 2016-02-06 NOTE — Progress Notes (Signed)
12/07/2015-noted in [facility]

## 2016-02-12 NOTE — Anesthesia Preprocedure Evaluation (Addendum)
Anesthesia Evaluation  Patient identified by MRN, date of birth, ID band Patient awake    Reviewed: Allergy & Precautions, NPO status , Patient's Chart, lab work & pertinent test results  History of Anesthesia Complications Negative for: history of anesthetic complications  Airway Mallampati: II  TM Distance: <3 FB Neck ROM: Full    Dental  (+) Teeth Intact, Dental Advisory Given   Pulmonary neg pulmonary ROS,    breath sounds clear to auscultation       Cardiovascular hypertension,  Rhythm:Regular Rate:Normal     Neuro/Psych negative neurological ROS     GI/Hepatic Neg liver ROS, GERD  ,  Endo/Other  Hypothyroidism   Renal/GU negative Renal ROS     Musculoskeletal   Abdominal   Peds  Hematology negative hematology ROS (+)   Anesthesia Other Findings   Reproductive/Obstetrics                           Anesthesia Physical Anesthesia Plan  ASA: II  Anesthesia Plan: Spinal   Post-op Pain Management:    Induction: Intravenous  Airway Management Planned: Simple Face Mask  Additional Equipment:   Intra-op Plan:   Post-operative Plan:   Informed Consent: I have reviewed the patients History and Physical, chart, labs and discussed the procedure including the risks, benefits and alternatives for the proposed anesthesia with the patient or authorized representative who has indicated his/her understanding and acceptance.   Dental advisory given  Plan Discussed with: CRNA and Surgeon  Anesthesia Plan Comments:        Anesthesia Quick Evaluation

## 2016-02-12 NOTE — H&P (Signed)
TOTAL KNEE ADMISSION H&P  Patient is being admitted for right total knee arthroplasty.  Subjective:  Chief Complaint:right knee pain.  HPI: Miranda Wilson, 65 y.o. female, has a history of pain and functional disability in the right knee due to arthritis and has failed non-surgical conservative treatments for greater than 12 weeks to includeNSAID's and/or analgesics, corticosteriod injections and activity modification.  Onset of symptoms was gradual, starting 5 years ago with gradually worsening course since that time. The patient noted no past surgery on the right knee(s).  Patient currently rates pain in the right knee(s) at 7 out of 10 with activity. Patient has night pain, worsening of pain with activity and weight bearing, pain that interferes with activities of daily living, pain with passive range of motion, crepitus and joint swelling.  Patient has evidence of periarticular osteophytes and joint space narrowing by imaging studies. There is no active infection.  Patient Active Problem List   Diagnosis Date Noted  . Leg edema 12/07/2015  . Preop cardiovascular exam 12/07/2015  . B12 deficiency 12/08/2014  . Metabolic syndrome [id number]  . Bradycardia 05/31/2014  . Hyperlipemia 04/28/2013  . Vitamin D deficiency 04/28/2013  . Lumbar spondylosis with myelopathy 04/28/2013  . Osteoarthritis 04/28/2013  . Hypertension 04/28/2013  . Incontinence of urine 04/28/2013  . Hypothyroidism 04/28/2013  . Cough 08/21/2011  . Abnormal CXR 08/21/2011   Past Medical History  Diagnosis Date  . Hyperlipidemia   . Hypertension   . IBS (irritable bowel syndrome)   . Varicose veins   . PMB (postmenopausal bleeding)   . History of idiopathic seizure     AGE 45 to 20  X5  --  UNKNOW IDIOLOGY , PER PT WAS ANEMIC AT THE TIME--  NONE SINCE  . S/P radioactive iodine thyroid ablation   . History of tachycardia     S/P  RADIOACTIVE IODINE ABLATION OF THYROID  . History of colon polyps   . Cervical  spondylosis   . OA (osteoarthritis)     shoulders  left > right  . GERD (gastroesophageal reflux disease)   . Chronic constipation   . Urge urinary incontinence   . Bilateral edema of lower extremity     left > right --  wear compression hose  . Hypothyroidism, postradioiodine therapy     AGE 89  . At risk for sleep apnea   . Vein disorder     LEFT ANKLE VEIN VALVE REFLUX WITH DECREASED CIRCULATION     Past Surgical History  Procedure Laterality Date  . Varicose vein surgery  1998 to 2010    includes laser and phlebectomies  . Cesarean section  1979  . Endovenous ablation saphenous vein w/ laser  04/2007  . Tonsillectomy  1955  . Colonoscopy w/ polypectomy  10-25-2008  . Total knee arthroplasty Left 12-19-2009  . Closed left knee manipulation and right knee cortisone injection  02-20-2010  . Left knee arthrotomy w/ lysis adhesions  12-01-2010  . Laparoscopic cholecystectomy  1993    and BILATERAL TUBAL LIGATION  . Cataract extraction w/ intraocular lens  implant, bilateral  right 2011//  left 2013  . Hysteroscopy w/d&c N/A 11/11/2014    Procedure: DILATATION AND CURETTAGE /HYSTEROSCOPY;  Surgeon: Gus Height, MD;  Location: [facility];  Service: Gynecology;  Laterality: N/A;  . Eye surgery    . Dilation and curettage of uterus    . Joint replacement        Current outpatient prescriptions:  .  aspirin 81 MG tablet, Take 81 mg by mouth daily.  , Disp: , Rfl:  .  atenolol (TENORMIN) 50 MG tablet, TAKE 1 TABLET (50 MG TOTAL) BY MOUTH DAILY., Disp: 30 tablet, Rfl: 2 .  Calcium Carbonate-Vitamin D (CALTRATE 600+D) 600-400 MG-UNIT per tablet, Take 1 tablet by mouth daily., Disp: , Rfl:  .  cephALEXin (KEFLEX) 500 MG capsule, Take 2,000 mg by mouth daily as needed (Dental appoitment). Reported on 01/31/2016, Disp: , Rfl:  .  Cholecalciferol (VITAMIN D3) 1000 UNITS CAPS, Take 2 capsules by mouth daily. , Disp: , Rfl:  .  cyanocobalamin (,VITAMIN B-12,) 1000 MCG/ML  injection, Inject 1,000 mcg into the muscle every 30 (thirty) days., Disp: , Rfl:  .  docusate sodium (COLACE) 100 MG capsule, Take 100 mg by mouth daily., Disp: , Rfl:  .  levothyroxine (SYNTHROID, LEVOTHROID) 150 MCG tablet, Take 1 tablet (150 mcg total) by mouth daily., Disp: 90 tablet, Rfl: 3 .  metaxalone (SKELAXIN) 800 MG tablet, Take 1 tablet (800 mg total) by mouth 3 (three) times daily. (Patient not taking: Reported on 01/31/2016), Disp: 30 tablet, Rfl: 1 .  Multiple Vitamin (MULTIVITAMIN WITH MINERALS) TABS tablet, Take 1 tablet by mouth daily., Disp: , Rfl:  .  nabumetone (RELAFEN) 500 MG tablet, TAKE 1 TABLET (500 MG TOTAL) BY MOUTH 2 (TWO) TIMES DAILY AS NEEDED. (Patient not taking: Reported on 01/31/2016), Disp: 60 tablet, Rfl: 1 .  naproxen sodium (ANAPROX) 220 MG tablet, Take 220 mg by mouth 2 (two) times daily as needed (Pain). , Disp: , Rfl:  .  ondansetron (ZOFRAN ODT) 4 MG disintegrating tablet, Take 1 tablet (4 mg total) by mouth every 8 (eight) hours as needed for nausea or vomiting. (Patient not taking: Reported on 01/31/2016), Disp: 20 tablet, Rfl: 0 .  polyethylene glycol (MIRALAX / GLYCOLAX) packet, Take 17 g by mouth daily as needed for mild constipation. Reported on 01/31/2016, Disp: , Rfl:  .  Propylene Glycol (SYSTANE BALANCE OP), Apply 1 drop to eye 2 (two) times daily as needed (Dry eyes). Reported on 01/31/2016, Disp: , Rfl:  .  rosuvastatin (CRESTOR) 40 MG tablet, Take 20 mg by mouth every evening., Disp: , Rfl:  .  triamcinolone cream (KENALOG) 0.1 %, Apply 1 application topically 3 (three) times daily. To affected area of right leg., Disp: 30 g, Rfl: 0  Allergies  Allergen Reactions  . Clindamycin/Lincomycin Other (See Comments)    Dizzy spells  . Codeine Nausea And Vomiting  . Crestor [Rosuvastatin Calcium] Other (See Comments)    Aching flu like large dose   . Lipitor [Atorvastatin Calcium] Other (See Comments)    Numbness in feet   . Pravachol Other (See Comments)     Back pain    . Simvastatin Other (See Comments)    Back pain    . Ultram [Tramadol Hcl] Nausea And Vomiting  . Vioxx [Rofecoxib] Other (See Comments)    Fluid rentention , elevated blood pressure   . Feldene [Piroxicam] Rash  . Myrbetriq [Mirabegron] Other (See Comments)    Constipation, headache  . Penicillins Rash    Has patient had a PCN reaction causing immediate rash, facial/tongue/throat swelling, SOB or lightheadedness with hypotension: Unsure Has patient had a PCN reaction causing severe rash involving mucus membranes or skin necrosis: Unsure Has patient had a PCN reaction that required hospitalization No Has patient had a PCN reaction occurring within the last 10 years: No If all of the above answers are "NO", then  may proceed with Cephalosporin use.  Lisbeth Ply [Fesoterodine Fumarate Er] Other (See Comments)    Dry skin, dry eyes, constipation    Social History  Substance Use Topics  . Smoking status: Never Smoker   . Smokeless tobacco: Never Used  . Alcohol Use: No    Family History  Problem Relation Age of Onset  . Myasthenia gravis Father   . Asthma Father   . COPD Mother     smoked  . Glaucoma Mother   . Parkinsonism Mother   . Heart attack Brother 69  . Diabetes Mellitus II Brother   . Hyperlipidemia Brother   . Hyperlipidemia Brother   . Hyperlipidemia Brother   . Hyperlipidemia Brother   . Arthritis Brother   . Emphysema Father     smoked  . CAD Paternal Grandmother 15     Review of Systems  Constitutional: Positive for chills and malaise/fatigue. Negative for fever, weight loss and diaphoresis.  HENT: Negative for congestion, ear discharge, ear pain, hearing loss, nosebleeds, sore throat and tinnitus.   Eyes: Negative.   Respiratory: Negative.  Negative for stridor.   Cardiovascular: Negative.   Gastrointestinal: Positive for nausea and constipation. Negative for heartburn, vomiting, abdominal pain, diarrhea, blood in stool and melena.   Genitourinary: Positive for frequency. Negative for dysuria, urgency, hematuria and flank pain.  Musculoskeletal: Positive for back pain and joint pain. Negative for myalgias, falls and neck pain.  Skin: Positive for itching. Negative for rash.  Neurological: Positive for dizziness, tingling, sensory change and headaches. Negative for tremors, speech change, focal weakness, seizures, loss of consciousness and weakness.  Endo/Heme/Allergies: Positive for environmental allergies. Negative for polydipsia. Does not bruise/bleed easily.  Psychiatric/Behavioral: Negative.     Objective:  Physical Exam  Constitutional: She is oriented to person, place, and time. She appears well-developed. No distress.  Morbidly obese  HENT:  Head: Normocephalic and atraumatic.  Right Ear: External ear normal.  Left Ear: External ear normal.  Nose: Nose normal.  Mouth/Throat: Oropharynx is clear and moist.  Eyes: Conjunctivae and EOM are normal.  Neck: Normal range of motion. Neck supple.  Cardiovascular: Normal rate, regular rhythm, normal heart sounds and intact distal pulses.   No murmur heard. Respiratory: Effort normal and breath sounds normal. No respiratory distress. She has no wheezes.  GI: Soft. Bowel sounds are normal. She exhibits no distension. There is no tenderness.  Musculoskeletal:       Right hip: Normal.       Left hip: Normal.  Her right knee shows no effusion. Range about 5 to 110 degrees. There is marked crepitus on range of motion with tenderness medial greater than lateral and no instability noted.   Neurological: She is alert and oriented to person, place, and time. She has normal strength and normal reflexes. No sensory deficit.  Skin: No rash noted. She is not diaphoretic. No erythema.  Psychiatric: She has a normal mood and affect. Her behavior is normal.    Vitals  Weight: 210 lb Height: 63in Body Surface Area: 1.97 m Body Mass Index: 37.2 kg/m  Pulse: 60 (Regular)   BP: 128/76 (Sitting, Left Arm, Standard)  Imaging Review Plain radiographs demonstrate severe degenerative joint disease of the right knee(s). The overall alignment ismild varus. The bone quality appears to be good for age and reported activity level.  Assessment/Plan:  End stage primary osteoarthritis, right knee   The patient history, physical examination, clinical judgment of the provider and imaging studies are consistent with  end stage degenerative joint disease of the right knee(s) and total knee arthroplasty is deemed medically necessary. The treatment options including medical management, injection therapy arthroscopy and arthroplasty were discussed at length. The risks and benefits of total knee arthroplasty were presented and reviewed. The risks due to aseptic loosening, infection, stiffness, patella tracking problems, thromboembolic complications and other imponderables were discussed. The patient acknowledged the explanation, agreed to proceed with the plan and consent was signed. Patient is being admitted for inpatient treatment for surgery, pain control, PT, OT, prophylactic antibiotics, VTE prophylaxis, progressive ambulation and ADL's and discharge planning. The patient is planning to be discharged home with home health services vs SNF depending on progress.  Wants Debbie Dabs if she goes home with HHPT  PCP: Dr. Redge Gainer     Ardeen Jourdain, PA-C

## 2016-02-13 ENCOUNTER — Inpatient Hospital Stay (HOSPITAL_COMMUNITY): Payer: BLUE CROSS/BLUE SHIELD | Admitting: Anesthesiology

## 2016-02-13 ENCOUNTER — Encounter (HOSPITAL_COMMUNITY)
Admission: RE | Disposition: A | Discharge status: Home Health | Payer: Self-pay | Source: Ambulatory Visit | Attending: Orthopedic Surgery

## 2016-02-13 ENCOUNTER — Encounter (HOSPITAL_COMMUNITY): Payer: Self-pay | Admitting: *Deleted

## 2016-02-13 ENCOUNTER — Inpatient Hospital Stay (HOSPITAL_COMMUNITY)
Admission: RE | Admit: 2016-02-13 | Discharge: 2016-02-15 | DRG: 470 | Disposition: A | Discharge status: Home Health | Payer: BLUE CROSS/BLUE SHIELD | Source: Ambulatory Visit | Attending: Orthopedic Surgery | Admitting: Orthopedic Surgery

## 2016-02-13 DIAGNOSIS — Z6837 Body mass index (BMI) 37.0-37.9, adult: Secondary | ICD-10-CM | POA: Diagnosis not present

## 2016-02-13 DIAGNOSIS — E559 Vitamin D deficiency, unspecified: Secondary | ICD-10-CM | POA: Diagnosis present

## 2016-02-13 DIAGNOSIS — E890 Postprocedural hypothyroidism: Secondary | ICD-10-CM | POA: Diagnosis present

## 2016-02-13 DIAGNOSIS — M1711 Unilateral primary osteoarthritis, right knee: Principal | ICD-10-CM | POA: Diagnosis present

## 2016-02-13 DIAGNOSIS — K219 Gastro-esophageal reflux disease without esophagitis: Secondary | ICD-10-CM | POA: Diagnosis present

## 2016-02-13 DIAGNOSIS — Z7982 Long term (current) use of aspirin: Secondary | ICD-10-CM | POA: Diagnosis not present

## 2016-02-13 DIAGNOSIS — M179 Osteoarthritis of knee, unspecified: Secondary | ICD-10-CM | POA: Diagnosis not present

## 2016-02-13 DIAGNOSIS — Z79899 Other long term (current) drug therapy: Secondary | ICD-10-CM | POA: Diagnosis not present

## 2016-02-13 DIAGNOSIS — Z96652 Presence of left artificial knee joint: Secondary | ICD-10-CM | POA: Diagnosis present

## 2016-02-13 DIAGNOSIS — M25561 Pain in right knee: Secondary | ICD-10-CM | POA: Diagnosis not present

## 2016-02-13 DIAGNOSIS — I10 Essential (primary) hypertension: Secondary | ICD-10-CM | POA: Diagnosis present

## 2016-02-13 DIAGNOSIS — M1710 Unilateral primary osteoarthritis, unspecified knee: Secondary | ICD-10-CM | POA: Diagnosis present

## 2016-02-13 HISTORY — PX: TOTAL KNEE ARTHROPLASTY: SHX125

## 2016-02-13 LAB — TYPE AND SCREEN
ABO/RH(D): O NEG
Antibody Screen: NEGATIVE

## 2016-02-13 SURGERY — ARTHROPLASTY, KNEE, TOTAL
Anesthesia: Spinal | Site: Knee | Laterality: Right

## 2016-02-13 MED ORDER — ACETAMINOPHEN 10 MG/ML IV SOLN
1000.0000 mg | Freq: Once | INTRAVENOUS | Status: AC
  Administered 2016-02-13: 1000 mg via INTRAVENOUS
  Filled 2016-02-13: qty 100

## 2016-02-13 MED ORDER — PROPOFOL 500 MG/50ML IV EMUL
INTRAVENOUS | Status: DC | PRN
  Administered 2016-02-13: 140 ug/kg/min via INTRAVENOUS

## 2016-02-13 MED ORDER — ONDANSETRON HCL 4 MG PO TABS: 4.0000 mg | ORAL_TABLET | Freq: Four times a day (QID) | ORAL | Status: DC | PRN

## 2016-02-13 MED ORDER — ACETAMINOPHEN 500 MG PO TABS
1000.0000 mg | ORAL_TABLET | Freq: Four times a day (QID) | ORAL | Status: AC
  Administered 2016-02-13 – 2016-02-14 (×4): 1000 mg via ORAL
  Filled 2016-02-13 (×4): qty 2

## 2016-02-13 MED ORDER — HYDROMORPHONE HCL 1 MG/ML IJ SOLN: 0.2500 mg | INTRAMUSCULAR | Status: DC | PRN

## 2016-02-13 MED ORDER — ACETAMINOPHEN 325 MG PO TABS: 650.0000 mg | ORAL_TABLET | Freq: Four times a day (QID) | ORAL | Status: DC | PRN

## 2016-02-13 MED ORDER — CHLORHEXIDINE GLUCONATE 4 % EX LIQD: 60.0000 mL | Freq: Once | CUTANEOUS | Status: DC

## 2016-02-13 MED ORDER — SODIUM CHLORIDE 0.9 % IV SOLN
INTRAVENOUS | Status: DC
  Administered 2016-02-13: 1000 mL via INTRAVENOUS
  Administered 2016-02-13: 22:00:00 via INTRAVENOUS

## 2016-02-13 MED ORDER — EPHEDRINE SULFATE 50 MG/ML IJ SOLN
INTRAMUSCULAR | Status: AC
  Filled 2016-02-13: qty 1

## 2016-02-13 MED ORDER — HYDROMORPHONE HCL 1 MG/ML IJ SOLN
0.5000 mg | INTRAMUSCULAR | Status: DC | PRN
  Administered 2016-02-13: 0.5 mg via INTRAVENOUS
  Filled 2016-02-13: qty 1

## 2016-02-13 MED ORDER — BUPIVACAINE LIPOSOME 1.3 % IJ SUSP
20.0000 mL | Freq: Once | INTRAMUSCULAR | Status: DC
  Filled 2016-02-13: qty 20

## 2016-02-13 MED ORDER — 0.9 % SODIUM CHLORIDE (POUR BTL) OPTIME
TOPICAL | Status: DC | PRN
  Administered 2016-02-13: 1000 mL

## 2016-02-13 MED ORDER — EPHEDRINE SULFATE 50 MG/ML IJ SOLN
INTRAMUSCULAR | Status: DC | PRN
  Administered 2016-02-13 (×2): 5 mg via INTRAVENOUS

## 2016-02-13 MED ORDER — BUPIVACAINE LIPOSOME 1.3 % IJ SUSP
INTRAMUSCULAR | Status: DC | PRN
  Administered 2016-02-13: 20 mL

## 2016-02-13 MED ORDER — PROPOFOL 10 MG/ML IV BOLUS
INTRAVENOUS | Status: AC
  Filled 2016-02-13: qty 40

## 2016-02-13 MED ORDER — FENTANYL CITRATE (PF) 100 MCG/2ML IJ SOLN
INTRAMUSCULAR | Status: DC | PRN
  Administered 2016-02-13: 100 ug via INTRAVENOUS

## 2016-02-13 MED ORDER — SODIUM CHLORIDE 0.9 % IJ SOLN
INTRAMUSCULAR | Status: DC | PRN
  Administered 2016-02-13: 30 mL

## 2016-02-13 MED ORDER — RIVAROXABAN 10 MG PO TABS
10.0000 mg | ORAL_TABLET | Freq: Every day | ORAL | Status: DC
  Administered 2016-02-14 – 2016-02-15 (×2): 10 mg via ORAL
  Filled 2016-02-13 (×3): qty 1

## 2016-02-13 MED ORDER — MIDAZOLAM HCL 5 MG/5ML IJ SOLN
INTRAMUSCULAR | Status: DC | PRN
  Administered 2016-02-13: 2 mg via INTRAVENOUS

## 2016-02-13 MED ORDER — FENTANYL CITRATE (PF) 100 MCG/2ML IJ SOLN
INTRAMUSCULAR | Status: AC
  Filled 2016-02-13: qty 2

## 2016-02-13 MED ORDER — ACETAMINOPHEN 650 MG RE SUPP: 650.0000 mg | Freq: Four times a day (QID) | RECTAL | Status: DC | PRN

## 2016-02-13 MED ORDER — ONDANSETRON HCL 4 MG/2ML IJ SOLN
4.0000 mg | Freq: Four times a day (QID) | INTRAMUSCULAR | Status: DC | PRN
Start: 2016-02-13 — End: 2016-02-15

## 2016-02-13 MED ORDER — METHOCARBAMOL 1000 MG/10ML IJ SOLN
500.0000 mg | Freq: Four times a day (QID) | INTRAVENOUS | Status: DC | PRN
  Administered 2016-02-13: 500 mg via INTRAVENOUS
  Filled 2016-02-13 (×2): qty 5

## 2016-02-13 MED ORDER — PROPYLENE GLYCOL 0.6 % OP SOLN: Freq: Two times a day (BID) | OPHTHALMIC | Status: DC | PRN

## 2016-02-13 MED ORDER — BUPIVACAINE HCL 0.25 % IJ SOLN
INTRAMUSCULAR | Status: DC | PRN
  Administered 2016-02-13: 20 mL

## 2016-02-13 MED ORDER — DIPHENHYDRAMINE HCL 12.5 MG/5ML PO ELIX: 12.5000 mg | ORAL_SOLUTION | ORAL | Status: DC | PRN

## 2016-02-13 MED ORDER — DEXAMETHASONE SODIUM PHOSPHATE 10 MG/ML IJ SOLN
INTRAMUSCULAR | Status: AC
  Filled 2016-02-13: qty 1

## 2016-02-13 MED ORDER — SODIUM CHLORIDE 0.9 % IJ SOLN
INTRAMUSCULAR | Status: AC
  Filled 2016-02-13: qty 50

## 2016-02-13 MED ORDER — BUPIVACAINE IN DEXTROSE 0.75-8.25 % IT SOLN
INTRATHECAL | Status: DC | PRN
  Administered 2016-02-13: 2 mL via INTRATHECAL

## 2016-02-13 MED ORDER — PHENYLEPHRINE 40 MCG/ML (10ML) SYRINGE FOR IV PUSH (FOR BLOOD PRESSURE SUPPORT)
PREFILLED_SYRINGE | INTRAVENOUS | Status: AC
  Filled 2016-02-13: qty 10

## 2016-02-13 MED ORDER — DOCUSATE SODIUM 100 MG PO CAPS
100.0000 mg | ORAL_CAPSULE | Freq: Two times a day (BID) | ORAL | Status: DC
  Administered 2016-02-13 – 2016-02-15 (×4): 100 mg via ORAL

## 2016-02-13 MED ORDER — ACETAMINOPHEN 10 MG/ML IV SOLN
INTRAVENOUS | Status: AC
  Filled 2016-02-13: qty 100

## 2016-02-13 MED ORDER — CEFAZOLIN SODIUM-DEXTROSE 2-4 GM/100ML-% IV SOLN
INTRAVENOUS | Status: AC
  Filled 2016-02-13: qty 100

## 2016-02-13 MED ORDER — LIDOCAINE HCL (CARDIAC) 20 MG/ML IV SOLN
INTRAVENOUS | Status: AC
  Filled 2016-02-13: qty 5

## 2016-02-13 MED ORDER — CEFAZOLIN SODIUM-DEXTROSE 2-4 GM/100ML-% IV SOLN
2.0000 g | Freq: Four times a day (QID) | INTRAVENOUS | Status: AC
  Administered 2016-02-13 (×2): 2 g via INTRAVENOUS
  Filled 2016-02-13 (×2): qty 100

## 2016-02-13 MED ORDER — ROSUVASTATIN CALCIUM 20 MG PO TABS
20.0000 mg | ORAL_TABLET | Freq: Every evening | ORAL | Status: DC
  Administered 2016-02-13 – 2016-02-14 (×2): 20 mg via ORAL
  Filled 2016-02-13 (×3): qty 1

## 2016-02-13 MED ORDER — LACTATED RINGERS IV SOLN
INTRAVENOUS | Status: DC | PRN
  Administered 2016-02-13 (×2): via INTRAVENOUS

## 2016-02-13 MED ORDER — METOCLOPRAMIDE HCL 5 MG/ML IJ SOLN: 5.0000 mg | Freq: Three times a day (TID) | INTRAMUSCULAR | Status: DC | PRN

## 2016-02-13 MED ORDER — HYDROMORPHONE HCL 2 MG PO TABS
2.0000 mg | ORAL_TABLET | ORAL | Status: DC | PRN
  Administered 2016-02-13 (×2): 4 mg via ORAL
  Administered 2016-02-13: 2 mg via ORAL
  Administered 2016-02-14 – 2016-02-15 (×8): 4 mg via ORAL
  Filled 2016-02-13 (×10): qty 2
  Filled 2016-02-13: qty 1
  Filled 2016-02-13: qty 2

## 2016-02-13 MED ORDER — MIDAZOLAM HCL 2 MG/2ML IJ SOLN
INTRAMUSCULAR | Status: AC
  Filled 2016-02-13: qty 2

## 2016-02-13 MED ORDER — BUPIVACAINE HCL (PF) 0.25 % IJ SOLN
INTRAMUSCULAR | Status: AC
  Filled 2016-02-13: qty 30

## 2016-02-13 MED ORDER — LEVOTHYROXINE SODIUM 150 MCG PO TABS
150.0000 ug | ORAL_TABLET | Freq: Every day | ORAL | Status: DC
  Administered 2016-02-14 – 2016-02-15 (×2): 150 ug via ORAL
  Filled 2016-02-13 (×3): qty 1

## 2016-02-13 MED ORDER — ONDANSETRON HCL 4 MG/2ML IJ SOLN
INTRAMUSCULAR | Status: DC | PRN
  Administered 2016-02-13: 4 mg via INTRAVENOUS

## 2016-02-13 MED ORDER — PROPOFOL 10 MG/ML IV BOLUS
INTRAVENOUS | Status: AC
  Filled 2016-02-13: qty 20

## 2016-02-13 MED ORDER — BISACODYL 10 MG RE SUPP: 10.0000 mg | Freq: Every day | RECTAL | Status: DC | PRN

## 2016-02-13 MED ORDER — STERILE WATER FOR IRRIGATION IR SOLN
Status: DC | PRN
  Administered 2016-02-13: 2000 mL

## 2016-02-13 MED ORDER — METHOCARBAMOL 500 MG PO TABS
500.0000 mg | ORAL_TABLET | Freq: Four times a day (QID) | ORAL | Status: DC | PRN
  Administered 2016-02-13 – 2016-02-15 (×2): 500 mg via ORAL
  Filled 2016-02-13 (×3): qty 1

## 2016-02-13 MED ORDER — POLYVINYL ALCOHOL 1.4 % OP SOLN
1.0000 [drp] | Freq: Two times a day (BID) | OPHTHALMIC | Status: DC | PRN
  Filled 2016-02-13: qty 15

## 2016-02-13 MED ORDER — PHENOL 1.4 % MT LIQD: 1.0000 | OROMUCOSAL | Status: DC | PRN

## 2016-02-13 MED ORDER — METOCLOPRAMIDE HCL 10 MG PO TABS: 5.0000 mg | ORAL_TABLET | Freq: Three times a day (TID) | ORAL | Status: DC | PRN

## 2016-02-13 MED ORDER — SODIUM CHLORIDE 0.9 % IJ SOLN
INTRAMUSCULAR | Status: AC
  Filled 2016-02-13: qty 10

## 2016-02-13 MED ORDER — ONDANSETRON HCL 4 MG/2ML IJ SOLN: 4.0000 mg | Freq: Once | INTRAMUSCULAR | Status: DC

## 2016-02-13 MED ORDER — DEXAMETHASONE SODIUM PHOSPHATE 10 MG/ML IJ SOLN
10.0000 mg | Freq: Once | INTRAMUSCULAR | Status: AC
  Administered 2016-02-13: 10 mg via INTRAVENOUS

## 2016-02-13 MED ORDER — ATENOLOL 50 MG PO TABS
50.0000 mg | ORAL_TABLET | Freq: Every day | ORAL | Status: DC
  Administered 2016-02-15: 50 mg via ORAL
  Filled 2016-02-13 (×2): qty 1

## 2016-02-13 MED ORDER — SODIUM CHLORIDE 0.9 % IV SOLN: INTRAVENOUS | Status: DC

## 2016-02-13 MED ORDER — SODIUM CHLORIDE 0.9 % IR SOLN
Status: DC | PRN
  Administered 2016-02-13: 1000 mL

## 2016-02-13 MED ORDER — TRANEXAMIC ACID 1000 MG/10ML IV SOLN
1000.0000 mg | INTRAVENOUS | Status: AC
  Administered 2016-02-13: 1000 mg via INTRAVENOUS
  Filled 2016-02-13: qty 10

## 2016-02-13 MED ORDER — MENTHOL 3 MG MT LOZG: 1.0000 | LOZENGE | OROMUCOSAL | Status: DC | PRN

## 2016-02-13 MED ORDER — POLYETHYLENE GLYCOL 3350 17 G PO PACK: 17.0000 g | PACK | Freq: Every day | ORAL | Status: DC | PRN

## 2016-02-13 MED ORDER — DEXAMETHASONE SODIUM PHOSPHATE 10 MG/ML IJ SOLN
10.0000 mg | Freq: Once | INTRAMUSCULAR | Status: AC
  Administered 2016-02-14: 10 mg via INTRAVENOUS
  Filled 2016-02-13: qty 1

## 2016-02-13 MED ORDER — FLEET ENEMA 7-19 GM/118ML RE ENEM: 1.0000 | ENEMA | Freq: Once | RECTAL | Status: DC | PRN

## 2016-02-13 MED ORDER — CEFAZOLIN SODIUM-DEXTROSE 2-4 GM/100ML-% IV SOLN
2.0000 g | INTRAVENOUS | Status: AC
  Administered 2016-02-13: 2 g via INTRAVENOUS

## 2016-02-13 SURGICAL SUPPLY — 48 items
BAG SPEC THK2 15X12 ZIP CLS (MISCELLANEOUS) ×1
BAG ZIPLOCK 12X15 (MISCELLANEOUS) ×3 IMPLANT
BANDAGE ACE 6X5 VEL STRL LF (GAUZE/BANDAGES/DRESSINGS) ×3 IMPLANT
BLADE SAG 18X100X1.27 (BLADE) ×3 IMPLANT
BLADE SAW SGTL 11.0X1.19X90.0M (BLADE) ×3 IMPLANT
BOWL SMART MIX CTS (DISPOSABLE) ×3 IMPLANT
CAPT KNEE TOTAL 3 ATTUNE ×2 IMPLANT
CEMENT HV SMART SET (Cement) ×6 IMPLANT
CLOSURE WOUND 1/2 X4 (GAUZE/BANDAGES/DRESSINGS) ×2
CLOTH BEACON ORANGE TIMEOUT ST (SAFETY) ×3 IMPLANT
CUFF TOURN SGL QUICK 34 (TOURNIQUET CUFF) ×3
CUFF TRNQT CYL 34X4X40X1 (TOURNIQUET CUFF) ×1 IMPLANT
DECANTER SPIKE VIAL GLASS SM (MISCELLANEOUS) ×3 IMPLANT
DRAPE U-SHAPE 47X51 STRL (DRAPES) ×3 IMPLANT
DRSG ADAPTIC 3X8 NADH LF (GAUZE/BANDAGES/DRESSINGS) ×3 IMPLANT
DRSG PAD ABDOMINAL 8X10 ST (GAUZE/BANDAGES/DRESSINGS) ×3 IMPLANT
DURAPREP 26ML APPLICATOR (WOUND CARE) ×3 IMPLANT
ELECT REM PT RETURN 9FT ADLT (ELECTROSURGICAL) ×3
ELECTRODE REM PT RTRN 9FT ADLT (ELECTROSURGICAL) ×1 IMPLANT
EVACUATOR 1/8 PVC DRAIN (DRAIN) ×3 IMPLANT
GAUZE SPONGE 4X4 12PLY STRL (GAUZE/BANDAGES/DRESSINGS) ×3 IMPLANT
GLOVE BIO SURGEON STRL SZ8 (GLOVE) ×5 IMPLANT
GLOVE BIOGEL PI IND STRL 7.5 (GLOVE) IMPLANT
GLOVE BIOGEL PI IND STRL 8 (GLOVE) ×1 IMPLANT
GLOVE BIOGEL PI INDICATOR 7.5 (GLOVE) ×6
GLOVE BIOGEL PI INDICATOR 8 (GLOVE) ×4
GLOVE SURG SS PI 7.0 STRL IVOR (GLOVE) ×2 IMPLANT
GLOVE SURG SS PI 7.5 STRL IVOR (GLOVE) ×2 IMPLANT
GOWN STRL REUS W/TWL LRG LVL3 (GOWN DISPOSABLE) ×5 IMPLANT
GOWN STRL REUS W/TWL XL LVL3 (GOWN DISPOSABLE) ×4 IMPLANT
HANDPIECE INTERPULSE COAX TIP (DISPOSABLE) ×3
IMMOBILIZER KNEE 20 (SOFTGOODS) ×3
IMMOBILIZER KNEE 20 THIGH 36 (SOFTGOODS) ×1 IMPLANT
MANIFOLD NEPTUNE II (INSTRUMENTS) ×3 IMPLANT
PACK TOTAL KNEE CUSTOM (KITS) ×3 IMPLANT
PAD ABD 8X10 STRL (GAUZE/BANDAGES/DRESSINGS) ×2 IMPLANT
PADDING CAST COTTON 6X4 STRL (CAST SUPPLIES) ×7 IMPLANT
POSITIONER SURGICAL ARM (MISCELLANEOUS) ×3 IMPLANT
SET HNDPC FAN SPRY TIP SCT (DISPOSABLE) ×1 IMPLANT
STRIP CLOSURE SKIN 1/2X4 (GAUZE/BANDAGES/DRESSINGS) ×4 IMPLANT
SUT MNCRL AB 4-0 PS2 18 (SUTURE) ×3 IMPLANT
SUT VIC AB 2-0 CT1 27 (SUTURE) ×9
SUT VIC AB 2-0 CT1 TAPERPNT 27 (SUTURE) ×3 IMPLANT
SUT VLOC 180 0 24IN GS25 (SUTURE) ×3 IMPLANT
SYR 50ML LL SCALE MARK (SYRINGE) ×3 IMPLANT
TRAY FOLEY W/METER SILVER 14FR (SET/KITS/TRAYS/PACK) ×3 IMPLANT
WRAP KNEE MAXI GEL POST OP (GAUZE/BANDAGES/DRESSINGS) ×3 IMPLANT
YANKAUER SUCT BULB TIP 10FT TU (MISCELLANEOUS) ×3 IMPLANT

## 2016-02-13 NOTE — Progress Notes (Signed)
6th floor notifeid pt will be up in 20 minutes.

## 2016-02-13 NOTE — Evaluation (Signed)
Physical Therapy Evaluation Patient Details Name: Miranda Wilson MRN: [id number] DOB: 12/07/1950 Today's Date: 02/13/2016   History of Present Illness  s/p R TKA  PMHx: L TKA, manipulation  Clinical Impression  Pt is s/p TKA resulting in the deficits listed below (see PT Problem List).  Pt will benefit from skilled PT to increase their independence and safety with mobility to allow discharge to the venue listed below. Pt planning fo rhome with HHPT, requests Debbie Dabbs as her PT; pt did well today, amb 64' with min/guard; HR 43s, RN aware     Follow Up Recommendations Home health PT    Equipment Recommendations  None recommended by PT    Recommendations for Other Services       Precautions / Restrictions Precautions Precautions: Knee Required Braces or Orthoses: Knee Immobilizer - Left Knee Immobilizer - Left: Discontinue once straight leg raise with < 10 degree lag Restrictions Other Position/Activity Restrictions: WBAT      Mobility  Bed Mobility Overal bed mobility: Needs Assistance Bed Mobility: Supine to Sit     Supine to sit: HOB elevated;Min guard     General bed mobility comments: incr time, min/guard for safety, light facilitation RLE  Transfers Overall transfer level: Needs assistance Equipment used: Rolling walker (2 wheeled) Transfers: Sit to/from Stand Sit to Stand: Min assist;Min guard         General transfer comment: cues for hand placement and control of descent, assist to rise and stabilize in standing  Ambulation/Gait Ambulation/Gait assistance: Min assist Ambulation Distance (Feet): 40 Feet Assistive device: Rolling walker (2 wheeled) Gait Pattern/deviations: Step-to pattern;Antalgic;Trunk flexed     General Gait Details: cues for posture, sequence, RW position  Stairs            Wheelchair Mobility    Modified Rankin (Stroke Patients Only)       Balance                                              Pertinent Vitals/Pain Pain Assessment: 0-10 Pain Score: 5  Pain Location: R knee Pain Descriptors / Indicators: Aching Pain Intervention(s): Limited activity within patient's tolerance;Monitored during session;Premedicated before session;Repositioned;Ice applied    Home Living Family/patient expects to be discharged to:: Private residence Living Arrangements: Spouse/significant other   Type of Home: [place]: [place]: Environmental consultant - 2 wheels;Other (comment);Bedside commode;Cane - quad      Prior Function Level of Independence: Independent with assistive device(s)         Comments: amb with cane     Hand Dominance        Extremity/Trunk Assessment   Upper Extremity Assessment: Defer to OT evaluation;Overall WFL for tasks assessed           Lower Extremity Assessment: RLE deficits/detail;LLE deficits/detail RLE Deficits / Details: ankle WFL; knee ext and hip flexion 3/5; knee flexionto ~40*, limited by pain LLE Deficits / Details: L knee flexion to 90*     Communication   Communication: No difficulties  Cognition Arousal/Alertness: Awake/alert Behavior During Therapy: WFL for tasks assessed/performed Overall Cognitive Status: Within Functional Limits for tasks assessed                      General Comments      Exercises Total Joint Exercises  Ankle Circles/Pumps: AROM;5 reps;Both Quad Sets: 5 reps;AROM;Both      Assessment/Plan    PT Assessment Patient needs continued PT services  PT Diagnosis Difficulty walking   PT Problem List Decreased strength;Decreased range of motion;Decreased activity tolerance;Decreased mobility;Decreased knowledge of use of DME;Pain  PT Treatment Interventions DME instruction;Gait training;Functional mobility training;Therapeutic activities;Therapeutic exercise;Patient/family education   PT Goals (Current goals can be found in the Care Plan section) Acute  Rehab PT Goals Patient Stated Goal: get new knee moving PT Goal Formulation: With patient Time For Goal Achievement: 02/17/16 Potential to Achieve Goals: Good    Frequency 7X/week   Barriers to discharge        Co-evaluation               End of Session Equipment Utilized During Treatment: Gait belt Activity Tolerance: Patient tolerated treatment well Patient left: in chair;with call bell/phone within reach;with family/visitor present (no alarms available)           Time: [id number] PT Time Calculation (min) (ACUTE ONLY): 19 min   Charges:   PT Evaluation $PT Eval Low Complexity: 1 Procedure     PT G Codes:        Rayonna Heldman Mar 02, 2016, 4:04 PM

## 2016-02-13 NOTE — Anesthesia Postprocedure Evaluation (Signed)
Anesthesia Post Note  Patient: Miranda Wilson  Procedure(s) Performed: Procedure(s) (LRB): RIGHT TOTAL KNEE ARTHROPLASTY (Right)  Patient location during evaluation: PACU Anesthesia Type: Spinal Level of consciousness: oriented and awake and alert Pain management: pain level controlled Vital Signs Assessment: post-procedure vital signs reviewed and stable Respiratory status: spontaneous breathing, respiratory function stable and patient connected to nasal cannula oxygen Cardiovascular status: blood pressure returned to baseline and stable Postop Assessment: no headache, no backache and spinal receding Anesthetic complications: no    Last Vitals:  Filed Vitals:   02/13/16 1030 02/13/16 1045  BP: 125/60 132/58  Pulse: 45 46  Temp:  36.3 C  Resp: 15 14    Last Pain:  Filed Vitals:   02/13/16 1046  PainSc: 0-No pain                 Nakeesha Bowler,JAMES TERRILL

## 2016-02-13 NOTE — Transfer of Care (Signed)
Immediate Anesthesia Transfer of Care Note  Patient: Miranda Wilson  Procedure(s) Performed: Procedure(s): RIGHT TOTAL KNEE ARTHROPLASTY (Right)  Patient Location: PACU  Anesthesia Type:Spinal  Level of Consciousness: awake and alert   Airway & Oxygen Therapy: Patient Spontanous Breathing and Patient connected to face mask oxygen  Post-op Assessment: Report given to RN and Post -op Vital signs reviewed and stable  Post vital signs: Reviewed and stable  Last Vitals:  Filed Vitals:   02/13/16 0600  BP: 150/81  Pulse: 53  Temp: 36.3 C  Resp: 16    Complications: No apparent anesthesia complications

## 2016-02-13 NOTE — Op Note (Signed)
Pre-operative diagnosis- Osteoarthritis  Right knee(s)  Post-operative diagnosis- Osteoarthritis Right knee(s)  Procedure-  Right  Total Knee Arthroplasty  Surgeon- Dione Plover. Haris Baack, MD  Assistant- Molli Barrows, PA-C   Anesthesia-  Spinal  EBL-* No blood loss amount entered *   Drains Hemovac  Tourniquet time-  Total Tourniquet Time Documented: Thigh (Right) - 33 minutes Total: Thigh (Right) - 33 minutes     Complications- None  Condition-PACU - hemodynamically stable.   Brief Clinical Note  Miranda Wilson is a 65 y.o. year old female with end stage OA of her right knee with progressively worsening pain and dysfunction. She has constant pain, with activity and at rest and significant functional deficits with difficulties even with ADLs. She has had extensive non-op management including analgesics, injections of cortisone and viscosupplements, and home exercise program, but remains in significant pain with significant dysfunction.Radiographs show bone on bone arthritis medial and patellofemoral. She presents now for right Total Knee Arthroplasty.    Procedure in detail---   The patient is brought into the operating room and positioned supine on the operating table. After successful administration of  Spinal,   a tourniquet is placed high on the  Right thigh(s) and the lower extremity is prepped and draped in the usual sterile fashion. Time out is performed by the operating team and then the  Right lower extremity is wrapped in Esmarch, knee flexed and the tourniquet inflated to 300 mmHg.       A midline incision is made with a ten blade through the subcutaneous tissue to the level of the extensor mechanism. A fresh blade is used to make a medial parapatellar arthrotomy. Soft tissue over the proximal medial tibia is subperiosteally elevated to the joint line with a knife and into the semimembranosus bursa with a Cobb elevator. Soft tissue over the proximal lateral tibia is elevated with  attention being paid to avoiding the patellar tendon on the tibial tubercle. The patella is everted, knee flexed 90 degrees and the ACL and PCL are removed. Findings are bone on bone medial and patellofemoral with large global osteophytes.        The drill is used to create a starting hole in the distal femur and the canal is thoroughly irrigated with sterile saline to remove the fatty contents. The 5 degree Right  valgus alignment guide is placed into the femoral canal and the distal femoral cutting block is pinned to remove 9 mm off the distal femur. Resection is made with an oscillating saw.      The tibia is subluxed forward and the menisci are removed. The extramedullary alignment guide is placed referencing proximally at the medial aspect of the tibial tubercle and distally along the second metatarsal axis and tibial crest. The block is pinned to remove 48mm off the more deficient medial  side. Resection is made with an oscillating saw. Size 5is the most appropriate size for the tibia and the proximal tibia is prepared with the modular drill and keel punch for that size.      The femoral sizing guide is placed and size 5 is most appropriate. Rotation is marked off the epicondylar axis and confirmed by creating a rectangular flexion gap at 90 degrees. The size 5 cutting block is pinned in this rotation and the anterior, posterior and chamfer cuts are made with the oscillating saw. The intercondylar block is then placed and that cut is made.      Trial size 5 tibial component, trial  size 5 posterior stabilized femur and a 8  mm posterior stabilized rotating platform insert trial is placed. Full extension is achieved with excellent varus/valgus and anterior/posterior balance throughout full range of motion. The patella is everted and thickness measured to be 22  mm. Free hand resection is taken to 12 mm, a 38 template is placed, lug holes are drilled, trial patella is placed, and it tracks normally.  Osteophytes are removed off the posterior femur with the trial in place. All trials are removed and the cut bone surfaces prepared with pulsatile lavage. Cement is mixed and once ready for implantation, the size 5 tibial implant, size  5 posterior stabilized femoral component, and the size 38 patella are cemented in place and the patella is held with the clamp. The trial insert is placed and the knee held in full extension. The Exparel (20 ml mixed with 30 ml saline) and .25% Bupivicaine, are injected into the extensor mechanism, posterior capsule, medial and lateral gutters and subcutaneous tissues.  All extruded cement is removed and once the cement is hard the permanent 8 mm posterior stabilized rotating platform insert is placed into the tibial tray.      The wound is copiously irrigated with saline solution and the extensor mechanism closed over a hemovac drain with #1 V-loc suture. The tourniquet is released for a total tourniquet time of 33  minutes. Flexion against gravity is 140 degrees and the patella tracks normally. Subcutaneous tissue is closed with 2.0 vicryl and subcuticular with running 4.0 Monocryl. The incision is cleaned and dried and steri-strips and a bulky sterile dressing are applied. The limb is placed into a knee immobilizer and the patient is awakened and transported to recovery in stable condition.      Please note that a surgical assistant was a medical necessity for this procedure in order to perform it in a safe and expeditious manner. Surgical assistant was necessary to retract the ligaments and vital neurovascular structures to prevent injury to them and also necessary for proper positioning of the limb to allow for anatomic placement of the prosthesis.   Dione Plover Joclynn Lumb, MD    02/13/2016, 9:04 AM

## 2016-02-13 NOTE — Anesthesia Procedure Notes (Signed)
Spinal Patient location during procedure: OR Start time: 02/13/2016 7:59 AM End time: 02/13/2016 8:04 AM Staffing Resident/CRNA: Delano Scardino L Performed by: resident/CRNA  Preanesthetic Checklist Completed: patient identified, site marked, surgical consent, pre-op evaluation, timeout performed, IV checked, risks and benefits discussed and monitors and equipment checked Spinal Block Patient position: sitting Prep: Betadine Patient monitoring: heart rate, continuous pulse ox and blood pressure Approach: midline Location: L4-5 Injection technique: single-shot Needle Needle type: Sprotte  Needle gauge: 24 G Needle length: 10 cm Additional Notes Kit expiration 03/28/2017 and lot # [id number] CSF clear and free flowing, negative heme, negative paresthesia Returned to supine position, tolerated well

## 2016-02-13 NOTE — Interval H&P Note (Signed)
History and Physical Interval Note:  02/13/2016 6:55 AM  Miranda Wilson  has presented today for surgery, with the diagnosis of right knee osteoarthritis  The various methods of treatment have been discussed with the patient and family. After consideration of risks, benefits and other options for treatment, the patient has consented to  Procedure(s): RIGHT TOTAL KNEE ARTHROPLASTY (Right) as a surgical intervention .  The patient's history has been reviewed, patient examined, no change in status, stable for surgery.  I have reviewed the patient's chart and labs.  Questions were answered to the patient's satisfaction.     [name]

## 2016-02-14 LAB — CBC
HCT: 32.3 % — ABNORMAL LOW (ref 36.0–46.0)
Hemoglobin: 10.8 g/dL — ABNORMAL LOW (ref 12.0–15.0)
MCH: 27.6 pg (ref 26.0–34.0)
MCHC: 33.4 g/dL (ref 30.0–36.0)
MCV: 82.4 fL (ref 78.0–100.0)
PLATELETS: 166 10*3/uL (ref 150–400)
RBC: 3.92 MIL/uL (ref 3.87–5.11)
RDW: 13.7 % (ref 11.5–15.5)
WBC: 10.8 10*3/uL — ABNORMAL HIGH (ref 4.0–10.5)

## 2016-02-14 LAB — BASIC METABOLIC PANEL
Anion gap: 8 (ref 5–15)
BUN: 10 mg/dL (ref 6–20)
CO2: 25 mmol/L (ref 22–32)
CREATININE: 0.82 mg/dL (ref 0.44–1.00)
Calcium: 8.7 mg/dL — ABNORMAL LOW (ref 8.9–10.3)
Chloride: 110 mmol/L (ref 101–111)
GFR calc Af Amer: >60 mL/min (ref >60)
GLUCOSE: 137 mg/dL — AB (ref 65–99)
POTASSIUM: 4.6 mmol/L (ref 3.5–5.1)
SODIUM: 143 mmol/L (ref 135–145)

## 2016-02-14 MED ORDER — RIVAROXABAN 10 MG PO TABS: 10.0000 mg | ORAL_TABLET | Freq: Every day | ORAL | Status: DC

## 2016-02-14 MED ORDER — METHOCARBAMOL 500 MG PO TABS: 500.0000 mg | ORAL_TABLET | Freq: Four times a day (QID) | ORAL | Status: DC | PRN

## 2016-02-14 MED ORDER — HYDROMORPHONE HCL 2 MG PO TABS: 2.0000 mg | ORAL_TABLET | ORAL | Status: DC | PRN

## 2016-02-14 NOTE — Progress Notes (Signed)
Physical Therapy Treatment Patient Details Name: Miranda Wilson MRN: [id number] DOB: 25-Jul-1951 Today's Date: 02/14/2016    History of Present Illness s/p R TKA  PMHx: L TKA, manipulation    PT Comments    Progressing well, pt motivated to work with PT  Follow Up Recommendations  Home health PT     Equipment Recommendations  None recommended by PT    Recommendations for Other Services       Precautions / Restrictions Precautions Precautions: Knee Precaution Comments: KI not used, pt able to do I SLRs Required Braces or Orthoses: Knee Immobilizer - Left Knee Immobilizer - Left: Discontinue once straight leg raise with < 10 degree lag Restrictions Weight Bearing Restrictions: No Other Position/Activity Restrictions: WBAT    Mobility  Bed Mobility Overal bed mobility: Needs Assistance Bed Mobility: Sit to Supine       Sit to supine: Supervision   General bed mobility comments: incr time, supervision for safety  Transfers Overall transfer level: Needs assistance Equipment used: Rolling walker (2 wheeled) Transfers: Sit to/from Stand Sit to Stand: Min guard         General transfer comment: cues for hand placement, incr right  knee flexion  Ambulation/Gait Ambulation/Gait assistance: Min guard Ambulation Distance (Feet): 120 Feet Assistive device: Rolling walker (2 wheeled) Gait Pattern/deviations: Step-to pattern;Step-through pattern     General Gait Details: cues for posture, gait progression   Stairs            Wheelchair Mobility    Modified Rankin (Stroke Patients Only)       Balance                                    Cognition Arousal/Alertness: Awake/alert Behavior During Therapy: WFL for tasks assessed/performed Overall Cognitive Status: Within Functional Limits for tasks assessed                      Exercises Total Joint Exercises Ankle Circles/Pumps: AROM;15 reps;Both Quad Sets:  Strengthening;Both;10 reps Short Arc Quad: AROM;Strengthening;Right;10 reps Heel Slides: 10 reps;AAROM;Right Hip ABduction/ADduction: AROM;Strengthening;10 reps;Right Straight Leg Raises: AROM;Strengthening;Right;10 reps    General Comments        Pertinent Vitals/Pain Pain Assessment: 0-10 Pain Score: 4  Pain Location: R knee Pain Descriptors / Indicators: Sore Pain Intervention(s): Limited activity within patient's tolerance;Monitored during session;Premedicated before session;Repositioned;Ice applied    Home Living                      Prior Function            PT Goals (current goals can now be found in the care plan section) Acute Rehab PT Goals Patient Stated Goal: get new knee moving PT Goal Formulation: With patient Time For Goal Achievement: 02/17/16 Potential to Achieve Goals: Good Progress towards PT goals: Progressing toward goals    Frequency  7X/week    PT Plan Current plan remains appropriate    Co-evaluation             End of Session Equipment Utilized During Treatment: Gait belt Activity Tolerance: Patient tolerated treatment well Patient left: in bed;with call bell/phone within reach;with bed alarm set     Time: 1020-1051 PT Time Calculation (min) (ACUTE ONLY): 31 min  Charges:  $Gait Training: 8-22 mins $Therapeutic Exercise: 8-22 mins  G CodesKenyon Ana 02/14/2016, 11:01 AM

## 2016-02-14 NOTE — Progress Notes (Signed)
   02/14/16 1400  PT Visit Information  Last PT Received On 02/14/16  Assistance Needed +1  History of Present Illness s/p R TKA  PMHx: L TKA, manipulation  PT Time Calculation  PT Start Time (ACUTE ONLY) 1407  PT Stop Time (ACUTE ONLY) 1436  PT Time Calculation (min) (ACUTE ONLY) 29 min  Subjective Data  Patient Stated Goal go home tomorrow   Precautions  Precautions Knee  Precaution Comments KI not used, pt able to do I SLRs  Restrictions  RLE Weight Bearing WBAT  Pain Assessment  Pain Assessment 0-10  Pain Score 7  Pain Location R knee  Pain Descriptors / Indicators Aching;Burning  Pain Intervention(s) Limited activity within patient's tolerance;Monitored during session;Premedicated before session;Ice applied  Cognition  Arousal/Alertness Awake/alert  Behavior During Therapy WFL for tasks assessed/performed  Overall Cognitive Status Within Functional Limits for tasks assessed  Bed Mobility  Overal bed mobility Needs Assistance  Bed Mobility Sit to Supine;Supine to Sit  Supine to sit Supervision  Sit to supine Supervision  General bed mobility comments for safety  Transfers  Overall transfer level Needs assistance  Equipment used Rolling walker (2 wheeled)  Transfers Sit to/from Stand  Sit to Stand Supervision;Min guard  General transfer comment cues for hand placement and for management of RLE  Ambulation/Gait  Ambulation/Gait assistance Min guard;Supervision  Ambulation Distance (Feet) 150 Feet  Assistive device Rolling walker (2 wheeled)  Gait Pattern/deviations Step-to pattern;Step-through pattern  General Gait Details cues for posture, gait progression  Total Joint Exercises  Ankle Circles/Pumps AROM;15 reps;Both  Quad Sets Strengthening;Both;10 reps  Heel Slides 10 reps;AAROM;Right (with prolonged stretch)  PT - End of Session  Equipment Utilized During Treatment Gait belt  Activity Tolerance Patient tolerated treatment well  Patient left in bed;with call  bell/phone within reach;with bed alarm set  PT - Assessment/Plan  PT Plan Current plan remains appropriate  PT Frequency (ACUTE ONLY) 7X/week  Follow Up Recommendations Home health PT  PT equipment None recommended by PT  PT Goal Progression  Progress towards PT goals Progressing toward goals  Acute Rehab PT Goals  PT Goal Formulation With patient  Time For Goal Achievement 02/17/16  Potential to Achieve Goals Good  PT General Charges  $$ ACUTE PT VISIT 1 Procedure  PT Treatments  $Gait Training 8-22 mins  $Therapeutic Exercise 8-22 mins

## 2016-02-14 NOTE — Progress Notes (Signed)
   Subjective: 1 Day Post-Op Procedure(s) (LRB): RIGHT TOTAL KNEE ARTHROPLASTY (Right) Patient reports pain as moderate.   Walked with PT yesterday and did well Plan is to go Home after hospital stay.  Objective: Vital signs in last 24 hours: Temp:  [97.4 F (36.3 C)-98.1 F (36.7 C)] 97.9 F (36.6 C) (04/18 0508) Pulse Rate:  [45-60] 56 (04/18 0508) Resp:  [14-16] 16 (04/18 0508) BP: (110-132)/(57-82) 110/82 mmHg (04/18 0508) SpO2:  [99 %-100 %] 99 % (04/18 0508)  Intake/Output from previous day:  Intake/Output Summary (Last 24 hours) at 02/14/16 0714 Last data filed at 02/14/16 0654  Gross per 24 hour  Intake 5112.5 ml  Output   3179 ml  Net 1933.5 ml    Intake/Output this shift:    Labs:  Recent Labs  02/14/16 0406  HGB 10.8*    Recent Labs  02/14/16 0406  WBC 10.8*  RBC 3.92  HCT 32.3*  PLT 166    Recent Labs  02/14/16 0406  NA 143  K 4.6  CL 110  CO2 25  BUN 10  CREATININE 0.82  GLUCOSE 137*  CALCIUM 8.7*   No results for input(s): LABPT, INR in the last 72 hours.  EXAM General - Patient is Alert, Appropriate and Oriented Extremity - Neurologically intact Neurovascular intact No cellulitis present Compartment soft Dressing - dressing C/D/I Motor Function - intact, moving foot and toes well on exam.  Hemovac pulled without difficulty.  Past Medical History  Diagnosis Date  . Hyperlipidemia   . Hypertension   . IBS (irritable bowel syndrome)   . Varicose veins   . PMB (postmenopausal bleeding)   . History of idiopathic seizure     AGE 65 to 20  X5  --  UNKNOW IDIOLOGY , PER PT WAS ANEMIC AT THE TIME--  NONE SINCE  . S/P radioactive iodine thyroid ablation   . History of tachycardia     S/P  RADIOACTIVE IODINE ABLATION OF THYROID  . History of colon polyps   . Cervical spondylosis   . OA (osteoarthritis)     shoulders  left > right  . GERD (gastroesophageal reflux disease)   . Chronic constipation   . Urge urinary  incontinence   . Bilateral edema of lower extremity     left > right --  wear compression hose  . Hypothyroidism, postradioiodine therapy     AGE 82  . At risk for sleep apnea   . Vein disorder     LEFT ANKLE VEIN VALVE REFLUX WITH DECREASED CIRCULATION     Assessment/Plan: 1 Day Post-Op Procedure(s) (LRB): RIGHT TOTAL KNEE ARTHROPLASTY (Right) Principal Problem:   OA (osteoarthritis) of knee   Advance diet Up with therapy D/C IV fluids Plan for discharge tomorrow  We have discussed discharge destination and she will go home as opposed to rehab. Will get home health 2 x this week and 5 x next week then begin outpatient PT on 5/1. Requests [facility], specifically [facility], for her home PT  DVT Prophylaxis - Xarelto Weight-Bearing as tolerated to right leg D/C O2 and Pulse OX and try on Room Air  Song Myre V 02/14/2016, 7:14 AM

## 2016-02-14 NOTE — Evaluation (Addendum)
Occupational Therapy Evaluation AND Discharge  Patient Details Name: Miranda Wilson MRN: [id number] DOB: 1951/05/18 Today's Date: 02/14/2016    History of Present Illness s/p R TKA  PMHx: L TKA, manipulation   Clinical Impression   Patient admitted with above. Patient overall mod I PTA. Patient currently functioning at an overall supervision to min guard level.  No additional OT needs identified, D/C from acute OT services and no additional follow-up OT needs at this time. All appropriate education provided to patient. Please re-order OT if needed.  See ADL comment for more details of session.     Follow Up Recommendations  No OT follow up;Supervision - Intermittent    Equipment Recommendations  None recommended by OT    Recommendations for Other Services  None at this time   Precautions / Restrictions Precautions Precautions: Knee Precaution Comments: KI not used, pt able to do I SLRs Restrictions Weight Bearing Restrictions: Yes RLE Weight Bearing: Weight bearing as tolerated    Mobility Bed Mobility Overal bed mobility: Needs Assistance Bed Mobility: Sit to Supine;Supine to Sit     Supine to sit: Supervision Sit to supine: Supervision   General bed mobility comments: HOB slightly raised. Supervision for safety   Transfers Overall transfer level: Needs assistance Equipment used: Rolling walker (2 wheeled) Transfers: Sit to/from Stand Sit to Stand: Supervision;Min guard General transfer comment: cues for hand placement and for management of RLE    Balance Overall balance assessment: Needs assistance Sitting-balance support: No upper extremity supported;Feet supported Sitting balance-Leahy Scale: Good     Standing balance support: No upper extremity supported;During functional activity Standing balance-Leahy Scale: Good Standing balance comment: Pt standing at sink for grooming tasks with no UE support    ADL Overall ADL's : Needs  assistance/impaired Eating/Feeding: Set up;Sitting   Grooming: Supervision/safety;Standing   Upper Body Bathing: Set up;Sitting   Lower Body Bathing: Supervison/ safety;Sit to/from stand   Upper Body Dressing : Set up;Sitting   Lower Body Dressing: Supervision/safety;Sit to/from stand. Discussed compensatory strategies for safety and independence during LB ADLs.    Toilet Transfer: Supervision/safety;RW;BSC;Ambulation   Writer and Hygiene: Supervision/safety;Sitting/lateral 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Details (indicate cue type and reason): Discussed use of shower seat VS tub transfer bench in tub/shower combo. Pt wrote down instructions. Encouraged pt to perform a "dry run" with clothes donned before actually attempting a shower. Also encouraged pt to have husband present for showers initially.  Functional mobility during ADLs: Supervision/safety;Cueing for safety;Rolling walker      Vision Vision Assessment?: No apparent visual deficits          Pertinent Vitals/Pain Pain Assessment: 0-10 Pain Score: 6  Pain Location: R knee Pain Descriptors / Indicators: Sore;Aching Pain Intervention(s): Monitored during session;Repositioned;Ice applied     Hand Dominance Right   Extremity/Trunk Assessment Upper Extremity Assessment Upper Extremity Assessment: Overall WFL for tasks assessed   Lower Extremity Assessment Lower Extremity Assessment: Defer to PT evaluation   Cervical / Trunk Assessment Cervical / Trunk Assessment: Normal   Communication Communication Communication: No difficulties   Cognition Arousal/Alertness: Awake/alert Behavior During Therapy: WFL for tasks assessed/performed Overall Cognitive Status: Within Functional Limits for tasks assessed              Home Living Family/patient expects to be discharged to:: Private residence Living Arrangements: Spouse/significant other Available Help at Discharge: Family;Available 24  hours/day Type of Home: House Home Access: Ramped entrance     Home Layout: One level  Bathroom Shower/Tub: Corporate investment banker: Standard     Home Equipment: Environmental consultant - 2 wheels;Other (comment);Bedside commode;Cane - quad;Shower seat    Prior Functioning/Environment Level of Independence: Independent with assistive device(s)        Comments: amb with cane    OT Diagnosis: Generalized weakness;Acute pain   OT Problem List:   N/a, no acute OT needs identified at this time     OT Treatment/Interventions:   N/a, no acute OT needs identified at this time     OT Goals(Current goals can be found in the care plan section) Acute Rehab OT Goals Patient Stated Goal: go home tomorrow  OT Goal Formulation: All assessment and education complete, DC therapy  OT Frequency: Min 2X/week   Barriers to D/C:   None known at this time    End of Session Equipment Utilized During Treatment: Gait belt;Rolling walker CPM Right Knee CPM Right Knee: Off  Activity Tolerance: Patient tolerated treatment well Patient left: in bed;with call bell/phone within reach;with bed alarm set   Time: [id number] OT Time Calculation (min): 36 min Charges:  OT General Charges $OT Visit: 1 Procedure OT Evaluation $OT Eval Moderate Complexity: 1 Procedure OT Treatments $Self Care/Home Management : 8-22 mins  Chrys Racer , MS, OTR/L, CLT Pager: [phone]  02/14/2016, 11:36 AM

## 2016-02-14 NOTE — Discharge Instructions (Signed)
° °Dr. Frank Aluisio °Total Joint Specialist °[facility] °[address] °[place], [place] [address] °[phone] ° °TOTAL KNEE REPLACEMENT POSTOPERATIVE DIRECTIONS ° °Knee Rehabilitation, Guidelines Following Surgery  °Results after knee surgery are often greatly improved when you follow the exercise, range of motion and muscle strengthening exercises prescribed by your doctor. Safety measures are also important to protect the knee from further injury. Any time any of these exercises cause you to have increased pain or swelling in your knee joint, decrease the amount until you are comfortable again and slowly increase them. If you have problems or questions, call your caregiver or physical therapist for advice.  ° °HOME CARE INSTRUCTIONS  °Remove items at home which could result in a fall. This includes throw rugs or furniture in walking pathways.  °· ICE to the affected knee every three hours for 30 minutes at a time and then as needed for pain and swelling.  Continue to use ice on the knee for pain and swelling from surgery. You may notice swelling that will progress down to the foot and ankle.  This is normal after surgery.  Elevate the leg when you are not up walking on it.   °· Continue to use the breathing machine which will help keep your temperature down.  It is common for your temperature to cycle up and down following surgery, especially at night when you are not up moving around and exerting yourself.  The breathing machine keeps your lungs expanded and your temperature down. °· Do not place pillow under knee, focus on keeping the knee straight while resting ° °DIET °You may resume your previous home diet once your are discharged from the hospital. ° °DRESSING / WOUND CARE / SHOWERING °You may start showering once you are discharged home but do not submerge the incision under water. Just pat the incision dry and apply a dry gauze dressing on daily. °Change the surgical dressing  daily and reapply a dry dressing each time. ° °ACTIVITY °Walk with your walker as instructed. °Use walker as long as suggested by your caregivers. °Avoid periods of inactivity such as sitting longer than an hour when not asleep. This helps prevent blood clots.  °You may resume a sexual relationship in one month or when given the OK by your doctor.  °You may return to work once you are cleared by your doctor.  °Do not drive a car for 6 weeks or until released by you surgeon.  °Do not drive while taking narcotics. ° °WEIGHT BEARING °Weight bearing as tolerated with assist device (walker, cane, etc) as directed, use it as long as suggested by your surgeon or therapist, typically at least 4-6 weeks. ° °POSTOPERATIVE CONSTIPATION PROTOCOL °Constipation - defined medically as fewer than three stools per week and severe constipation as less than one stool per week. ° °One of the most common issues patients have following surgery is constipation.  Even if you have a regular bowel pattern at home, your normal regimen is likely to be disrupted due to multiple reasons following surgery.  Combination of anesthesia, postoperative narcotics, change in appetite and fluid intake all can affect your bowels.  In order to avoid complications following surgery, here are some recommendations in order to help you during your recovery period. ° °Colace (docusate) - Pick up an over-the-counter form of Colace or another stool softener and take twice a day as long as you are requiring postoperative pain medications.  Take with a full glass of water   daily.  If you experience loose stools or diarrhea, hold the colace until you stool forms back up.  If your symptoms do not get better within 1 week or if they get worse, check with your doctor. ° °Dulcolax (bisacodyl) - Pick up over-the-counter and take as directed by the product packaging as needed to assist with the movement of your bowels.  Take with a full glass of water.  Use this product as  needed if not relieved by Colace only.  ° °MiraLax (polyethylene glycol) - Pick up over-the-counter to have on hand.  MiraLax is a solution that will increase the amount of water in your bowels to assist with bowel movements.  Take as directed and can mix with a glass of water, juice, soda, coffee, or tea.  Take if you go more than two days without a movement. °Do not use MiraLax more than once per day. Call your doctor if you are still constipated or irregular after using this medication for 7 days in a row. ° °If you continue to have problems with postoperative constipation, please contact the office for further assistance and recommendations.  If you experience "the worst abdominal pain ever" or develop nausea or vomiting, please contact the office immediatly for further recommendations for treatment. ° °ITCHING ° If you experience itching with your medications, try taking only a single pain pill, or even half a pain pill at a time.  You can also use Benadryl over the counter for itching or also to help with sleep.  ° °TED HOSE STOCKINGS °Wear the elastic stockings on both legs for three weeks following surgery during the day but you may remove then at night for sleeping. ° °MEDICATIONS °See your medication summary on the “After Visit Summary” that the nursing staff will review with you prior to discharge.  You may have some home medications which will be placed on hold until you complete the course of blood thinner medication.  It is important for you to complete the blood thinner medication as prescribed by your surgeon.  Continue your approved medications as instructed at time of discharge. ° °PRECAUTIONS °If you experience chest pain or shortness of breath - call 911 immediately for transfer to the hospital emergency department.  °If you develop a fever greater that 101 F, purulent drainage from wound, increased redness or drainage from wound, foul odor from the wound/dressing, or calf pain - CONTACT YOUR  SURGEON.   °                                                °FOLLOW-UP APPOINTMENTS °Make sure you keep all of your appointments after your operation with your surgeon and caregivers. You should call the office at the above phone number and make an appointment for approximately two weeks after the date of your surgery or on the date instructed by your surgeon outlined in the "After Visit Summary". ° ° °RANGE OF MOTION AND STRENGTHENING EXERCISES  °Rehabilitation of the knee is important following a knee injury or an operation. After just a few days of immobilization, the muscles of the thigh which control the knee become weakened and shrink (atrophy). Knee exercises are designed to build up the tone and strength of the thigh muscles and to improve knee motion. Often times heat used for twenty to thirty minutes before working out will loosen   up your tissues and help with improving the range of motion but do not use heat for the first two weeks following surgery. These exercises can be done on a training (exercise) mat, on the floor, on a table or on a bed. Use what ever works the best and is most comfortable for you Knee exercises include:  °Leg Lifts - While your knee is still immobilized in a splint or cast, you can do straight leg raises. Lift the leg to 60 degrees, hold for 3 sec, and slowly lower the leg. Repeat 10-20 times 2-3 times daily. Perform this exercise against resistance later as your knee gets better.  °Quad and Hamstring Sets - Tighten up the muscle on the front of the thigh (Quad) and hold for 5-10 sec. Repeat this 10-20 times hourly. Hamstring sets are done by pushing the foot backward against an object and holding for 5-10 sec. Repeat as with quad sets.  °· Leg Slides: Lying on your back, slowly slide your foot toward your buttocks, bending your knee up off the floor (only go as far as is comfortable). Then slowly slide your foot back down until your leg is flat on the floor again. °· Angel Wings:  Lying on your back spread your legs to the side as far apart as you can without causing discomfort.  °A rehabilitation program following serious knee injuries can speed recovery and prevent re-injury in the future due to weakened muscles. Contact your doctor or a physical therapist for more information on knee rehabilitation.  ° °IF YOU ARE TRANSFERRED TO A SKILLED REHAB FACILITY °If the patient is transferred to a skilled rehab facility following release from the hospital, a list of the current medications will be sent to the facility for the patient to continue.  When discharged from the skilled rehab facility, please have the facility set up the patient's Home Health Physical Therapy prior to being released. Also, the skilled facility will be responsible for providing the patient with their medications at time of release from the facility to include their pain medication, the muscle relaxants, and their blood thinner medication. If the patient is still at the rehab facility at time of the two week follow up appointment, the skilled rehab facility will also need to assist the patient in arranging follow up appointment in our office and any transportation needs. ° °MAKE SURE YOU:  °Understand these instructions.  °Get help right away if you are not doing well or get worse.  ° ° °Pick up stool softner and laxative for home use following surgery while on pain medications. °Do not submerge incision under water. °Please use good hand washing techniques while changing dressing each day. °May shower starting three days after surgery. °Please use a clean towel to pat the incision dry following showers. °Continue to use ice for pain and swelling after surgery. °Do not use any lotions or creams on the incision until instructed by your surgeon. °Information on my medicine - XARELTO® (Rivaroxaban) ° °This medication education was reviewed with me or my healthcare representative as part of my discharge preparation.  The  pharmacist that spoke with me during my hospital stay was:  Shavell Nored E, RPH ° °Why was Xarelto® prescribed for you? °Xarelto® was prescribed for you to reduce the risk of blood clots forming after orthopedic surgery. The medical term for these abnormal blood clots is venous thromboembolism (VTE). ° °What do you need to know about xarelto® ? °Take your Xarelto® ONCE DAILY at the   same time every day. °You may take it either with or without food. ° °If you have difficulty swallowing the tablet whole, you may crush it and mix in applesauce just prior to taking your dose. ° °Take Xarelto® exactly as prescribed by your doctor and DO NOT stop taking Xarelto® without talking to the doctor who prescribed the medication.  Stopping without other VTE prevention medication to take the place of Xarelto® may increase your risk of developing a clot. ° °After discharge, you should have regular check-up appointments with your healthcare provider that is prescribing your Xarelto®.   ° °What do you do if you miss a dose? °If you miss a dose, take it as soon as you remember on the same day then continue your regularly scheduled once daily regimen the next day. Do not take two doses of Xarelto® on the same day.  ° °Important Safety Information °A possible side effect of Xarelto® is bleeding. You should call your healthcare provider right away if you experience any of the following: °? Bleeding from an injury or your nose that does not stop. °? Unusual colored urine (red or dark brown) or unusual colored stools (red or black). °? Unusual bruising for unknown reasons. °? A serious fall or if you hit your head (even if there is no bleeding). ° °Some medicines may interact with Xarelto® and might increase your risk of bleeding while on Xarelto®. To help avoid this, consult your healthcare provider or pharmacist prior to using any new prescription or non-prescription medications, including herbals, vitamins, non-steroidal  anti-inflammatory drugs (NSAIDs) and supplements. ° °This website has more information on Xarelto®: www.xarelto.com. ° ° °

## 2016-02-14 NOTE — Care Management Note (Signed)
Case Management Note  Patient Details  Name: Miranda Wilson MRN: [id number] Date of Birth: August 29, 1951  Subjective/Objective:      S/p Right Total Knee Arthroplasty               Action/Plan: Discharge planning, spoke with patient at beside. Chose [facility] for [facility] services, contacted [facility] for referral, requesting Debbie Dabbs. Has RW and 3-n-1.   Expected Discharge Date:                  Expected Discharge Plan:  [place]  In-House Referral:  NA  Discharge planning Services  CM Consult  Post Acute Care Choice:  Home Health Choice offered to:  Patient  DME Arranged:  N/A DME Agency:  NA  HH Arranged:  PT [place] Agency:  [facility]  Status of Service:  Completed, signed off  Medicare Important Message Given:    Date Medicare IM Given:    Medicare IM give by:    Date Additional Medicare IM Given:    Additional Medicare Important Message give by:     If discussed at [place] of Stay Meetings, dates discussed:    Additional Comments:  Guadalupe Maple, RN 02/14/2016, 11:13 AM [phone]

## 2016-02-15 LAB — BASIC METABOLIC PANEL
Anion gap: 5 (ref 5–15)
BUN: 12 mg/dL (ref 6–20)
CO2: 26 mmol/L (ref 22–32)
Calcium: 8.9 mg/dL (ref 8.9–10.3)
Chloride: 109 mmol/L (ref 101–111)
Creatinine, Ser: 0.78 mg/dL (ref 0.44–1.00)
GFR calc Af Amer: >60 mL/min (ref >60)
GLUCOSE: 128 mg/dL — AB (ref 65–99)
POTASSIUM: 4.2 mmol/L (ref 3.5–5.1)
Sodium: 140 mmol/L (ref 135–145)

## 2016-02-15 LAB — CBC
HCT: 31.3 % — ABNORMAL LOW (ref 36.0–46.0)
Hemoglobin: 10.5 g/dL — ABNORMAL LOW (ref 12.0–15.0)
MCH: 28.4 pg (ref 26.0–34.0)
MCHC: 33.5 g/dL (ref 30.0–36.0)
MCV: 84.6 fL (ref 78.0–100.0)
Platelets: 168 K/uL (ref 150–400)
RBC: 3.7 MIL/uL — ABNORMAL LOW (ref 3.87–5.11)
RDW: 14.3 % (ref 11.5–15.5)
WBC: 9.3 K/uL (ref 4.0–10.5)

## 2016-02-15 NOTE — Progress Notes (Signed)
   Subjective: 2 Days Post-Op Procedure(s) (LRB): RIGHT TOTAL KNEE ARTHROPLASTY (Right) Patient reports pain as mild.   Patient seen in rounds for Dr. Wynelle Link. Patient is well, and has had no acute complaints or problems. Reports that she is pleased with her progress already. No SOB or chest pain. Voiding well. Positive flatus.  Plan is to go Home after hospital stay.  Objective: Vital signs in last 24 hours: Temp:  [97.7 F (36.5 C)-98.2 F (36.8 C)] 98.2 F (36.8 C) (04/19 0518) Pulse Rate:  [58-67] 67 (04/19 0518) Resp:  [16-18] 18 (04/19 0518) BP: (119-152)/(57-68) 152/68 mmHg (04/19 0518) SpO2:  [98 %-99 %] 99 % (04/19 0518)  Intake/Output from previous day:  Intake/Output Summary (Last 24 hours) at 02/15/16 0751 Last data filed at 02/14/16 2303  Gross per 24 hour  Intake    840 ml  Output   1100 ml  Net   -260 ml     Labs:  Recent Labs  02/14/16 0406 02/15/16 0404  HGB 10.8* 10.5*    Recent Labs  02/14/16 0406 02/15/16 0404  WBC 10.8* 9.3  RBC 3.92 3.70*  HCT 32.3* 31.3*  PLT 166 168    Recent Labs  02/14/16 0406 02/15/16 0404  NA 143 140  K 4.6 4.2  CL 110 109  CO2 25 26  BUN 10 12  CREATININE 0.82 0.78  GLUCOSE 137* 128*  CALCIUM 8.7* 8.9    EXAM General - Patient is Alert and Oriented Extremity - Neurologically intact Intact pulses distally Dorsiflexion/Plantar flexion intact No cellulitis present Compartment soft Dressing/Incision - clean, dry, no drainage Motor Function - intact, moving foot and toes well on exam.   Past Medical History  Diagnosis Date  . Hyperlipidemia   . Hypertension   . IBS (irritable bowel syndrome)   . Varicose veins   . PMB (postmenopausal bleeding)   . History of idiopathic seizure     AGE 65 to 20  X5  --  UNKNOW IDIOLOGY , PER PT WAS ANEMIC AT THE TIME--  NONE SINCE  . S/P radioactive iodine thyroid ablation   . History of tachycardia     S/P  RADIOACTIVE IODINE ABLATION OF THYROID  . History of  colon polyps   . Cervical spondylosis   . OA (osteoarthritis)     shoulders  left > right  . GERD (gastroesophageal reflux disease)   . Chronic constipation   . Urge urinary incontinence   . Bilateral edema of lower extremity     left > right --  wear compression hose  . Hypothyroidism, postradioiodine therapy     AGE 65  . At risk for sleep apnea   . Vein disorder     LEFT ANKLE VEIN VALVE REFLUX WITH DECREASED CIRCULATION     Assessment/Plan: 2 Days Post-Op Procedure(s) (LRB): RIGHT TOTAL KNEE ARTHROPLASTY (Right) Principal Problem:   OA (osteoarthritis) of knee  Estimated body mass index is 37.13 kg/(m^2) as calculated from the following:   Height as of this encounter: 5\' 3"  (1.6 m).   Weight as of this encounter: 95.057 kg (209 lb 9 oz). Advance diet Up with therapy D/C IV fluids Discharge home with home health  DVT Prophylaxis - Xarelto Weight-Bearing as tolerated   Ardeen Jourdain, PA-C Orthopaedic Surgery 02/15/2016, 7:51 AM

## 2016-02-15 NOTE — Progress Notes (Signed)
   02/15/16 1300  PT Visit Information  Last PT Received On 02/15/16  Assistance Needed +1  History of Present Illness s/p R TKA  PMHx: L TKA, manipulation  PT Time Calculation  PT Start Time (ACUTE ONLY) 1341  PT Stop Time (ACUTE ONLY) 1406  PT Time Calculation (min) (ACUTE ONLY) 25 min  Precautions  Precautions Knee  Precaution Comments KI not used, pt able to do I SLRs  Restrictions  Other Position/Activity Restrictions WBAT  Pain Assessment  Pain Assessment 0-10  Pain Score 6  Pain Location R knee with activity  Pain Descriptors / Indicators Aching  Pain Intervention(s) Limited activity within patient's tolerance;Monitored during session  Cognition  Arousal/Alertness Awake/alert  Behavior During Therapy WFL for tasks assessed/performed  Overall Cognitive Status Within Functional Limits for tasks assessed  Bed Mobility  Overal bed mobility Needs Assistance  Bed Mobility Sit to Supine  Sit to supine Supervision  Transfers  Equipment used Rolling walker (2 wheeled)  Transfers Sit to/from Stand  Sit to Stand Supervision  General transfer comment cues for hand placement and for management of RLE, pt self corrects  Ambulation/Gait  Ambulation/Gait assistance Supervision;Modified independent (Device/Increase time)  Ambulation Distance (Feet) 200 Feet  Assistive device Rolling walker (2 wheeled)  Gait Pattern/deviations Step-to pattern;Step-through pattern  General Gait Details cues for posture, gait progression  Total Joint Exercises  Ankle Circles/Pumps AROM;15 reps;Both  Quad Sets Strengthening;Both;10 reps  Heel Slides 10 reps;AAROM;Right  Straight Leg Raises AROM;Strengthening;Right;15 reps  Short Arc Quad AROM;Strengthening;Right;15 reps  PT - End of Session  Activity Tolerance Patient tolerated treatment well  Patient left in bed;with call bell/phone within reach;with family/visitor present  PT - Assessment/Plan  PT Plan Current plan remains appropriate  PT  Frequency (ACUTE ONLY) 7X/week  Follow Up Recommendations Home health PT  PT equipment None recommended by PT  PT Goal Progression  Progress towards PT goals Progressing toward goals  Acute Rehab PT Goals  PT Goal Formulation With patient  Time For Goal Achievement 02/17/16  Potential to Achieve Goals Good  PT Treatments  $Gait Training 8-22 mins  $Therapeutic Exercise 8-22 mins

## 2016-02-15 NOTE — Progress Notes (Signed)
Physical Therapy Treatment Patient Details Name: Miranda Wilson MRN: [id number] DOB: 01-27-1951 Today's Date: 02/15/2016    History of Present Illness s/p R TKA  PMHx: L TKA, manipulation    PT Comments    Excellent progress  Follow Up Recommendations  Home health PT     Equipment Recommendations  None recommended by PT    Recommendations for Other Services       Precautions / Restrictions Precautions Precautions: Knee Precaution Comments: KI not used, pt able to do I SLRs Required Braces or Orthoses: Knee Immobilizer - Left Knee Immobilizer - Left: Discontinue once straight leg raise with < 10 degree lag Restrictions Weight Bearing Restrictions: No RLE Weight Bearing: Weight bearing as tolerated Other Position/Activity Restrictions: WBAT    Mobility  Bed Mobility Overal bed mobility: Needs Assistance             General bed mobility comments: NT--pt in chair  Transfers Overall transfer level: Needs assistance Equipment used: Rolling walker (2 wheeled) Transfers: Sit to/from Stand Sit to Stand: Supervision         General transfer comment: cues for hand placement and for management of RLE, pt self corrects  Ambulation/Gait Ambulation/Gait assistance: Supervision Ambulation Distance (Feet): 170 Feet Assistive device: Rolling walker (2 wheeled) Gait Pattern/deviations: Step-through pattern     General Gait Details: cues for posture, gait progression   Stairs            Wheelchair Mobility    Modified Rankin (Stroke Patients Only)       Balance                                    Cognition Arousal/Alertness: Awake/alert Behavior During Therapy: WFL for tasks assessed/performed Overall Cognitive Status: Within Functional Limits for tasks assessed                      Exercises Total Joint Exercises Ankle Circles/Pumps: AROM;15 reps;Both Quad Sets: Strengthening;Both;10 reps Short Arc Quad:  AROM;Strengthening;Right;15 reps Heel Slides: 10 reps;AAROM;Right Hip ABduction/ADduction: AROM;Strengthening;Right;15 reps Straight Leg Raises: AROM;Strengthening;Right;15 reps Goniometric ROM: 95* right knee flexion    General Comments        Pertinent Vitals/Pain Pain Assessment: 0-10 Pain Score: 4  Pain Location: R knee Pain Descriptors / Indicators: Aching Pain Intervention(s): Limited activity within patient's tolerance;Monitored during session;Premedicated before session;Ice applied    Home Living                      Prior Function            PT Goals (current goals can now be found in the care plan section) Acute Rehab PT Goals PT Goal Formulation: With patient Time For Goal Achievement: 02/17/16 Potential to Achieve Goals: Good Progress towards PT goals: Progressing toward goals    Frequency  7X/week    PT Plan Current plan remains appropriate    Co-evaluation             End of Session Equipment Utilized During Treatment: Gait belt Activity Tolerance: Patient tolerated treatment well Patient left: with call bell/phone within reach;with bed alarm set;in chair     Time: 0920-0949 PT Time Calculation (min) (ACUTE ONLY): 29 min  Charges:  $Gait Training: 8-22 mins $Therapeutic Exercise: 8-22 mins                    G  Codes:      [facility] 02/15/2016, 10:48 AM

## 2016-02-15 NOTE — Discharge Summary (Signed)
Physician Discharge Summary   Patient ID: Miranda Wilson MRN: [id number] DOB/AGE: [birth year] 65 y.o.  Admit date: 02/13/2016 Discharge date: 02/15/2016  Primary Diagnosis: Primary osteoarthritis right knee  Admission Diagnoses:  Past Medical History  Diagnosis Date  . Hyperlipidemia   . Hypertension   . IBS (irritable bowel syndrome)   . Varicose veins   . PMB (postmenopausal bleeding)   . History of idiopathic seizure     AGE 77 to 20  X5  --  UNKNOW IDIOLOGY , PER PT WAS ANEMIC AT THE TIME--  NONE SINCE  . S/P radioactive iodine thyroid ablation   . History of tachycardia     S/P  RADIOACTIVE IODINE ABLATION OF THYROID  . History of colon polyps   . Cervical spondylosis   . OA (osteoarthritis)     shoulders  left > right  . GERD (gastroesophageal reflux disease)   . Chronic constipation   . Urge urinary incontinence   . Bilateral edema of lower extremity     left > right --  wear compression hose  . Hypothyroidism, postradioiodine therapy     AGE 18  . At risk for sleep apnea   . Vein disorder     LEFT ANKLE VEIN VALVE REFLUX WITH DECREASED CIRCULATION    Discharge Diagnoses:   Principal Problem:   OA (osteoarthritis) of knee  Estimated body mass index is 37.13 kg/(m^2) as calculated from the following:   Height as of this encounter: _0  (1.6 m).   Weight as of this encounter: 95.057 kg (209 lb 9 oz).  Procedure:  Procedure(s) (LRB): RIGHT TOTAL KNEE ARTHROPLASTY (Right)   Consults: None  HPI: Miranda Wilson, 65 y.o. female, has a history of pain and functional disability in the right knee due to arthritis and has failed non-surgical conservative treatments for greater than 12 weeks to includeNSAID's and/or analgesics, corticosteriod injections and activity modification. Onset of symptoms was gradual, starting 5 years ago with gradually worsening course since that time. The patient noted no past surgery on the right knee(s). Patient currently rates pain in  the right knee(s) at 7 out of 10 with activity. Patient has night pain, worsening of pain with activity and weight bearing, pain that interferes with activities of daily living, pain with passive range of motion, crepitus and joint swelling. Patient has evidence of periarticular osteophytes and joint space narrowing by imaging studies. There is no active infection.  Laboratory Data: Admission on 02/13/2016, Discharged on 02/15/2016  Component Date Value Ref Range Status  . WBC 02/14/2016 10.8* 4.0 - 10.5 K/uL Final  . RBC 02/14/2016 3.92  3.87 - 5.11 MIL/uL Final  . Hemoglobin 02/14/2016 10.8* 12.0 - 15.0 g/dL Final  . HCT 02/14/2016 32.3* 36.0 - 46.0 % Final  . MCV 02/14/2016 82.4  78.0 - 100.0 fL Final  . MCH 02/14/2016 27.6  26.0 - 34.0 pg Final  . MCHC 02/14/2016 33.4  30.0 - 36.0 g/dL Final  . RDW 02/14/2016 13.7  11.5 - 15.5 % Final  . Platelets 02/14/2016 166  150 - 400 K/uL Final  . Sodium 02/14/2016 143  135 - 145 mmol/L Final  . Potassium 02/14/2016 4.6  3.5 - 5.1 mmol/L Final  . Chloride 02/14/2016 110  101 - 111 mmol/L Final  . CO2 02/14/2016 25  22 - 32 mmol/L Final  . Glucose, Bld 02/14/2016 137* 65 - 99 mg/dL Final  . BUN 02/14/2016 10  6 - 20 mg/dL Final  . Creatinine, Ser  02/14/2016 0.82  0.44 - 1.00 mg/dL Final  . Calcium 02/14/2016 8.7* 8.9 - 10.3 mg/dL Final  . GFR calc non Af Amer 02/14/2016 >60  >60 mL/min Final  . GFR calc Af Amer 02/14/2016 >60  >60 mL/min Final   Comment: (NOTE) The eGFR has been calculated using the CKD EPI equation. This calculation has not been validated in all clinical situations. eGFR's persistently <60 mL/min signify possible Chronic Kidney Disease.   . Anion gap 02/14/2016 8  5 - 15 Final  . WBC 02/15/2016 9.3  4.0 - 10.5 K/uL Final  . RBC 02/15/2016 3.70* 3.87 - 5.11 MIL/uL Final  . Hemoglobin 02/15/2016 10.5* 12.0 - 15.0 g/dL Final  . HCT 02/15/2016 31.3* 36.0 - 46.0 % Final  . MCV 02/15/2016 84.6  78.0 - 100.0 fL Final  . MCH  02/15/2016 28.4  26.0 - 34.0 pg Final  . MCHC 02/15/2016 33.5  30.0 - 36.0 g/dL Final  . RDW 02/15/2016 14.3  11.5 - 15.5 % Final  . Platelets 02/15/2016 168  150 - 400 K/uL Final  . Sodium 02/15/2016 140  135 - 145 mmol/L Final  . Potassium 02/15/2016 4.2  3.5 - 5.1 mmol/L Final  . Chloride 02/15/2016 109  101 - 111 mmol/L Final  . CO2 02/15/2016 26  22 - 32 mmol/L Final  . Glucose, Bld 02/15/2016 128* 65 - 99 mg/dL Final  . BUN 02/15/2016 12  6 - 20 mg/dL Final  . Creatinine, Ser 02/15/2016 0.78  0.44 - 1.00 mg/dL Final  . Calcium 02/15/2016 8.9  8.9 - 10.3 mg/dL Final  . GFR calc non Af Amer 02/15/2016 >60  >60 mL/min Final  . GFR calc Af Amer 02/15/2016 >60  >60 mL/min Final   Comment: (NOTE) The eGFR has been calculated using the CKD EPI equation. This calculation has not been validated in all clinical situations. eGFR's persistently <60 mL/min signify possible Chronic Kidney Disease.   Georgiann Hahn gap 02/15/2016 5  5 - 15 Final  Hospital Outpatient Visit on 02/06/2016  Component Date Value Ref Range Status  . MRSA, PCR 02/06/2016 NEGATIVE  NEGATIVE Final  . Staphylococcus aureus 02/06/2016 NEGATIVE  NEGATIVE Final   Comment:        The Xpert SA Assay (FDA approved for NASAL specimens in patients over 60 years of age), is one component of a comprehensive surveillance program.  Test performance has been validated by [facility] for patients greater than or equal to 64 year old. It is not intended to diagnose infection nor to guide or monitor treatment.   Marland Kitchen aPTT 02/06/2016 29  24 - 37 seconds Final  . WBC 02/06/2016 4.3  4.0 - 10.5 K/uL Final  . RBC 02/06/2016 4.75  3.87 - 5.11 MIL/uL Final  . Hemoglobin 02/06/2016 13.4  12.0 - 15.0 g/dL Final  . HCT 02/06/2016 40.6  36.0 - 46.0 % Final  . MCV 02/06/2016 85.5  78.0 - 100.0 fL Final  . MCH 02/06/2016 28.2  26.0 - 34.0 pg Final  . MCHC 02/06/2016 33.0  30.0 - 36.0 g/dL Final  . RDW 02/06/2016 13.6  11.5 - 15.5 % Final  .  Platelets 02/06/2016 190  150 - 400 K/uL Final  . Sodium 02/06/2016 140  135 - 145 mmol/L Final  . Potassium 02/06/2016 5.2* 3.5 - 5.1 mmol/L Final  . Chloride 02/06/2016 107  101 - 111 mmol/L Final  . CO2 02/06/2016 27  22 - 32 mmol/L Final  . Glucose, Bld  02/06/2016 99  65 - 99 mg/dL Final  . BUN 02/06/2016 16  6 - 20 mg/dL Final  . Creatinine, Ser 02/06/2016 0.94  0.44 - 1.00 mg/dL Final  . Calcium 02/06/2016 9.5  8.9 - 10.3 mg/dL Final  . Total Protein 02/06/2016 7.0  6.5 - 8.1 g/dL Final  . Albumin 02/06/2016 4.3  3.5 - 5.0 g/dL Final  . AST 02/06/2016 26  15 - 41 U/L Final  . ALT 02/06/2016 17  14 - 54 U/L Final  . Alkaline Phosphatase 02/06/2016 84  38 - 126 U/L Final  . Total Bilirubin 02/06/2016 0.5  0.3 - 1.2 mg/dL Final  . GFR calc non Af Amer 02/06/2016 >60  >60 mL/min Final  . GFR calc Af Amer 02/06/2016 >60  >60 mL/min Final   Comment: (NOTE) The eGFR has been calculated using the CKD EPI equation. This calculation has not been validated in all clinical situations. eGFR's persistently <60 mL/min signify possible Chronic Kidney Disease.   . Anion gap 02/06/2016 6  5 - 15 Final  . Prothrombin Time 02/06/2016 14.5  11.6 - 15.2 seconds Final  . INR 02/06/2016 1.15  0.00 - 1.49 Final  . ABO/RH(D) 02/06/2016 O NEG   Final  . Antibody Screen 02/06/2016 NEG   Final  . Sample Expiration 02/06/2016 02/16/2016   Final  . Extend sample reason 02/06/2016 NO TRANSFUSIONS OR PREGNANCY IN THE PAST 3 MONTHS   Final  . Color, Urine 02/06/2016 YELLOW  YELLOW Final  . APPearance 02/06/2016 CLEAR  CLEAR Final  . Specific Gravity, Urine 02/06/2016 1.006  1.005 - 1.030 Final  . pH 02/06/2016 7.0  5.0 - 8.0 Final  . Glucose, UA 02/06/2016 NEGATIVE  NEGATIVE mg/dL Final  . Hgb urine dipstick 02/06/2016 NEGATIVE  NEGATIVE Final  . Bilirubin Urine 02/06/2016 NEGATIVE  NEGATIVE Final  . Ketones, ur 02/06/2016 NEGATIVE  NEGATIVE mg/dL Final  . Protein, ur 02/06/2016 NEGATIVE  NEGATIVE mg/dL  Final  . Nitrite 02/06/2016 NEGATIVE  NEGATIVE Final  . Leukocytes, UA 02/06/2016 NEGATIVE  NEGATIVE Final   MICROSCOPIC NOT DONE ON URINES WITH NEGATIVE PROTEIN, BLOOD, LEUKOCYTES, NITRITE, OR GLUCOSE <1000 mg/dL.     Hospital Course: Miranda Wilson is a 65 y.o. who was admitted to [facility]. They were brought to the operating room on 02/13/2016 and underwent Procedure(s): RIGHT TOTAL KNEE ARTHROPLASTY.  Patient tolerated the procedure well and was later transferred to the recovery room and then to the orthopaedic floor for postoperative care.  They were given PO and IV analgesics for pain control following their surgery.  They were given 24 hours of postoperative antibiotics of  Anti-infectives    Start     Dose/Rate Route Frequency Ordered Stop   02/13/16 1400  ceFAZolin (ANCEF) IVPB 2g/100 mL premix     2 g 200 mL/hr over 30 Minutes Intravenous Every 6 hours 02/13/16 1137 02/13/16 2019   02/13/16 0728  ceFAZolin (ANCEF) IVPB 2g/100 mL premix     2 g 200 mL/hr over 30 Minutes Intravenous On call to O.R. 02/13/16 0728 02/13/16 0800     and started on DVT prophylaxis in the form of Xarelto.   PT and OT were ordered for total joint protocol.  Discharge planning consulted to help with postop disposition and equipment needs.  Patient had a good night on the evening of surgery.  They started to get up OOB with therapy on day one. Hemovac drain was pulled without difficulty.  Continued to work  with therapy into day two.  Dressing was changed on day two and the incision was clean and dry.  The patient had progressed with therapy and meeting their goals.  Incision was healing well.  Patient was seen in rounds and was ready to go home. She reported feeling much better than she did with her left total knee arthroplasty.   Diet: Cardiac diet Activity:WBAT Follow-up:in 2 weeks Disposition - Home Discharged Condition: stable   Discharge Instructions    Call MD / Call 911    Complete by:   As directed   If you experience chest pain or shortness of breath, CALL 911 and be transported to the hospital emergency room.  If you develope a fever above 101 F, pus (white drainage) or increased drainage or redness at the wound, or calf pain, call your surgeon's office.     Constipation Prevention    Complete by:  As directed   Drink plenty of fluids.  Prune juice may be helpful.  You may use a stool softener, such as Colace (over the counter) 100 mg twice a day.  Use MiraLax (over the counter) for constipation as needed.     Diet - low sodium heart healthy    Complete by:  As directed      Discharge instructions    Complete by:  As directed   Dr. Gaynelle Arabian Total Joint Specialist [facility] [address], [place], [place] [address] [phone]  TOTAL KNEE REPLACEMENT POSTOPERATIVE DIRECTIONS  Knee Rehabilitation, Guidelines Following Surgery  Results after knee surgery are often greatly improved when you follow the exercise, range of motion and muscle strengthening exercises prescribed by your doctor. Safety measures are also important to protect the knee from further injury. Any time any of these exercises cause you to have increased pain or swelling in your knee joint, decrease the amount until you are comfortable again and slowly increase them. If you have problems or questions, call your caregiver or physical therapist for advice.   HOME CARE INSTRUCTIONS  Remove items at home which could result in a fall. This includes throw rugs or furniture in walking pathways.  ICE to the affected knee every three hours for 30 minutes at a time and then as needed for pain and swelling.  Continue to use ice on the knee for pain and swelling from surgery. You may notice swelling that will progress down to the foot and ankle.  This is normal after surgery.  Elevate the leg when you are not up walking on it.   Continue to use the breathing machine which will help keep your  temperature down.  It is common for your temperature to cycle up and down following surgery, especially at night when you are not up moving around and exerting yourself.  The breathing machine keeps your lungs expanded and your temperature down. Do not place pillow under knee, focus on keeping the knee straight while resting  DIET You may resume your previous home diet once your are discharged from the hospital.  DRESSING / WOUND CARE / SHOWERING You may start showering once you are discharged home but do not submerge the incision under water. Just pat the incision dry and apply a dry gauze dressing on daily. Change the surgical dressing daily and reapply a dry dressing each time.  ACTIVITY Walk with your walker as instructed. Use walker as long as suggested by your caregivers. Avoid periods of inactivity such as sitting longer than an hour  when not asleep. This helps prevent blood clots.  You may resume a sexual relationship in one month or when given the OK by your doctor.  You may return to work once you are cleared by your doctor.  Do not drive a car for 6 weeks or until released by you surgeon.  Do not drive while taking narcotics.  WEIGHT BEARING Weight bearing as tolerated with assist device (walker, cane, etc) as directed, use it as long as suggested by your surgeon or therapist, typically at least 4-6 weeks.  POSTOPERATIVE CONSTIPATION PROTOCOL Constipation - defined medically as fewer than three stools per week and severe constipation as less than one stool per week.  One of the most common issues patients have following surgery is constipation.  Even if you have a regular bowel pattern at home, your normal regimen is likely to be disrupted due to multiple reasons following surgery.  Combination of anesthesia, postoperative narcotics, change in appetite and fluid intake all can affect your bowels.  In order to avoid complications following surgery, here are some recommendations in  order to help you during your recovery period.  Colace (docusate) - Pick up an over-the-counter form of Colace or another stool softener and take twice a day as long as you are requiring postoperative pain medications.  Take with a full glass of water daily.  If you experience loose stools or diarrhea, hold the colace until you stool forms back up.  If your symptoms do not get better within 1 week or if they get worse, check with your doctor.  Dulcolax (bisacodyl) - Pick up over-the-counter and take as directed by the product packaging as needed to assist with the movement of your bowels.  Take with a full glass of water.  Use this product as needed if not relieved by Colace only.   MiraLax (polyethylene glycol) - Pick up over-the-counter to have on hand.  MiraLax is a solution that will increase the amount of water in your bowels to assist with bowel movements.  Take as directed and can mix with a glass of water, juice, soda, coffee, or tea.  Take if you go more than two days without a movement. Do not use MiraLax more than once per day. Call your doctor if you are still constipated or irregular after using this medication for 7 days in a row.  If you continue to have problems with postoperative constipation, please contact the office for further assistance and recommendations.  If you experience "the worst abdominal pain ever" or develop nausea or vomiting, please contact the office immediatly for further recommendations for treatment.  ITCHING  If you experience itching with your medications, try taking only a single pain pill, or even half a pain pill at a time.  You can also use Benadryl over the counter for itching or also to help with sleep.   TED HOSE STOCKINGS Wear the elastic stockings on both legs for three weeks following surgery during the day but you may remove then at night for sleeping.  MEDICATIONS See your medication summary on the "After Visit Summary" that the nursing staff will  review with you prior to discharge.  You may have some home medications which will be placed on hold until you complete the course of blood thinner medication.  It is important for you to complete the blood thinner medication as prescribed by your surgeon.  Continue your approved medications as instructed at time of discharge.  PRECAUTIONS If you experience chest pain  or shortness of breath - call 911 immediately for transfer to the hospital emergency department.  If you develop a fever greater that 101 F, purulent drainage from wound, increased redness or drainage from wound, foul odor from the wound/dressing, or calf pain - CONTACT YOUR SURGEON.                                                   FOLLOW-UP APPOINTMENTS Make sure you keep all of your appointments after your operation with your surgeon and caregivers. You should call the office at the above phone number and make an appointment for approximately two weeks after the date of your surgery or on the date instructed by your surgeon outlined in the "After Visit Summary".   RANGE OF MOTION AND STRENGTHENING EXERCISES  Rehabilitation of the knee is important following a knee injury or an operation. After just a few days of immobilization, the muscles of the thigh which control the knee become weakened and shrink (atrophy). Knee exercises are designed to build up the tone and strength of the thigh muscles and to improve knee motion. Often times heat used for twenty to thirty minutes before working out will loosen up your tissues and help with improving the range of motion but do not use heat for the first two weeks following surgery. These exercises can be done on a training (exercise) mat, on the floor, on a table or on a bed. Use what ever works the best and is most comfortable for you Knee exercises include:  Leg Lifts - While your knee is still immobilized in a splint or cast, you can do straight leg raises. Lift the leg to 60 degrees, hold for  3 sec, and slowly lower the leg. Repeat 10-20 times 2-3 times daily. Perform this exercise against resistance later as your knee gets better.  Quad and Hamstring Sets - Tighten up the muscle on the front of the thigh (Quad) and hold for 5-10 sec. Repeat this 10-20 times hourly. Hamstring sets are done by pushing the foot backward against an object and holding for 5-10 sec. Repeat as with quad sets.  Leg Slides: Lying on your back, slowly slide your foot toward your buttocks, bending your knee up off the floor (only go as far as is comfortable). Then slowly slide your foot back down until your leg is flat on the floor again. Angel Wings: Lying on your back spread your legs to the side as far apart as you can without causing discomfort.  A rehabilitation program following serious knee injuries can speed recovery and prevent re-injury in the future due to weakened muscles. Contact your doctor or a physical therapist for more information on knee rehabilitation.   IF YOU ARE TRANSFERRED TO A SKILLED REHAB FACILITY If the patient is transferred to a skilled rehab facility following release from the hospital, a list of the current medications will be sent to the facility for the patient to continue.  When discharged from the skilled rehab facility, please have the facility set up the patient's [place] prior to being released. Also, the skilled facility will be responsible for providing the patient with their medications at time of release from the facility to include their pain medication, the muscle relaxants, and their blood thinner medication. If the patient is still at the rehab facility  at time of the two week follow up appointment, the skilled rehab facility will also need to assist the patient in arranging follow up appointment in our office and any transportation needs.  MAKE SURE YOU:  Understand these instructions.  Get help right away if you are not doing well or get worse.     Pick up stool softner and laxative for home use following surgery while on pain medications. Do not submerge incision under water. Please use good hand washing techniques while changing dressing each day. May shower starting three days after surgery. Please use a clean towel to pat the incision dry following showers. Continue to use ice for pain and swelling after surgery. Do not use any lotions or creams on the incision until instructed by your surgeon. Information on my medicine - XARELTO (Rivaroxaban)  This medication education was reviewed with me or my healthcare representative as part of my discharge preparation.  The pharmacist that spoke with me during my hospital stay was:  Angela [facility]  Why was Xarelto prescribed for you? Xarelto was prescribed for you to reduce the risk of blood clots forming after orthopedic surgery. The medical term for these abnormal blood clots is venous thromboembolism (VTE).  What do you need to know about xarelto ? Take your Xarelto ONCE DAILY at the same time every day. You may take it either with or without food.  If you have difficulty swallowing the tablet whole, you may crush it and mix in applesauce just prior to taking your dose.  Take Xarelto exactly as prescribed by your doctor and DO NOT stop taking Xarelto without talking to the doctor who prescribed the medication.  Stopping without other VTE prevention medication to take the place of Xarelto may increase your risk of developing a clot.  After discharge, you should have regular check-up appointments with your healthcare provider that is prescribing your Xarelto.    What do you do if you miss a dose? If you miss a dose, take it as soon as you remember on the same day then continue your regularly scheduled once daily regimen the next day. Do not take two doses of Xarelto on the same day.   Important Safety Information A possible side effect of Xarelto is bleeding. You  should call your healthcare provider right away if you experience any of the following: Bleeding from an injury or your nose that does not stop. Unusual colored urine (red or dark brown) or unusual colored stools (red or black). Unusual bruising for unknown reasons. A serious fall or if you hit your head (even if there is no bleeding).  Some medicines may interact with Xarelto and might increase your risk of bleeding while on Xarelto. To help avoid this, consult your healthcare provider or pharmacist prior to using any new prescription or non-prescription medications, including herbals, vitamins, non-steroidal anti-inflammatory drugs (NSAIDs) and supplements.  This website has more information on Xarelto: [email or web address]     Increase activity slowly as tolerated    Complete by:  As directed             Medication List    STOP taking these medications        aspirin 81 MG tablet     CALTRATE 600+D 600-400 MG-UNIT tablet  Generic drug:  Calcium Carbonate-Vitamin D     cephALEXin 500 MG capsule  Commonly known as:  KEFLEX     cyanocobalamin 1000 MCG/ML injection  Commonly known as:  (VITAMIN B-12)  metaxalone 800 MG tablet  Commonly known as:  SKELAXIN     multivitamin with minerals Tabs tablet     nabumetone 500 MG tablet  Commonly known as:  RELAFEN     naproxen sodium 220 MG tablet  Commonly known as:  ANAPROX     ondansetron 4 MG disintegrating tablet  Commonly known as:  ZOFRAN ODT     Vitamin D3 1000 units Caps      TAKE these medications        atenolol 50 MG tablet  Commonly known as:  TENORMIN  TAKE 1 TABLET (50 MG TOTAL) BY MOUTH DAILY.     docusate sodium 100 MG capsule  Commonly known as:  COLACE  Take 100 mg by mouth daily.     HYDROmorphone 2 MG tablet  Commonly known as:  DILAUDID  Take 1-2 tablets (2-4 mg total) by mouth every 4 (four) hours as needed for severe pain.     levothyroxine 150 MCG tablet  Commonly known as:  SYNTHROID,  LEVOTHROID  Take 1 tablet (150 mcg total) by mouth daily.     methocarbamol 500 MG tablet  Commonly known as:  ROBAXIN  Take 1 tablet (500 mg total) by mouth every 6 (six) hours as needed for muscle spasms.     polyethylene glycol packet  Commonly known as:  MIRALAX / GLYCOLAX  Take 17 g by mouth daily as needed for mild constipation. Reported on 01/31/2016     rivaroxaban 10 MG Tabs tablet  Commonly known as:  XARELTO  Take 1 tablet (10 mg total) by mouth daily with breakfast.     rosuvastatin 40 MG tablet  Commonly known as:  CRESTOR  Take 20 mg by mouth every evening.     SYSTANE BALANCE OP  Apply 1 drop to eye 2 (two) times daily as needed (Dry eyes). Reported on 01/31/2016     triamcinolone cream 0.1 %  Commonly known as:  KENALOG  Apply 1 application topically 3 (three) times daily. To affected area of right leg.           Follow-up Information    Follow up with [name], MD. Schedule an appointment as soon as possible for a visit on 02/28/2016.   Specialty:  Orthopedic Surgery   Why:  Call [phone] tomorrow to make the appointment   Contact information:   [address] [place] [address] [phone]       Follow up with [facility].   Why:  physical therapy   Contact information:   [address] [place] [address] [phone]       Signed: Ardeen Jourdain, PA-C Orthopaedic Surgery 02/15/2016, 3:15 PM

## 2016-02-27 ENCOUNTER — Encounter: Payer: Self-pay | Admitting: Physical Therapy

## 2016-02-27 ENCOUNTER — Ambulatory Visit: Payer: BLUE CROSS/BLUE SHIELD | Attending: Orthopedic Surgery | Admitting: Physical Therapy

## 2016-02-27 DIAGNOSIS — R601 Generalized edema: Secondary | ICD-10-CM | POA: Diagnosis present

## 2016-02-27 DIAGNOSIS — M25561 Pain in right knee: Secondary | ICD-10-CM | POA: Diagnosis present

## 2016-02-27 DIAGNOSIS — M25661 Stiffness of right knee, not elsewhere classified: Secondary | ICD-10-CM | POA: Insufficient documentation

## 2016-02-27 NOTE — Therapy (Signed)
[facility] [place], [place], [address] Phone: [phone]   Fax:  [phone]  Physical Therapy Evaluation  Patient Details  Name: Miranda Wilson MRN: [id number] Date of Birth: January 29, 1951 Referring Provider: Gorden Harms, MD  Encounter Date: 02/27/2016      PT End of Session - 02/27/16 1049    Visit Number 1   Number of Visits 18   Date for PT Re-Evaluation 04/09/16   PT Start Time 1050   PT Stop Time 1137   PT Time Calculation (min) 47 min   Activity Tolerance Patient tolerated treatment well   Behavior During Therapy [facility] for tasks assessed/performed      Past Medical History  Diagnosis Date  . Hyperlipidemia   . Hypertension   . IBS (irritable bowel syndrome)   . Varicose veins   . PMB (postmenopausal bleeding)   . History of idiopathic seizure     AGE 73 to 20  X5  --  UNKNOW IDIOLOGY , PER PT WAS ANEMIC AT THE TIME--  NONE SINCE  . S/P radioactive iodine thyroid ablation   . History of tachycardia     S/P  RADIOACTIVE IODINE ABLATION OF THYROID  . History of colon polyps   . Cervical spondylosis   . OA (osteoarthritis)     shoulders  left > right  . GERD (gastroesophageal reflux disease)   . Chronic constipation   . Urge urinary incontinence   . Bilateral edema of lower extremity     left > right --  wear compression hose  . Hypothyroidism, postradioiodine therapy     AGE 25  . At risk for sleep apnea   . Vein disorder     LEFT ANKLE VEIN VALVE REFLUX WITH DECREASED CIRCULATION     Past Surgical History  Procedure Laterality Date  . Varicose vein surgery  1998 to 2010    includes laser and phlebectomies  . Cesarean section  1979  . Endovenous ablation saphenous vein w/ laser  04/2007  . Tonsillectomy  1955  . Colonoscopy w/ polypectomy  10-25-2008  . Total knee arthroplasty Left 12-19-2009  . Closed left knee manipulation and right knee cortisone injection  02-20-2010  . Left knee  arthrotomy w/ lysis adhesions  12-01-2010  . Laparoscopic cholecystectomy  1993    and BILATERAL TUBAL LIGATION  . Cataract extraction w/ intraocular lens  implant, bilateral  right 2011//  left 2013  . Hysteroscopy w/d&c N/A 11/11/2014    Procedure: DILATATION AND CURETTAGE /HYSTEROSCOPY;  Surgeon: Gus Height, MD;  Location: [facility];  Service: Gynecology;  Laterality: N/A;  . Eye surgery    . Dilation and curettage of uterus    . Joint replacement    . Total knee arthroplasty Right 02/13/2016    Procedure: RIGHT TOTAL KNEE ARTHROPLASTY;  Surgeon: Gaynelle Arabian, MD;  Location: [facility];  Service: Orthopedics;  Laterality: Right;    There were no vitals filed for this visit.       Subjective Assessment - 02/27/16 1042    Subjective Patient had R TKR 02/13/16.    Pertinent History HTN, venous insuffieciency, back problems, disc problems, L TKR 2011   Patient Stated Goals walk and stand with good balance/comfort; regain ROM   Currently in Pain? Yes   Pain Score 4    Pain Location Knee   Pain Orientation Right   Pain Descriptors / Indicators Sore   Pain Type Surgical pain   Pain Onset 1  to 4 weeks ago   Pain Frequency Intermittent   Aggravating Factors  exercise, prolonged standing and sitting   Pain Relieving Factors changing position, meds, ice, elevation, movment   Effect of Pain on Daily Activities must walk with AD            [facility] PT Assessment - 02/27/16 0001    Assessment   Medical Diagnosis S/P R TKR   Referring Provider Gorden Harms, MD   Onset Date/Surgical Date 02/13/16   Next MD Visit 02/28/16   Precautions   Precautions Knee   Balance Screen   Has the patient fallen in the past 6 months No   Has the patient had a decrease in activity level because of a fear of falling?  Yes   Is the patient reluctant to leave their home because of a fear of falling?  Yes   [place] Private residence   Living Arrangements  Spouse/significant other   Type of [place] entrance   [place] One level   [place] - quad   Prior Function   Level of [place] Retired   Observation/Other Assessments   Focus on Therapeutic Outcomes (FOTO)  67% limited   Observation/Other Assessments-Edema    Edema Figure 8;Circumferential   Circumferential Edema   Circumferential - Right 48 cm   Circumferential - Left  44.5   Figure 8 Edema   Figure 8 - Right  53 cm  ankle   Figure 8 - Left  52 cm  ankle   ROM / Strength   AROM / PROM / Strength AROM;PROM;Strength   AROM   AROM Assessment Site Knee   Right/Left Knee Right   Right Knee Extension -16   Right Knee Flexion 88   PROM   PROM Assessment Site Knee   Right/Left Knee Right   Right Knee Extension -10   Right Knee Flexion 100   Strength   Strength Assessment Site Hip;Knee   Right/Left Hip Right   Right Hip Flexion 4-/5   Right Hip ABduction 5/5  sitting   Right Hip ADduction 4/5   Right/Left Knee Right   Right Knee Flexion 5/5   Right Knee Extension 4+/5   Palpation   Patella mobility decreased sup/inf mobility   Palpation comment two blisters present med and lat of incision; redness around distal incision and lower medial leg; slight warmth   Ambulation/Gait   Ambulation/Gait Yes   Ambulation/Gait Assistance 6: Modified independent (Device/Increase time)   Ambulation Distance (Feet) 30 Feet   Assistive device Small based quad cane   Gait Pattern Step-through pattern;Decreased step length - right;Decreased step length - left;Decreased stance time - right;Decreased stance time - left;Decreased stride length;Right flexed knee in stance   Ambulation Surface Level                   [facility] Adult PT Treatment/Exercise - 02/27/16 0001    Modalities   Modalities Electrical Stimulation;Vasopneumatic   Electrical Stimulation   Electrical Stimulation Location R knee   Electrical  Stimulation Action IFC   Electrical Stimulation Parameters 1-10 Hz to tolerance x 15 min   Electrical Stimulation Goals Edema;Pain   Vasopneumatic   Number Minutes Vasopneumatic  15 minutes   Vasopnuematic Location  Knee   Vasopneumatic Pressure Low   Vasopneumatic Temperature  55 deg  PT Education - 02/27/16 1243    Education provided Yes   Education Details Continue with HEP; Elevate higher and walk more frequently   Person(s) Educated Patient;Spouse   Methods Explanation   Comprehension Verbalized understanding          PT Short Term Goals - 02/27/16 1254    PT SHORT TERM GOAL #1   Title Decreased edema in R knee to within 1.5 cm of L   Time 3   Period Weeks   Status New           PT Long Term Goals - 02/27/16 1253    PT LONG TERM GOAL #1   Title I with HEP   Time 6   Period Weeks   Status New   PT LONG TERM GOAL #2   Title Improved R knee ROM -5 to 120 degrees to improve function   Time 6   Period Weeks   Status New   PT LONG TERM GOAL #3   Title Patient able to amb 300 feet safely without AD   Time 6   Period Weeks   Status New   PT LONG TERM GOAL #4   Title Able to climb stairs with reciprocal gait and 5/5 knee strength   Time 6   Period Weeks   Status New               Plan - 02/27/16 1244    Clinical Impression Statement Patient presents 2 weeks s/p R TKR. She has significant edema in RLE to foot and redness around knee and lower leg with two healing blisters around incision. She amb with [facility] with deficits.    Rehab Potential Excellent   PT Frequency 3x / week   PT Duration 6 weeks   PT Treatment/Interventions ADLs/Self Care Home Management;Electrical Stimulation;Cryotherapy;Therapeutic exercise;Manual techniques;Vasopneumatic Device;Passive range of motion;Patient/family education;Gait training;Stair training;Balance training;Neuromuscular re-education;Scar mobilization   PT Next Visit Plan TKR protocol; gait  training, edema management; modalities for pain/edema.   Consulted and Agree with Plan of Care Patient      Patient will benefit from skilled therapeutic intervention in order to improve the following deficits and impairments:  Abnormal gait, Decreased range of motion, Pain, Decreased scar mobility, Decreased strength, Increased edema, Decreased mobility  Visit Diagnosis: Pain in right knee - Plan: PT plan of care cert/re-cert  Stiffness of right knee, not elsewhere classified - Plan: PT plan of care cert/re-cert  Generalized edema - Plan: PT plan of care cert/re-cert      G-Codes - [id number] 1258    Functional Assessment Tool Used FOTO 67% LIMITED   Functional Limitation Mobility: Walking and moving around   Mobility: Walking and Moving Around Current Status [id number]) At least 60 percent but less than 80 percent impaired, limited or restricted   Mobility: Walking and Moving Around Goal Status [phone]) At least 40 percent but less than 60 percent impaired, limited or restricted       Problem List Patient Active Problem List   Diagnosis Date Noted  . OA (osteoarthritis) of knee 02/13/2016  . Leg edema 12/07/2015  . Preop cardiovascular exam 12/07/2015  . B12 deficiency 12/08/2014  . Metabolic syndrome [id number]  . Bradycardia 05/31/2014  . Hyperlipemia 04/28/2013  . Vitamin D deficiency 04/28/2013  . Lumbar spondylosis with myelopathy 04/28/2013  . Osteoarthritis 04/28/2013  . Hypertension 04/28/2013  . Incontinence of urine 04/28/2013  . Hypothyroidism 04/28/2013  . Cough 08/21/2011  . Abnormal CXR 08/21/2011   Madelyn Flavors  PT  02/27/2016, 1:03 PM  [facility] [place], [place], [address] Phone: [phone]   Fax:  [phone]  Name: Miranda Wilson MRN: [id number] Date of Birth: 1951/07/25

## 2016-02-29 ENCOUNTER — Encounter: Payer: Self-pay | Admitting: Physical Therapy

## 2016-02-29 ENCOUNTER — Ambulatory Visit: Payer: BLUE CROSS/BLUE SHIELD | Admitting: Physical Therapy

## 2016-02-29 DIAGNOSIS — M25661 Stiffness of right knee, not elsewhere classified: Secondary | ICD-10-CM

## 2016-02-29 DIAGNOSIS — M25561 Pain in right knee: Secondary | ICD-10-CM | POA: Diagnosis not present

## 2016-02-29 DIAGNOSIS — R601 Generalized edema: Secondary | ICD-10-CM

## 2016-02-29 NOTE — Therapy (Signed)
[facility] [place], [place], [address] Phone: [phone]   Fax:  [phone]  Physical Therapy Treatment  Patient Details  Name: Miranda Wilson MRN: [id number] Date of Birth: Mar 18, 1951 Referring Provider: Gorden Harms, MD  Encounter Date: 02/29/2016      PT End of Session - 02/29/16 0949    Visit Number 2   Number of Visits 18   Date for PT Re-Evaluation 04/09/16   PT Start Time 0947   PT Stop Time 1043   PT Time Calculation (min) 56 min   Activity Tolerance Patient tolerated treatment well   Behavior During Therapy [facility] for tasks assessed/performed      Past Medical History  Diagnosis Date  . Hyperlipidemia   . Hypertension   . IBS (irritable bowel syndrome)   . Varicose veins   . PMB (postmenopausal bleeding)   . History of idiopathic seizure     AGE 6 to 20  X5  --  UNKNOW IDIOLOGY , PER PT WAS ANEMIC AT THE TIME--  NONE SINCE  . S/P radioactive iodine thyroid ablation   . History of tachycardia     S/P  RADIOACTIVE IODINE ABLATION OF THYROID  . History of colon polyps   . Cervical spondylosis   . OA (osteoarthritis)     shoulders  left > right  . GERD (gastroesophageal reflux disease)   . Chronic constipation   . Urge urinary incontinence   . Bilateral edema of lower extremity     left > right --  wear compression hose  . Hypothyroidism, postradioiodine therapy     AGE 25  . At risk for sleep apnea   . Vein disorder     LEFT ANKLE VEIN VALVE REFLUX WITH DECREASED CIRCULATION     Past Surgical History  Procedure Laterality Date  . Varicose vein surgery  1998 to 2010    includes laser and phlebectomies  . Cesarean section  1979  . Endovenous ablation saphenous vein w/ laser  04/2007  . Tonsillectomy  1955  . Colonoscopy w/ polypectomy  10-25-2008  . Total knee arthroplasty Left 12-19-2009  . Closed left knee manipulation and right knee cortisone injection  02-20-2010  . Left knee arthrotomy  w/ lysis adhesions  12-01-2010  . Laparoscopic cholecystectomy  1993    and BILATERAL TUBAL LIGATION  . Cataract extraction w/ intraocular lens  implant, bilateral  right 2011//  left 2013  . Hysteroscopy w/d&c N/A 11/11/2014    Procedure: DILATATION AND CURETTAGE /HYSTEROSCOPY;  Surgeon: Gus Height, MD;  Location: [facility];  Service: Gynecology;  Laterality: N/A;  . Eye surgery    . Dilation and curettage of uterus    . Joint replacement    . Total knee arthroplasty Right 02/13/2016    Procedure: RIGHT TOTAL KNEE ARTHROPLASTY;  Surgeon: Gaynelle Arabian, MD;  Location: [facility];  Service: Orthopedics;  Laterality: Right;    There were no vitals filed for this visit.      Subjective Assessment - 02/29/16 0948    Subjective Reports soreness in R knee with movement.   Pertinent History HTN, venous insuffieciency, back problems, disc problems, L TKR 2011   Patient Stated Goals walk and stand with good balance/comfort; regain ROM   Currently in Pain? Yes   Pain Score 4    Pain Location Knee   Pain Orientation Right   Pain Descriptors / Indicators Sore   Pain Type Surgical pain   Pain Onset 1  to 4 weeks ago   Pain Frequency Intermittent   Aggravating Factors  Movement            [facility] PT Assessment - 02/29/16 0001    Assessment   Medical Diagnosis S/P R TKR   Onset Date/Surgical Date 02/13/16   Next MD Visit 03/20/2016   Precautions   Precautions Knee                     [facility] Adult PT Treatment/Exercise - 02/29/16 0001    Exercises   Exercises Knee/Hip   Knee/Hip Exercises: Stretches   Active Hamstring Stretch Right;3 reps;30 seconds   Knee/Hip Exercises: Aerobic   Nustep L3, seat 10 x12 min for knee ROM   Knee/Hip Exercises: Standing   Forward Lunges Right;2 sets;10 reps;3 seconds  6" step   Rocker Board 3 minutes   Knee/Hip Exercises: Seated   Long Arc Quad Strengthening;Right;2 sets;10 reps;Weights   Long Arc Quad Weight 3 lbs.    Knee/Hip Exercises: Supine   Straight Leg Raises Strengthening;Right;2 sets;10 reps   Knee/Hip Exercises: Sidelying   Hip ABduction Strengthening;Right;2 sets;10 reps   Modalities   Modalities Electrical Stimulation;Vasopneumatic   Acupuncturist Location R knee   Electrical Stimulation Action IFC   Electrical Stimulation Parameters 1-10 Hz x15 min   Electrical Stimulation Goals Edema;Pain   Vasopneumatic   Number Minutes Vasopneumatic  15 minutes   Vasopnuematic Location  Knee   Vasopneumatic Pressure Low   Vasopneumatic Temperature  50   Manual Therapy   Manual Therapy Soft tissue mobilization;Passive ROM   Soft tissue mobilization STW to R knee incision to proper proper mobility; STW to distal Quad to decrease tightness   Passive ROM PROM of R knee into flexion in sitting to increase R knee flexion                  PT Short Term Goals - 02/27/16 1254    PT SHORT TERM GOAL #1   Title Decreased edema in R knee to within 1.5 cm of L   Time 3   Period Weeks   Status New           PT Long Term Goals - 02/27/16 1253    PT LONG TERM GOAL #1   Title I with HEP   Time 6   Period Weeks   Status New   PT LONG TERM GOAL #2   Title Improved R knee ROM -5 to 120 degrees to improve function   Time 6   Period Weeks   Status New   PT LONG TERM GOAL #3   Title Patient able to amb 300 feet safely without AD   Time 6   Period Weeks   Status New   PT LONG TERM GOAL #4   Title Able to climb stairs with reciprocal gait and 5/5 knee strength   Time 6   Period Weeks   Status New               Plan - 02/29/16 1035    Clinical Impression Statement Patient tolerated today's treatment well with only reports of discomfort with L sidelying for hip abduction. Completed all exercises fairly well and asked to be gentle around R ankle due to soreness and tenderness. PROM of R knee completed in sitting as to not cause increased pressure to  R ankle. Superior R knee incision mobilized well although 3-4 steristrips are still in place in  mid incision. Patient was educated to continue HEP given by HHPT especially prone hang and heel slides to improve ROM. Normal modalites response noted following removal with low setting on vasopneumatic and temperature decreased per patient request. Patient denied pain following end of treatment and continues to ambulate with [facility].   Rehab Potential Excellent   PT Frequency 3x / week   PT Duration 6 weeks   PT Treatment/Interventions ADLs/Self Care Home Management;Electrical Stimulation;Cryotherapy;Therapeutic exercise;Manual techniques;Vasopneumatic Device;Passive range of motion;Patient/family education;Gait training;Stair training;Balance training;Neuromuscular re-education;Scar mobilization   PT Next Visit Plan TKR protocol; gait training, edema management; modalities for pain/edema.   Consulted and Agree with Plan of Care Patient      Patient will benefit from skilled therapeutic intervention in order to improve the following deficits and impairments:  Abnormal gait, Decreased range of motion, Pain, Decreased scar mobility, Decreased strength, Increased edema, Decreased mobility  Visit Diagnosis: Pain in right knee  Stiffness of right knee, not elsewhere classified  Generalized edema     Problem List Patient Active Problem List   Diagnosis Date Noted  . OA (osteoarthritis) of knee 02/13/2016  . Leg edema 12/07/2015  . Preop cardiovascular exam 12/07/2015  . B12 deficiency 12/08/2014  . Metabolic syndrome [id number]  . Bradycardia 05/31/2014  . Hyperlipemia 04/28/2013  . Vitamin D deficiency 04/28/2013  . Lumbar spondylosis with myelopathy 04/28/2013  . Osteoarthritis 04/28/2013  . Hypertension 04/28/2013  . Incontinence of urine 04/28/2013  . Hypothyroidism 04/28/2013  . Cough 08/21/2011  . Abnormal CXR 08/21/2011    Wynelle Fanny, PTA 02/29/2016, 11:10 AM  [facility] [address] [place], [place], [address] Phone: [phone]   Fax:  [phone]  Name: Miranda Wilson MRN: [id number] Date of Birth: February 17, 1951

## 2016-03-01 ENCOUNTER — Telehealth: Payer: Self-pay | Admitting: Family Medicine

## 2016-03-01 NOTE — Telephone Encounter (Signed)
Spoke with pt and advised to keep her appt with [facility] and just remind him about the blood thinner at the appt. Pt voiced understanding.

## 2016-03-02 ENCOUNTER — Ambulatory Visit: Payer: BLUE CROSS/BLUE SHIELD | Admitting: Physical Therapy

## 2016-03-02 DIAGNOSIS — M25561 Pain in right knee: Secondary | ICD-10-CM

## 2016-03-02 DIAGNOSIS — M25661 Stiffness of right knee, not elsewhere classified: Secondary | ICD-10-CM

## 2016-03-02 NOTE — Therapy (Signed)
[facility] [place], [place], [address] Phone: [phone]   Fax:  [phone]  Physical Therapy Treatment  Patient Details  Name: KATHREN CHAR MRN: [id number] Date of Birth: 10/21/1951 Referring Provider: Gorden Harms, MD  Encounter Date: 03/02/2016      PT End of Session - 03/02/16 1335    Visit Number 3   Number of Visits 18   Date for PT Re-Evaluation 04/09/16   PT Start Time 1030   PT Stop Time 1137   PT Time Calculation (min) 67 min      Past Medical History  Diagnosis Date  . Hyperlipidemia   . Hypertension   . IBS (irritable bowel syndrome)   . Varicose veins   . PMB (postmenopausal bleeding)   . History of idiopathic seizure     AGE 65 to 65  X5  --  UNKNOW IDIOLOGY , PER PT WAS ANEMIC AT THE TIME--  NONE SINCE  . S/P radioactive iodine thyroid ablation   . History of tachycardia     S/P  RADIOACTIVE IODINE ABLATION OF THYROID  . History of colon polyps   . Cervical spondylosis   . OA (osteoarthritis)     shoulders  left > right  . GERD (gastroesophageal reflux disease)   . Chronic constipation   . Urge urinary incontinence   . Bilateral edema of lower extremity     left > right --  wear compression hose  . Hypothyroidism, postradioiodine therapy     AGE 65  . At risk for sleep apnea   . Vein disorder     LEFT ANKLE VEIN VALVE REFLUX WITH DECREASED CIRCULATION     Past Surgical History  Procedure Laterality Date  . Varicose vein surgery  1998 to 2010    includes laser and phlebectomies  . Cesarean section  1979  . Endovenous ablation saphenous vein w/ laser  04/2007  . Tonsillectomy  1955  . Colonoscopy w/ polypectomy  10-25-2008  . Total knee arthroplasty Left 12-19-2009  . Closed left knee manipulation and right knee cortisone injection  02-20-2010  . Left knee arthrotomy w/ lysis adhesions  12-01-2010  . Laparoscopic cholecystectomy  1993    and BILATERAL TUBAL LIGATION  .  Cataract extraction w/ intraocular lens  implant, bilateral  right 2011//  left 2013  . Hysteroscopy w/d&c N/A 11/11/2014    Procedure: DILATATION AND CURETTAGE /HYSTEROSCOPY;  Surgeon: Gus Height, MD;  Location: [facility];  Service: Gynecology;  Laterality: N/A;  . Eye surgery    . Dilation and curettage of uterus    . Joint replacement    . Total knee arthroplasty Right 02/13/2016    Procedure: RIGHT TOTAL KNEE ARTHROPLASTY;  Surgeon: Gaynelle Arabian, MD;  Location: [facility];  Service: Orthopedics;  Laterality: Right;    There were no vitals filed for this visit.      Subjective Assessment - 03/02/16 1334    Subjective Knee still sore.                                   PT Short Term Goals - 02/27/16 1254    PT SHORT TERM GOAL #1   Title Decreased edema in R knee to within 1.5 cm of L   Time 3   Period Weeks   Status New           PT Long Term  Goals - 02/27/16 1253    PT LONG TERM GOAL #1   Title I with HEP   Time 6   Period Weeks   Status New   PT LONG TERM GOAL #2   Title Improved R knee ROM -5 to 120 degrees to improve function   Time 6   Period Weeks   Status New   PT LONG TERM GOAL #3   Title Patient able to amb 300 feet safely without AD   Time 6   Period Weeks   Status New   PT LONG TERM GOAL #4   Title Able to climb stairs with reciprocal gait and 5/5 knee strength   Time 6   Period Weeks   Status New             Patient will benefit from skilled therapeutic intervention in order to improve the following deficits and impairments:     Visit Diagnosis: Pain in right knee  Stiffness of right knee, not elsewhere classified     Problem List Patient Active Problem List   Diagnosis Date Noted  . OA (osteoarthritis) of knee 02/13/2016  . Leg edema 12/07/2015  . Preop cardiovascular exam 12/07/2015  . B12 deficiency 12/08/2014  . Metabolic syndrome [id number]  . Bradycardia 05/31/2014  .  Hyperlipemia 04/28/2013  . Vitamin D deficiency 04/28/2013  . Lumbar spondylosis with myelopathy 04/28/2013  . Osteoarthritis 04/28/2013  . Hypertension 04/28/2013  . Incontinence of urine 04/28/2013  . Hypothyroidism 04/28/2013  . Cough 08/21/2011  . Abnormal CXR 08/21/2011   Treatment:  Nustep level 4 x 15 minutes moving forward x 1 to increase right knee flexion.  Seated right knee passive flexion and extension stretching (PROM) and joint mobs and then in supine continued PROM into flexon and extension and STW/M to proximal Gastroc and distal hamstrings (23 minutes)... Vasopneumatic and low level IFC x 15 minutes.  Patient did very well today.  Kendricks Reap, [place] MPT 03/02/2016, 1:46 PM  [facility] [address] [place], [place], [address] Phone: [phone]   Fax:  [phone]  Name: DAWNIELLE TACKMAN MRN: [id number] Date of Birth: Oct 19, 1951

## 2016-03-05 ENCOUNTER — Encounter: Payer: Self-pay | Admitting: Family Medicine

## 2016-03-05 ENCOUNTER — Ambulatory Visit (INDEPENDENT_AMBULATORY_CARE_PROVIDER_SITE_OTHER): Payer: BLUE CROSS/BLUE SHIELD | Admitting: Family Medicine

## 2016-03-05 VITALS — BP 126/62 | HR 52 | Temp 96.6°F | Ht 63.0 in | Wt 201.0 lb

## 2016-03-05 DIAGNOSIS — E559 Vitamin D deficiency, unspecified: Secondary | ICD-10-CM

## 2016-03-05 DIAGNOSIS — E038 Other specified hypothyroidism: Secondary | ICD-10-CM | POA: Diagnosis not present

## 2016-03-05 DIAGNOSIS — E785 Hyperlipidemia, unspecified: Secondary | ICD-10-CM

## 2016-03-05 DIAGNOSIS — E538 Deficiency of other specified B group vitamins: Secondary | ICD-10-CM | POA: Diagnosis not present

## 2016-03-05 DIAGNOSIS — I10 Essential (primary) hypertension: Secondary | ICD-10-CM | POA: Diagnosis not present

## 2016-03-05 DIAGNOSIS — Z96651 Presence of right artificial knee joint: Secondary | ICD-10-CM | POA: Diagnosis not present

## 2016-03-05 DIAGNOSIS — M150 Primary generalized (osteo)arthritis: Secondary | ICD-10-CM

## 2016-03-05 DIAGNOSIS — M159 Polyosteoarthritis, unspecified: Secondary | ICD-10-CM

## 2016-03-05 DIAGNOSIS — M170 Bilateral primary osteoarthritis of knee: Secondary | ICD-10-CM

## 2016-03-05 NOTE — Patient Instructions (Addendum)
Medicare Annual Wellness Visit  [place] and the medical providers at [facility] strive to bring you the best medical care.  In doing so we not only want to address your current medical conditions and concerns but also to detect new conditions early and prevent illness, disease and health-related problems.    Medicare offers a yearly Wellness Visit which allows our clinical staff to assess your need for preventative services including immunizations, lifestyle education, counseling to decrease risk of preventable diseases and screening for fall risk and other medical concerns.    This visit is provided free of charge (no copay) for all Medicare recipients. The clinical pharmacists at [facility] have begun to conduct these Wellness Visits which will also include a thorough review of all your medications.    As you primary medical provider recommend that you make an appointment for your Annual Wellness Visit if you have not done so already this year.  You may set up this appointment before you leave today or you may call back [phone] and schedule an appointment.  Please make sure when you call that you mention that you are scheduling your Annual Wellness Visit with the clinical pharmacist so that the appointment may be made for the proper length of time.     Continue current medications. Continue good therapeutic lifestyle changes which include good diet and exercise. Fall precautions discussed with patient. If an FOBT was given today- please return it to our front desk. If you are over 30 years old - you may need Prevnar 88 or the adult Pneumonia vaccine.  **Flu shots are available--- please call and schedule a FLU-CLINIC appointment**  After your visit with [place] today you will receive a survey in the mail or online from [employer] regarding your care with [place]. Please take a moment to fill this out. Your feedback is very  important to [place] as you can help [place] better understand your patient needs as well as improve your experience and satisfaction. WE CARE ABOUT YOU!!!   Continue physical therapy Continue follow-up with orthopedic surgeon as planned We will call with results of lab work as soon as those results become available and most likely we will reinitiate your B12 injections. Continue to drink plenty of fluids and stay well hydrated

## 2016-03-05 NOTE — Progress Notes (Signed)
Subjective:    Patient ID: Miranda Wilson, female    DOB: Oct 12, 1951, 65 y.o.   MRN: [id number]  HPI Pt here for follow up and management of chronic medical problems which includes hypothyroid, hyperlipidemia, and hypertension. She is taking medications regularly.The patient is doing well with her right total knee replacement 3 weeks ago. She is excited that she is in less pain and has had such good results so far. Is important to note that she has lost 9 pounds of weight since her last visit here. She is due to get a pelvic exam and mammogram and use against this done from her gynecologist, Dr. Harrington Challenger and we'll schedule this herself. She will get lab work today. Her BMI currently is 37.2. She has 2 or more comorbidities medical conditions and this would put her in the morbid obesity range. The patient denies any chest pain or shortness of breath. She has no trouble swallowing including heartburn indigestion nausea vomiting diarrhea or blood in the stool. She does have some initial constipation but this has improved with reducing the pain medicine. She is getting physical therapy at home as been excited and please with the therapy she is been receiving. She is passing her water without problems. She does have B12 deficiency and she has not had a B12 shot since the end of March. We will pay particular attention to this and the blood work is done today.     Patient Active Problem List   Diagnosis Date Noted  . OA (osteoarthritis) of knee 02/13/2016  . Leg edema 12/07/2015  . Preop cardiovascular exam 12/07/2015  . B12 deficiency 12/08/2014  . Metabolic syndrome 80/16/5537  . Bradycardia 05/31/2014  . Hyperlipemia 04/28/2013  . Vitamin D deficiency 04/28/2013  . Lumbar spondylosis with myelopathy 04/28/2013  . Osteoarthritis 04/28/2013  . Hypertension 04/28/2013  . Incontinence of urine 04/28/2013  . Hypothyroidism 04/28/2013  . Cough 08/21/2011  . Abnormal CXR 08/21/2011   Outpatient  Encounter Prescriptions as of 03/05/2016  Medication Sig  . atenolol (TENORMIN) 50 MG tablet TAKE 1 TABLET (50 MG TOTAL) BY MOUTH DAILY.  Marland Kitchen docusate sodium (COLACE) 100 MG capsule Take 100 mg by mouth daily.  Marland Kitchen HYDROmorphone (DILAUDID) 2 MG tablet Take 1-2 tablets (2-4 mg total) by mouth every 4 (four) hours as needed for severe pain.  Marland Kitchen levothyroxine (SYNTHROID, LEVOTHROID) 150 MCG tablet Take 1 tablet (150 mcg total) by mouth daily.  . methocarbamol (ROBAXIN) 500 MG tablet Take 1 tablet (500 mg total) by mouth every 6 (six) hours as needed for muscle spasms.  . polyethylene glycol (MIRALAX / GLYCOLAX) packet Take 17 g by mouth daily as needed for mild constipation. Reported on 01/31/2016  . Propylene Glycol (SYSTANE BALANCE OP) Apply 1 drop to eye 2 (two) times daily as needed (Dry eyes). Reported on 01/31/2016  . rosuvastatin (CRESTOR) 40 MG tablet Take 20 mg by mouth every evening.  . triamcinolone cream (KENALOG) 0.1 % Apply 1 application topically 3 (three) times daily. To affected area of right leg.  . [DISCONTINUED] rivaroxaban (XARELTO) 10 MG TABS tablet Take 1 tablet (10 mg total) by mouth daily with breakfast.   No facility-administered encounter medications on file as of 03/05/2016.     Review of Systems  Constitutional: Negative.   HENT: Negative.   Eyes: Negative.   Respiratory: Negative.   Cardiovascular: Negative.   Gastrointestinal: Negative.   Endocrine: Negative.   Genitourinary: Negative.   Musculoskeletal: Negative.   Skin: Negative.  Allergic/Immunologic: Negative.   Neurological: Negative.   Hematological: Negative.   Psychiatric/Behavioral: Negative.        Objective:   Physical Exam  Constitutional: She is oriented to person, place, and time. She appears well-developed and well-nourished. No distress.  HENT:  Head: Normocephalic and atraumatic.  Right Ear: External ear normal.  Left Ear: External ear normal.  Nose: Nose normal.  Mouth/Throat: Oropharynx is  clear and moist.  Eyes: Conjunctivae and EOM are normal. Pupils are equal, round, and reactive to light. Right eye exhibits no discharge. Left eye exhibits no discharge. No scleral icterus.  Neck: Normal range of motion. Neck supple. No thyromegaly present.  No bruits or thyromegaly or adenopathy  Cardiovascular: Normal rate, regular rhythm, normal heart sounds and intact distal pulses.   No murmur heard. Heart has a regular rate and rhythm at 60/m  Pulmonary/Chest: Effort normal and breath sounds normal. No respiratory distress. She has no wheezes. She has no rales. She exhibits no tenderness.  Clear anteriorly and posteriorly  Abdominal: Soft. Bowel sounds are normal. She exhibits no mass. There is no tenderness. There is no rebound and no guarding.  Abdominal obesity without masses tenderness or organ enlargement or bruits  Musculoskeletal: Normal range of motion. She exhibits no edema or tenderness.  Status post right knee replacement. Incision site looks good swelling that is expected no redness or rubor.  Lymphadenopathy:    She has no cervical adenopathy.  Neurological: She is alert and oriented to person, place, and time.  Skin: Skin is warm and dry. No rash noted.  Psychiatric: She has a normal mood and affect. Her behavior is normal. Judgment and thought content normal.  Nursing note and vitals reviewed.   BP 126/62 mmHg  Pulse 52  Temp(Src) 96.6 F (35.9 C) (Oral)  Ht '5\' 3"'  (1.6 m)  Wt 201 lb (91.173 kg)  BMI 35.61 kg/m2  LMP 04/29/1991       Assessment & Plan:  1. Essential hypertension, benign -The blood pressure is good today the patient will continue with current treatment - BMP8+EGFR - CBC with Differential/Platelet - Hepatic function panel  2. Vitamin D deficiency -Continue vitamin D replacement pending results of lab work - CBC with Differential/Platelet - VITAMIN D 25 Hydroxy (Vit-D Deficiency, Fractures)  3. Other specified hypothyroidism -Continue  thyroid replacement pending results of lab work - CBC with Differential/Platelet - Thyroid Panel With TSH  4. Hyperlipemia -Continue current statin drug and aggressive therapeutic lifestyle changes which include diet and exercise - CBC with Differential/Platelet - NMR, lipoprofile  5. Vitamin B12 deficiency -No B12 shot since the end of March and most likely this will have to be restarted but we will wait until lab work is returned - CBC with Differential/Platelet - Vitamin B12  6. History of arthroplasty of right knee -Continue to follow-up with orthopedic surgeon and with physical therapy as he has recommended  7. Primary osteoarthritis of both knees -Continue physical therapy  8. B12 deficiency -Check B12 level and wait till results are back  9. Essential hypertension -Blood pressure is good today and she will continue with current treatment  10. Primary osteoarthritis involving multiple joints  11. Morbid obesity due to excess calories ([place]) -Hopefully as the knee improved she can get more exercise and work more aggressively with her diet with more weight loss  No orders of the defined types were placed in this encounter.   Patient Instructions  Medicare Annual Wellness Visit  [place] and the medical providers at [facility] strive to bring you the best medical care.  In doing so we not only want to address your current medical conditions and concerns but also to detect new conditions early and prevent illness, disease and health-related problems.    Medicare offers a yearly Wellness Visit which allows our clinical staff to assess your need for preventative services including immunizations, lifestyle education, counseling to decrease risk of preventable diseases and screening for fall risk and other medical concerns.    This visit is provided free of charge (no copay) for all Medicare recipients. The clinical pharmacists at  [facility] have begun to conduct these Wellness Visits which will also include a thorough review of all your medications.    As you primary medical provider recommend that you make an appointment for your Annual Wellness Visit if you have not done so already this year.  You may set up this appointment before you leave today or you may call back [phone]) and schedule an appointment.  Please make sure when you call that you mention that you are scheduling your Annual Wellness Visit with the clinical pharmacist so that the appointment may be made for the proper length of time.     Continue current medications. Continue good therapeutic lifestyle changes which include good diet and exercise. Fall precautions discussed with patient. If an FOBT was given today- please return it to our front desk. If you are over 71 years old - you may need Prevnar 25 or the adult Pneumonia vaccine.  **Flu shots are available--- please call and schedule a FLU-CLINIC appointment**  After your visit with [place] today you will receive a survey in the mail or online from [employer] regarding your care with [place]. Please take a moment to fill this out. Your feedback is very important to [place] as you can help [place] better understand your patient needs as well as improve your experience and satisfaction. WE CARE ABOUT YOU!!!   Continue physical therapy Continue follow-up with orthopedic surgeon as planned We will call with results of lab work as soon as those results become available and most likely we will reinitiate your B12 injections. Continue to drink plenty of fluids and stay well hydrated   Arrie Senate MD

## 2016-03-06 ENCOUNTER — Encounter: Payer: Self-pay | Admitting: Physical Therapy

## 2016-03-06 ENCOUNTER — Ambulatory Visit: Payer: BLUE CROSS/BLUE SHIELD | Admitting: Physical Therapy

## 2016-03-06 DIAGNOSIS — M25661 Stiffness of right knee, not elsewhere classified: Secondary | ICD-10-CM

## 2016-03-06 DIAGNOSIS — R601 Generalized edema: Secondary | ICD-10-CM

## 2016-03-06 DIAGNOSIS — M25561 Pain in right knee: Secondary | ICD-10-CM

## 2016-03-06 LAB — NMR, LIPOPROFILE
Cholesterol: 168 mg/dL (ref 100–199)
HDL Cholesterol by NMR: 47 mg/dL (ref >39)
HDL Particle Number: 29 umol/L — ABNORMAL LOW (ref >=30.5)
LDL PARTICLE NUMBER: 1435 nmol/L — AB (ref <1000)
LDL SIZE: 21.2 nm (ref >20.5)
LDL-C: 100 mg/dL — ABNORMAL HIGH (ref 0–99)
LP-IR Score: 31 (ref <=45)
Small LDL Particle Number: 627 nmol/L — ABNORMAL HIGH (ref <=527)
TRIGLYCERIDES BY NMR: 103 mg/dL (ref 0–149)

## 2016-03-06 LAB — CBC WITH DIFFERENTIAL/PLATELET
BASOS: 1 %
Basophils Absolute: 0 10*3/uL (ref 0.0–0.2)
EOS (ABSOLUTE): 0.1 10*3/uL (ref 0.0–0.4)
Eos: 4 %
Hematocrit: 35.3 % (ref 34.0–46.6)
Hemoglobin: 11.6 g/dL (ref 11.1–15.9)
IMMATURE GRANS (ABS): 0 10*3/uL (ref 0.0–0.1)
Immature Granulocytes: 0 %
Lymphocytes Absolute: 1 10*3/uL (ref 0.7–3.1)
Lymphs: 24 %
MCH: 27.9 pg (ref 26.6–33.0)
MCHC: 32.9 g/dL (ref 31.5–35.7)
MCV: 85 fL (ref 79–97)
MONOS ABS: 0.5 10*3/uL (ref 0.1–0.9)
Monocytes: 14 %
NEUTROS ABS: 2.3 10*3/uL (ref 1.4–7.0)
Neutrophils: 57 %
PLATELETS: 348 10*3/uL (ref 150–379)
RBC: 4.16 x10E6/uL (ref 3.77–5.28)
RDW: 14.2 % (ref 12.3–15.4)
WBC: 3.9 10*3/uL (ref 3.4–10.8)

## 2016-03-06 LAB — THYROID PANEL WITH TSH
FREE THYROXINE INDEX: 3.6 (ref 1.2–4.9)
T3 Uptake Ratio: 26 % (ref 24–39)
T4, Total: 13.8 ug/dL — ABNORMAL HIGH (ref 4.5–12.0)
TSH: 0.274 u[IU]/mL — AB (ref 0.450–4.500)

## 2016-03-06 LAB — BMP8+EGFR
BUN/Creatinine Ratio: 11 — ABNORMAL LOW (ref 12–28)
BUN: 9 mg/dL (ref 8–27)
CHLORIDE: 97 mmol/L (ref 96–106)
CO2: 23 mmol/L (ref 18–29)
Calcium: 9.3 mg/dL (ref 8.7–10.3)
Creatinine, Ser: 0.84 mg/dL (ref 0.57–1.00)
GFR calc Af Amer: 84 mL/min/{1.73_m2} (ref >59)
GFR calc non Af Amer: 73 mL/min/{1.73_m2} (ref >59)
GLUCOSE: 88 mg/dL (ref 65–99)
POTASSIUM: 4.4 mmol/L (ref 3.5–5.2)
Sodium: 135 mmol/L (ref 134–144)

## 2016-03-06 LAB — HEPATIC FUNCTION PANEL
ALT: 21 IU/L (ref 0–32)
AST: 32 IU/L (ref 0–40)
Albumin: 4.2 g/dL (ref 3.6–4.8)
Alkaline Phosphatase: 261 IU/L — ABNORMAL HIGH (ref 39–117)
BILIRUBIN, DIRECT: 0.32 mg/dL (ref 0.00–0.40)
Bilirubin Total: 0.7 mg/dL (ref 0.0–1.2)
Total Protein: 6.5 g/dL (ref 6.0–8.5)

## 2016-03-06 LAB — VITAMIN B12: VITAMIN B 12: 1300 pg/mL — AB (ref 211–946)

## 2016-03-06 LAB — VITAMIN D 25 HYDROXY (VIT D DEFICIENCY, FRACTURES): VIT D 25 HYDROXY: 39 ng/mL (ref 30.0–100.0)

## 2016-03-06 NOTE — Therapy (Signed)
[facility] [place], [place], [address] Phone: [phone]   Fax:  [phone]  Physical Therapy Treatment  Patient Details  Name: Miranda Wilson MRN: [id number] Date of Birth: [birth year] Referring Provider: Gorden Harms, MD  Encounter Date: 03/06/2016      PT End of Session - 03/06/16 1030    Visit Number 4   Number of Visits 18   Date for PT Re-Evaluation 04/09/16   PT Start Time [id number]   PT Stop Time 1121   PT Time Calculation (min) 49 min   Activity Tolerance Patient tolerated treatment well   Behavior During Therapy [facility] for tasks assessed/performed      Past Medical History  Diagnosis Date  . Hyperlipidemia   . Hypertension   . IBS (irritable bowel syndrome)   . Varicose veins   . PMB (postmenopausal bleeding)   . History of idiopathic seizure     AGE 65 to 65  X5  --  UNKNOW IDIOLOGY , PER PT WAS ANEMIC AT THE TIME--  NONE SINCE  . S/P radioactive iodine thyroid ablation   . History of tachycardia     S/P  RADIOACTIVE IODINE ABLATION OF THYROID  . History of colon polyps   . Cervical spondylosis   . OA (osteoarthritis)     shoulders  left > right  . GERD (gastroesophageal reflux disease)   . Chronic constipation   . Urge urinary incontinence   . Bilateral edema of lower extremity     left > right --  wear compression hose  . Hypothyroidism, postradioiodine therapy     AGE 65  . At risk for sleep apnea   . Vein disorder     LEFT ANKLE VEIN VALVE REFLUX WITH DECREASED CIRCULATION     Past Surgical History  Procedure Laterality Date  . Varicose vein surgery  1998 to 2010    includes laser and phlebectomies  . Cesarean section  1979  . Endovenous ablation saphenous vein w/ laser  04/2007  . Tonsillectomy  1955  . Colonoscopy w/ polypectomy  10-25-2008  . Total knee arthroplasty Left 12-19-2009  . Closed left knee manipulation and right knee cortisone injection  02-20-2010  . Left knee arthrotomy  w/ lysis adhesions  12-01-2010  . Laparoscopic cholecystectomy  1993    and BILATERAL TUBAL LIGATION  . Cataract extraction w/ intraocular lens  implant, bilateral  right 2011//  left 2013  . Hysteroscopy w/d&c N/A 11/11/2014    Procedure: DILATATION AND CURETTAGE /HYSTEROSCOPY;  Surgeon: Gus Height, MD;  [facility];  Service: Gynecology;  Laterality: N/A;  . Eye surgery    . Dilation and curettage of uterus    . Joint replacement    . Total knee arthroplasty Right 02/13/2016    Procedure: RIGHT TOTAL KNEE ARTHROPLASTY;  Surgeon: Gaynelle Arabian, MD;  Location: [facility];  Service: Orthopedics;  Laterality: Right;    There were no vitals filed for this visit.      Subjective Assessment - 03/06/16 1030    Subjective Reports that her knee is okay but damp weather makes her knee stiff.   Pertinent History HTN, venous insuffieciency, back problems, disc problems, L TKR 2011   Patient Stated Goals walk and stand with good balance/comfort; regain ROM   Currently in Pain? Yes   Pain Score 3    Pain Location Knee   Pain Orientation Right   Pain Descriptors / Indicators Sore   Pain Type Surgical  pain   Pain Onset 1 to 4 weeks ago            [facility] PT Assessment - 03/06/16 0001    Assessment   Medical Diagnosis S/P R TKR   Onset Date/Surgical Date 02/13/16   Next MD Visit 03/20/2016   Precautions   Precautions Knee   Observation/Other Assessments-Edema    Edema Circumferential   Circumferential Edema   Circumferential - Right 45.5 cm   Circumferential - Left  43.4 cm   ROM / Strength   AROM / PROM / Strength AROM   AROM   Overall AROM  Deficits   AROM Assessment Site Knee   Right/Left Knee Right   Right Knee Flexion 98                     [facility] Adult PT Treatment/Exercise - 03/06/16 0001    Knee/Hip Exercises: Stretches   Active Hamstring Stretch Both;3 reps;30 seconds   Knee/Hip Exercises: Aerobic   Nustep L4 x12 min   Knee/Hip  Exercises: Standing   Forward Lunges Right;2 sets;10 reps;3 seconds  6" step   Rocker Board 3 minutes   Knee/Hip Exercises: Supine   Straight Leg Raises Strengthening;Right;2 sets;10 reps   Modalities   Modalities Designer, multimedia Location R knee   Electrical Stimulation Action IFC   Electrical Stimulation Parameters 1-10 Hz x15 min   Electrical Stimulation Goals Edema;Pain   Vasopneumatic   Number Minutes Vasopneumatic  15 minutes   Vasopnuematic Location  Knee   Vasopneumatic Pressure Low   Vasopneumatic Temperature  34   Manual Therapy   Manual Therapy Passive ROM   Passive ROM PROM of R knee into flexion in supine to increase R knee flexion                  PT Short Term Goals - 03/06/16 1114    PT SHORT TERM GOAL #1   Title Decreased edema in R knee to within 1.5 cm of L   Time 3   Period Weeks   Status On-going  2.1 cm difference R>L 03/06/2016           PT Long Term Goals - 03/06/16 1115    PT LONG TERM GOAL #1   Title I with HEP   Time 6   Period Weeks   Status On-going   PT LONG TERM GOAL #2   Title Improved R knee ROM -5 to 120 degrees to improve function   Time 6   Period Weeks   Status New   PT LONG TERM GOAL #3   Title Patient able to amb 300 feet safely without AD   Time 6   Period Weeks   Status On-going   PT LONG TERM GOAL #4   Title Able to climb stairs with reciprocal gait and 5/5 knee strength   Time 6   Period Weeks   Status On-going               Plan - 03/06/16 1108    Clinical Impression Statement Patient tolerated today's treatment well and showed improved regarding R knee flexion. Completed all exercises well with minimal multimodal cueing for proper exercise technique. Patient was encouraged to initiate forward lunges at home at a safe location as well as to continue [place]. AROM R knee flexion was measured as 98 deg in supine today.  Circumferential edema measurement concluded 2.1 cm difference with  R>L. Normal modalities response noted following removal of the modalities. Patient denied pain following today's treatment.   Rehab Potential Excellent   PT Frequency 3x / week   PT Duration 6 weeks   PT Treatment/Interventions ADLs/Self Care Home Management;Electrical Stimulation;Cryotherapy;Therapeutic exercise;Manual techniques;Vasopneumatic Device;Passive range of motion;Patient/family education;Gait training;Stair training;Balance training;Neuromuscular re-education;Scar mobilization   PT Next Visit Plan TKR protocol; gait training, edema management; modalities for pain/edema.      Patient will benefit from skilled therapeutic intervention in order to improve the following deficits and impairments:  Abnormal gait, Decreased range of motion, Pain, Decreased scar mobility, Decreased strength, Increased edema, Decreased mobility  Visit Diagnosis: Pain in right knee  Stiffness of right knee, not elsewhere classified  Generalized edema     Problem List Patient Active Problem List   Diagnosis Date Noted  . History of arthroplasty of right knee 03/05/2016  . OA (osteoarthritis) of knee 02/13/2016  . Leg edema 12/07/2015  . Preop cardiovascular exam 12/07/2015  . B12 deficiency 12/08/2014  . Metabolic syndrome [id number]  . Bradycardia 05/31/2014  . Hyperlipemia 04/28/2013  . Vitamin D deficiency 04/28/2013  . Lumbar spondylosis with myelopathy 04/28/2013  . Osteoarthritis 04/28/2013  . Hypertension 04/28/2013  . Incontinence of urine 04/28/2013  . Hypothyroidism 04/28/2013  . Cough 08/21/2011  . Abnormal CXR 08/21/2011    Wynelle Fanny, PTA 03/06/2016, 11:23 AM  [facility] [address] [place], [place], [address] Phone: [phone]   Fax:  [phone]  Name: GENYSIS BALLAS MRN: [id number] Date of Birth: Aug 11, 1951

## 2016-03-07 ENCOUNTER — Encounter: Payer: Self-pay | Admitting: Physical Therapy

## 2016-03-07 ENCOUNTER — Ambulatory Visit: Payer: BLUE CROSS/BLUE SHIELD | Admitting: Physical Therapy

## 2016-03-07 DIAGNOSIS — R601 Generalized edema: Secondary | ICD-10-CM

## 2016-03-07 DIAGNOSIS — M25661 Stiffness of right knee, not elsewhere classified: Secondary | ICD-10-CM

## 2016-03-07 DIAGNOSIS — M25561 Pain in right knee: Secondary | ICD-10-CM

## 2016-03-07 NOTE — Therapy (Signed)
[facility] [place], [place], [address] Phone: [phone]   Fax:  [phone]  Physical Therapy Treatment  Patient Details  Name: Miranda Wilson MRN: [id number] Date of Birth: Jun 13, 1951 Referring Provider: Gorden Harms, MD  Encounter Date: 03/07/2016      PT End of Session - 03/07/16 1030    Visit Number 5   Number of Visits 18   Date for PT Re-Evaluation 04/09/16   PT Start Time [id number]   PT Stop Time 1123   PT Time Calculation (min) 54 min   Activity Tolerance Patient tolerated treatment well   Behavior During Therapy [facility] for tasks assessed/performed      Past Medical History  Diagnosis Date  . Hyperlipidemia   . Hypertension   . IBS (irritable bowel syndrome)   . Varicose veins   . PMB (postmenopausal bleeding)   . History of idiopathic seizure     AGE 65 to 20  X5  --  UNKNOW IDIOLOGY , PER PT WAS ANEMIC AT THE TIME--  NONE SINCE  . S/P radioactive iodine thyroid ablation   . History of tachycardia     S/P  RADIOACTIVE IODINE ABLATION OF THYROID  . History of colon polyps   . Cervical spondylosis   . OA (osteoarthritis)     shoulders  left > right  . GERD (gastroesophageal reflux disease)   . Chronic constipation   . Urge urinary incontinence   . Bilateral edema of lower extremity     left > right --  wear compression hose  . Hypothyroidism, postradioiodine therapy     AGE 25  . At risk for sleep apnea   . Vein disorder     LEFT ANKLE VEIN VALVE REFLUX WITH DECREASED CIRCULATION     Past Surgical History  Procedure Laterality Date  . Varicose vein surgery  1998 to 2010    includes laser and phlebectomies  . Cesarean section  1979  . Endovenous ablation saphenous vein w/ laser  04/2007  . Tonsillectomy  1955  . Colonoscopy w/ polypectomy  10-25-2008  . Total knee arthroplasty Left 12-19-2009  . Closed left knee manipulation and right knee cortisone injection  02-20-2010  . Left knee  arthrotomy w/ lysis adhesions  12-01-2010  . Laparoscopic cholecystectomy  1993    and BILATERAL TUBAL LIGATION  . Cataract extraction w/ intraocular lens  implant, bilateral  right 2011//  left 2013  . Hysteroscopy w/d&c N/A 11/11/2014    Procedure: DILATATION AND CURETTAGE /HYSTEROSCOPY;  Surgeon: Gus Height, MD;  Location: [facility];  Service: Gynecology;  Laterality: N/A;  . Eye surgery    . Dilation and curettage of uterus    . Joint replacement    . Total knee arthroplasty Right 02/13/2016    Procedure: RIGHT TOTAL KNEE ARTHROPLASTY;  Surgeon: Gaynelle Arabian, MD;  Location: [facility];  Service: Orthopedics;  Laterality: Right;    There were no vitals filed for this visit.      Subjective Assessment - 03/07/16 1029    Subjective Reports that her knee is sore today.   Pertinent History HTN, venous insuffieciency, back problems, disc problems, L TKR 2011   Patient Stated Goals walk and stand with good balance/comfort; regain ROM   Currently in Pain? Yes   Pain Score 3    Pain Location Knee   Pain Orientation Right   Pain Descriptors / Indicators Sore   Pain Type Surgical pain   Pain Onset 1  to 4 weeks ago            [facility] PT Assessment - 03/07/16 0001    Assessment   Medical Diagnosis S/P R TKR   Onset Date/Surgical Date 02/13/16   Next MD Visit 03/20/2016   Precautions   Precautions Knee                     [facility] Adult PT Treatment/Exercise - 03/07/16 0001    Knee/Hip Exercises: Stretches   Active Hamstring Stretch Both;3 reps;30 seconds   Knee/Hip Exercises: Aerobic   Nustep L6, seat 11-9 x13 min   Knee/Hip Exercises: Standing   Forward Lunges Right;2 sets;10 reps;3 seconds  6" step   Rocker Board 3 minutes   Knee/Hip Exercises: Seated   Long Arc Quad Strengthening;Right;2 sets;10 reps;Weights   Long Arc Quad Weight 3 lbs.   Hamstring Curl Strengthening;Right;2 sets;10 reps;Weights   Hamstring Limitations 3   Modalities    Modalities Designer, multimedia Location R knee   Electrical Stimulation Action IFC   Electrical Stimulation Parameters 1-10 hz x15 min   Electrical Stimulation Goals Edema;Pain   Vasopneumatic   Number Minutes Vasopneumatic  15 minutes   Vasopnuematic Location  Knee   Vasopneumatic Pressure Low   Vasopneumatic Temperature  34   Manual Therapy   Manual Therapy Passive ROM   Passive ROM PROM of R knee into flexion/ext in supine to increase R knee ROM                  PT Short Term Goals - 03/06/16 1114    PT SHORT TERM GOAL #1   Title Decreased edema in R knee to within 1.5 cm of L   Time 3   Period Weeks   Status On-going  2.1 cm difference R>L 03/06/2016           PT Long Term Goals - 03/06/16 1115    PT LONG TERM GOAL #1   Title I with HEP   Time 6   Period Weeks   Status On-going   PT LONG TERM GOAL #2   Title Improved R knee ROM -5 to 120 degrees to improve function   Time 6   Period Weeks   Status New   PT LONG TERM GOAL #3   Title Patient able to amb 300 feet safely without AD   Time 6   Period Weeks   Status On-going   PT LONG TERM GOAL #4   Title Able to climb stairs with reciprocal gait and 5/5 knee strength   Time 6   Period Weeks   Status On-going               Plan - 03/07/16 1110    Clinical Impression Statement Patient tolerated today's treatment well and able to complete exercises without report of increased pain. Patient experienced discomfort with PROM of R knee in both flexion and extension and required short rest breaks to allow for relaxation. Patient noted that she experiences discomfort in medioposterior knee at distal medial HS and superior gastroc. Normal modalites response noted following removal of the modalities. Goals remain on-going secondary to lack of R knee ROM, increased edema, decreased strength, and use of AD. Patient denied pain following today's  treatment.   Rehab Potential Excellent   PT Frequency 3x / week   PT Duration 6 weeks   PT Treatment/Interventions ADLs/Self Care Home Management;Electrical Stimulation;Cryotherapy;Therapeutic exercise;Manual techniques;Vasopneumatic  Device;Passive range of motion;Patient/family education;Gait training;Stair training;Balance training;Neuromuscular re-education;Scar mobilization   PT Next Visit Plan Continue per TKR protocol with modalities PRN for pain. Assess R knee ROM next treatment.   Consulted and Agree with Plan of Care Patient      Patient will benefit from skilled therapeutic intervention in order to improve the following deficits and impairments:  Abnormal gait, Decreased range of motion, Pain, Decreased scar mobility, Decreased strength, Increased edema, Decreased mobility  Visit Diagnosis: Pain in right knee  Stiffness of right knee, not elsewhere classified  Generalized edema     Problem List Patient Active Problem List   Diagnosis Date Noted  . History of arthroplasty of right knee 03/05/2016  . OA (osteoarthritis) of knee 02/13/2016  . Leg edema 12/07/2015  . Preop cardiovascular exam 12/07/2015  . B12 deficiency 12/08/2014  . Metabolic syndrome [id number]  . Bradycardia 05/31/2014  . Hyperlipemia 04/28/2013  . Vitamin D deficiency 04/28/2013  . Lumbar spondylosis with myelopathy 04/28/2013  . Osteoarthritis 04/28/2013  . Hypertension 04/28/2013  . Incontinence of urine 04/28/2013  . Hypothyroidism 04/28/2013  . Cough 08/21/2011  . Abnormal CXR 08/21/2011    Wynelle Fanny, PTA 03/07/2016, 11:33 AM  [facility] [address] [place], [place], [address] Phone: [phone]   Fax:  [phone]  Name: GEORGIAN MATHEW MRN: [id number] Date of Birth: 1951-10-22

## 2016-03-09 ENCOUNTER — Ambulatory Visit: Payer: BLUE CROSS/BLUE SHIELD | Admitting: *Deleted

## 2016-03-09 DIAGNOSIS — M25561 Pain in right knee: Secondary | ICD-10-CM

## 2016-03-09 DIAGNOSIS — M25661 Stiffness of right knee, not elsewhere classified: Secondary | ICD-10-CM

## 2016-03-09 DIAGNOSIS — R601 Generalized edema: Secondary | ICD-10-CM

## 2016-03-09 NOTE — Therapy (Signed)
[facility] [place], [place], [address] Phone: [phone]   Fax:  [phone]  Physical Therapy Treatment  Patient Details  Name: Miranda Wilson MRN: [id number] Date of Birth: 1951-06-01 Referring Provider: Gorden Harms, MD  Encounter Date: 03/09/2016      PT End of Session - 03/09/16 1039    Visit Number 6   Number of Visits 18   Date for PT Re-Evaluation 04/09/16   PT Start Time 1030   PT Stop Time 1129   PT Time Calculation (min) 59 min      Past Medical History  Diagnosis Date  . Hyperlipidemia   . Hypertension   . IBS (irritable bowel syndrome)   . Varicose veins   . PMB (postmenopausal bleeding)   . History of idiopathic seizure     AGE 65 to 20  X5  --  UNKNOW IDIOLOGY , PER PT WAS ANEMIC AT THE TIME--  NONE SINCE  . S/P radioactive iodine thyroid ablation   . History of tachycardia     S/P  RADIOACTIVE IODINE ABLATION OF THYROID  . History of colon polyps   . Cervical spondylosis   . OA (osteoarthritis)     shoulders  left > right  . GERD (gastroesophageal reflux disease)   . Chronic constipation   . Urge urinary incontinence   . Bilateral edema of lower extremity     left > right --  wear compression hose  . Hypothyroidism, postradioiodine therapy     AGE 44  . At risk for sleep apnea   . Vein disorder     LEFT ANKLE VEIN VALVE REFLUX WITH DECREASED CIRCULATION     Past Surgical History  Procedure Laterality Date  . Varicose vein surgery  1998 to 2010    includes laser and phlebectomies  . Cesarean section  1979  . Endovenous ablation saphenous vein w/ laser  04/2007  . Tonsillectomy  1955  . Colonoscopy w/ polypectomy  10-25-2008  . Total knee arthroplasty Left 12-19-2009  . Closed left knee manipulation and right knee cortisone injection  02-20-2010  . Left knee arthrotomy w/ lysis adhesions  12-01-2010  . Laparoscopic cholecystectomy  1993    and BILATERAL TUBAL LIGATION  .  Cataract extraction w/ intraocular lens  implant, bilateral  right 2011//  left 2013  . Hysteroscopy w/d&c N/A 11/11/2014    Procedure: DILATATION AND CURETTAGE /HYSTEROSCOPY;  Surgeon: Gus Height, MD;  Location: [facility];  Service: Gynecology;  Laterality: N/A;  . Eye surgery    . Dilation and curettage of uterus    . Joint replacement    . Total knee arthroplasty Right 02/13/2016    Procedure: RIGHT TOTAL KNEE ARTHROPLASTY;  Surgeon: Gaynelle Arabian, MD;  Location: [facility];  Service: Orthopedics;  Laterality: Right;    There were no vitals filed for this visit.      Subjective Assessment - 03/09/16 1038    Subjective Reports that her knee is sore today.   Pertinent History HTN, venous insuffieciency, back problems, disc problems, L TKR 2011   Patient Stated Goals walk and stand with good balance/comfort; regain ROM   Currently in Pain? Yes   Pain Score 4    Pain Location Knee   Pain Orientation Right   Pain Descriptors / Indicators Sore   Pain Type Surgical pain   Pain Onset 1 to 4 weeks ago   Pain Frequency Intermittent  [facility] Adult PT Treatment/Exercise - 03/09/16 0001    Knee/Hip Exercises: Aerobic   Nustep L6, seat 11-9 x15 min   Knee/Hip Exercises: Standing   Forward Lunges Right;2 sets;10 reps;3 seconds  6" step x 10 and 8 in x 10   Forward Step Up Right;3 sets;10 reps;Step Height: 6"   Rocker Board 5 minutes  calf stretching   Knee/Hip Exercises: Seated   Long Arc Quad Strengthening;Right;2 sets;10 reps;Weights   Long Arc Quad Weight 3 lbs.   Hamstring Curl --   Hamstring Limitations --   Modalities   Modalities Designer, multimedia Location R knee   Electrical Stimulation Action IFC   Electrical Stimulation Parameters 1-10hz  x15 mins   Electrical Stimulation Goals Edema;Pain   Vasopneumatic   Number Minutes Vasopneumatic  15 minutes    Vasopnuematic Location  Knee   Vasopneumatic Pressure Low   Vasopneumatic Temperature  34   Manual Therapy   Manual Therapy Passive ROM   Soft tissue mobilization STW to R knee incision to proper proper mobility; STW to distal Quad to decrease tightness   Passive ROM PROM of R knee into flexion/ext in supine to increase R knee ROM  Patella mobs                  PT Short Term Goals - 03/06/16 1114    PT SHORT TERM GOAL #1   Title Decreased edema in R knee to within 1.5 cm of L   Time 3   Period Weeks   Status On-going  2.1 cm difference R>L 03/06/2016           PT Long Term Goals - 03/06/16 1115    PT LONG TERM GOAL #1   Title I with HEP   Time 6   Period Weeks   Status On-going   PT LONG TERM GOAL #2   Title Improved R knee ROM -5 to 120 degrees to improve function   Time 6   Period Weeks   Status New   PT LONG TERM GOAL #3   Title Patient able to amb 300 feet safely without AD   Time 6   Period Weeks   Status On-going   PT LONG TERM GOAL #4   Title Able to climb stairs with reciprocal gait and 5/5 knee strength   Time 6   Period Weeks   Status On-going               Plan - 03/09/16 1052    Rehab Potential Excellent   PT Frequency 3x / week   PT Duration 6 weeks   PT Treatment/Interventions ADLs/Self Care Home Management;Electrical Stimulation;Cryotherapy;Therapeutic exercise;Manual techniques;Vasopneumatic Device;Passive range of motion;Patient/family education;Gait training;Stair training;Balance training;Neuromuscular re-education;Scar mobilization   PT Next Visit Plan Continue per TKR protocol with modalities PRN for pain. Assess R knee ROM next treatment.   Consulted and Agree with Plan of Care Patient      Patient will benefit from skilled therapeutic intervention in order to improve the following deficits and impairments:  Abnormal gait, Decreased range of motion, Pain, Decreased scar mobility, Decreased strength, Increased edema,  Decreased mobility  Visit Diagnosis: Pain in right knee  Stiffness of right knee, not elsewhere classified  Generalized edema     Problem List Patient Active Problem List   Diagnosis Date Noted  . History of arthroplasty of right knee 03/05/2016  . OA (osteoarthritis) of knee 02/13/2016  . Leg edema 12/07/2015  .  Preop cardiovascular exam 12/07/2015  . B12 deficiency 12/08/2014  . Metabolic syndrome [id number]  . Bradycardia 05/31/2014  . Hyperlipemia 04/28/2013  . Vitamin D deficiency 04/28/2013  . Lumbar spondylosis with myelopathy 04/28/2013  . Osteoarthritis 04/28/2013  . Hypertension 04/28/2013  . Incontinence of urine 04/28/2013  . Hypothyroidism 04/28/2013  . Cough 08/21/2011  . Abnormal CXR 08/21/2011    Shilah Hefel,CHRIS, PTA 03/09/2016, 12:44 PM  [facility] [address] [place], [place], [address] Phone: [phone]   Fax:  [phone]  Name: HATSUYE KNICKREHM MRN: [id number] Date of Birth: Mar 08, 1951

## 2016-03-12 ENCOUNTER — Encounter: Payer: Self-pay | Admitting: Physical Therapy

## 2016-03-12 ENCOUNTER — Ambulatory Visit: Payer: BLUE CROSS/BLUE SHIELD | Admitting: Physical Therapy

## 2016-03-12 DIAGNOSIS — M25561 Pain in right knee: Secondary | ICD-10-CM

## 2016-03-12 DIAGNOSIS — R601 Generalized edema: Secondary | ICD-10-CM

## 2016-03-12 DIAGNOSIS — M25661 Stiffness of right knee, not elsewhere classified: Secondary | ICD-10-CM

## 2016-03-12 NOTE — Patient Instructions (Signed)
Knee Extension Mobilization: Towel Prop   With rolled towel under right ankle, place _1-5___ pound weight across knee. Hold __5+__ minutes. Repeat __2-3__ times per set. Do __2__ sets per session. Do __2-4__ sessions per day.    Knee Flexion Stretch on Step  Place foot on step and lean forward until you feel a good stretch in front of knee.   hold 30 sec x 5-10 perform 2-4 x daily    

## 2016-03-12 NOTE — Therapy (Signed)
[facility] [place], [place], [address] Phone: [phone]   Fax:  [phone]  Physical Therapy Treatment  Patient Details  Name: Miranda Wilson MRN: [id number] Date of Birth: [birth year] Referring Provider: Gorden Harms, MD  Encounter Date: 03/12/2016      PT End of Session - 03/12/16 1118    Visit Number 7   Number of Visits 18   Date for PT Re-Evaluation 04/09/16   PT Start Time 1030   PT Stop Time 1131   PT Time Calculation (min) 61 min   Activity Tolerance Patient tolerated treatment well   Behavior During Therapy [facility] for tasks assessed/performed      Past Medical History  Diagnosis Date  . Hyperlipidemia   . Hypertension   . IBS (irritable bowel syndrome)   . Varicose veins   . PMB (postmenopausal bleeding)   . History of idiopathic seizure     AGE 65 to 65  X5  --  UNKNOW IDIOLOGY , PER PT WAS ANEMIC AT THE TIME--  NONE SINCE  . S/P radioactive iodine thyroid ablation   . History of tachycardia     S/P  RADIOACTIVE IODINE ABLATION OF THYROID  . History of colon polyps   . Cervical spondylosis   . OA (osteoarthritis)     shoulders  left > right  . GERD (gastroesophageal reflux disease)   . Chronic constipation   . Urge urinary incontinence   . Bilateral edema of lower extremity     left > right --  wear compression hose  . Hypothyroidism, postradioiodine therapy     AGE 65  . At risk for sleep apnea   . Vein disorder     LEFT ANKLE VEIN VALVE REFLUX WITH DECREASED CIRCULATION     Past Surgical History  Procedure Laterality Date  . Varicose vein surgery  1998 to 2010    includes laser and phlebectomies  . Cesarean section  1979  . Endovenous ablation saphenous vein w/ laser  04/2007  . Tonsillectomy  1955  . Colonoscopy w/ polypectomy  10-25-2008  . Total knee arthroplasty Left 12-19-2009  . Closed left knee manipulation and right knee cortisone injection  02-20-2010  . Left knee  arthrotomy w/ lysis adhesions  12-01-2010  . Laparoscopic cholecystectomy  1993    and BILATERAL TUBAL LIGATION  . Cataract extraction w/ intraocular lens  implant, bilateral  right 2011//  left 2013  . Hysteroscopy w/d&c N/A 11/11/2014    Procedure: DILATATION AND CURETTAGE /HYSTEROSCOPY;  Surgeon: Gus Height, MD;  Location: [facility];  Service: Gynecology;  Laterality: N/A;  . Eye surgery    . Dilation and curettage of uterus    . Joint replacement    . Total knee arthroplasty Right 02/13/2016    Procedure: RIGHT TOTAL KNEE ARTHROPLASTY;  Surgeon: Gaynelle Arabian, MD;  Location: [facility];  Service: Orthopedics;  Laterality: Right;    There were no vitals filed for this visit.      Subjective Assessment - 03/12/16 1041    Subjective Patient reports some ongoing soreness and stiff knee   Pertinent History HTN, venous insuffieciency, back problems, disc problems, L TKR 2011   Patient Stated Goals walk and stand with good balance/comfort; regain ROM   Currently in Pain? Yes   Pain Score 4    Pain Location Knee   Pain Orientation Right   Pain Descriptors / Indicators Sore   Pain Type Surgical pain   Pain Onset  1 to 4 weeks ago   Pain Frequency Intermittent   Aggravating Factors  ROM and movement            [facility] PT Assessment - 03/12/16 0001    AROM   Overall AROM  Deficits   AROM Assessment Site Knee   Right/Left Knee Right   Right Knee Extension -15   Right Knee Flexion 90   PROM   Right Knee Extension -10   Right Knee Flexion 103                     [facility] Adult PT Treatment/Exercise - 03/12/16 0001    Knee/Hip Exercises: Aerobic   Nustep 72min L5 for ROM   Knee/Hip Exercises: Standing   Forward Step Up Right;3 sets;10 reps;Step Height: 6"   Rocker Board 3 minutes   Acupuncturist Location R knee   Electrical Stimulation Action IFC   Electrical Stimulation Parameters 1-10hz    Electrical Stimulation Goals  Edema;Pain   Vasopneumatic   Number Minutes Vasopneumatic  15 minutes   Vasopnuematic Location  Knee   Vasopneumatic Pressure Low   Manual Therapy   Manual Therapy Passive ROM   Passive ROM PROM of R knee into flexion/ext in supine to increase R knee ROM  Patella mobs                PT Education - 03/12/16 1104    Education Details HEP ROM   Person(s) Educated Patient   Methods Explanation;Demonstration;Handout   Comprehension Verbalized understanding;Returned demonstration          PT Short Term Goals - 03/06/16 1114    PT SHORT TERM GOAL #1   Title Decreased edema in R knee to within 1.5 cm of L   Time 3   Period Weeks   Status On-going  2.1 cm difference R>L 03/06/2016           PT Long Term Goals - 03/06/16 1115    PT LONG TERM GOAL #1   Title I with HEP   Time 6   Period Weeks   Status On-going   PT LONG TERM GOAL #2   Title Improved R knee ROM -5 to 120 degrees to improve function   Time 6   Period Weeks   Status New   PT LONG TERM GOAL #3   Title Patient able to amb 300 feet safely without AD   Time 6   Period Weeks   Status On-going   PT LONG TERM GOAL #4   Title Able to climb stairs with reciprocal gait and 5/5 knee strength   Time 6   Period Weeks   Status On-going               Plan - 03/12/16 1119    Clinical Impression Statement patient progressing slowly with ROM today due to edema and ROM restrictions. Patient was given HEP today for self stretches for both flex and ext. No improvement with ext ROM today. Educated patient on elevation and active quad sets to help decrease edema. Goals ongoing due to strength, ROM, edema and pain deficits.   Rehab Potential Excellent   PT Frequency 3x / week   PT Duration 6 weeks   PT Treatment/Interventions ADLs/Self Care Home Management;Electrical Stimulation;Cryotherapy;Therapeutic exercise;Manual techniques;Vasopneumatic Device;Passive range of motion;Patient/family education;Gait  training;Stair training;Balance training;Neuromuscular re-education;Scar mobilization   PT Next Visit Plan Continue per TKR protocol with modalities PRN for pain. Assess R knee ROM  next treatment.   Consulted and Agree with Plan of Care Patient      Patient will benefit from skilled therapeutic intervention in order to improve the following deficits and impairments:  Abnormal gait, Decreased range of motion, Pain, Decreased scar mobility, Decreased strength, Increased edema, Decreased mobility  Visit Diagnosis: Pain in right knee  Stiffness of right knee, not elsewhere classified  Generalized edema     Problem List Patient Active Problem List   Diagnosis Date Noted  . History of arthroplasty of right knee 03/05/2016  . OA (osteoarthritis) of knee 02/13/2016  . Leg edema 12/07/2015  . Preop cardiovascular exam 12/07/2015  . B12 deficiency 12/08/2014  . Metabolic syndrome [id number]  . Bradycardia 05/31/2014  . Hyperlipemia 04/28/2013  . Vitamin D deficiency 04/28/2013  . Lumbar spondylosis with myelopathy 04/28/2013  . Osteoarthritis 04/28/2013  . Hypertension 04/28/2013  . Incontinence of urine 04/28/2013  . Hypothyroidism 04/28/2013  . Cough 08/21/2011  . Abnormal CXR 08/21/2011    Phillips Climes, PTA 03/12/2016, 11:54 AM  [facility] [place], [place], [address] Phone: [phone]   Fax:  [phone]  Name: BURNADETTE SHIMA MRN: [id number] Date of Birth: 10/10/1951

## 2016-03-14 ENCOUNTER — Ambulatory Visit: Payer: BLUE CROSS/BLUE SHIELD | Admitting: Physical Therapy

## 2016-03-14 ENCOUNTER — Encounter: Payer: Self-pay | Admitting: Physical Therapy

## 2016-03-14 DIAGNOSIS — M25661 Stiffness of right knee, not elsewhere classified: Secondary | ICD-10-CM

## 2016-03-14 DIAGNOSIS — R601 Generalized edema: Secondary | ICD-10-CM

## 2016-03-14 DIAGNOSIS — M25561 Pain in right knee: Secondary | ICD-10-CM | POA: Diagnosis not present

## 2016-03-14 NOTE — Therapy (Signed)
[facility] [place], [place], [address] Phone: [phone]   Fax:  [phone]  Physical Therapy Treatment  Patient Details  Name: Miranda Wilson MRN: [id number] Date of Birth: 19-May-1951 Referring Provider: Gorden Harms, MD  Encounter Date: 03/14/2016      PT End of Session - 03/14/16 1055    Visit Number 8   Number of Visits 18   Date for PT Re-Evaluation 04/09/16   PT Start Time 4163   PT Stop Time 1130   PT Time Calculation (min) 58 min   Activity Tolerance Patient tolerated treatment well   Behavior During Therapy [facility] for tasks assessed/performed      Past Medical History  Diagnosis Date  . Hyperlipidemia   . Hypertension   . IBS (irritable bowel syndrome)   . Varicose veins   . PMB (postmenopausal bleeding)   . History of idiopathic seizure     AGE 23 to 20  X5  --  UNKNOW IDIOLOGY , PER PT WAS ANEMIC AT THE TIME--  NONE SINCE  . S/P radioactive iodine thyroid ablation   . History of tachycardia     S/P  RADIOACTIVE IODINE ABLATION OF THYROID  . History of colon polyps   . Cervical spondylosis   . OA (osteoarthritis)     shoulders  left > right  . GERD (gastroesophageal reflux disease)   . Chronic constipation   . Urge urinary incontinence   . Bilateral edema of lower extremity     left > right --  wear compression hose  . Hypothyroidism, postradioiodine therapy     AGE 68  . At risk for sleep apnea   . Vein disorder     LEFT ANKLE VEIN VALVE REFLUX WITH DECREASED CIRCULATION     Past Surgical History  Procedure Laterality Date  . Varicose vein surgery  1998 to 2010    includes laser and phlebectomies  . Cesarean section  1979  . Endovenous ablation saphenous vein w/ laser  04/2007  . Tonsillectomy  1955  . Colonoscopy w/ polypectomy  10-25-2008  . Total knee arthroplasty Left 12-19-2009  . Closed left knee manipulation and right knee cortisone injection  02-20-2010  . Left knee  arthrotomy w/ lysis adhesions  12-01-2010  . Laparoscopic cholecystectomy  1993    and BILATERAL TUBAL LIGATION  . Cataract extraction w/ intraocular lens  implant, bilateral  right 2011//  left 2013  . Hysteroscopy w/d&c N/A 11/11/2014    Procedure: DILATATION AND CURETTAGE /HYSTEROSCOPY;  Surgeon: Gus Height, MD;  Location: [facility];  Service: Gynecology;  Laterality: N/A;  . Eye surgery    . Dilation and curettage of uterus    . Joint replacement    . Total knee arthroplasty Right 02/13/2016    Procedure: RIGHT TOTAL KNEE ARTHROPLASTY;  Surgeon: Gaynelle Arabian, MD;  Location: [facility];  Service: Orthopedics;  Laterality: Right;    There were no vitals filed for this visit.      Subjective Assessment - 03/14/16 1038    Subjective Patient reported stiffness and swelling in knee.   Pertinent History HTN, venous insuffieciency, back problems, disc problems, L TKR 2011   Patient Stated Goals walk and stand with good balance/comfort; regain ROM   Currently in Pain? Yes   Pain Score 5    Pain Location Knee   Pain Orientation Right   Pain Descriptors / Indicators Sore   Pain Type Surgical pain   Pain Onset 1  to 4 weeks ago   Pain Frequency Intermittent   Aggravating Factors  ROM   Pain Relieving Factors rest and ice            [facility] PT Assessment - 03/14/16 0001    AROM   Overall AROM  Deficits   AROM Assessment Site Knee   Right/Left Knee Right   Right Knee Extension -15   Right Knee Flexion 90   PROM   PROM Assessment Site Knee   Right/Left Knee Right   Right Knee Extension -9   Right Knee Flexion 105                     [facility] Adult PT Treatment/Exercise - 03/14/16 0001    Knee/Hip Exercises: Stretches   Active Hamstring Stretch Both;3 reps;30 seconds   Knee/Hip Exercises: Aerobic   Nustep 56mn L5 for ROM (right LE /bil UE)   Knee/Hip Exercises: Standing   Rocker Board 3 minutes   EAcupuncturist Location R knee   Electrical Stimulation Action IFC   Electrical Stimulation Parameters 1-'10hz'    Electrical Stimulation Goals Edema;Pain   Vasopneumatic   Number Minutes Vasopneumatic  15 minutes   Vasopnuematic Location  Knee   Vasopneumatic Pressure Low   Manual Therapy   Manual Therapy Passive ROM   Passive ROM PROM of R knee into flexion/ext in supine to increase R knee ROM  Patella mobs                  PT Short Term Goals - 03/14/16 1056    PT SHORT TERM GOAL #1   Title Decreased edema in R knee to within 1.5 cm of L   Time 3   Period Weeks   Status Achieved  1.5 03/14/16           PT Long Term Goals - 03/06/16 1115    PT LONG TERM GOAL #1   Title I with HEP   Time 6   Period Weeks   Status On-going   PT LONG TERM GOAL #2   Title Improved R knee ROM -5 to 120 degrees to improve function   Time 6   Period Weeks   Status New   PT LONG TERM GOAL #3   Title Patient able to amb 300 feet safely without AD   Time 6   Period Weeks   Status On-going   PT LONG TERM GOAL #4   Title Able to climb stairs with reciprocal gait and 5/5 knee strength   Time 6   Period Weeks   Status On-going               Plan - 03/14/16 1118    Clinical Impression Statement Patient progressing with improved PROM and decreased swelling in right knee today. Patient reports doing HEP daily and has increased edema after prolong standing. Patient met STG #1 and LTG's ongoing due to ROM, strength and pain deficits.   Rehab Potential Excellent   PT Frequency 3x / week   PT Duration 6 weeks   PT Treatment/Interventions ADLs/Self Care Home Management;Electrical Stimulation;Cryotherapy;Therapeutic exercise;Manual techniques;Vasopneumatic Device;Passive range of motion;Patient/family education;Gait training;Stair training;Balance training;Neuromuscular re-education;Scar mobilization   PT Next Visit Plan Continue per TKR protocol with modalities PRN for pain. Assess R knee ROM next  treatment. (MD. AMaureen Ralphs5/23/17)   Consulted and Agree with Plan of Care Patient      Patient will benefit from skilled therapeutic intervention in order  to improve the following deficits and impairments:  Abnormal gait, Decreased range of motion, Pain, Decreased scar mobility, Decreased strength, Increased edema, Decreased mobility  Visit Diagnosis: Pain in right knee  Stiffness of right knee, not elsewhere classified  Generalized edema     Problem List Patient Active Problem List   Diagnosis Date Noted  . History of arthroplasty of right knee 03/05/2016  . OA (osteoarthritis) of knee 02/13/2016  . Leg edema 12/07/2015  . Preop cardiovascular exam 12/07/2015  . B12 deficiency 12/08/2014  . Metabolic syndrome 92/34/1443  . Bradycardia 05/31/2014  . Hyperlipemia 04/28/2013  . Vitamin D deficiency 04/28/2013  . Lumbar spondylosis with myelopathy 04/28/2013  . Osteoarthritis 04/28/2013  . Hypertension 04/28/2013  . Incontinence of urine 04/28/2013  . Hypothyroidism 04/28/2013  . Cough 08/21/2011  . Abnormal CXR 08/21/2011    Phillips Climes, PTA 03/14/2016, 11:42 AM  [facility] [place], [place], [address] Phone: [phone]   Fax:  [phone]  Name: Miranda Wilson MRN: [id number] Date of Birth: 06/25/1951

## 2016-03-16 ENCOUNTER — Ambulatory Visit: Payer: BLUE CROSS/BLUE SHIELD | Admitting: Physical Therapy

## 2016-03-16 DIAGNOSIS — M25661 Stiffness of right knee, not elsewhere classified: Secondary | ICD-10-CM

## 2016-03-16 DIAGNOSIS — M25561 Pain in right knee: Secondary | ICD-10-CM

## 2016-03-16 NOTE — Therapy (Signed)
[facility] [place], [place], [address] Phone: [phone]   Fax:  [phone]  Physical Therapy Treatment  Patient Details  Name: Miranda Wilson MRN: [id number] Date of Birth: 1951-08-19 Referring Provider: Gorden Harms, MD  Encounter Date: 03/16/2016      PT End of Session - 03/16/16 1235    Visit Number 9   Number of Visits 18   Date for PT Re-Evaluation 04/09/16   PT Start Time 1030   PT Stop Time 1117   PT Time Calculation (min) 47 min   Activity Tolerance Patient tolerated treatment well   Behavior During Therapy [facility] for tasks assessed/performed      Past Medical History  Diagnosis Date  . Hyperlipidemia   . Hypertension   . IBS (irritable bowel syndrome)   . Varicose veins   . PMB (postmenopausal bleeding)   . History of idiopathic seizure     AGE 65 to 65  X5  --  UNKNOW IDIOLOGY , PER PT WAS ANEMIC AT THE TIME--  NONE SINCE  . S/P radioactive iodine thyroid ablation   . History of tachycardia     S/P  RADIOACTIVE IODINE ABLATION OF THYROID  . History of colon polyps   . Cervical spondylosis   . OA (osteoarthritis)     shoulders  left > right  . GERD (gastroesophageal reflux disease)   . Chronic constipation   . Urge urinary incontinence   . Bilateral edema of lower extremity     left > right --  wear compression hose  . Hypothyroidism, postradioiodine therapy     AGE 65  . At risk for sleep apnea   . Vein disorder     LEFT ANKLE VEIN VALVE REFLUX WITH DECREASED CIRCULATION     Past Surgical History  Procedure Laterality Date  . Varicose vein surgery  1998 to 2010    includes laser and phlebectomies  . Cesarean section  1979  . Endovenous ablation saphenous vein w/ laser  04/2007  . Tonsillectomy  1955  . Colonoscopy w/ polypectomy  10-25-2008  . Total knee arthroplasty Left 12-19-2009  . Closed left knee manipulation and right knee cortisone injection  02-20-2010  . Left knee  arthrotomy w/ lysis adhesions  12-01-2010  . Laparoscopic cholecystectomy  1993    and BILATERAL TUBAL LIGATION  . Cataract extraction w/ intraocular lens  implant, bilateral  right 2011//  left 2013  . Hysteroscopy w/d&c N/A 11/11/2014    Procedure: DILATATION AND CURETTAGE /HYSTEROSCOPY;  Surgeon: Gus Height, MD;  Location: [facility];  Service: Gynecology;  Laterality: N/A;  . Eye surgery    . Dilation and curettage of uterus    . Joint replacement    . Total knee arthroplasty Right 02/13/2016    Procedure: RIGHT TOTAL KNEE ARTHROPLASTY;  Surgeon: Gaynelle Arabian, MD;  Location: [facility];  Service: Orthopedics;  Laterality: Right;    There were no vitals filed for this visit.      Subjective Assessment - 03/16/16 1234    Subjective I feel like I'm losing ground.   Patient Stated Goals walk and stand with good balance/comfort; regain ROM   Pain Score 5    Pain Location Knee   Pain Orientation Right   Pain Descriptors / Indicators Sore   Pain Type Surgical pain   Pain Onset 1 to 4 weeks ago  PT Short Term Goals - 03/14/16 1056    PT SHORT TERM GOAL #1   Title Decreased edema in R knee to within 1.5 cm of L   Time 3   Period Weeks   Status Achieved  1.5 03/14/16           PT Long Term Goals - 03/06/16 1115    PT LONG TERM GOAL #1   Title I with HEP   Time 6   Period Weeks   Status On-going   PT LONG TERM GOAL #2   Title Improved R knee ROM -5 to 120 degrees to improve function   Time 6   Period Weeks   Status New   PT LONG TERM GOAL #3   Title Patient able to amb 300 feet safely without AD   Time 6   Period Weeks   Status On-going   PT LONG TERM GOAL #4   Title Able to climb stairs with reciprocal gait and 5/5 knee strength   Time 6   Period Weeks   Status On-going             Patient will benefit from skilled therapeutic intervention in order to improve the following deficits  and impairments:     Visit Diagnosis: Pain in right knee  Stiffness of right knee, not elsewhere classified     Problem List Patient Active Problem List   Diagnosis Date Noted  . History of arthroplasty of right knee 03/05/2016  . OA (osteoarthritis) of knee 02/13/2016  . Leg edema 12/07/2015  . Preop cardiovascular exam 12/07/2015  . B12 deficiency 12/08/2014  . Metabolic syndrome [id number]  . Bradycardia 05/31/2014  . Hyperlipemia 04/28/2013  . Vitamin D deficiency 04/28/2013  . Lumbar spondylosis with myelopathy 04/28/2013  . Osteoarthritis 04/28/2013  . Hypertension 04/28/2013  . Incontinence of urine 04/28/2013  . Hypothyroidism 04/28/2013  . Cough 08/21/2011  . Abnormal CXR 08/21/2011  Treatment:  Nustep level 4 x 15 minutes moving forward x 2 to increase flexion f/b PROM x 9 minutes f/b IFC and vasopneumatic x 15 minutes.  Patient achieved 105 degrees of passive right knee flexion today.  Tymon Nemetz, [place] MPT 03/16/2016, 12:41 PM  [facility] [address] [place], [place], [address] Phone: [phone]   Fax:  [phone]  Name: Miranda Wilson MRN: [id number] Date of Birth: October 25, 1951

## 2016-03-19 ENCOUNTER — Other Ambulatory Visit: Payer: Self-pay | Admitting: *Deleted

## 2016-03-19 ENCOUNTER — Telehealth: Payer: Self-pay | Admitting: Family Medicine

## 2016-03-19 ENCOUNTER — Ambulatory Visit: Payer: BLUE CROSS/BLUE SHIELD | Admitting: Physical Therapy

## 2016-03-19 DIAGNOSIS — M25561 Pain in right knee: Secondary | ICD-10-CM | POA: Diagnosis not present

## 2016-03-19 DIAGNOSIS — R601 Generalized edema: Secondary | ICD-10-CM

## 2016-03-19 DIAGNOSIS — M25661 Stiffness of right knee, not elsewhere classified: Secondary | ICD-10-CM

## 2016-03-19 DIAGNOSIS — R7989 Other specified abnormal findings of blood chemistry: Secondary | ICD-10-CM

## 2016-03-19 DIAGNOSIS — R945 Abnormal results of liver function studies: Principal | ICD-10-CM

## 2016-03-19 NOTE — Therapy (Signed)
[facility] [place], [place], [address] Phone: [phone]   Fax:  [phone]  Physical Therapy Treatment  Patient Details  Name: Miranda Wilson MRN: [id number] Date of Birth: October 20, 1951 Referring Provider: Gorden Harms, MD  Encounter Date: 03/19/2016      PT End of Session - 03/19/16 1631    Visit Number 10   Number of Visits 18   Date for PT Re-Evaluation 04/09/16   PT Start Time 0315   PT Stop Time 0404   PT Time Calculation (min) 49 min   Activity Tolerance Patient tolerated treatment well   Behavior During Therapy [facility] for tasks assessed/performed      Past Medical History  Diagnosis Date  . Hyperlipidemia   . Hypertension   . IBS (irritable bowel syndrome)   . Varicose veins   . PMB (postmenopausal bleeding)   . History of idiopathic seizure     AGE 65 to 20  X5  --  UNKNOW IDIOLOGY , PER PT WAS ANEMIC AT THE TIME--  NONE SINCE  . S/P radioactive iodine thyroid ablation   . History of tachycardia     S/P  RADIOACTIVE IODINE ABLATION OF THYROID  . History of colon polyps   . Cervical spondylosis   . OA (osteoarthritis)     shoulders  left > right  . GERD (gastroesophageal reflux disease)   . Chronic constipation   . Urge urinary incontinence   . Bilateral edema of lower extremity     left > right --  wear compression hose  . Hypothyroidism, postradioiodine therapy     AGE 65  . At risk for sleep apnea   . Vein disorder     LEFT ANKLE VEIN VALVE REFLUX WITH DECREASED CIRCULATION     Past Surgical History  Procedure Laterality Date  . Varicose vein surgery  1998 to 2010    includes laser and phlebectomies  . Cesarean section  1979  . Endovenous ablation saphenous vein w/ laser  04/2007  . Tonsillectomy  1955  . Colonoscopy w/ polypectomy  10-25-2008  . Total knee arthroplasty Left 12-19-2009  . Closed left knee manipulation and right knee cortisone injection  02-20-2010  . Left knee  arthrotomy w/ lysis adhesions  12-01-2010  . Laparoscopic cholecystectomy  1993    and BILATERAL TUBAL LIGATION  . Cataract extraction w/ intraocular lens  implant, bilateral  right 2011//  left 2013  . Hysteroscopy w/d&c N/A 11/11/2014    Procedure: DILATATION AND CURETTAGE /HYSTEROSCOPY;  Surgeon: Gus Height, MD;  Location: [facility];  Service: Gynecology;  Laterality: N/A;  . Eye surgery    . Dilation and curettage of uterus    . Joint replacement    . Total knee arthroplasty Right 02/13/2016    Procedure: RIGHT TOTAL KNEE ARTHROPLASTY;  Surgeon: Gaynelle Arabian, MD;  Location: [facility];  Service: Orthopedics;  Laterality: Right;    There were no vitals filed for this visit.          [facility] PT Assessment - 03/19/16 0001    AROM   Right/Left Knee Right   Right Knee Extension -13   Right Knee Flexion 100   PROM   Right/Left Knee Right   Right Knee Extension -9   Right Knee Flexion 107                     [facility] Adult PT Treatment/Exercise - 03/19/16 0001    Knee/Hip Exercises:  Aerobic   Nustep Level 5 x 17 minutes.   Acupuncturist Location --  Right knee.   Electrical Stimulation Action IFC   Electrical Stimulation Parameters 80-150 Hz x 15 minutes.   Electrical Stimulation Goals Edema;Pain   Manual Therapy   Manual Therapy Passive ROM   Passive ROM PROM into right knee flexion and extension x 9 minutes.                  PT Short Term Goals - 03/14/16 1056    PT SHORT TERM GOAL #1   Title Decreased edema in R knee to within 1.5 cm of L   Time 3   Period Weeks   Status Achieved  1.5 03/14/16           PT Long Term Goals - 03/06/16 1115    PT LONG TERM GOAL #1   Title I with HEP   Time 6   Period Weeks   Status On-going   PT LONG TERM GOAL #2   Title Improved R knee ROM -5 to 120 degrees to improve function   Time 6   Period Weeks   Status New   PT LONG TERM GOAL #3   Title Patient  able to amb 300 feet safely without AD   Time 6   Period Weeks   Status On-going   PT LONG TERM GOAL #4   Title Able to climb stairs with reciprocal gait and 5/5 knee strength   Time 6   Period Weeks   Status On-going               Plan - April 11, 2016 1638    Clinical Impression Statement The patient is progressing well with improving right knee range of motion.  She is compliant to her HEP and is riding a stationary bike.   Rehab Potential Excellent   PT Frequency 3x / week   PT Duration 6 weeks   PT Treatment/Interventions ADLs/Self Care Home Management;Electrical Stimulation;Cryotherapy;Therapeutic exercise;Manual techniques;Vasopneumatic Device;Passive range of motion;Patient/family education;Gait training;Stair training;Balance training;Neuromuscular re-education;Scar mobilization   PT Next Visit Plan Continue per TKR protocol with modalities PRN for pain. Assess R knee ROM next treatment. (MD. Maureen Ralphs 03/20/16)      Patient will benefit from skilled therapeutic intervention in order to improve the following deficits and impairments:  Abnormal gait, Decreased range of motion, Pain, Decreased scar mobility, Decreased strength, Increased edema, Decreased mobility  Visit Diagnosis: Pain in right knee  Stiffness of right knee, not elsewhere classified  Generalized edema       G-Codes - April 11, 2016 1631    Functional Assessment Tool Used Clinical judgement...10th visit.   Functional Limitation Mobility: Walking and moving around   Mobility: Walking and Moving Around Current Status [phone]) At least 40 percent but less than 60 percent impaired, limited or restricted   Mobility: Walking and Moving Around Goal Status [phone]) At least 40 percent but less than 60 percent impaired, limited or restricted      Problem List Patient Active Problem List   Diagnosis Date Noted  . History of arthroplasty of right knee 03/05/2016  . OA (osteoarthritis) of knee 02/13/2016  . Leg edema  12/07/2015  . Preop cardiovascular exam 12/07/2015  . B12 deficiency 12/08/2014  . Metabolic syndrome [id number]  . Bradycardia 05/31/2014  . Hyperlipemia 04/28/2013  . Vitamin D deficiency 04/28/2013  . Lumbar spondylosis with myelopathy 04/28/2013  . Osteoarthritis 04/28/2013  . Hypertension 04/28/2013  . Incontinence of  urine 04/28/2013  . Hypothyroidism 04/28/2013  . Cough 08/21/2011  . Abnormal CXR 08/21/2011    Zylen Wenig, [place] MPT 03/19/2016, 4:40 PM  [facility] [address] [place], [place], [address] Phone: [phone]   Fax:  [phone]  Name: SHANVIKA DICKERSON MRN: [id number] Date of Birth: May 13, 1951

## 2016-03-21 ENCOUNTER — Ambulatory Visit: Payer: BLUE CROSS/BLUE SHIELD | Admitting: Physical Therapy

## 2016-03-21 DIAGNOSIS — M25661 Stiffness of right knee, not elsewhere classified: Secondary | ICD-10-CM

## 2016-03-21 DIAGNOSIS — M25561 Pain in right knee: Secondary | ICD-10-CM | POA: Diagnosis not present

## 2016-03-21 DIAGNOSIS — R601 Generalized edema: Secondary | ICD-10-CM

## 2016-03-21 NOTE — Therapy (Signed)
[facility] [place], [place], [address] Phone: [phone]   Fax:  [phone]  Physical Therapy Treatment  Patient Details  Name: Miranda Wilson MRN: [id number] Date of Birth: August 03, 1951 Referring Provider: Gorden Harms, MD  Encounter Date: 03/21/2016      PT End of Session - 03/21/16 1706    Visit Number 11   Number of Visits 18   Date for PT Re-Evaluation 04/09/16   PT Start Time 1200   PT Stop Time 1250   PT Time Calculation (min) 50 min      Past Medical History  Diagnosis Date  . Hyperlipidemia   . Hypertension   . IBS (irritable bowel syndrome)   . Varicose veins   . PMB (postmenopausal bleeding)   . History of idiopathic seizure     AGE 83 to 20  X5  --  UNKNOW IDIOLOGY , PER PT WAS ANEMIC AT THE TIME--  NONE SINCE  . S/P radioactive iodine thyroid ablation   . History of tachycardia     S/P  RADIOACTIVE IODINE ABLATION OF THYROID  . History of colon polyps   . Cervical spondylosis   . OA (osteoarthritis)     shoulders  left > right  . GERD (gastroesophageal reflux disease)   . Chronic constipation   . Urge urinary incontinence   . Bilateral edema of lower extremity     left > right --  wear compression hose  . Hypothyroidism, postradioiodine therapy     AGE 65  . At risk for sleep apnea   . Vein disorder     LEFT ANKLE VEIN VALVE REFLUX WITH DECREASED CIRCULATION     Past Surgical History  Procedure Laterality Date  . Varicose vein surgery  1998 to 2010    includes laser and phlebectomies  . Cesarean section  1979  . Endovenous ablation saphenous vein w/ laser  04/2007  . Tonsillectomy  1955  . Colonoscopy w/ polypectomy  10-25-2008  . Total knee arthroplasty Left 12-19-2009  . Closed left knee manipulation and right knee cortisone injection  02-20-2010  . Left knee arthrotomy w/ lysis adhesions  12-01-2010  . Laparoscopic cholecystectomy  1993    and BILATERAL TUBAL LIGATION  .  Cataract extraction w/ intraocular lens  implant, bilateral  right 2011//  left 2013  . Hysteroscopy w/d&c N/A 11/11/2014    Procedure: DILATATION AND CURETTAGE /HYSTEROSCOPY;  Surgeon: Gus Height, MD;  Location: [facility];  Service: Gynecology;  Laterality: N/A;  . Eye surgery    . Dilation and curettage of uterus    . Joint replacement    . Total knee arthroplasty Right 02/13/2016    Procedure: RIGHT TOTAL KNEE ARTHROPLASTY;  Surgeon: Gaynelle Arabian, MD;  Location: [facility];  Service: Orthopedics;  Laterality: Right;    There were no vitals filed for this visit.      Subjective Assessment - 03/21/16 1706    Subjective Dr. Wynelle Link was pleased with my progress.   Pertinent History HTN, venous insuffieciency, back problems, disc problems, L TKR 2011   Patient Stated Goals walk and stand with good balance/comfort; regain ROM   Pain Score 5    Pain Location Knee   Pain Orientation Right   Pain Descriptors / Indicators Sore   Pain Type Surgical pain   Pain Onset 1 to 4 weeks ago  [facility] Adult PT Treatment/Exercise - 03/21/16 0001    Exercises   Exercises Knee/Hip   Knee/Hip Exercises: Aerobic   Nustep Level 5 x 16 minutes.   Knee/Hip Exercises: Machines for Strengthening   Cybex Knee Extension 10# for flexion stretching and assisted extension x 6 minutes.   Acupuncturist Location --  Right.   Electrical Stimulation Action IFC   Electrical Stimulation Parameters 80-150 Hz x 15 minutes.   Manual Therapy   Manual Therapy Passive ROM   Passive ROM PROM x 5 minutes into right knee flexion and extension.                  PT Short Term Goals - 03/14/16 1056    PT SHORT TERM GOAL #1   Title Decreased edema in R knee to within 1.5 cm of L   Time 3   Period Weeks   Status Achieved  1.5 03/14/16           PT Long Term Goals - 03/06/16 1115    PT LONG TERM GOAL #1   Title I with  HEP   Time 6   Period Weeks   Status On-going   PT LONG TERM GOAL #2   Title Improved R knee ROM -5 to 120 degrees to improve function   Time 6   Period Weeks   Status New   PT LONG TERM GOAL #3   Title Patient able to amb 300 feet safely without AD   Time 6   Period Weeks   Status On-going   PT LONG TERM GOAL #4   Title Able to climb stairs with reciprocal gait and 5/5 knee strength   Time 6   Period Weeks   Status On-going             Patient will benefit from skilled therapeutic intervention in order to improve the following deficits and impairments:  Abnormal gait, Decreased range of motion, Pain, Decreased scar mobility, Decreased strength, Increased edema, Decreased mobility  Visit Diagnosis: Pain in right knee  Stiffness of right knee, not elsewhere classified  Generalized edema     Problem List Patient Active Problem List   Diagnosis Date Noted  . History of arthroplasty of right knee 03/05/2016  . OA (osteoarthritis) of knee 02/13/2016  . Leg edema 12/07/2015  . Preop cardiovascular exam 12/07/2015  . B12 deficiency 12/08/2014  . Metabolic syndrome [id number]  . Bradycardia 05/31/2014  . Hyperlipemia 04/28/2013  . Vitamin D deficiency 04/28/2013  . Lumbar spondylosis with myelopathy 04/28/2013  . Osteoarthritis 04/28/2013  . Hypertension 04/28/2013  . Incontinence of urine 04/28/2013  . Hypothyroidism 04/28/2013  . Cough 08/21/2011  . Abnormal CXR 08/21/2011    APPLEGATE, [place] MPT 03/21/2016, 5:11 PM  [facility] [address] [place], [place], [address] Phone: [phone]   Fax:  [phone]  Name: Miranda Wilson MRN: [id number] Date of Birth: 07-15-1951

## 2016-03-23 ENCOUNTER — Ambulatory Visit: Payer: BLUE CROSS/BLUE SHIELD | Admitting: *Deleted

## 2016-03-23 DIAGNOSIS — M25561 Pain in right knee: Secondary | ICD-10-CM | POA: Diagnosis not present

## 2016-03-23 DIAGNOSIS — M25661 Stiffness of right knee, not elsewhere classified: Secondary | ICD-10-CM

## 2016-03-23 NOTE — Therapy (Signed)
[facility] [place], [place], [address] Phone: [phone]   Fax:  [phone]  Physical Therapy Treatment  Patient Details  Name: Miranda Wilson MRN: [id number] Date of Birth: 01-09-1951 Referring Provider: Gorden Harms, MD  Encounter Date: 03/23/2016      PT End of Session - 03/23/16 1146    Visit Number 12   Number of Visits 18   Date for PT Re-Evaluation 04/09/16   PT Start Time 1030   PT Stop Time 1119   PT Time Calculation (min) 49 min      Past Medical History  Diagnosis Date  . Hyperlipidemia   . Hypertension   . IBS (irritable bowel syndrome)   . Varicose veins   . PMB (postmenopausal bleeding)   . History of idiopathic seizure     AGE 65 to 20  X5  --  UNKNOW IDIOLOGY , PER PT WAS ANEMIC AT THE TIME--  NONE SINCE  . S/P radioactive iodine thyroid ablation   . History of tachycardia     S/P  RADIOACTIVE IODINE ABLATION OF THYROID  . History of colon polyps   . Cervical spondylosis   . OA (osteoarthritis)     shoulders  left > right  . GERD (gastroesophageal reflux disease)   . Chronic constipation   . Urge urinary incontinence   . Bilateral edema of lower extremity     left > right --  wear compression hose  . Hypothyroidism, postradioiodine therapy     AGE 65  . At risk for sleep apnea   . Vein disorder     LEFT ANKLE VEIN VALVE REFLUX WITH DECREASED CIRCULATION     Past Surgical History  Procedure Laterality Date  . Varicose vein surgery  1998 to 2010    includes laser and phlebectomies  . Cesarean section  1979  . Endovenous ablation saphenous vein w/ laser  04/2007  . Tonsillectomy  1955  . Colonoscopy w/ polypectomy  10-25-2008  . Total knee arthroplasty Left 12-19-2009  . Closed left knee manipulation and right knee cortisone injection  02-20-2010  . Left knee arthrotomy w/ lysis adhesions  12-01-2010  . Laparoscopic cholecystectomy  1993    and BILATERAL TUBAL LIGATION  .  Cataract extraction w/ intraocular lens  implant, bilateral  right 2011//  left 2013  . Hysteroscopy w/d&c N/A 11/11/2014    Procedure: DILATATION AND CURETTAGE /HYSTEROSCOPY;  Surgeon: Gus Height, MD;  Location: [facility];  Service: Gynecology;  Laterality: N/A;  . Eye surgery    . Dilation and curettage of uterus    . Joint replacement    . Total knee arthroplasty Right 02/13/2016    Procedure: RIGHT TOTAL KNEE ARTHROPLASTY;  Surgeon: Gaynelle Arabian, MD;  Location: [facility];  Service: Orthopedics;  Laterality: Right;    There were no vitals filed for this visit.      Subjective Assessment - 03/23/16 1143    Currently in Pain? Yes   Pain Score 5    Pain Location Knee   Pain Orientation Right   Pain Descriptors / Indicators Sore   Pain Type Surgical pain   Pain Onset 1 to 4 weeks ago   Pain Frequency Intermittent            [facility] PT Assessment - 03/23/16 0001    PROM   Right/Left Knee Right   Right Knee Extension -8   Right Knee Flexion 107  [facility] Adult PT Treatment/Exercise - 03/23/16 0001    Exercises   Exercises Knee/Hip   Knee/Hip Exercises: Aerobic   Nustep Level 5 x 16 minutes.   Knee/Hip Exercises: Machines for Strengthening   Cybex Knee Extension --   Knee/Hip Exercises: Standing   Forward Lunges Right;2 sets;10 reps;3 seconds;5 seconds;2 seconds  hold 10 secs   Rocker Board 5 minutes   Modalities   Modalities Electrical Stimulation;Vasopneumatic   Acupuncturist Location R knee   Electrical Stimulation Action IFC       Electrical Stimulation Parameters 80-150hz  x15 mins    Electrical Stimulation Goals Edema;Pain   Vasopneumatic   Number Minutes Vasopneumatic  15 minutes   Vasopnuematic Location  Knee   Vasopneumatic Pressure Low   Vasopneumatic Temperature  34   Manual Therapy   Manual Therapy Passive ROM   Passive ROM PROM x 5 minutes into right knee flexion and  extension.using contract/relax technique                  PT Short Term Goals - 03/14/16 1056    PT SHORT TERM GOAL #1   Title Decreased edema in R knee to within 1.5 cm of L   Time 3   Period Weeks   Status Achieved  1.5 03/14/16           PT Long Term Goals - 03/06/16 1115    PT LONG TERM GOAL #1   Title I with HEP   Time 6   Period Weeks   Status On-going   PT LONG TERM GOAL #2   Title Improved R knee ROM -5 to 120 degrees to improve function   Time 6   Period Weeks   Status New   PT LONG TERM GOAL #3   Title Patient able to amb 300 feet safely without AD   Time 6   Period Weeks   Status On-going   PT LONG TERM GOAL #4   Title Able to climb stairs with reciprocal gait and 5/5 knee strength   Time 6   Period Weeks   Status On-going               Plan - 03/23/16 1150    Clinical Impression Statement Pt did fairly well today with exs and Acts for RT knee. She did not wan't to try LLLDS on machine today and opted for 12 in box flexion lunges and manual PROM in sitting. Pt did well with contract/relax  technique to facilitate flexion ROM   Rehab Potential Excellent   PT Frequency 3x / week   PT Duration 6 weeks   PT Treatment/Interventions ADLs/Self Care Home Management;Electrical Stimulation;Cryotherapy;Therapeutic exercise;Manual techniques;Vasopneumatic Device;Passive range of motion;Patient/family education;Gait training;Stair training;Balance training;Neuromuscular re-education;Scar mobilization   PT Next Visit Plan Continue per TKR protocol with modalities PRN for pain. Assess R knee ROM next treatment.   Consulted and Agree with Plan of Care Patient      Patient will benefit from skilled therapeutic intervention in order to improve the following deficits and impairments:  Abnormal gait, Decreased range of motion, Pain, Decreased scar mobility, Decreased strength, Increased edema, Decreased mobility  Visit Diagnosis: Pain in right  knee  Stiffness of right knee, not elsewhere classified     Problem List Patient Active Problem List   Diagnosis Date Noted  . History of arthroplasty of right knee 03/05/2016  . OA (osteoarthritis) of knee 02/13/2016  . Leg edema 12/07/2015  . Preop cardiovascular  exam 12/07/2015  . B12 deficiency 12/08/2014  . Metabolic syndrome [id number]  . Bradycardia 05/31/2014  . Hyperlipemia 04/28/2013  . Vitamin D deficiency 04/28/2013  . Lumbar spondylosis with myelopathy 04/28/2013  . Osteoarthritis 04/28/2013  . Hypertension 04/28/2013  . Incontinence of urine 04/28/2013  . Hypothyroidism 04/28/2013  . Cough 08/21/2011  . Abnormal CXR 08/21/2011    RAMSEUR,CHRIS, PTA 03/23/2016, 1:04 PM  [facility] [address] [place], [place], [address] Phone: [phone]   Fax:  [phone]  Name: PERIS SHAFIQ MRN: [id number] Date of Birth: 1951-09-21

## 2016-03-24 ENCOUNTER — Other Ambulatory Visit: Payer: Self-pay | Admitting: Family Medicine

## 2016-03-28 ENCOUNTER — Ambulatory Visit: Payer: BLUE CROSS/BLUE SHIELD | Admitting: Physical Therapy

## 2016-03-28 ENCOUNTER — Encounter: Payer: Self-pay | Admitting: Physical Therapy

## 2016-03-28 DIAGNOSIS — M25561 Pain in right knee: Secondary | ICD-10-CM | POA: Diagnosis not present

## 2016-03-28 DIAGNOSIS — M25661 Stiffness of right knee, not elsewhere classified: Secondary | ICD-10-CM

## 2016-03-28 DIAGNOSIS — R601 Generalized edema: Secondary | ICD-10-CM

## 2016-03-28 NOTE — Therapy (Signed)
[facility] [place], [place], [address] Phone: [phone]   Fax:  [phone]  Physical Therapy Treatment  Patient Details  Name: Miranda Wilson MRN: [id number] Date of Birth: Jul 14, 1951 Referring Provider: Gorden Harms, MD  Encounter Date: 03/28/2016      PT End of Session - 03/28/16 1112    Visit Number 13   Number of Visits 18   Date for PT Re-Evaluation 04/09/16   PT Start Time 1030   PT Stop Time 1127   PT Time Calculation (min) 57 min   Activity Tolerance Patient tolerated treatment well   Behavior During Therapy [facility] for tasks assessed/performed      Past Medical History  Diagnosis Date  . Hyperlipidemia   . Hypertension   . IBS (irritable bowel syndrome)   . Varicose veins   . PMB (postmenopausal bleeding)   . History of idiopathic seizure     AGE 29 to 20  X5  --  UNKNOW IDIOLOGY , PER PT WAS ANEMIC AT THE TIME--  NONE SINCE  . S/P radioactive iodine thyroid ablation   . History of tachycardia     S/P  RADIOACTIVE IODINE ABLATION OF THYROID  . History of colon polyps   . Cervical spondylosis   . OA (osteoarthritis)     shoulders  left > right  . GERD (gastroesophageal reflux disease)   . Chronic constipation   . Urge urinary incontinence   . Bilateral edema of lower extremity     left > right --  wear compression hose  . Hypothyroidism, postradioiodine therapy     AGE 36  . At risk for sleep apnea   . Vein disorder     LEFT ANKLE VEIN VALVE REFLUX WITH DECREASED CIRCULATION     Past Surgical History  Procedure Laterality Date  . Varicose vein surgery  1998 to 2010    includes laser and phlebectomies  . Cesarean section  1979  . Endovenous ablation saphenous vein w/ laser  04/2007  . Tonsillectomy  1955  . Colonoscopy w/ polypectomy  10-25-2008  . Total knee arthroplasty Left 12-19-2009  . Closed left knee manipulation and right knee cortisone injection  02-20-2010  . Left knee  arthrotomy w/ lysis adhesions  12-01-2010  . Laparoscopic cholecystectomy  1993    and BILATERAL TUBAL LIGATION  . Cataract extraction w/ intraocular lens  implant, bilateral  right 2011//  left 2013  . Hysteroscopy w/d&c N/A 11/11/2014    Procedure: DILATATION AND CURETTAGE /HYSTEROSCOPY;  Surgeon: Gus Height, MD;  Location: [facility];  Service: Gynecology;  Laterality: N/A;  . Eye surgery    . Dilation and curettage of uterus    . Joint replacement    . Total knee arthroplasty Right 02/13/2016    Procedure: RIGHT TOTAL KNEE ARTHROPLASTY;  Surgeon: Gaynelle Arabian, MD;  Location: [facility];  Service: Orthopedics;  Laterality: Right;    There were no vitals filed for this visit.      Subjective Assessment - 03/28/16 1034    Subjective Patient has reported little soreness today yet ongoing swelling in right knee   Pertinent History HTN, venous insuffieciency, back problems, disc problems, L TKR 2011   Patient Stated Goals walk and stand with good balance/comfort; regain ROM   Currently in Pain? Yes   Pain Score 3    Pain Location Knee   Pain Orientation Right   Pain Descriptors / Indicators Sore   Pain Type Surgical pain  Pain Onset More than a month ago   Pain Frequency Intermittent   Aggravating Factors  ROM in knee   Pain Relieving Factors rest            [facility] PT Assessment - 03/28/16 0001    AROM   Overall AROM  Deficits   AROM Assessment Site Knee   Right/Left Knee Right   Right Knee Extension -14   Right Knee Flexion 94   PROM   Overall PROM  Deficits   PROM Assessment Site Knee   Right/Left Knee Right   Right Knee Extension -10   Right Knee Flexion 105                     [facility] Adult PT Treatment/Exercise - 03/28/16 0001    Knee/Hip Exercises: Stretches   Knee: Self-Stretch to increase Flexion Right;3 reps;30 seconds   Knee/Hip Exercises: Aerobic   Nustep 63min L5 adjusted for ROM UE/Right LE   Knee/Hip Exercises: Standing    Rocker Board 3 minutes   Acupuncturist Location R knee   Electrical Stimulation Action IFC   Electrical Stimulation Parameters 1-10hz    Electrical Stimulation Goals Edema;Pain   Vasopneumatic   Number Minutes Vasopneumatic  15 minutes   Vasopnuematic Location  Knee   Vasopneumatic Pressure Low   Manual Therapy   Manual Therapy Passive ROM   Passive ROM PROM  into right knee flexion and extension                  PT Short Term Goals - 03/14/16 1056    PT SHORT TERM GOAL #1   Title Decreased edema in R knee to within 1.5 cm of L   Time 3   Period Weeks   Status Achieved  1.5 03/14/16           PT Long Term Goals - 03/28/16 1123    PT LONG TERM GOAL #1   Title I with HEP   Time 6   Period Weeks   Status On-going   PT LONG TERM GOAL #2   Title Improved R knee ROM -5 to 120 degrees to improve function   Time 6   Period Weeks   Status On-going  AROM -14-94 degrees 03/28/16   PT LONG TERM GOAL #3   Title Patient able to amb 300 feet safely without AD   Time 6   Period Weeks   Status On-going   PT LONG TERM GOAL #4   Title Able to climb stairs with reciprocal gait and 5/5 knee strength   Time 6   Period Weeks   Status On-going               Plan - 03/28/16 1124    Clinical Impression Statement Patient progressing with ROM overall yet is limited due to swelling and stiffness in right knee. Patient reports continued self stretches daily. Patient also is riding her stationary bike at home daily up to 4min at a time and has reported limitations increasing range due to left knee limitaton. Goals ongoing to to ROM, strength and edema deficits.   Rehab Potential Excellent   PT Frequency 3x / week   PT Duration 6 weeks   PT Treatment/Interventions ADLs/Self Care Home Management;Electrical Stimulation;Cryotherapy;Therapeutic exercise;Manual techniques;Vasopneumatic Device;Passive range of motion;Patient/family education;Gait  training;Stair training;Balance training;Neuromuscular re-education;Scar mobilization   PT Next Visit Plan Continue per TKR protocol with modalities PRN for pain. Assess R knee ROM next treatment.  Consulted and Agree with Plan of Care Patient      Patient will benefit from skilled therapeutic intervention in order to improve the following deficits and impairments:  Abnormal gait, Decreased range of motion, Pain, Decreased scar mobility, Decreased strength, Increased edema, Decreased mobility  Visit Diagnosis: Pain in right knee  Stiffness of right knee, not elsewhere classified  Generalized edema     Problem List Patient Active Problem List   Diagnosis Date Noted  . History of arthroplasty of right knee 03/05/2016  . OA (osteoarthritis) of knee 02/13/2016  . Leg edema 12/07/2015  . Preop cardiovascular exam 12/07/2015  . B12 deficiency 12/08/2014  . Metabolic syndrome [id number]  . Bradycardia 05/31/2014  . Hyperlipemia 04/28/2013  . Vitamin D deficiency 04/28/2013  . Lumbar spondylosis with myelopathy 04/28/2013  . Osteoarthritis 04/28/2013  . Hypertension 04/28/2013  . Incontinence of urine 04/28/2013  . Hypothyroidism 04/28/2013  . Cough 08/21/2011  . Abnormal CXR 08/21/2011    Phillips Climes, PTA 03/28/2016, 11:30 AM  [facility] [place], [place], [address] Phone: [phone]   Fax:  [phone]  Name: Miranda Wilson MRN: [id number] Date of Birth: [birth year]

## 2016-03-29 ENCOUNTER — Ambulatory Visit: Payer: BLUE CROSS/BLUE SHIELD

## 2016-03-30 ENCOUNTER — Ambulatory Visit: Payer: BLUE CROSS/BLUE SHIELD | Attending: Orthopedic Surgery | Admitting: *Deleted

## 2016-03-30 DIAGNOSIS — R601 Generalized edema: Secondary | ICD-10-CM | POA: Diagnosis present

## 2016-03-30 DIAGNOSIS — M25561 Pain in right knee: Secondary | ICD-10-CM | POA: Diagnosis present

## 2016-03-30 DIAGNOSIS — M25661 Stiffness of right knee, not elsewhere classified: Secondary | ICD-10-CM | POA: Insufficient documentation

## 2016-03-30 NOTE — Therapy (Signed)
[facility] [place], [place], [address] Phone: [phone]   Fax:  [phone]  Physical Therapy Treatment  Patient Details  Name: Miranda Wilson MRN: [id number] Date of Birth: Apr 12, 1951 Referring Provider: Gorden Harms, MD  Encounter Date: 03/30/2016      PT End of Session - 03/30/16 1129    Visit Number 14   Number of Visits 18   Date for PT Re-Evaluation 04/09/16   PT Start Time [id number]   PT Stop Time 1138   PT Time Calculation (min) 61 min      Past Medical History  Diagnosis Date  . Hyperlipidemia   . Hypertension   . IBS (irritable bowel syndrome)   . Varicose veins   . PMB (postmenopausal bleeding)   . History of idiopathic seizure     AGE 50 to 20  X5  --  UNKNOW IDIOLOGY , PER PT WAS ANEMIC AT THE TIME--  NONE SINCE  . S/P radioactive iodine thyroid ablation   . History of tachycardia     S/P  RADIOACTIVE IODINE ABLATION OF THYROID  . History of colon polyps   . Cervical spondylosis   . OA (osteoarthritis)     shoulders  left > right  . GERD (gastroesophageal reflux disease)   . Chronic constipation   . Urge urinary incontinence   . Bilateral edema of lower extremity     left > right --  wear compression hose  . Hypothyroidism, postradioiodine therapy     AGE 22  . At risk for sleep apnea   . Vein disorder     LEFT ANKLE VEIN VALVE REFLUX WITH DECREASED CIRCULATION     Past Surgical History  Procedure Laterality Date  . Varicose vein surgery  1998 to 2010    includes laser and phlebectomies  . Cesarean section  1979  . Endovenous ablation saphenous vein w/ laser  04/2007  . Tonsillectomy  1955  . Colonoscopy w/ polypectomy  10-25-2008  . Total knee arthroplasty Left 12-19-2009  . Closed left knee manipulation and right knee cortisone injection  02-20-2010  . Left knee arthrotomy w/ lysis adhesions  12-01-2010  . Laparoscopic cholecystectomy  1993    and BILATERAL TUBAL LIGATION  .  Cataract extraction w/ intraocular lens  implant, bilateral  right 2011//  left 2013  . Hysteroscopy w/d&c N/A 11/11/2014    Procedure: DILATATION AND CURETTAGE /HYSTEROSCOPY;  Surgeon: Gus Height, MD;  Location: [facility];  Service: Gynecology;  Laterality: N/A;  . Eye surgery    . Dilation and curettage of uterus    . Joint replacement    . Total knee arthroplasty Right 02/13/2016    Procedure: RIGHT TOTAL KNEE ARTHROPLASTY;  Surgeon: Gaynelle Arabian, MD;  Location: [facility];  Service: Orthopedics;  Laterality: Right;    There were no vitals filed for this visit.      Subjective Assessment - 03/30/16 1053    Subjective Patient has reported little soreness today yet ongoing swelling in right knee   Pertinent History HTN, venous insuffieciency, back problems, disc problems, L TKR 2011   Patient Stated Goals walk and stand with good balance/comfort; regain ROM   Pain Onset More than a month ago            [facility] PT Assessment - 03/30/16 0001    PROM   Overall PROM  Deficits   PROM Assessment Site Knee   Right/Left Knee Right   Right Knee Extension -  8   Right Knee Flexion 105                     [facility] Adult PT Treatment/Exercise - 03/30/16 0001    Exercises   Exercises Knee/Hip   Knee/Hip Exercises: Stretches   Knee: Self-Stretch to increase Flexion Right;3 reps;30 seconds   Knee/Hip Exercises: Aerobic   Nustep 40min L5 adjusted for ROM UE/Right LE   Knee/Hip Exercises: Standing   Forward Lunges Right;2 sets;10 reps;3 seconds;5 seconds;2 seconds  hold 10 secs   Forward Step Up Right;3 sets;10 reps;Step Height: 6"   Rocker Board 5 minutes  calf stretching   Modalities   Modalities Designer, multimedia Location R knee   Electrical Stimulation Action IFC   Electrical Stimulation Parameters 1-10hz  x 15 mins   Electrical Stimulation Goals Edema;Pain   Vasopneumatic   Number Minutes  Vasopneumatic  15 minutes   Vasopnuematic Location  Knee   Vasopneumatic Pressure Low   Vasopneumatic Temperature  34   Manual Therapy   Manual Therapy Passive ROM   Passive ROM PROM  into right knee flexion and extension                  PT Short Term Goals - 03/14/16 1056    PT SHORT TERM GOAL #1   Title Decreased edema in R knee to within 1.5 cm of L   Time 3   Period Weeks   Status Achieved  1.5 03/14/16           PT Long Term Goals - 03/28/16 1123    PT LONG TERM GOAL #1   Title I with HEP   Time 6   Period Weeks   Status On-going   PT LONG TERM GOAL #2   Title Improved R knee ROM -5 to 120 degrees to improve function   Time 6   Period Weeks   Status On-going  AROM -14-94 degrees 03/28/16   PT LONG TERM GOAL #3   Title Patient able to amb 300 feet safely without AD   Time 6   Period Weeks   Status On-going   PT LONG TERM GOAL #4   Title Able to climb stairs with reciprocal gait and 5/5 knee strength   Time 6   Period Weeks   Status On-going               Plan - 03/30/16 1133    Clinical Impression Statement Pt did fairly well with Rx today and was able to perform all exs and acts for RT LE.  Her ROM is about the same, today -8 to 105 degrees. She continues to feel more comfortable with gait now and only takes [facility] if she needs it. Goals ongoing   Rehab Potential Excellent   PT Frequency 3x / week   PT Duration 6 weeks   PT Treatment/Interventions ADLs/Self Care Home Management;Electrical Stimulation;Cryotherapy;Therapeutic exercise;Manual techniques;Vasopneumatic Device;Passive range of motion;Patient/family education;Gait training;Stair training;Balance training;Neuromuscular re-education;Scar mobilization   PT Next Visit Plan Continue per TKR protocol with modalities PRN for pain. Assess R knee ROM next treatment.   Consulted and Agree with Plan of Care Patient      Patient will benefit from skilled therapeutic intervention in order to  improve the following deficits and impairments:  Abnormal gait, Decreased range of motion, Pain, Decreased scar mobility, Decreased strength, Increased edema, Decreased mobility  Visit Diagnosis: Pain in right knee  Stiffness of  right knee, not elsewhere classified  Generalized edema     Problem List Patient Active Problem List   Diagnosis Date Noted  . History of arthroplasty of right knee 03/05/2016  . OA (osteoarthritis) of knee 02/13/2016  . Leg edema 12/07/2015  . Preop cardiovascular exam 12/07/2015  . B12 deficiency 12/08/2014  . Metabolic syndrome [id number]  . Bradycardia 05/31/2014  . Hyperlipemia 04/28/2013  . Vitamin D deficiency 04/28/2013  . Lumbar spondylosis with myelopathy 04/28/2013  . Osteoarthritis 04/28/2013  . Hypertension 04/28/2013  . Incontinence of urine 04/28/2013  . Hypothyroidism 04/28/2013  . Cough 08/21/2011  . Abnormal CXR 08/21/2011    Icess Bertoni,CHRIS, PTA 03/30/2016, 12:05 PM  [facility] [address] [place], [place], [address] Phone: [phone]   Fax:  [phone]  Name: Miranda Wilson MRN: [id number] Date of Birth: 29-Apr-1951

## 2016-04-02 ENCOUNTER — Ambulatory Visit: Payer: BLUE CROSS/BLUE SHIELD | Admitting: Physical Therapy

## 2016-04-02 ENCOUNTER — Encounter: Payer: Self-pay | Admitting: Physical Therapy

## 2016-04-02 DIAGNOSIS — M25561 Pain in right knee: Secondary | ICD-10-CM | POA: Diagnosis not present

## 2016-04-02 DIAGNOSIS — R601 Generalized edema: Secondary | ICD-10-CM

## 2016-04-02 DIAGNOSIS — M25661 Stiffness of right knee, not elsewhere classified: Secondary | ICD-10-CM

## 2016-04-02 NOTE — Therapy (Signed)
[facility] [place], [place], [address] Phone: [phone]   Fax:  [phone]  Physical Therapy Treatment  Patient Details  Name: Miranda Wilson MRN: [id number] Date of Birth: 02/18/1951 Referring Provider: Gorden Harms, MD  Encounter Date: 04/02/2016      PT End of Session - 04/02/16 1035    Visit Number 15   Number of Visits 18   Date for PT Re-Evaluation 04/09/16   PT Start Time 1033   PT Stop Time 1131   PT Time Calculation (min) 58 min   Activity Tolerance Patient tolerated treatment well   Behavior During Therapy [facility] for tasks assessed/performed      Past Medical History  Diagnosis Date  . Hyperlipidemia   . Hypertension   . IBS (irritable bowel syndrome)   . Varicose veins   . PMB (postmenopausal bleeding)   . History of idiopathic seizure     AGE 54 to 20  X5  --  UNKNOW IDIOLOGY , PER PT WAS ANEMIC AT THE TIME--  NONE SINCE  . S/P radioactive iodine thyroid ablation   . History of tachycardia     S/P  RADIOACTIVE IODINE ABLATION OF THYROID  . History of colon polyps   . Cervical spondylosis   . OA (osteoarthritis)     shoulders  left > right  . GERD (gastroesophageal reflux disease)   . Chronic constipation   . Urge urinary incontinence   . Bilateral edema of lower extremity     left > right --  wear compression hose  . Hypothyroidism, postradioiodine therapy     AGE 75  . At risk for sleep apnea   . Vein disorder     LEFT ANKLE VEIN VALVE REFLUX WITH DECREASED CIRCULATION     Past Surgical History  Procedure Laterality Date  . Varicose vein surgery  1998 to 2010    includes laser and phlebectomies  . Cesarean section  1979  . Endovenous ablation saphenous vein w/ laser  04/2007  . Tonsillectomy  1955  . Colonoscopy w/ polypectomy  10-25-2008  . Total knee arthroplasty Left 12-19-2009  . Closed left knee manipulation and right knee cortisone injection  02-20-2010  . Left knee  arthrotomy w/ lysis adhesions  12-01-2010  . Laparoscopic cholecystectomy  1993    and BILATERAL TUBAL LIGATION  . Cataract extraction w/ intraocular lens  implant, bilateral  right 2011//  left 2013  . Hysteroscopy w/d&c N/A 11/11/2014    Procedure: DILATATION AND CURETTAGE /HYSTEROSCOPY;  Surgeon: Gus Height, MD;  Location: [facility];  Service: Gynecology;  Laterality: N/A;  . Eye surgery    . Dilation and curettage of uterus    . Joint replacement    . Total knee arthroplasty Right 02/13/2016    Procedure: RIGHT TOTAL KNEE ARTHROPLASTY;  Surgeon: Gaynelle Arabian, MD;  Location: [facility];  Service: Orthopedics;  Laterality: Right;    There were no vitals filed for this visit.      Subjective Assessment - 04/02/16 1033    Subjective Reports R knee is sore and stiff this morning but no real pain. Had to take muscle relaxer last night in order to sleep.   Pertinent History HTN, venous insuffieciency, back problems, disc problems, L TKR 2011   Patient Stated Goals walk and stand with good balance/comfort; regain ROM   Currently in Pain? No/denies            [facility] PT Assessment - 04/02/16 0001  Assessment   Medical Diagnosis S/P R TKR   Onset Date/Surgical Date 02/13/16   Precautions   Precautions Knee                     [facility] Adult PT Treatment/Exercise - 04/02/16 0001    Knee/Hip Exercises: Aerobic   Nustep 33min L5 adjusted for ROM UE/Right LE   Knee/Hip Exercises: Standing   Forward Lunges Right;2 sets;10 reps;3 seconds  12" step   Forward Step Up Right;3 sets;10 reps;Step Height: 6"   Rocker Board 3 minutes   Knee/Hip Exercises: Seated   Long Arc Quad Strengthening;Right;Weights;3 sets;10 reps   Long Arc Quad Weight 4 lbs.   Modalities   Modalities Associate Professor IFC   Electrical Stimulation Parameters 1-10 hz x15 min    Electrical Stimulation Goals Edema;Pain   Vasopneumatic   Number Minutes Vasopneumatic  15 minutes   Vasopnuematic Location  Knee   Vasopneumatic Pressure Low   Vasopneumatic Temperature  46   Manual Therapy   Manual Therapy Passive ROM   Passive ROM PROM of R knee into flexion in sitting with contract/relax to improve R knee flexion in sitting                  PT Short Term Goals - 03/14/16 1056    PT SHORT TERM GOAL #1   Title Decreased edema in R knee to within 1.5 cm of L   Time 3   Period Weeks   Status Achieved  1.5 03/14/16           PT Long Term Goals - 04/02/16 1123    PT LONG TERM GOAL #1   Title I with HEP   Time 6   Period Weeks   Status Achieved   PT LONG TERM GOAL #2   Title Improved R knee ROM -5 to 120 degrees to improve function   Time 6   Period Weeks   Status On-going  AROM -14-94 degrees 03/28/16   PT LONG TERM GOAL #3   Title Patient able to amb 300 feet safely without AD   Time 6   Period Weeks   Status On-going   PT LONG TERM GOAL #4   Title Able to climb stairs with reciprocal gait and 5/5 knee strength   Time 6   Period Weeks   Status On-going               Plan - 04/02/16 1118    Clinical Impression Statement Patient tolerated today's treatment fairly well as she arrived to treatment with increased R knee discomfort and tightness. Patient reported continued tightness with today's exercises such as lunges. Patient demonstrated only minimal R hip hike with forward step up upon observation. Patient's R knee ROM improved upon use of contract/relax in sitting. Patient presented with increased edema in RLE today upon obseration after removal of stocking. Normal modalities response noted following removal of the modalities. Reported R posteriomedial knee discomfort following today's treatment.   Rehab Potential Excellent   PT Frequency 3x / week   PT Duration 6 weeks   PT Treatment/Interventions ADLs/Self Care Home  Management;Electrical Stimulation;Cryotherapy;Therapeutic exercise;Manual techniques;Vasopneumatic Device;Passive range of motion;Patient/family education;Gait training;Stair training;Balance training;Neuromuscular re-education;Scar mobilization   PT Next Visit Plan Continue per TKR protocol with modalities PRN for pain.    Consulted and Agree with Plan of Care Patient  Patient will benefit from skilled therapeutic intervention in order to improve the following deficits and impairments:  Abnormal gait, Decreased range of motion, Pain, Decreased scar mobility, Decreased strength, Increased edema, Decreased mobility  Visit Diagnosis: Pain in right knee - Plan: PT plan of care cert/re-cert  Stiffness of right knee, not elsewhere classified - Plan: PT plan of care cert/re-cert  Generalized edema - Plan: PT plan of care cert/re-cert     Problem List Patient Active Problem List   Diagnosis Date Noted  . History of arthroplasty of right knee 03/05/2016  . OA (osteoarthritis) of knee 02/13/2016  . Leg edema 12/07/2015  . Preop cardiovascular exam 12/07/2015  . B12 deficiency 12/08/2014  . Metabolic syndrome [id number]  . Bradycardia 05/31/2014  . Hyperlipemia 04/28/2013  . Vitamin D deficiency 04/28/2013  . Lumbar spondylosis with myelopathy 04/28/2013  . Osteoarthritis 04/28/2013  . Hypertension 04/28/2013  . Incontinence of urine 04/28/2013  . Hypothyroidism 04/28/2013  . Cough 08/21/2011  . Abnormal CXR 08/21/2011   Ahmed Prima, PTA 04/02/2016 5:42 PM Mali Applegate MPT [facility] [address] [place], [place], [address] Phone: [phone]   Fax:  [phone]  Name: Miranda Wilson MRN: [id number] Date of Birth: Feb 18, 1951

## 2016-04-04 ENCOUNTER — Ambulatory Visit: Payer: BLUE CROSS/BLUE SHIELD | Admitting: Physical Therapy

## 2016-04-04 ENCOUNTER — Encounter: Payer: Self-pay | Admitting: Physical Therapy

## 2016-04-04 DIAGNOSIS — R601 Generalized edema: Secondary | ICD-10-CM

## 2016-04-04 DIAGNOSIS — M25561 Pain in right knee: Secondary | ICD-10-CM

## 2016-04-04 DIAGNOSIS — M25661 Stiffness of right knee, not elsewhere classified: Secondary | ICD-10-CM

## 2016-04-04 NOTE — Therapy (Signed)
[facility] [place], [place], [address] Phone: [phone]   Fax:  [phone]  Physical Therapy Treatment  Patient Details  Name: Miranda Wilson MRN: [id number] Date of Birth: 1951/09/26 Referring Provider: Gorden Harms, MD  Encounter Date: 04/04/2016      PT End of Session - 04/04/16 1106    Visit Number 16   Number of Visits 27   Date for PT Re-Evaluation 05/02/16  per MD   PT Start Time 1030   PT Stop Time 1129   PT Time Calculation (min) 59 min   Activity Tolerance Patient tolerated treatment well   Behavior During Therapy [facility] for tasks assessed/performed      Past Medical History  Diagnosis Date  . Hyperlipidemia   . Hypertension   . IBS (irritable bowel syndrome)   . Varicose veins   . PMB (postmenopausal bleeding)   . History of idiopathic seizure     AGE 37 to 20  X5  --  UNKNOW IDIOLOGY , PER PT WAS ANEMIC AT THE TIME--  NONE SINCE  . S/P radioactive iodine thyroid ablation   . History of tachycardia     S/P  RADIOACTIVE IODINE ABLATION OF THYROID  . History of colon polyps   . Cervical spondylosis   . OA (osteoarthritis)     shoulders  left > right  . GERD (gastroesophageal reflux disease)   . Chronic constipation   . Urge urinary incontinence   . Bilateral edema of lower extremity     left > right --  wear compression hose  . Hypothyroidism, postradioiodine therapy     AGE 47  . At risk for sleep apnea   . Vein disorder     LEFT ANKLE VEIN VALVE REFLUX WITH DECREASED CIRCULATION     Past Surgical History  Procedure Laterality Date  . Varicose vein surgery  1998 to 2010    includes laser and phlebectomies  . Cesarean section  1979  . Endovenous ablation saphenous vein w/ laser  04/2007  . Tonsillectomy  1955  . Colonoscopy w/ polypectomy  10-25-2008  . Total knee arthroplasty Left 12-19-2009  . Closed left knee manipulation and right knee cortisone injection  02-20-2010  . Left knee  arthrotomy w/ lysis adhesions  12-01-2010  . Laparoscopic cholecystectomy  1993    and BILATERAL TUBAL LIGATION  . Cataract extraction w/ intraocular lens  implant, bilateral  right 2011//  left 2013  . Hysteroscopy w/d&c N/A 11/11/2014    Procedure: DILATATION AND CURETTAGE /HYSTEROSCOPY;  Surgeon: Gus Height, MD;  Location: [facility];  Service: Gynecology;  Laterality: N/A;  . Eye surgery    . Dilation and curettage of uterus    . Joint replacement    . Total knee arthroplasty Right 02/13/2016    Procedure: RIGHT TOTAL KNEE ARTHROPLASTY;  Surgeon: Gaynelle Arabian, MD;  Location: [facility];  Service: Orthopedics;  Laterality: Right;    There were no vitals filed for this visit.      Subjective Assessment - 04/04/16 1046    Subjective Patient reports some ongoing swelling and stiffness in knee, and continues to self stretch at home   Pertinent History HTN, venous insuffieciency, back problems, disc problems, L TKR 2011   Patient Stated Goals walk and stand with good balance/comfort; regain ROM   Currently in Pain? Yes   Pain Score 3    Pain Location Knee   Pain Orientation Right   Pain Descriptors / Indicators  Sore   Pain Type Surgical pain   Pain Onset More than a month ago   Pain Frequency Intermittent   Aggravating Factors  ROM   Pain Relieving Factors rest            [facility] PT Assessment - 04/04/16 0001    AROM   Overall AROM  Deficits   AROM Assessment Site Knee   Right/Left Knee Right   Right Knee Extension -15   Right Knee Flexion 94   PROM   Overall PROM  Deficits   Right/Left Knee Right   Right Knee Extension -10   Right Knee Flexion 105                     [facility] Adult PT Treatment/Exercise - 04/04/16 0001    Knee/Hip Exercises: Stretches   Knee: Self-Stretch to increase Flexion Right;3 reps;30 seconds   Knee/Hip Exercises: Aerobic   Nustep 60min L5 adjusted for ROM UE/Right LE   Knee/Hip Exercises: Standing   Rocker Board 2  minutes   Acupuncturist Location R knee   Electrical Stimulation Action IFC   Electrical Stimulation Parameters 1-10hz    Electrical Stimulation Goals Edema;Pain   Vasopneumatic   Number Minutes Vasopneumatic  15 minutes   Vasopnuematic Location  Knee   Vasopneumatic Pressure Low   Manual Therapy   Manual Therapy Passive ROM   Passive ROM PROM of R knee into flexion in sitting with contract/relax to improve R knee flexion in sitting                  PT Short Term Goals - 03/14/16 1056    PT SHORT TERM GOAL #1   Title Decreased edema in R knee to within 1.5 cm of L   Time 3   Period Weeks   Status Achieved  1.5 03/14/16           PT Long Term Goals - 04/02/16 1123    PT LONG TERM GOAL #1   Title I with HEP   Time 6   Period Weeks   Status Achieved   PT LONG TERM GOAL #2   Title Improved R knee ROM -5 to 120 degrees to improve function   Time 6   Period Weeks   Status On-going  AROM -14-94 degrees 03/28/16   PT LONG TERM GOAL #3   Title Patient able to amb 300 feet safely without AD   Time 6   Period Weeks   Status On-going   PT LONG TERM GOAL #4   Title Able to climb stairs with reciprocal gait and 5/5 knee strength   Time 6   Period Weeks   Status On-going               Plan - 04/04/16 1108    Clinical Impression Statement Patient progressing slowly with ROM today and has not improved measurements. Patient continues to have swelling and stiffness in right knee. Today focused on ROM to increase range and functional independence. Goals ongoing due to strength, pain and ROM deficits.   Rehab Potential Excellent   PT Frequency 3x / week   PT Duration 6 weeks   PT Treatment/Interventions ADLs/Self Care Home Management;Electrical Stimulation;Cryotherapy;Therapeutic exercise;Manual techniques;Vasopneumatic Device;Passive range of motion;Patient/family education;Gait training;Stair training;Balance  training;Neuromuscular re-education;Scar mobilization   PT Next Visit Plan Continue per TKR protocol with modalities PRN for pain.    Consulted and Agree with Plan of Care Patient  Patient will benefit from skilled therapeutic intervention in order to improve the following deficits and impairments:  Abnormal gait, Decreased range of motion, Pain, Decreased scar mobility, Decreased strength, Increased edema, Decreased mobility  Visit Diagnosis: Pain in right knee  Stiffness of right knee, not elsewhere classified  Generalized edema     Problem List Patient Active Problem List   Diagnosis Date Noted  . History of arthroplasty of right knee 03/05/2016  . OA (osteoarthritis) of knee 02/13/2016  . Leg edema 12/07/2015  . Preop cardiovascular exam 12/07/2015  . B12 deficiency 12/08/2014  . Metabolic syndrome [id number]  . Bradycardia 05/31/2014  . Hyperlipemia 04/28/2013  . Vitamin D deficiency 04/28/2013  . Lumbar spondylosis with myelopathy 04/28/2013  . Osteoarthritis 04/28/2013  . Hypertension 04/28/2013  . Incontinence of urine 04/28/2013  . Hypothyroidism 04/28/2013  . Cough 08/21/2011  . Abnormal CXR 08/21/2011    Phillips Climes, PTA 04/04/2016, 11:47 AM  [facility] [place], [place], [address] Phone: [phone]   Fax:  [phone]  Name: Miranda Wilson MRN: [id number] Date of Birth: 04-01-1951

## 2016-04-05 ENCOUNTER — Other Ambulatory Visit: Payer: BLUE CROSS/BLUE SHIELD

## 2016-04-05 ENCOUNTER — Ambulatory Visit (INDEPENDENT_AMBULATORY_CARE_PROVIDER_SITE_OTHER): Payer: BLUE CROSS/BLUE SHIELD | Admitting: *Deleted

## 2016-04-05 DIAGNOSIS — R7989 Other specified abnormal findings of blood chemistry: Secondary | ICD-10-CM

## 2016-04-05 DIAGNOSIS — R945 Abnormal results of liver function studies: Secondary | ICD-10-CM

## 2016-04-05 DIAGNOSIS — E538 Deficiency of other specified B group vitamins: Secondary | ICD-10-CM

## 2016-04-05 MED ORDER — CYANOCOBALAMIN 1000 MCG/ML IJ SOLN
1000.0000 ug | INTRAMUSCULAR | Status: AC
  Administered 2016-04-05 – 2017-01-10 (×9): 1000 ug via INTRAMUSCULAR

## 2016-04-05 NOTE — Patient Instructions (Signed)

## 2016-04-05 NOTE — Progress Notes (Signed)
Pt given B12 injection IM right deltoid and tolerated well. 

## 2016-04-06 ENCOUNTER — Ambulatory Visit: Payer: BLUE CROSS/BLUE SHIELD | Admitting: Physical Therapy

## 2016-04-06 DIAGNOSIS — R601 Generalized edema: Secondary | ICD-10-CM

## 2016-04-06 DIAGNOSIS — M25561 Pain in right knee: Secondary | ICD-10-CM

## 2016-04-06 DIAGNOSIS — M25661 Stiffness of right knee, not elsewhere classified: Secondary | ICD-10-CM

## 2016-04-06 LAB — HEPATIC FUNCTION PANEL
ALT: 9 IU/L (ref 0–32)
AST: 19 IU/L (ref 0–40)
Albumin: 4.3 g/dL (ref 3.6–4.8)
Alkaline Phosphatase: 121 IU/L — ABNORMAL HIGH (ref 39–117)
BILIRUBIN, DIRECT: 0.21 mg/dL (ref 0.00–0.40)
Bilirubin Total: 0.4 mg/dL (ref 0.0–1.2)
Total Protein: 6.4 g/dL (ref 6.0–8.5)

## 2016-04-06 LAB — THYROID PANEL WITH TSH
FREE THYROXINE INDEX: 2.6 (ref 1.2–4.9)
T3 UPTAKE RATIO: 25 % (ref 24–39)
T4, Total: 10.5 ug/dL (ref 4.5–12.0)
TSH: 0.433 u[IU]/mL — AB (ref 0.450–4.500)

## 2016-04-06 NOTE — Therapy (Signed)
[facility] [place], [place], [address] Phone: [phone]   Fax:  [phone]  Physical Therapy Treatment  Patient Details  Name: Miranda Wilson MRN: [id number] Date of Birth: [birth year] Referring Provider: Gorden Harms, MD  Encounter Date: 04/06/2016      PT End of Session - 04/06/16 [id number]    Visit Number 17   Number of Visits 27   Date for PT Re-Evaluation 05/02/16   PT Start Time 1030   PT Stop Time 1124   PT Time Calculation (min) 54 min      Past Medical History  Diagnosis Date  . Hyperlipidemia   . Hypertension   . IBS (irritable bowel syndrome)   . Varicose veins   . PMB (postmenopausal bleeding)   . History of idiopathic seizure     AGE 65 to 20  X5  --  UNKNOW IDIOLOGY , PER PT WAS ANEMIC AT THE TIME--  NONE SINCE  . S/P radioactive iodine thyroid ablation   . History of tachycardia     S/P  RADIOACTIVE IODINE ABLATION OF THYROID  . History of colon polyps   . Cervical spondylosis   . OA (osteoarthritis)     shoulders  left > right  . GERD (gastroesophageal reflux disease)   . Chronic constipation   . Urge urinary incontinence   . Bilateral edema of lower extremity     left > right --  wear compression hose  . Hypothyroidism, postradioiodine therapy     AGE 65  . At risk for sleep apnea   . Vein disorder     LEFT ANKLE VEIN VALVE REFLUX WITH DECREASED CIRCULATION     Past Surgical History  Procedure Laterality Date  . Varicose vein surgery  1998 to 2010    includes laser and phlebectomies  . Cesarean section  1979  . Endovenous ablation saphenous vein w/ laser  04/2007  . Tonsillectomy  1955  . Colonoscopy w/ polypectomy  10-25-2008  . Total knee arthroplasty Left 12-19-2009  . Closed left knee manipulation and right knee cortisone injection  02-20-2010  . Left knee arthrotomy w/ lysis adhesions  12-01-2010  . Laparoscopic cholecystectomy  1993    and BILATERAL TUBAL LIGATION  .  Cataract extraction w/ intraocular lens  implant, bilateral  right 2011//  left 2013  . Hysteroscopy w/d&c N/A 11/11/2014    Procedure: DILATATION AND CURETTAGE /HYSTEROSCOPY;  Surgeon: Gus Height, MD;  Location: [facility];  Service: Gynecology;  Laterality: N/A;  . Eye surgery    . Dilation and curettage of uterus    . Joint replacement    . Total knee arthroplasty Right 02/13/2016    Procedure: RIGHT TOTAL KNEE ARTHROPLASTY;  Surgeon: Gaynelle Arabian, MD;  Location: [facility];  Service: Orthopedics;  Laterality: Right;    There were no vitals filed for this visit.      Subjective Assessment - 04/06/16 1206    Subjective Doing okay today.   Patient Stated Goals walk and stand with good balance/comfort; regain ROM   Pain Score 4    Pain Location Knee   Pain Orientation Right   Pain Descriptors / Indicators Sore   Pain Type Surgical pain   Pain Onset More than a month ago                                   PT Short Term  Goals - 03/14/16 1056    PT SHORT TERM GOAL #1   Title Decreased edema in R knee to within 1.5 cm of L   Time 3   Period Weeks   Status Achieved  1.5 03/14/16           PT Long Term Goals - 04/02/16 1123    PT LONG TERM GOAL #1   Title I with HEP   Time 6   Period Weeks   Status Achieved   PT LONG TERM GOAL #2   Title Improved R knee ROM -5 to 120 degrees to improve function   Time 6   Period Weeks   Status On-going  AROM -14-94 degrees 03/28/16   PT LONG TERM GOAL #3   Title Patient able to amb 300 feet safely without AD   Time 6   Period Weeks   Status On-going   PT LONG TERM GOAL #4   Title Able to climb stairs with reciprocal gait and 5/5 knee strength   Time 6   Period Weeks   Status On-going             Patient will benefit from skilled therapeutic intervention in order to improve the following deficits and impairments:     Visit Diagnosis: Pain in right knee  Stiffness of right knee,  not elsewhere classified  Generalized edema     Problem List Patient Active Problem List   Diagnosis Date Noted  . History of arthroplasty of right knee 03/05/2016  . OA (osteoarthritis) of knee 02/13/2016  . Leg edema 12/07/2015  . Preop cardiovascular exam 12/07/2015  . B12 deficiency 12/08/2014  . Metabolic syndrome [id number]  . Bradycardia 05/31/2014  . Hyperlipemia 04/28/2013  . Vitamin D deficiency 04/28/2013  . Lumbar spondylosis with myelopathy 04/28/2013  . Osteoarthritis 04/28/2013  . Hypertension 04/28/2013  . Incontinence of urine 04/28/2013  . Hypothyroidism 04/28/2013  . Cough 08/21/2011  . Abnormal CXR 08/21/2011   Treatment:  Nustep x 16 minutes f/b 14" box lunges (4 minutes) f/b PROM into right knee flexion x 10 minutes (achieved 110 degrees of passive right knee flexion) f/b IFC and low pressure vasopneumatic x 15 minutes.   APPLEGATE, [place] MPT 04/06/2016, 12:10 PM  [facility] [address] [place], [place], [address] Phone: [phone]   Fax:  [phone]  Name: Miranda Wilson MRN: [id number] Date of Birth: 1951/06/02

## 2016-04-09 ENCOUNTER — Encounter: Payer: Self-pay | Admitting: Physical Therapy

## 2016-04-09 ENCOUNTER — Other Ambulatory Visit: Payer: Self-pay | Admitting: *Deleted

## 2016-04-09 ENCOUNTER — Ambulatory Visit: Payer: BLUE CROSS/BLUE SHIELD | Admitting: Physical Therapy

## 2016-04-09 DIAGNOSIS — M25561 Pain in right knee: Secondary | ICD-10-CM | POA: Diagnosis not present

## 2016-04-09 DIAGNOSIS — E039 Hypothyroidism, unspecified: Secondary | ICD-10-CM

## 2016-04-09 DIAGNOSIS — R601 Generalized edema: Secondary | ICD-10-CM

## 2016-04-09 DIAGNOSIS — M25661 Stiffness of right knee, not elsewhere classified: Secondary | ICD-10-CM

## 2016-04-09 NOTE — Therapy (Signed)
[facility] [place], [place], [address] Phone: [phone]   Fax:  [phone]  Physical Therapy Treatment  Patient Details  Name: Miranda Wilson MRN: [id number] Date of Birth: 06/08/1951 Referring Provider: Gorden Harms, MD  Encounter Date: 04/09/2016      PT End of Session - 04/09/16 1032    Visit Number 18   Number of Visits 27   Date for PT Re-Evaluation 05/02/16   PT Start Time 1033   PT Stop Time 1130   PT Time Calculation (min) 57 min   Activity Tolerance Patient tolerated treatment well   Behavior During Therapy [facility] for tasks assessed/performed      Past Medical History  Diagnosis Date  . Hyperlipidemia   . Hypertension   . IBS (irritable bowel syndrome)   . Varicose veins   . PMB (postmenopausal bleeding)   . History of idiopathic seizure     AGE 27 to 20  X5  --  UNKNOW IDIOLOGY , PER PT WAS ANEMIC AT THE TIME--  NONE SINCE  . S/P radioactive iodine thyroid ablation   . History of tachycardia     S/P  RADIOACTIVE IODINE ABLATION OF THYROID  . History of colon polyps   . Cervical spondylosis   . OA (osteoarthritis)     shoulders  left > right  . GERD (gastroesophageal reflux disease)   . Chronic constipation   . Urge urinary incontinence   . Bilateral edema of lower extremity     left > right --  wear compression hose  . Hypothyroidism, postradioiodine therapy     AGE 43  . At risk for sleep apnea   . Vein disorder     LEFT ANKLE VEIN VALVE REFLUX WITH DECREASED CIRCULATION     Past Surgical History  Procedure Laterality Date  . Varicose vein surgery  1998 to 2010    includes laser and phlebectomies  . Cesarean section  1979  . Endovenous ablation saphenous vein w/ laser  04/2007  . Tonsillectomy  1955  . Colonoscopy w/ polypectomy  10-25-2008  . Total knee arthroplasty Left 12-19-2009  . Closed left knee manipulation and right knee cortisone injection  02-20-2010  . Left knee  arthrotomy w/ lysis adhesions  12-01-2010  . Laparoscopic cholecystectomy  1993    and BILATERAL TUBAL LIGATION  . Cataract extraction w/ intraocular lens  implant, bilateral  right 2011//  left 2013  . Hysteroscopy w/d&c N/A 11/11/2014    Procedure: DILATATION AND CURETTAGE /HYSTEROSCOPY;  Surgeon: Gus Height, MD;  Location: [facility];  Service: Gynecology;  Laterality: N/A;  . Eye surgery    . Dilation and curettage of uterus    . Joint replacement    . Total knee arthroplasty Right 02/13/2016    Procedure: RIGHT TOTAL KNEE ARTHROPLASTY;  Surgeon: Gaynelle Arabian, MD;  Location: [facility];  Service: Orthopedics;  Laterality: Right;    There were no vitals filed for this visit.      Subjective Assessment - 04/09/16 1031    Subjective Reports R knee soreness today. Reports she rode the bike prior to treatment today.   Pertinent History HTN, venous insuffieciency, back problems, disc problems, L TKR 2011   Patient Stated Goals walk and stand with good balance/comfort; regain ROM   Currently in Pain? Yes   Pain Score 2    Pain Location Knee   Pain Orientation Right   Pain Descriptors / Indicators Sore;Tightness   Pain Type  Surgical pain   Pain Onset More than a month ago            [facility] PT Assessment - 04/09/16 0001    Assessment   Medical Diagnosis S/P R TKR   Onset Date/Surgical Date 02/13/16   Precautions   Precautions Knee                     [facility] Adult PT Treatment/Exercise - 04/09/16 0001    Knee/Hip Exercises: Aerobic   Nustep L5, seat 7 x15 min with UE/ RLE   Knee/Hip Exercises: Standing   Forward Lunges Right;2 sets;10 reps;3 seconds  12" step   Knee/Hip Exercises: Seated   Long Arc Quad Strengthening;Right;Weights;3 sets;10 reps   Long Arc Quad Weight 4 lbs.   Modalities   Modalities Associate Professor IFC    Electrical Stimulation Parameters 1-10 hz x15 min   Electrical Stimulation Goals Edema;Pain   Vasopneumatic   Number Minutes Vasopneumatic  15 minutes   Vasopnuematic Location  Knee   Vasopneumatic Pressure Low   Vasopneumatic Temperature  48   Manual Therapy   Manual Therapy Passive ROM;Soft tissue mobilization   Soft tissue mobilization R patellar and incision mobility to promote proper mobility   Passive ROM PROM of R knee into flexion in sitting with contract/relax to improve R knee flexion in sitting                  PT Short Term Goals - 03/14/16 1056    PT SHORT TERM GOAL #1   Title Decreased edema in R knee to within 1.5 cm of L   Time 3   Period Weeks   Status Achieved  1.5 03/14/16           PT Long Term Goals - 04/02/16 1123    PT LONG TERM GOAL #1   Title I with HEP   Time 6   Period Weeks   Status Achieved   PT LONG TERM GOAL #2   Title Improved R knee ROM -5 to 120 degrees to improve function   Time 6   Period Weeks   Status On-going  AROM -14-94 degrees 03/28/16   PT LONG TERM GOAL #3   Title Patient able to amb 300 feet safely without AD   Time 6   Period Weeks   Status On-going   PT LONG TERM GOAL #4   Title Able to climb stairs with reciprocal gait and 5/5 knee strength   Time 6   Period Weeks   Status On-going               Plan - 04/09/16 1122    Clinical Impression Statement Patient presented in clinic with increased R knee tightness and soreness today. Patient reported trying to maintain hip immobility with seated contract/relax and reported tightness with seated contract/relax into flexion. Patient continues to present with slightly limited R patellar mobility and incision mobility. Incision puckering noted predominately in superior aspect of R knee incision. Normal modalities response noted following removal of the modalities.   Rehab Potential Excellent   PT Frequency 3x / week   PT Duration 6 weeks   PT  Treatment/Interventions ADLs/Self Care Home Management;Electrical Stimulation;Cryotherapy;Therapeutic exercise;Manual techniques;Vasopneumatic Device;Passive range of motion;Patient/family education;Gait training;Stair training;Balance training;Neuromuscular re-education;Scar mobilization   PT Next Visit Plan Continue per TKR protocol with modalities PRN for pain.  Consulted and Agree with Plan of Care Patient      Patient will benefit from skilled therapeutic intervention in order to improve the following deficits and impairments:  Abnormal gait, Decreased range of motion, Pain, Decreased scar mobility, Decreased strength, Increased edema, Decreased mobility  Visit Diagnosis: Pain in right knee  Stiffness of right knee, not elsewhere classified  Generalized edema     Problem List Patient Active Problem List   Diagnosis Date Noted  . History of arthroplasty of right knee 03/05/2016  . OA (osteoarthritis) of knee 02/13/2016  . Leg edema 12/07/2015  . Preop cardiovascular exam 12/07/2015  . B12 deficiency 12/08/2014  . Metabolic syndrome [id number]  . Bradycardia 05/31/2014  . Hyperlipemia 04/28/2013  . Vitamin D deficiency 04/28/2013  . Lumbar spondylosis with myelopathy 04/28/2013  . Osteoarthritis 04/28/2013  . Hypertension 04/28/2013  . Incontinence of urine 04/28/2013  . Hypothyroidism 04/28/2013  . Cough 08/21/2011  . Abnormal CXR 08/21/2011    Wynelle Fanny, PTA 04/09/2016, 11:33 AM  [facility] [address] [place], [place], [address] Phone: [phone]   Fax:  [phone]  Name: Miranda Wilson MRN: [id number] Date of Birth: 1951/07/10

## 2016-04-11 ENCOUNTER — Encounter: Payer: Self-pay | Admitting: Physical Therapy

## 2016-04-11 ENCOUNTER — Ambulatory Visit: Payer: BLUE CROSS/BLUE SHIELD | Admitting: Physical Therapy

## 2016-04-11 DIAGNOSIS — M25561 Pain in right knee: Secondary | ICD-10-CM

## 2016-04-11 DIAGNOSIS — M25661 Stiffness of right knee, not elsewhere classified: Secondary | ICD-10-CM

## 2016-04-11 DIAGNOSIS — R601 Generalized edema: Secondary | ICD-10-CM

## 2016-04-11 NOTE — Therapy (Signed)
[facility] [place], [place], [address] Phone: [phone]   Fax:  [phone]  Physical Therapy Treatment  Patient Details  Name: Miranda Wilson MRN: [id number] Date of Birth: 1951-07-20 Referring Provider: Gorden Harms, MD  Encounter Date: 04/11/2016      PT End of Session - 04/11/16 1042    Visit Number 19   Number of Visits 27   Date for PT Re-Evaluation 05/02/16   PT Start Time 1031   PT Stop Time 1123   PT Time Calculation (min) 52 min   Activity Tolerance Patient tolerated treatment well   Behavior During Therapy [facility] for tasks assessed/performed      Past Medical History  Diagnosis Date  . Hyperlipidemia   . Hypertension   . IBS (irritable bowel syndrome)   . Varicose veins   . PMB (postmenopausal bleeding)   . History of idiopathic seizure     AGE 65 to 20  X5  --  UNKNOW IDIOLOGY , PER PT WAS ANEMIC AT THE TIME--  NONE SINCE  . S/P radioactive iodine thyroid ablation   . History of tachycardia     S/P  RADIOACTIVE IODINE ABLATION OF THYROID  . History of colon polyps   . Cervical spondylosis   . OA (osteoarthritis)     shoulders  left > right  . GERD (gastroesophageal reflux disease)   . Chronic constipation   . Urge urinary incontinence   . Bilateral edema of lower extremity     left > right --  wear compression hose  . Hypothyroidism, postradioiodine therapy     AGE 11  . At risk for sleep apnea   . Vein disorder     LEFT ANKLE VEIN VALVE REFLUX WITH DECREASED CIRCULATION     Past Surgical History  Procedure Laterality Date  . Varicose vein surgery  1998 to 2010    includes laser and phlebectomies  . Cesarean section  1979  . Endovenous ablation saphenous vein w/ laser  04/2007  . Tonsillectomy  1955  . Colonoscopy w/ polypectomy  10-25-2008  . Total knee arthroplasty Left 12-19-2009  . Closed left knee manipulation and right knee cortisone injection  02-20-2010  . Left knee  arthrotomy w/ lysis adhesions  12-01-2010  . Laparoscopic cholecystectomy  1993    and BILATERAL TUBAL LIGATION  . Cataract extraction w/ intraocular lens  implant, bilateral  right 2011//  left 2013  . Hysteroscopy w/d&c N/A 11/11/2014    Procedure: DILATATION AND CURETTAGE /HYSTEROSCOPY;  Surgeon: Gus Height, MD;  Location: [facility];  Service: Gynecology;  Laterality: N/A;  . Eye surgery    . Dilation and curettage of uterus    . Joint replacement    . Total knee arthroplasty Right 02/13/2016    Procedure: RIGHT TOTAL KNEE ARTHROPLASTY;  Surgeon: Gaynelle Arabian, MD;  Location: [facility];  Service: Orthopedics;  Laterality: Right;    There were no vitals filed for this visit.      Subjective Assessment - 04/11/16 1034    Subjective Reports that she has had a rough 2 days and yesterday was her worst day since surgery. Reports the most strenuous activity she has done is mop her small kitchen but has been trying to get back in house cleaning.   Pertinent History HTN, venous insuffieciency, back problems, disc problems, L TKR 2011   Patient Stated Goals walk and stand with good balance/comfort; regain ROM   Currently in Pain? Yes  Pain Score 3    Pain Location Knee   Pain Orientation Right   Pain Descriptors / Indicators Sore;Tightness   Pain Type Surgical pain   Pain Onset More than a month ago            [facility] PT Assessment - 04/11/16 0001    Assessment   Medical Diagnosis S/P R TKR   Onset Date/Surgical Date 02/13/16   Next MD Visit 04/23/2016   Precautions   Precautions Knee                     [facility] Adult PT Treatment/Exercise - 04/11/16 0001    Knee/Hip Exercises: Aerobic   Nustep L5, seat 7 x15 min with UE/ RLE   Knee/Hip Exercises: Standing   Forward Lunges Right;2 sets;10 reps;3 seconds  12' step   Modalities   Modalities Designer, multimedia Location R knee    Electrical Stimulation Action IFC   Electrical Stimulation Parameters 1-10 hz x15 min   Electrical Stimulation Goals Edema;Pain   Vasopneumatic   Number Minutes Vasopneumatic  15 minutes   Vasopnuematic Location  Knee   Vasopneumatic Pressure Low   Vasopneumatic Temperature  34   Manual Therapy   Manual Therapy Passive ROM;Soft tissue mobilization   Soft tissue mobilization R patellar and incision mobility to promote proper mobility   Passive ROM PROM of R knee into flexion in sitting with contract/relax to improve R knee flexion in sitting; PROM into extension with gentle holds at end range in sitting                  PT Short Term Goals - 03/14/16 1056    PT SHORT TERM GOAL #1   Title Decreased edema in R knee to within 1.5 cm of L   Time 3   Period Weeks   Status Achieved  1.5 03/14/16           PT Long Term Goals - 04/02/16 1123    PT LONG TERM GOAL #1   Title I with HEP   Time 6   Period Weeks   Status Achieved   PT LONG TERM GOAL #2   Title Improved R knee ROM -5 to 120 degrees to improve function   Time 6   Period Weeks   Status On-going  AROM -14-94 degrees 03/28/16   PT LONG TERM GOAL #3   Title Patient able to amb 300 feet safely without AD   Time 6   Period Weeks   Status On-going   PT LONG TERM GOAL #4   Title Able to climb stairs with reciprocal gait and 5/5 knee strength   Time 6   Period Weeks   Status On-going               Plan - 04/11/16 1114    Clinical Impression Statement Patient presented in clinic with increased discomfort in R knee following a rough few days. Patient noted that lunges today were much easier than the lunges at home yesterday. Patient continues to be limited with R knee ROM and experiences discomfort at end range flexion in sitting. R patella mobility limited in sup/inf direction which may be related to deficit in R knee extension. Normal modalities response noted following removal of the modalities.   Rehab  Potential Excellent   PT Frequency 3x / week   PT Duration 6 weeks   PT Treatment/Interventions ADLs/Self Care Home Management;Electrical  Stimulation;Cryotherapy;Therapeutic exercise;Manual techniques;Vasopneumatic Device;Passive range of motion;Patient/family education;Gait training;Stair training;Balance training;Neuromuscular re-education;Scar mobilization   PT Next Visit Plan Continue per TKR protocol with modalities PRN for pain.    Consulted and Agree with Plan of Care Patient      Patient will benefit from skilled therapeutic intervention in order to improve the following deficits and impairments:  Abnormal gait, Decreased range of motion, Pain, Decreased scar mobility, Decreased strength, Increased edema, Decreased mobility  Visit Diagnosis: Pain in right knee  Stiffness of right knee, not elsewhere classified  Generalized edema     Problem List Patient Active Problem List   Diagnosis Date Noted  . History of arthroplasty of right knee 03/05/2016  . OA (osteoarthritis) of knee 02/13/2016  . Leg edema 12/07/2015  . Preop cardiovascular exam 12/07/2015  . B12 deficiency 12/08/2014  . Metabolic syndrome [id number]  . Bradycardia 05/31/2014  . Hyperlipemia 04/28/2013  . Vitamin D deficiency 04/28/2013  . Lumbar spondylosis with myelopathy 04/28/2013  . Osteoarthritis 04/28/2013  . Hypertension 04/28/2013  . Incontinence of urine 04/28/2013  . Hypothyroidism 04/28/2013  . Cough 08/21/2011  . Abnormal CXR 08/21/2011    Wynelle Fanny, PTA 04/11/2016, 11:29 AM  [facility] [address] [place], [place], [address] Phone: [phone]   Fax:  [phone]  Name: AURIEL DELTORO MRN: [id number] Date of Birth: [birth year]

## 2016-04-13 ENCOUNTER — Encounter: Payer: Self-pay | Admitting: Physical Therapy

## 2016-04-13 ENCOUNTER — Ambulatory Visit: Payer: BLUE CROSS/BLUE SHIELD | Admitting: Physical Therapy

## 2016-04-13 DIAGNOSIS — M25561 Pain in right knee: Secondary | ICD-10-CM

## 2016-04-13 DIAGNOSIS — R601 Generalized edema: Secondary | ICD-10-CM

## 2016-04-13 DIAGNOSIS — M25661 Stiffness of right knee, not elsewhere classified: Secondary | ICD-10-CM

## 2016-04-13 NOTE — Therapy (Signed)
[facility] [place], [place], [address] Phone: [phone]   Fax:  [phone]  Physical Therapy Treatment  Patient Details  Name: Miranda Wilson MRN: [id number] Date of Birth: [birth year] Referring Provider: Gorden Harms, MD  Encounter Date: 04/13/2016      PT End of Session - 04/13/16 1040    Visit Number 20   Number of Visits 27   Date for PT Re-Evaluation 05/02/16   PT Start Time 1033   PT Stop Time 1128   PT Time Calculation (min) 55 min   Activity Tolerance Patient tolerated treatment well   Behavior During Therapy [facility] for tasks assessed/performed      Past Medical History  Diagnosis Date  . Hyperlipidemia   . Hypertension   . IBS (irritable bowel syndrome)   . Varicose veins   . PMB (postmenopausal bleeding)   . History of idiopathic seizure     AGE 36 to 20  X5  --  UNKNOW IDIOLOGY , PER PT WAS ANEMIC AT THE TIME--  NONE SINCE  . S/P radioactive iodine thyroid ablation   . History of tachycardia     S/P  RADIOACTIVE IODINE ABLATION OF THYROID  . History of colon polyps   . Cervical spondylosis   . OA (osteoarthritis)     shoulders  left > right  . GERD (gastroesophageal reflux disease)   . Chronic constipation   . Urge urinary incontinence   . Bilateral edema of lower extremity     left > right --  wear compression hose  . Hypothyroidism, postradioiodine therapy     AGE 65  . At risk for sleep apnea   . Vein disorder     LEFT ANKLE VEIN VALVE REFLUX WITH DECREASED CIRCULATION     Past Surgical History  Procedure Laterality Date  . Varicose vein surgery  1998 to 2010    includes laser and phlebectomies  . Cesarean section  1979  . Endovenous ablation saphenous vein w/ laser  04/2007  . Tonsillectomy  1955  . Colonoscopy w/ polypectomy  10-25-2008  . Total knee arthroplasty Left 12-19-2009  . Closed left knee manipulation and right knee cortisone injection  02-20-2010  . Left knee  arthrotomy w/ lysis adhesions  12-01-2010  . Laparoscopic cholecystectomy  1993    and BILATERAL TUBAL LIGATION  . Cataract extraction w/ intraocular lens  implant, bilateral  right 2011//  left 2013  . Hysteroscopy w/d&c N/A 11/11/2014    Procedure: DILATATION AND CURETTAGE /HYSTEROSCOPY;  Surgeon: Gus Height, MD;  Location: [facility];  Service: Gynecology;  Laterality: N/A;  . Eye surgery    . Dilation and curettage of uterus    . Joint replacement    . Total knee arthroplasty Right 02/13/2016    Procedure: RIGHT TOTAL KNEE ARTHROPLASTY;  Surgeon: Gaynelle Arabian, MD;  Location: [facility];  Service: Orthopedics;  Laterality: Right;    There were no vitals filed for this visit.      Subjective Assessment - 04/13/16 1040    Subjective Reports that yesterday was a bad day and wonders if she does too much. Reports that she has been able to ride 3/10th of mile on bike this week.   Pertinent History HTN, venous insuffieciency, back problems, disc problems, L TKR 2011   Patient Stated Goals walk and stand with good balance/comfort; regain ROM   Currently in Pain? Other (Comment)  Gave no numerical pain rating  [facility] PT Assessment - 04/13/16 0001    Assessment   Medical Diagnosis S/P R TKR   Onset Date/Surgical Date 02/13/16   Next MD Visit 04/23/2016   Precautions   Precautions Knee                     [facility] Adult PT Treatment/Exercise - 04/13/16 0001    Knee/Hip Exercises: Aerobic   Nustep L5, seat 7 x15 min with UE/ RLE   Knee/Hip Exercises: Standing   Forward Lunges Right;2 sets;10 reps;3 seconds  12" step   Modalities   Modalities Electrical Stimulation;Vasopneumatic   Acupuncturist Location R knee   Electrical Stimulation Action IFC   Electrical Stimulation Parameters 1-10 hz x15 min   Electrical Stimulation Goals Edema;Pain   Vasopneumatic   Number Minutes Vasopneumatic  15 minutes   Vasopnuematic  Location  Knee   Vasopneumatic Pressure Low   Vasopneumatic Temperature  60   Manual Therapy   Manual Therapy Passive ROM;Soft tissue mobilization   Soft tissue mobilization STW to R HS to decrease tightness in sitting   Passive ROM PROM of R knee into flex in sitting with contract/relax to improve R knee flexion in sitting; PROM into extension with gentle holds at end range in sitting                  PT Short Term Goals - 03/14/16 1056    PT SHORT TERM GOAL #1   Title Decreased edema in R knee to within 1.5 cm of L   Time 3   Period Weeks   Status Achieved  1.5 03/14/16           PT Long Term Goals - 04/02/16 1123    PT LONG TERM GOAL #1   Title I with HEP   Time 6   Period Weeks   Status Achieved   PT LONG TERM GOAL #2   Title Improved R knee ROM -5 to 120 degrees to improve function   Time 6   Period Weeks   Status On-going  AROM -14-94 degrees 03/28/16   PT LONG TERM GOAL #3   Title Patient able to amb 300 feet safely without AD   Time 6   Period Weeks   Status On-going   PT LONG TERM GOAL #4   Title Able to climb stairs with reciprocal gait and 5/5 knee strength   Time 6   Period Weeks   Status On-going               Plan - 04/13/16 1151    Clinical Impression Statement Patient presented in clinic with increased discomfort especially yesterday and patient worries she is doing too much. Patient now able to complete an increased distance on stationary bike at home. Patient had no reports of increased pain with any exercises todsay. Contract/relax in sitting and PROM in sitting were completed for ROM today with discomfort at end range flexion. Minimal to moderate tightness were noted to R HS today upon palpation. Normal modalities response noted following removal of the modalities.   Rehab Potential Excellent   PT Frequency 3x / week   PT Duration 6 weeks   PT Treatment/Interventions ADLs/Self Care Home Management;Electrical  Stimulation;Cryotherapy;Therapeutic exercise;Manual techniques;Vasopneumatic Device;Passive range of motion;Patient/family education;Gait training;Stair training;Balance training;Neuromuscular re-education;Scar mobilization   PT Next Visit Plan Continue per TKR protocol with modalities PRN for pain.    Consulted and Agree with Plan of Care Patient  Patient will benefit from skilled therapeutic intervention in order to improve the following deficits and impairments:  Abnormal gait, Decreased range of motion, Pain, Decreased scar mobility, Decreased strength, Increased edema, Decreased mobility  Visit Diagnosis: Pain in right knee  Stiffness of right knee, not elsewhere classified  Generalized edema     Problem List Patient Active Problem List   Diagnosis Date Noted  . History of arthroplasty of right knee 03/05/2016  . OA (osteoarthritis) of knee 02/13/2016  . Leg edema 12/07/2015  . Preop cardiovascular exam 12/07/2015  . B12 deficiency 12/08/2014  . Metabolic syndrome [id number]  . Bradycardia 05/31/2014  . Hyperlipemia 04/28/2013  . Vitamin D deficiency 04/28/2013  . Lumbar spondylosis with myelopathy 04/28/2013  . Osteoarthritis 04/28/2013  . Hypertension 04/28/2013  . Incontinence of urine 04/28/2013  . Hypothyroidism 04/28/2013  . Cough 08/21/2011  . Abnormal CXR 08/21/2011    Wynelle Fanny, PTA 04/13/2016, 12:02 PM  [facility] [address] [place], [place], [address] Phone: [phone]   Fax:  [phone]  Name: Miranda Wilson MRN: [id number] Date of Birth: 29-Mar-1951

## 2016-04-16 ENCOUNTER — Encounter: Payer: Self-pay | Admitting: Physical Therapy

## 2016-04-16 ENCOUNTER — Ambulatory Visit: Payer: BLUE CROSS/BLUE SHIELD | Admitting: Physical Therapy

## 2016-04-16 DIAGNOSIS — M25561 Pain in right knee: Secondary | ICD-10-CM | POA: Diagnosis not present

## 2016-04-16 DIAGNOSIS — R601 Generalized edema: Secondary | ICD-10-CM

## 2016-04-16 DIAGNOSIS — M25661 Stiffness of right knee, not elsewhere classified: Secondary | ICD-10-CM

## 2016-04-16 NOTE — Therapy (Signed)
[facility] [place], [place], [address] Phone: [phone]   Fax:  [phone]  Physical Therapy Treatment  Patient Details  Name: Miranda Wilson MRN: [id number] Date of Birth: 10-Aug-1951 Referring Provider: Gorden Harms, MD  Encounter Date: 04/16/2016      PT End of Session - 04/16/16 1117    Visit Number 21   Number of Visits 27   Date for PT Re-Evaluation 05/02/16   PT Start Time 1030   PT Stop Time 1131   PT Time Calculation (min) 61 min   Activity Tolerance Patient tolerated treatment well   Behavior During Therapy [facility] for tasks assessed/performed      Past Medical History  Diagnosis Date  . Hyperlipidemia   . Hypertension   . IBS (irritable bowel syndrome)   . Varicose veins   . PMB (postmenopausal bleeding)   . History of idiopathic seizure     AGE 65 to 65  X5  --  UNKNOW IDIOLOGY , PER PT WAS ANEMIC AT THE TIME--  NONE SINCE  . S/P radioactive iodine thyroid ablation   . History of tachycardia     S/P  RADIOACTIVE IODINE ABLATION OF THYROID  . History of colon polyps   . Cervical spondylosis   . OA (osteoarthritis)     shoulders  left > right  . GERD (gastroesophageal reflux disease)   . Chronic constipation   . Urge urinary incontinence   . Bilateral edema of lower extremity     left > right --  wear compression hose  . Hypothyroidism, postradioiodine therapy     AGE 65  . At risk for sleep apnea   . Vein disorder     LEFT ANKLE VEIN VALVE REFLUX WITH DECREASED CIRCULATION     Past Surgical History  Procedure Laterality Date  . Varicose vein surgery  1998 to 2010    includes laser and phlebectomies  . Cesarean section  1979  . Endovenous ablation saphenous vein w/ laser  04/2007  . Tonsillectomy  1955  . Colonoscopy w/ polypectomy  10-25-2008  . Total knee arthroplasty Left 12-19-2009  . Closed left knee manipulation and right knee cortisone injection  02-20-2010  . Left knee  arthrotomy w/ lysis adhesions  12-01-2010  . Laparoscopic cholecystectomy  1993    and BILATERAL TUBAL LIGATION  . Cataract extraction w/ intraocular lens  implant, bilateral  right 2011//  left 2013  . Hysteroscopy w/d&c N/A 11/11/2014    Procedure: DILATATION AND CURETTAGE /HYSTEROSCOPY;  Surgeon: Gus Height, MD;  Location: [facility];  Service: Gynecology;  Laterality: N/A;  . Eye surgery    . Dilation and curettage of uterus    . Joint replacement    . Total knee arthroplasty Right 02/13/2016    Procedure: RIGHT TOTAL KNEE ARTHROPLASTY;  Surgeon: Gaynelle Arabian, MD;  Location: [facility];  Service: Orthopedics;  Laterality: Right;    There were no vitals filed for this visit.      Subjective Assessment - 04/16/16 1034    Subjective Patient reported having a good weekend with less pain and soreness in knee   Pertinent History HTN, venous insuffieciency, back problems, disc problems, L TKR 2011   Patient Stated Goals walk and stand with good balance/comfort; regain ROM   Currently in Pain? Yes   Pain Score 2    Pain Location Knee   Pain Orientation Right   Pain Descriptors / Indicators Sore;Tightness   Pain Type Surgical  pain   Pain Onset More than a month ago   Pain Frequency Intermittent   Aggravating Factors  stretching and ROM in knee   Pain Relieving Factors ice and at rest            [facility] PT Assessment - 04/16/16 0001    AROM   Overall AROM  Deficits   AROM Assessment Site Knee   Right Knee Extension -15   Right Knee Flexion 98   PROM   Overall PROM  Deficits   Right/Left Knee Right   Right Knee Extension -10   Right Knee Flexion 106                     [facility] Adult PT Treatment/Exercise - 04/16/16 0001    Knee/Hip Exercises: Stretches   Knee: Self-Stretch to increase Flexion Right;3 reps;30 seconds   Knee/Hip Exercises: Aerobic   Nustep L5, seat 7 x15 min with UE/ RLE   Knee/Hip Exercises: Standing   Rocker Board 2 minutes    Acupuncturist Location R knee   Electrical Stimulation Action IFC   Electrical Stimulation Parameters 1-10hz    Electrical Stimulation Goals Edema;Pain   Vasopneumatic   Number Minutes Vasopneumatic  15 minutes   Vasopnuematic Location  Knee   Vasopneumatic Pressure Low   Manual Therapy   Manual Therapy Passive ROM;Soft tissue mobilization   Passive ROM PROM of R knee into flexion and extension with gentle holds at end range                   PT Short Term Goals - 03/14/16 1056    PT SHORT TERM GOAL #1   Title Decreased edema in R knee to within 1.5 cm of L   Time 3   Period Weeks   Status Achieved  1.5 03/14/16           PT Long Term Goals - 04/02/16 1123    PT LONG TERM GOAL #1   Title I with HEP   Time 6   Period Weeks   Status Achieved   PT LONG TERM GOAL #2   Title Improved R knee ROM -5 to 120 degrees to improve function   Time 6   Period Weeks   Status On-going  AROM -14-94 degrees 03/28/16   PT LONG TERM GOAL #3   Title Patient able to amb 300 feet safely without AD   Time 6   Period Weeks   Status On-going   PT LONG TERM GOAL #4   Title Able to climb stairs with reciprocal gait and 5/5 knee strength   Time 6   Period Weeks   Status On-going               Plan - 04/16/16 1118    Clinical Impression Statement Patient progressing with less pain over weekend and today. Patient was able to improve right knee flexion ROM with manual stretching. Patient has reported increased speed and time on her bike at home. Goals ongoing due to pain, edema, strength and ROM deficits.   Rehab Potential Excellent   PT Frequency 3x / week   PT Duration 6 weeks   PT Treatment/Interventions ADLs/Self Care Home Management;Electrical Stimulation;Cryotherapy;Therapeutic exercise;Manual techniques;Vasopneumatic Device;Passive range of motion;Patient/family education;Gait training;Stair training;Balance training;Neuromuscular  re-education;Scar mobilization   PT Next Visit Plan Continue per TKR protocol with modalities PRN for pain. (MD. Wynelle Link 04/24/16)   Consulted and Agree with Plan of Care Patient  Patient will benefit from skilled therapeutic intervention in order to improve the following deficits and impairments:  Abnormal gait, Decreased range of motion, Pain, Decreased scar mobility, Decreased strength, Increased edema, Decreased mobility  Visit Diagnosis: Pain in right knee  Stiffness of right knee, not elsewhere classified  Generalized edema     Problem List Patient Active Problem List   Diagnosis Date Noted  . History of arthroplasty of right knee 03/05/2016  . OA (osteoarthritis) of knee 02/13/2016  . Leg edema 12/07/2015  . Preop cardiovascular exam 12/07/2015  . B12 deficiency 12/08/2014  . Metabolic syndrome [id number]  . Bradycardia 05/31/2014  . Hyperlipemia 04/28/2013  . Vitamin D deficiency 04/28/2013  . Lumbar spondylosis with myelopathy 04/28/2013  . Osteoarthritis 04/28/2013  . Hypertension 04/28/2013  . Incontinence of urine 04/28/2013  . Hypothyroidism 04/28/2013  . Cough 08/21/2011  . Abnormal CXR 08/21/2011    Phillips Climes, PTA 04/16/2016, 11:32 AM  [facility] [place], [place], [address] Phone: [phone]   Fax:  [phone]  Name: Miranda Wilson MRN: [id number] Date of Birth: 1951/02/04

## 2016-04-16 NOTE — Therapy (Signed)
[facility] [place], [place], [address] Phone: [phone]   Fax:  [phone]  Physical Therapy Treatment  Patient Details  Name: Miranda Wilson MRN: [id number] Date of Birth: 1951-06-26 Referring Provider: Gorden Harms, MD  Encounter Date: 04/16/2016      PT End of Session - 04/16/16 1117    Visit Number 21   Number of Visits 27   Date for PT Re-Evaluation 05/02/16   PT Start Time 1030   PT Stop Time 1131   PT Time Calculation (min) 61 min   Activity Tolerance Patient tolerated treatment well   Behavior During Therapy [facility] for tasks assessed/performed      Past Medical History  Diagnosis Date  . Hyperlipidemia   . Hypertension   . IBS (irritable bowel syndrome)   . Varicose veins   . PMB (postmenopausal bleeding)   . History of idiopathic seizure     AGE 31 to 20  X5  --  UNKNOW IDIOLOGY , PER PT WAS ANEMIC AT THE TIME--  NONE SINCE  . S/P radioactive iodine thyroid ablation   . History of tachycardia     S/P  RADIOACTIVE IODINE ABLATION OF THYROID  . History of colon polyps   . Cervical spondylosis   . OA (osteoarthritis)     shoulders  left > right  . GERD (gastroesophageal reflux disease)   . Chronic constipation   . Urge urinary incontinence   . Bilateral edema of lower extremity     left > right --  wear compression hose  . Hypothyroidism, postradioiodine therapy     AGE 18  . At risk for sleep apnea   . Vein disorder     LEFT ANKLE VEIN VALVE REFLUX WITH DECREASED CIRCULATION     Past Surgical History  Procedure Laterality Date  . Varicose vein surgery  1998 to 2010    includes laser and phlebectomies  . Cesarean section  1979  . Endovenous ablation saphenous vein w/ laser  04/2007  . Tonsillectomy  1955  . Colonoscopy w/ polypectomy  10-25-2008  . Total knee arthroplasty Left 12-19-2009  . Closed left knee manipulation and right knee cortisone injection  02-20-2010  . Left knee  arthrotomy w/ lysis adhesions  12-01-2010  . Laparoscopic cholecystectomy  1993    and BILATERAL TUBAL LIGATION  . Cataract extraction w/ intraocular lens  implant, bilateral  right 2011//  left 2013  . Hysteroscopy w/d&c N/A 11/11/2014    Procedure: DILATATION AND CURETTAGE /HYSTEROSCOPY;  Surgeon: Gus Height, MD;  Location: [facility];  Service: Gynecology;  Laterality: N/A;  . Eye surgery    . Dilation and curettage of uterus    . Joint replacement    . Total knee arthroplasty Right 02/13/2016    Procedure: RIGHT TOTAL KNEE ARTHROPLASTY;  Surgeon: Gaynelle Arabian, MD;  Location: [facility];  Service: Orthopedics;  Laterality: Right;    There were no vitals filed for this visit.      Subjective Assessment - 04/16/16 1034    Subjective Patient reported having a good weekend with less pain and soreness in knee   Pertinent History HTN, venous insuffieciency, back problems, disc problems, L TKR 2011   Patient Stated Goals walk and stand with good balance/comfort; regain ROM   Currently in Pain? Yes   Pain Score 2    Pain Location Knee   Pain Orientation Right   Pain Descriptors / Indicators Sore;Tightness   Pain Type Surgical  pain   Pain Onset More than a month ago   Pain Frequency Intermittent   Aggravating Factors  stretching and ROM in knee   Pain Relieving Factors ice and at rest            [facility] PT Assessment - 04/16/16 0001    AROM   Overall AROM  Deficits   AROM Assessment Site Knee   Right Knee Extension -15   Right Knee Flexion 98   PROM   Overall PROM  Deficits   Right/Left Knee Right   Right Knee Extension -10   Right Knee Flexion 106                     [facility] Adult PT Treatment/Exercise - 04/16/16 0001    Knee/Hip Exercises: Stretches   Knee: Self-Stretch to increase Flexion Right;3 reps;30 seconds   Knee/Hip Exercises: Aerobic   Nustep L5, seat 7 x15 min with UE/ RLE   Knee/Hip Exercises: Standing   Rocker Board 2 minutes    Acupuncturist Location R knee   Electrical Stimulation Action IFC   Electrical Stimulation Parameters 1-10hz    Electrical Stimulation Goals Edema;Pain   Vasopneumatic   Number Minutes Vasopneumatic  15 minutes   Vasopnuematic Location  Knee   Vasopneumatic Pressure Low   Manual Therapy   Manual Therapy Passive ROM;Soft tissue mobilization   Passive ROM PROM of R knee into flexion and extension with gentle holds at end range                   PT Short Term Goals - 03/14/16 1056    PT SHORT TERM GOAL #1   Title Decreased edema in R knee to within 1.5 cm of L   Time 3   Period Weeks   Status Achieved  1.5 03/14/16           PT Long Term Goals - 04/02/16 1123    PT LONG TERM GOAL #1   Title I with HEP   Time 6   Period Weeks   Status Achieved   PT LONG TERM GOAL #2   Title Improved R knee ROM -5 to 120 degrees to improve function   Time 6   Period Weeks   Status On-going  AROM -14-94 degrees 03/28/16   PT LONG TERM GOAL #3   Title Patient able to amb 300 feet safely without AD   Time 6   Period Weeks   Status On-going   PT LONG TERM GOAL #4   Title Able to climb stairs with reciprocal gait and 5/5 knee strength   Time 6   Period Weeks   Status On-going               Plan - 04/16/16 1118    Clinical Impression Statement Patient progressing with less pain over weekend and today. Patient was able to improve right knee flexion ROM with manual stretching. Patient has reported increased speed and time on her bike at home. Goals ongoing due to pain, edema, strength and ROM deficits.   Rehab Potential Excellent   PT Frequency 3x / week   PT Duration 6 weeks   PT Treatment/Interventions ADLs/Self Care Home Management;Electrical Stimulation;Cryotherapy;Therapeutic exercise;Manual techniques;Vasopneumatic Device;Passive range of motion;Patient/family education;Gait training;Stair training;Balance training;Neuromuscular  re-education;Scar mobilization   PT Next Visit Plan Continue per TKR protocol with modalities PRN for pain/add TKE next treatment per MPT  (MD. Aluisio 04/24/16)   Consulted and Agree  with Plan of Care Patient      Patient will benefit from skilled therapeutic intervention in order to improve the following deficits and impairments:  Abnormal gait, Decreased range of motion, Pain, Decreased scar mobility, Decreased strength, Increased edema, Decreased mobility  Visit Diagnosis: Pain in right knee  Stiffness of right knee, not elsewhere classified  Generalized edema     Problem List Patient Active Problem List   Diagnosis Date Noted  . History of arthroplasty of right knee 03/05/2016  . OA (osteoarthritis) of knee 02/13/2016  . Leg edema 12/07/2015  . Preop cardiovascular exam 12/07/2015  . B12 deficiency 12/08/2014  . Metabolic syndrome [id number]  . Bradycardia 05/31/2014  . Hyperlipemia 04/28/2013  . Vitamin D deficiency 04/28/2013  . Lumbar spondylosis with myelopathy 04/28/2013  . Osteoarthritis 04/28/2013  . Hypertension 04/28/2013  . Incontinence of urine 04/28/2013  . Hypothyroidism 04/28/2013  . Cough 08/21/2011  . Abnormal CXR 08/21/2011    Phillips Climes, PTA 04/16/2016, 11:50 AM  [facility] [place], [place], [address] Phone: [phone]   Fax:  [phone]  Name: Miranda Wilson MRN: [id number] Date of Birth: 04-27-1951

## 2016-04-18 ENCOUNTER — Encounter: Payer: Self-pay | Admitting: Physical Therapy

## 2016-04-18 ENCOUNTER — Ambulatory Visit: Payer: BLUE CROSS/BLUE SHIELD | Admitting: Physical Therapy

## 2016-04-18 DIAGNOSIS — M25561 Pain in right knee: Secondary | ICD-10-CM

## 2016-04-18 DIAGNOSIS — M25661 Stiffness of right knee, not elsewhere classified: Secondary | ICD-10-CM

## 2016-04-18 DIAGNOSIS — R601 Generalized edema: Secondary | ICD-10-CM

## 2016-04-18 NOTE — Therapy (Signed)
[facility] [place], [place], [address] Phone: [phone]   Fax:  [phone]  Physical Therapy Treatment  Patient Details  Name: Miranda Wilson MRN: [id number] Date of Birth: Sep 17, 1951 Referring Provider: Gorden Harms, MD  Encounter Date: 04/18/2016      PT End of Session - 04/18/16 1129    Visit Number 22   Number of Visits 27   Date for PT Re-Evaluation 05/02/16   PT Start Time 1031   PT Stop Time 1141   PT Time Calculation (min) 70 min   Activity Tolerance Patient tolerated treatment well   Behavior During Therapy [facility] for tasks assessed/performed      Past Medical History  Diagnosis Date  . Hyperlipidemia   . Hypertension   . IBS (irritable bowel syndrome)   . Varicose veins   . PMB (postmenopausal bleeding)   . History of idiopathic seizure     AGE 34 to 20  X5  --  UNKNOW IDIOLOGY , PER PT WAS ANEMIC AT THE TIME--  NONE SINCE  . S/P radioactive iodine thyroid ablation   . History of tachycardia     S/P  RADIOACTIVE IODINE ABLATION OF THYROID  . History of colon polyps   . Cervical spondylosis   . OA (osteoarthritis)     shoulders  left > right  . GERD (gastroesophageal reflux disease)   . Chronic constipation   . Urge urinary incontinence   . Bilateral edema of lower extremity     left > right --  wear compression hose  . Hypothyroidism, postradioiodine therapy     AGE 77  . At risk for sleep apnea   . Vein disorder     LEFT ANKLE VEIN VALVE REFLUX WITH DECREASED CIRCULATION     Past Surgical History  Procedure Laterality Date  . Varicose vein surgery  1998 to 2010    includes laser and phlebectomies  . Cesarean section  1979  . Endovenous ablation saphenous vein w/ laser  04/2007  . Tonsillectomy  1955  . Colonoscopy w/ polypectomy  10-25-2008  . Total knee arthroplasty Left 12-19-2009  . Closed left knee manipulation and right knee cortisone injection  02-20-2010  . Left knee  arthrotomy w/ lysis adhesions  12-01-2010  . Laparoscopic cholecystectomy  1993    and BILATERAL TUBAL LIGATION  . Cataract extraction w/ intraocular lens  implant, bilateral  right 2011//  left 2013  . Hysteroscopy w/d&c N/A 11/11/2014    Procedure: DILATATION AND CURETTAGE /HYSTEROSCOPY;  Surgeon: Gus Height, MD;  Location: [facility];  Service: Gynecology;  Laterality: N/A;  . Eye surgery    . Dilation and curettage of uterus    . Joint replacement    . Total knee arthroplasty Right 02/13/2016    Procedure: RIGHT TOTAL KNEE ARTHROPLASTY;  Surgeon: Gaynelle Arabian, MD;  Location: [facility];  Service: Orthopedics;  Laterality: Right;    There were no vitals filed for this visit.      Subjective Assessment - 04/18/16 1055    Subjective Patient has reported some soreness today in left knee from doing too much at home   Pertinent History HTN, venous insuffieciency, back problems, disc problems, L TKR 2011   Patient Stated Goals walk and stand with good balance/comfort; regain ROM   Currently in Pain? Yes   Pain Score 4    Pain Location Knee   Pain Orientation Right   Pain Descriptors / Indicators Sore   Pain  Type Surgical pain   Pain Onset More than a month ago   Pain Frequency Intermittent   Aggravating Factors  increased activity and ROM   Pain Relieving Factors at rest            [facility] PT Assessment - 04/18/16 0001    AROM   Overall AROM  Deficits   AROM Assessment Site Knee   Right Knee Extension -15   Right Knee Flexion 98   PROM   Overall PROM  Deficits   Right/Left Knee Right   Right Knee Extension -8   Right Knee Flexion 105                     [facility] Adult PT Treatment/Exercise - 04/18/16 0001    Knee/Hip Exercises: Stretches   Knee: Self-Stretch to increase Flexion Right;3 reps;30 seconds   Knee/Hip Exercises: Aerobic   Nustep L5, seat 7 x15 min with UE/ RLE   Knee/Hip Exercises: Standing   Terminal Knee Extension Limitations pink  XTS 2x10, difficulty due to ROM defict   Forward Step Up Left;3 sets;10 reps;Step Height: 6"   Acupuncturist Location R knee   Electrical Stimulation Action IFC   Electrical Stimulation Parameters 1-10hz    Electrical Stimulation Goals Edema;Pain   Vasopneumatic   Number Minutes Vasopneumatic  15 minutes   Vasopnuematic Location  Knee   Vasopneumatic Pressure Low   Manual Therapy   Manual Therapy Passive ROM   Passive ROM PROM of R knee into flexion and extension with gentle holds at end range                   PT Short Term Goals - 03/14/16 1056    PT SHORT TERM GOAL #1   Title Decreased edema in R knee to within 1.5 cm of L   Time 3   Period Weeks   Status Achieved  1.5 03/14/16           PT Long Term Goals - 04/18/16 1135    PT LONG TERM GOAL #1   Title I with HEP   Time 6   Period Weeks   Status Achieved   PT LONG TERM GOAL #2   Title Improved R knee ROM -5 to 120 degrees to improve function   Time 6   Period Weeks   Status On-going  AROM -15-98 degrees 04/18/16   PT LONG TERM GOAL #3   Title Patient able to amb 300 feet safely without AD   Time 6   Period Weeks   Status On-going   PT LONG TERM GOAL #4   Title Able to climb stairs with reciprocal gait and 5/5 knee strength   Time 6   Period Weeks   Status On-going               Plan - 04/18/16 1131    Clinical Impression Statement Patient progressing slow with ROM due to pain, guarding and stiffness in left knee, progressing with strengtheing yet some limited due to compensations with exercises may due to weakness and ROM deficits. Patient improved PROM for knee ext with prolong stretching today. Patient goals ongoing at this time due to ROM, strength, edema and pain deficits.   Rehab Potential Excellent   PT Frequency 3x / week   PT Duration 6 weeks   PT Treatment/Interventions ADLs/Self Care Home Management;Electrical Stimulation;Cryotherapy;Therapeutic  exercise;Manual techniques;Vasopneumatic Device;Passive range of motion;Patient/family education;Gait training;Stair training;Balance training;Neuromuscular re-education;Scar mobilization  PT Next Visit Plan Continue per TKR protocol with modalities PRN for pain/add TKE next treatment per MPT  (MD. Aluisio 04/24/16)   Consulted and Agree with Plan of Care Patient      Patient will benefit from skilled therapeutic intervention in order to improve the following deficits and impairments:  Abnormal gait, Decreased range of motion, Pain, Decreased scar mobility, Decreased strength, Increased edema, Decreased mobility  Visit Diagnosis: Pain in right knee  Stiffness of right knee, not elsewhere classified  Generalized edema     Problem List Patient Active Problem List   Diagnosis Date Noted  . History of arthroplasty of right knee 03/05/2016  . OA (osteoarthritis) of knee 02/13/2016  . Leg edema 12/07/2015  . Preop cardiovascular exam 12/07/2015  . B12 deficiency 12/08/2014  . Metabolic syndrome [id number]  . Bradycardia 05/31/2014  . Hyperlipemia 04/28/2013  . Vitamin D deficiency 04/28/2013  . Lumbar spondylosis with myelopathy 04/28/2013  . Osteoarthritis 04/28/2013  . Hypertension 04/28/2013  . Incontinence of urine 04/28/2013  . Hypothyroidism 04/28/2013  . Cough 08/21/2011  . Abnormal CXR 08/21/2011    Phillips Climes, PTA 04/18/2016, 11:48 AM  [facility] [place], [place], [address] Phone: [phone]   Fax:  [phone]  Name: Miranda Wilson MRN: [id number] Date of Birth: [birth year]

## 2016-04-20 ENCOUNTER — Ambulatory Visit: Payer: BLUE CROSS/BLUE SHIELD | Admitting: Physical Therapy

## 2016-04-20 DIAGNOSIS — M25561 Pain in right knee: Secondary | ICD-10-CM

## 2016-04-20 DIAGNOSIS — M25661 Stiffness of right knee, not elsewhere classified: Secondary | ICD-10-CM

## 2016-04-20 NOTE — Therapy (Signed)
[facility] [place], [place], [address] Phone: [phone]   Fax:  [phone]  Physical Therapy Treatment  Patient Details  Name: Miranda Wilson MRN: [id number] Date of Birth: 06/01/1951 Referring Provider: Gorden Harms, MD  Encounter Date: 04/20/2016      PT End of Session - 04/20/16 1525    Visit Number 23   Number of Visits 27   Date for PT Re-Evaluation 05/02/16   PT Start Time 1030   PT Stop Time 1122   PT Time Calculation (min) 52 min      Past Medical History  Diagnosis Date  . Hyperlipidemia   . Hypertension   . IBS (irritable bowel syndrome)   . Varicose veins   . PMB (postmenopausal bleeding)   . History of idiopathic seizure     AGE 59 to 20  X5  --  UNKNOW IDIOLOGY , PER PT WAS ANEMIC AT THE TIME--  NONE SINCE  . S/P radioactive iodine thyroid ablation   . History of tachycardia     S/P  RADIOACTIVE IODINE ABLATION OF THYROID  . History of colon polyps   . Cervical spondylosis   . OA (osteoarthritis)     shoulders  left > right  . GERD (gastroesophageal reflux disease)   . Chronic constipation   . Urge urinary incontinence   . Bilateral edema of lower extremity     left > right --  wear compression hose  . Hypothyroidism, postradioiodine therapy     AGE 33  . At risk for sleep apnea   . Vein disorder     LEFT ANKLE VEIN VALVE REFLUX WITH DECREASED CIRCULATION     Past Surgical History  Procedure Laterality Date  . Varicose vein surgery  1998 to 2010    includes laser and phlebectomies  . Cesarean section  1979  . Endovenous ablation saphenous vein w/ laser  04/2007  . Tonsillectomy  1955  . Colonoscopy w/ polypectomy  10-25-2008  . Total knee arthroplasty Left 12-19-2009  . Closed left knee manipulation and right knee cortisone injection  02-20-2010  . Left knee arthrotomy w/ lysis adhesions  12-01-2010  . Laparoscopic cholecystectomy  1993    and BILATERAL TUBAL LIGATION  .  Cataract extraction w/ intraocular lens  implant, bilateral  right 2011//  left 2013  . Hysteroscopy w/d&c N/A 11/11/2014    Procedure: DILATATION AND CURETTAGE /HYSTEROSCOPY;  Surgeon: Gus Height, MD;  Location: [facility];  Service: Gynecology;  Laterality: N/A;  . Eye surgery    . Dilation and curettage of uterus    . Joint replacement    . Total knee arthroplasty Right 02/13/2016    Procedure: RIGHT TOTAL KNEE ARTHROPLASTY;  Surgeon: Gaynelle Arabian, MD;  Location: [facility];  Service: Orthopedics;  Laterality: Right;    There were no vitals filed for this visit.      Subjective Assessment - 04/20/16 1526    Subjective I'm discouraged and feel like I'm losing progress.   Pertinent History HTN, venous insuffieciency, back problems, disc problems, L TKR 2011   Patient Stated Goals walk and stand with good balance/comfort; regain ROM   Pain Score 4    Pain Location Knee   Pain Orientation Right   Pain Descriptors / Indicators Sore   Pain Type Surgical pain   Pain Onset More than a month ago  [place] Adult PT Treatment/Exercise - 04/20/16 0001    Knee/Hip Exercises: Aerobic   Nustep Level 5 x 15 minutes.   Knee/Hip Exercises: Standing   Other Standing Knee Exercises 12 inch box lunges x 4 minutes.   Acupuncturist Location RT knee   Electrical Stimulation Action IFC x 15 minutes.   Vasopneumatic   Number Minutes Vasopneumatic  15 minutes   Vasopnuematic Location  --  Rt knee.   Vasopneumatic Pressure Medium   Manual Therapy   Passive ROM PROM into flexion and extension x 12 minutes.                  PT Short Term Goals - 03/14/16 1056    PT SHORT TERM GOAL #1   Title Decreased edema in R knee to within 1.5 cm of L   Time 3   Period Weeks   Status Achieved  1.5 03/14/16           PT Long Term Goals - 04/18/16 1135    PT LONG TERM GOAL #1   Title I with HEP   Time 6    Period Weeks   Status Achieved   PT LONG TERM GOAL #2   Title Improved R knee ROM -5 to 120 degrees to improve function   Time 6   Period Weeks   Status On-going  AROM -15-98 degrees 04/18/16   PT LONG TERM GOAL #3   Title Patient able to amb 300 feet safely without AD   Time 6   Period Weeks   Status On-going   PT LONG TERM GOAL #4   Title Able to climb stairs with reciprocal gait and 5/5 knee strength   Time 6   Period Weeks   Status On-going               Plan - 04/20/16 1529    Clinical Impression Statement Patient achieved passive right knee flexion to 110 degrees today.        Patient will benefit from skilled therapeutic intervention in order to improve the following deficits and impairments:  Abnormal gait, Decreased range of motion, Pain, Decreased scar mobility, Decreased strength, Increased edema, Decreased mobility  Visit Diagnosis: Pain in right knee  Stiffness of right knee, not elsewhere classified     Problem List Patient Active Problem List   Diagnosis Date Noted  . History of arthroplasty of right knee 03/05/2016  . OA (osteoarthritis) of knee 02/13/2016  . Leg edema 12/07/2015  . Preop cardiovascular exam 12/07/2015  . B12 deficiency 12/08/2014  . Metabolic syndrome [id number]  . Bradycardia 05/31/2014  . Hyperlipemia 04/28/2013  . Vitamin D deficiency 04/28/2013  . Lumbar spondylosis with myelopathy 04/28/2013  . Osteoarthritis 04/28/2013  . Hypertension 04/28/2013  . Incontinence of urine 04/28/2013  . Hypothyroidism 04/28/2013  . Cough 08/21/2011  . Abnormal CXR 08/21/2011    Emmry Hinsch, [place] MPT 04/20/2016, 3:30 PM  [facility] [address] [place], [place], [address] Phone: [phone]   Fax:  [phone]  Name: Miranda Wilson MRN: [id number] Date of Birth: 12/02/1950

## 2016-04-23 ENCOUNTER — Ambulatory Visit: Payer: BLUE CROSS/BLUE SHIELD | Admitting: Physical Therapy

## 2016-04-23 ENCOUNTER — Encounter: Payer: Self-pay | Admitting: Physical Therapy

## 2016-04-23 DIAGNOSIS — M25661 Stiffness of right knee, not elsewhere classified: Secondary | ICD-10-CM

## 2016-04-23 DIAGNOSIS — R601 Generalized edema: Secondary | ICD-10-CM

## 2016-04-23 DIAGNOSIS — M25561 Pain in right knee: Secondary | ICD-10-CM

## 2016-04-23 NOTE — Therapy (Signed)
[facility] [place], [place], [address] Phone: [phone]   Fax:  [phone]  Physical Therapy Treatment  Patient Details  Name: TREBA BECKIUS MRN: [id number] Date of Birth: 15-Feb-1951 Referring Provider: Gorden Harms, MD  Encounter Date: 04/23/2016      PT End of Session - 04/23/16 1114    Visit Number 24   Number of Visits 27   Date for PT Re-Evaluation 05/02/16   PT Start Time 1031   PT Stop Time 1130   PT Time Calculation (min) 59 min   Activity Tolerance Patient tolerated treatment well   Behavior During Therapy [facility] for tasks assessed/performed      Past Medical History  Diagnosis Date  . Hyperlipidemia   . Hypertension   . IBS (irritable bowel syndrome)   . Varicose veins   . PMB (postmenopausal bleeding)   . History of idiopathic seizure     AGE 65 to 65  X5  --  UNKNOW IDIOLOGY , PER PT WAS ANEMIC AT THE TIME--  NONE SINCE  . S/P radioactive iodine thyroid ablation   . History of tachycardia     S/P  RADIOACTIVE IODINE ABLATION OF THYROID  . History of colon polyps   . Cervical spondylosis   . OA (osteoarthritis)     shoulders  left > right  . GERD (gastroesophageal reflux disease)   . Chronic constipation   . Urge urinary incontinence   . Bilateral edema of lower extremity     left > right --  wear compression hose  . Hypothyroidism, postradioiodine therapy     AGE 65  . At risk for sleep apnea   . Vein disorder     LEFT ANKLE VEIN VALVE REFLUX WITH DECREASED CIRCULATION     Past Surgical History  Procedure Laterality Date  . Varicose vein surgery  1998 to 2010    includes laser and phlebectomies  . Cesarean section  1979  . Endovenous ablation saphenous vein w/ laser  04/2007  . Tonsillectomy  1955  . Colonoscopy w/ polypectomy  10-25-2008  . Total knee arthroplasty Left 12-19-2009  . Closed left knee manipulation and right knee cortisone injection  02-20-2010  . Left knee  arthrotomy w/ lysis adhesions  12-01-2010  . Laparoscopic cholecystectomy  1993    and BILATERAL TUBAL LIGATION  . Cataract extraction w/ intraocular lens  implant, bilateral  right 2011//  left 2013  . Hysteroscopy w/d&c N/A 11/11/2014    Procedure: DILATATION AND CURETTAGE /HYSTEROSCOPY;  Surgeon: Gus Height, MD;  Location: [facility];  Service: Gynecology;  Laterality: N/A;  . Eye surgery    . Dilation and curettage of uterus    . Joint replacement    . Total knee arthroplasty Right 02/13/2016    Procedure: RIGHT TOTAL KNEE ARTHROPLASTY;  Surgeon: Gaynelle Arabian, MD;  Location: [facility];  Service: Orthopedics;  Laterality: Right;    There were no vitals filed for this visit.      Subjective Assessment - 04/23/16 1034    Subjective Patient felt some improvement after last treatment and had a good weekend   Pertinent History HTN, venous insuffieciency, back problems, disc problems, L TKR 2011   Patient Stated Goals walk and stand with good balance/comfort; regain ROM   Currently in Pain? Yes   Pain Score 4    Pain Location Knee   Pain Orientation Right   Pain Descriptors / Indicators Sore   Pain Type Surgical pain  Pain Onset More than a month ago   Pain Frequency Intermittent   Aggravating Factors  ROM and activity   Pain Relieving Factors rest            [facility] PT Assessment - 04/23/16 0001    AROM   Overall AROM  Deficits   AROM Assessment Site Knee   Right Knee Extension -16   Right Knee Flexion 98   PROM   Overall PROM  Deficits   Right/Left Knee Right   Right Knee Extension -9   Right Knee Flexion 112                     [facility] Adult PT Treatment/Exercise - 04/23/16 0001    Knee/Hip Exercises: Stretches   Knee: Self-Stretch to increase Flexion Right;3 reps;30 seconds   Knee/Hip Exercises: Aerobic   Nustep Level 5 x 15 minutes UE's/Right LE   Knee/Hip Exercises: Standing   Rocker Board 2 minutes   Financial planner Location RT knee   Electrical Stimulation Action IFC   Electrical Stimulation Parameters 1-10hz    Electrical Stimulation Goals Edema;Pain   Vasopneumatic   Number Minutes Vasopneumatic  15 minutes   Vasopnuematic Location  Knee   Vasopneumatic Pressure Low   Manual Therapy   Manual Therapy Passive ROM   Passive ROM PROM into flexion and extension with low load holds                  PT Short Term Goals - 03/14/16 1056    PT SHORT TERM GOAL #1   Title Decreased edema in R knee to within 1.5 cm of L   Time 3   Period Weeks   Status Achieved  1.5 03/14/16           PT Long Term Goals - 04/23/16 1115    PT LONG TERM GOAL #1   Title I with HEP   Time 6   Period Weeks   Status Achieved   PT LONG TERM GOAL #2   Title Improved R knee ROM -5 to 120 degrees to improve function   Time 6   Period Weeks   Status On-going   PT LONG TERM GOAL #3   Title Patient able to amb 300 feet safely without AD   Time 6   Period Weeks   Status On-going   PT LONG TERM GOAL #4   Title Able to climb stairs with reciprocal gait and 5/5 knee strength   Time 6   Period Weeks   Status On-going               Plan - 04/23/16 1115    Clinical Impression Statement Patient progressing with ROM/stretching today esp with PROM into flexion. Patient has increased swelling and restrictions in right knee. Patient continues to ride her stationary bike and perform self ROM exercises daily at home. Goals ongoing at this time due to edema, strength and full ROM deficits.   Rehab Potential Excellent   PT Frequency 3x / week   PT Duration 6 weeks   PT Treatment/Interventions ADLs/Self Care Home Management;Electrical Stimulation;Cryotherapy;Therapeutic exercise;Manual techniques;Vasopneumatic Device;Passive range of motion;Patient/family education;Gait training;Stair training;Balance training;Neuromuscular re-education;Scar mobilization   PT Next Visit Plan Continue per  TKR protocol with modalities PRN for pain/add TKE next treatment per MPT  (MD. Aluisio 04/24/16)   Consulted and Agree with Plan of Care Patient      Patient will benefit from skilled therapeutic intervention in  order to improve the following deficits and impairments:  Abnormal gait, Decreased range of motion, Pain, Decreased scar mobility, Decreased strength, Increased edema, Decreased mobility  Visit Diagnosis: Pain in right knee  Stiffness of right knee, not elsewhere classified  Generalized edema     Problem List Patient Active Problem List   Diagnosis Date Noted  . History of arthroplasty of right knee 03/05/2016  . OA (osteoarthritis) of knee 02/13/2016  . Leg edema 12/07/2015  . Preop cardiovascular exam 12/07/2015  . B12 deficiency 12/08/2014  . Metabolic syndrome [id number]  . Bradycardia 05/31/2014  . Hyperlipemia 04/28/2013  . Vitamin D deficiency 04/28/2013  . Lumbar spondylosis with myelopathy 04/28/2013  . Osteoarthritis 04/28/2013  . Hypertension 04/28/2013  . Incontinence of urine 04/28/2013  . Hypothyroidism 04/28/2013  . Cough 08/21/2011  . Abnormal CXR 08/21/2011    APPLEGATE, [place], PTA 04/23/2016, 1:52 PM  Mali Applegate MPT [place], [place] 04/23/2016 1:52 PM   [facility] [place], [place], [address] Phone: [phone]   Fax:  [phone]  Name: ANTHONELLA ARGETA MRN: [id number] Date of Birth: Jul 31, 1951

## 2016-04-26 ENCOUNTER — Other Ambulatory Visit: Payer: Self-pay | Admitting: Family Medicine

## 2016-04-26 ENCOUNTER — Encounter: Payer: Self-pay | Admitting: Physical Therapy

## 2016-04-26 ENCOUNTER — Ambulatory Visit: Payer: BLUE CROSS/BLUE SHIELD | Admitting: Physical Therapy

## 2016-04-26 DIAGNOSIS — M25661 Stiffness of right knee, not elsewhere classified: Secondary | ICD-10-CM

## 2016-04-26 DIAGNOSIS — M25561 Pain in right knee: Secondary | ICD-10-CM | POA: Diagnosis not present

## 2016-04-26 DIAGNOSIS — R601 Generalized edema: Secondary | ICD-10-CM

## 2016-04-26 NOTE — Therapy (Signed)
[facility] [place], [place], [address] Phone: [phone]   Fax:  [phone]  Physical Therapy Treatment  Patient Details  Name: Miranda Wilson MRN: [id number] Date of Birth: 1950/11/25 Referring Provider: Gorden Harms, MD  Encounter Date: 04/26/2016      PT End of Session - 04/26/16 1114    Visit Number 25   Number of Visits 27   Date for PT Re-Evaluation 05/02/16   PT Start Time 1030   PT Stop Time 1128   PT Time Calculation (min) 58 min   Activity Tolerance Patient tolerated treatment well   Behavior During Therapy [facility] for tasks assessed/performed      Past Medical History  Diagnosis Date  . Hyperlipidemia   . Hypertension   . IBS (irritable bowel syndrome)   . Varicose veins   . PMB (postmenopausal bleeding)   . History of idiopathic seizure     AGE 65 to 65  X5  --  UNKNOW IDIOLOGY , PER PT WAS ANEMIC AT THE TIME--  NONE SINCE  . S/P radioactive iodine thyroid ablation   . History of tachycardia     S/P  RADIOACTIVE IODINE ABLATION OF THYROID  . History of colon polyps   . Cervical spondylosis   . OA (osteoarthritis)     shoulders  left > right  . GERD (gastroesophageal reflux disease)   . Chronic constipation   . Urge urinary incontinence   . Bilateral edema of lower extremity     left > right --  wear compression hose  . Hypothyroidism, postradioiodine therapy     AGE 65  . At risk for sleep apnea   . Vein disorder     LEFT ANKLE VEIN VALVE REFLUX WITH DECREASED CIRCULATION     Past Surgical History  Procedure Laterality Date  . Varicose vein surgery  1998 to 2010    includes laser and phlebectomies  . Cesarean section  1979  . Endovenous ablation saphenous vein w/ laser  04/2007  . Tonsillectomy  1955  . Colonoscopy w/ polypectomy  10-25-2008  . Total knee arthroplasty Left 12-19-2009  . Closed left knee manipulation and right knee cortisone injection  02-20-2010  . Left knee  arthrotomy w/ lysis adhesions  12-01-2010  . Laparoscopic cholecystectomy  1993    and BILATERAL TUBAL LIGATION  . Cataract extraction w/ intraocular lens  implant, bilateral  right 2011//  left 2013  . Hysteroscopy w/d&c N/A 11/11/2014    Procedure: DILATATION AND CURETTAGE /HYSTEROSCOPY;  Surgeon: Gus Height, MD;  Location: [facility];  Service: Gynecology;  Laterality: N/A;  . Eye surgery    . Dilation and curettage of uterus    . Joint replacement    . Total knee arthroplasty Right 02/13/2016    Procedure: RIGHT TOTAL KNEE ARTHROPLASTY;  Surgeon: Gaynelle Arabian, MD;  Location: [facility];  Service: Orthopedics;  Laterality: Right;    There were no vitals filed for this visit.      Subjective Assessment - 04/26/16 1039    Subjective Patient went to MD and he was pleased with progress, he would like patient to continue therapy a few more weeks.   Pertinent History HTN, venous insuffieciency, back problems, disc problems, L TKR 2011   Patient Stated Goals walk and stand with good balance/comfort; regain ROM   Currently in Pain? Yes   Pain Score 5    Pain Location Knee   Pain Orientation Right   Pain Descriptors /  Indicators Sore   Pain Type Surgical pain   Pain Onset More than a month ago   Pain Frequency Intermittent   Aggravating Factors  ROM   Pain Relieving Factors at rest            [facility] PT Assessment - 04/26/16 0001    AROM   Overall AROM  Deficits   AROM Assessment Site Knee   Right Knee Flexion 100                     [facility] Adult PT Treatment/Exercise - 04/26/16 0001    Knee/Hip Exercises: Stretches   Knee: Self-Stretch to increase Flexion Right;3 reps;30 seconds   Knee/Hip Exercises: Aerobic   Nustep Level 5 x 15 minutes UE's/Right LE   Knee/Hip Exercises: Standing   Forward Step Up Right;3 sets;10 reps;Step Height: 6"   Rocker Board 3 minutes   Acupuncturist Location RT knee   Programmer, systems IGC   Electrical Stimulation Parameters 1-10hz    Electrical Stimulation Goals Edema;Pain   Vasopneumatic   Number Minutes Vasopneumatic  15 minutes   Vasopnuematic Location  Knee   Vasopneumatic Pressure Low   Manual Therapy   Manual Therapy Passive ROM   Passive ROM PROM into flexion and extension with low load holds                  PT Short Term Goals - 03/14/16 1056    PT SHORT TERM GOAL #1   Title Decreased edema in R knee to within 1.5 cm of L   Time 3   Period Weeks   Status Achieved  1.5 03/14/16           PT Long Term Goals - 04/23/16 1115    PT LONG TERM GOAL #1   Title I with HEP   Time 6   Period Weeks   Status Achieved   PT LONG TERM GOAL #2   Title Improved R knee ROM -5 to 120 degrees to improve function   Time 6   Period Weeks   Status On-going   PT LONG TERM GOAL #3   Title Patient able to amb 300 feet safely without AD   Time 6   Period Weeks   Status On-going   PT LONG TERM GOAL #4   Title Able to climb stairs with reciprocal gait and 5/5 knee strength   Time 6   Period Weeks   Status On-going               Plan - 04/26/16 1115    Clinical Impression Statement Patient continues to tolerate treatment well and is progressing with strengthening and ROM this week. Patient has some difficulty sleeping due to lack of ROM. Patient goals ongoing at this time due to edema, strength and full ROM deficts.   Rehab Potential Excellent   PT Frequency 3x / week   PT Duration 6 weeks   PT Treatment/Interventions ADLs/Self Care Home Management;Electrical Stimulation;Cryotherapy;Therapeutic exercise;Manual techniques;Vasopneumatic Device;Passive range of motion;Patient/family education;Gait training;Stair training;Balance training;Neuromuscular re-education;Scar mobilization   PT Next Visit Plan Continue per TKR protocol with modalities PRN for pain/add TKE next treatment per MPT  (MD. Aluisio 04/24/16)   Consulted and Agree  with Plan of Care Patient      Patient will benefit from skilled therapeutic intervention in order to improve the following deficits and impairments:  Abnormal gait, Decreased range of motion, Pain, Decreased scar mobility, Decreased strength,  Increased edema, Decreased mobility  Visit Diagnosis: Pain in right knee  Stiffness of right knee, not elsewhere classified  Generalized edema     Problem List Patient Active Problem List   Diagnosis Date Noted  . History of arthroplasty of right knee 03/05/2016  . OA (osteoarthritis) of knee 02/13/2016  . Leg edema 12/07/2015  . Preop cardiovascular exam 12/07/2015  . B12 deficiency 12/08/2014  . Metabolic syndrome [id number]  . Bradycardia 05/31/2014  . Hyperlipemia 04/28/2013  . Vitamin D deficiency 04/28/2013  . Lumbar spondylosis with myelopathy 04/28/2013  . Osteoarthritis 04/28/2013  . Hypertension 04/28/2013  . Incontinence of urine 04/28/2013  . Hypothyroidism 04/28/2013  . Cough 08/21/2011  . Abnormal CXR 08/21/2011    Phillips Climes , PTA  04/26/2016, 11:28 AM  [facility] [place], [place], [address] Phone: [phone]   Fax:  [phone]  Name: KINLI WOLTZ MRN: [id number] Date of Birth: 06-Aug-1951

## 2016-05-02 ENCOUNTER — Ambulatory Visit: Payer: BLUE CROSS/BLUE SHIELD | Attending: Orthopedic Surgery | Admitting: Physical Therapy

## 2016-05-02 DIAGNOSIS — M25661 Stiffness of right knee, not elsewhere classified: Secondary | ICD-10-CM | POA: Diagnosis present

## 2016-05-02 DIAGNOSIS — R601 Generalized edema: Secondary | ICD-10-CM | POA: Diagnosis present

## 2016-05-02 DIAGNOSIS — M25561 Pain in right knee: Secondary | ICD-10-CM | POA: Insufficient documentation

## 2016-05-02 NOTE — Therapy (Signed)
[facility] [place], [place], [address] Phone: [phone]   Fax:  [phone]  Physical Therapy Treatment  Patient Details  Name: Miranda Wilson MRN: [id number] Date of Birth: October 27, 1951 Referring Provider: Gorden Harms, MD  Encounter Date: 05/02/2016      PT End of Session - 05/02/16 1207    Visit Number 26   Number of Visits 27   Date for PT Re-Evaluation 05/02/16   PT Start Time 1115   PT Stop Time 1215   PT Time Calculation (min) 60 min   Activity Tolerance Patient tolerated treatment well   Behavior During Therapy [facility] for tasks assessed/performed      Past Medical History  Diagnosis Date  . Hyperlipidemia   . Hypertension   . IBS (irritable bowel syndrome)   . Varicose veins   . PMB (postmenopausal bleeding)   . History of idiopathic seizure     AGE 65 to 65  X5  --  UNKNOW IDIOLOGY , PER PT WAS ANEMIC AT THE TIME--  NONE SINCE  . S/P radioactive iodine thyroid ablation   . History of tachycardia     S/P  RADIOACTIVE IODINE ABLATION OF THYROID  . History of colon polyps   . Cervical spondylosis   . OA (osteoarthritis)     shoulders  left > right  . GERD (gastroesophageal reflux disease)   . Chronic constipation   . Urge urinary incontinence   . Bilateral edema of lower extremity     left > right --  wear compression hose  . Hypothyroidism, postradioiodine therapy     AGE 65  . At risk for sleep apnea   . Vein disorder     LEFT ANKLE VEIN VALVE REFLUX WITH DECREASED CIRCULATION     Past Surgical History  Procedure Laterality Date  . Varicose vein surgery  1998 to 2010    includes laser and phlebectomies  . Cesarean section  1979  . Endovenous ablation saphenous vein w/ laser  04/2007  . Tonsillectomy  1955  . Colonoscopy w/ polypectomy  10-25-2008  . Total knee arthroplasty Left 12-19-2009  . Closed left knee manipulation and right knee cortisone injection  02-20-2010  . Left knee  arthrotomy w/ lysis adhesions  12-01-2010  . Laparoscopic cholecystectomy  1993    and BILATERAL TUBAL LIGATION  . Cataract extraction w/ intraocular lens  implant, bilateral  right 2011//  left 2013  . Hysteroscopy w/d&c N/A 11/11/2014    Procedure: DILATATION AND CURETTAGE /HYSTEROSCOPY;  Surgeon: Gus Height, MD;  Location: [facility];  Service: Gynecology;  Laterality: N/A;  . Eye surgery    . Dilation and curettage of uterus    . Joint replacement    . Total knee arthroplasty Right 02/13/2016    Procedure: RIGHT TOTAL KNEE ARTHROPLASTY;  Surgeon: Gaynelle Arabian, MD;  Location: [facility];  Service: Orthopedics;  Laterality: Right;    There were no vitals filed for this visit.      Subjective Assessment - 05/02/16 1118    Subjective Pt pleased with progress, states she is getting around better   Pertinent History HTN, venous insuffieciency, back problems, disc problems, L TKR 2011   Patient Stated Goals walk and stand with good balance/comfort; regain ROM   Currently in Pain? Yes   Pain Score 2    Pain Location Knee   Pain Orientation Right   Pain Descriptors / Indicators --  stiff   Pain Type Surgical pain  Pain Onset More than a month ago                         [facility] Adult PT Treatment/Exercise - 05/02/16 0001    Knee/Hip Exercises: Stretches   Knee: Self-Stretch to increase Flexion Right;3 reps;30 seconds   Knee/Hip Exercises: Aerobic   Nustep level 6 x 15 minutes   Knee/Hip Exercises: Standing   Forward Step Up Right;3 sets;10 reps   Rocker Board 3 minutes   Other Standing Knee Exercises TKE with red theraband x 30 with tactile cues   Electrical Stimulation   Electrical Stimulation Location Rt knee   Electrical Stimulation Action IFC   Electrical Stimulation Parameters 1-10 hz   Electrical Stimulation Goals Edema;Pain   Vasopneumatic   Number Minutes Vasopneumatic  15 minutes   Vasopnuematic Location  Knee   Vasopneumatic Pressure Low    Manual Therapy   Joint Mobilization grade 2-3 into knee flex and extension, patella mobilization all directions                PT Education - 05/02/16 1205    Education provided Yes   Education Details TKE with red theraband, patella self mobilzation   Person(s) Educated Patient   Methods Explanation;Demonstration   Comprehension Verbalized understanding;Returned demonstration          PT Short Term Goals - 05/02/16 1209    PT SHORT TERM GOAL #1   Title Decreased edema in R knee to within 1.5 cm of L   Status Achieved           PT Long Term Goals - 05/02/16 1210    PT LONG TERM GOAL #1   Title I with HEP   Status Achieved   PT LONG TERM GOAL #2   Title Improved R knee ROM -5 to 120 degrees to improve function   Status On-going   PT LONG TERM GOAL #3   Title Patient able to amb 300 feet safely without AD   Status On-going   PT LONG TERM GOAL #4   Title Able to climb stairs with reciprocal gait and 5/5 knee strength   Status On-going               Plan - 05/02/16 1208    Clinical Impression Statement Pt continues with decreased patella mobility and decreased Rt knee ROM. Pt wishes to reduce pain in order to come off of pain meds full time.   Rehab Potential Excellent   PT Frequency 3x / week   PT Duration 6 weeks   PT Treatment/Interventions ADLs/Self Care Home Management;Electrical Stimulation;Cryotherapy;Therapeutic exercise;Manual techniques;Vasopneumatic Device;Passive range of motion;Patient/family education;Gait training;Stair training;Balance training;Neuromuscular re-education;Scar mobilization   PT Next Visit Plan Continue per TKR protocol with modalities PRN for pain, continue patella mobilization   Consulted and Agree with Plan of Care Patient      Patient will benefit from skilled therapeutic intervention in order to improve the following deficits and impairments:  Abnormal gait, Decreased range of motion, Pain, Decreased scar mobility,  Decreased strength, Increased edema, Decreased mobility  Visit Diagnosis: Pain in right knee  Stiffness of right knee, not elsewhere classified  Generalized edema     Problem List Patient Active Problem List   Diagnosis Date Noted  . History of arthroplasty of right knee 03/05/2016  . OA (osteoarthritis) of knee 02/13/2016  . Leg edema 12/07/2015  . Preop cardiovascular exam 12/07/2015  . B12 deficiency 12/08/2014  . Metabolic syndrome  05/31/2014  . Bradycardia 05/31/2014  . Hyperlipemia 04/28/2013  . Vitamin D deficiency 04/28/2013  . Lumbar spondylosis with myelopathy 04/28/2013  . Osteoarthritis 04/28/2013  . Hypertension 04/28/2013  . Incontinence of urine 04/28/2013  . Hypothyroidism 04/28/2013  . Cough 08/21/2011  . Abnormal CXR 08/21/2011    Isabelle Course, PT, DPT  05/02/2016, 12:11 PM  [facility] [address] [place], [place], [address] Phone: [phone]   Fax:  [phone]  Name: CHIQUITTA ARNALL MRN: [id number] Date of Birth: 01-19-1951

## 2016-05-04 ENCOUNTER — Ambulatory Visit: Payer: BLUE CROSS/BLUE SHIELD | Admitting: Physical Therapy

## 2016-05-04 DIAGNOSIS — R601 Generalized edema: Secondary | ICD-10-CM

## 2016-05-04 DIAGNOSIS — M25661 Stiffness of right knee, not elsewhere classified: Secondary | ICD-10-CM

## 2016-05-04 DIAGNOSIS — M25561 Pain in right knee: Secondary | ICD-10-CM | POA: Diagnosis not present

## 2016-05-04 NOTE — Therapy (Signed)
[facility] [place], [place], [address] Phone: [phone]   Fax:  [phone]  Physical Therapy Treatment  Patient Details  Name: Miranda Wilson MRN: [id number] Date of Birth: Aug 01, 1951 Referring Provider: Gorden Harms, MD  Encounter Date: 05/04/2016      PT End of Session - 05/04/16 1252    Visit Number 27   Number of Visits 35   Date for PT Re-Evaluation 06/01/16   PT Start Time 1030   PT Stop Time 1123   PT Time Calculation (min) 53 min   Activity Tolerance Patient tolerated treatment well   Behavior During Therapy [facility] for tasks assessed/performed      Past Medical History  Diagnosis Date  . Hyperlipidemia   . Hypertension   . IBS (irritable bowel syndrome)   . Varicose veins   . PMB (postmenopausal bleeding)   . History of idiopathic seizure     AGE 1 to 20  X5  --  UNKNOW IDIOLOGY , PER PT WAS ANEMIC AT THE TIME--  NONE SINCE  . S/P radioactive iodine thyroid ablation   . History of tachycardia     S/P  RADIOACTIVE IODINE ABLATION OF THYROID  . History of colon polyps   . Cervical spondylosis   . OA (osteoarthritis)     shoulders  left > right  . GERD (gastroesophageal reflux disease)   . Chronic constipation   . Urge urinary incontinence   . Bilateral edema of lower extremity     left > right --  wear compression hose  . Hypothyroidism, postradioiodine therapy     AGE 65  . At risk for sleep apnea   . Vein disorder     LEFT ANKLE VEIN VALVE REFLUX WITH DECREASED CIRCULATION     Past Surgical History  Procedure Laterality Date  . Varicose vein surgery  1998 to 2010    includes laser and phlebectomies  . Cesarean section  1979  . Endovenous ablation saphenous vein w/ laser  04/2007  . Tonsillectomy  1955  . Colonoscopy w/ polypectomy  10-25-2008  . Total knee arthroplasty Left 12-19-2009  . Closed left knee manipulation and right knee cortisone injection  02-20-2010  . Left knee  arthrotomy w/ lysis adhesions  12-01-2010  . Laparoscopic cholecystectomy  1993    and BILATERAL TUBAL LIGATION  . Cataract extraction w/ intraocular lens  implant, bilateral  right 2011//  left 2013  . Hysteroscopy w/d&c N/A 11/11/2014    Procedure: DILATATION AND CURETTAGE /HYSTEROSCOPY;  Surgeon: Gus Height, MD;  Location: [facility];  Service: Gynecology;  Laterality: N/A;  . Eye surgery    . Dilation and curettage of uterus    . Joint replacement    . Total knee arthroplasty Right 02/13/2016    Procedure: RIGHT TOTAL KNEE ARTHROPLASTY;  Surgeon: Gaynelle Arabian, MD;  Location: [facility];  Service: Orthopedics;  Laterality: Right;    There were no vitals filed for this visit.      Subjective Assessment - 05/04/16 1253    Subjective Having a pretty good day.   Patient Stated Goals walk and stand with good balance/comfort; regain ROM   Pain Score 2    Pain Location Knee   Pain Orientation Right   Pain Descriptors / Indicators --  Stiff.   Pain Type Surgical pain   Pain Onset More than a month ago  PT Short Term Goals - 05/02/16 1209    PT SHORT TERM GOAL #1   Title Decreased edema in R knee to within 1.5 cm of L   Status Achieved           PT Long Term Goals - 05/02/16 1210    PT LONG TERM GOAL #1   Title I with HEP   Status Achieved   PT LONG TERM GOAL #2   Title Improved R knee ROM -5 to 120 degrees to improve function   Status On-going   PT LONG TERM GOAL #3   Title Patient able to amb 300 feet safely without AD   Status On-going   PT LONG TERM GOAL #4   Title Able to climb stairs with reciprocal gait and 5/5 knee strength   Status On-going             Patient will benefit from skilled therapeutic intervention in order to improve the following deficits and impairments:     Visit Diagnosis: Pain in right knee  Stiffness of right knee, not elsewhere classified  Generalized  edema     Problem List Patient Active Problem List   Diagnosis Date Noted  . History of arthroplasty of right knee 03/05/2016  . OA (osteoarthritis) of knee 02/13/2016  . Leg edema 12/07/2015  . Preop cardiovascular exam 12/07/2015  . B12 deficiency 12/08/2014  . Metabolic syndrome [id number]  . Bradycardia 05/31/2014  . Hyperlipemia 04/28/2013  . Vitamin D deficiency 04/28/2013  . Lumbar spondylosis with myelopathy 04/28/2013  . Osteoarthritis 04/28/2013  . Hypertension 04/28/2013  . Incontinence of urine 04/28/2013  . Hypothyroidism 04/28/2013  . Cough 08/21/2011  . Abnormal CXR 08/21/2011  Treatment:  Nustep x 15 minutes f/b Rockerboard x 3 minutes f/b 12 inch box lunges x 3 minutes f/b right patellar mobs and PROM into flexion x 11 minutes.  IFC and medium vasopnuematic x 15 minutes.  Asley Baskerville, [place] MPT 05/04/2016, 12:54 PM  [facility] [address] [place], [place], [address] Phone: [phone]   Fax:  [phone]  Name: LILIANI WOJTAS MRN: [id number] Date of Birth: 1951/06/12

## 2016-05-08 ENCOUNTER — Ambulatory Visit: Payer: BLUE CROSS/BLUE SHIELD | Admitting: *Deleted

## 2016-05-08 ENCOUNTER — Ambulatory Visit (INDEPENDENT_AMBULATORY_CARE_PROVIDER_SITE_OTHER): Payer: BLUE CROSS/BLUE SHIELD | Admitting: *Deleted

## 2016-05-08 DIAGNOSIS — M25561 Pain in right knee: Secondary | ICD-10-CM

## 2016-05-08 DIAGNOSIS — E538 Deficiency of other specified B group vitamins: Secondary | ICD-10-CM

## 2016-05-08 DIAGNOSIS — M25661 Stiffness of right knee, not elsewhere classified: Secondary | ICD-10-CM

## 2016-05-08 DIAGNOSIS — R601 Generalized edema: Secondary | ICD-10-CM

## 2016-05-08 NOTE — Therapy (Signed)
[facility] [place], [place], [address] Phone: [phone]   Fax:  [phone]  Physical Therapy Treatment  Patient Details  Name: Miranda Wilson MRN: [id number] Date of Birth: [birth year] Referring Provider: Gorden Harms, MD  Encounter Date: 05/08/2016      PT End of Session - 05/08/16 0917    Visit Number 28   Number of Visits 35   Date for PT Re-Evaluation 06/01/16   PT Start Time 0900   PT Stop Time 0952   PT Time Calculation (min) 52 min      Past Medical History  Diagnosis Date  . Hyperlipidemia   . Hypertension   . IBS (irritable bowel syndrome)   . Varicose veins   . PMB (postmenopausal bleeding)   . History of idiopathic seizure     AGE 15 to 20  X5  --  UNKNOW IDIOLOGY , PER PT WAS ANEMIC AT THE TIME--  NONE SINCE  . S/P radioactive iodine thyroid ablation   . History of tachycardia     S/P  RADIOACTIVE IODINE ABLATION OF THYROID  . History of colon polyps   . Cervical spondylosis   . OA (osteoarthritis)     shoulders  left > right  . GERD (gastroesophageal reflux disease)   . Chronic constipation   . Urge urinary incontinence   . Bilateral edema of lower extremity     left > right --  wear compression hose  . Hypothyroidism, postradioiodine therapy     AGE 15  . At risk for sleep apnea   . Vein disorder     LEFT ANKLE VEIN VALVE REFLUX WITH DECREASED CIRCULATION     Past Surgical History  Procedure Laterality Date  . Varicose vein surgery  1998 to 2010    includes laser and phlebectomies  . Cesarean section  1979  . Endovenous ablation saphenous vein w/ laser  04/2007  . Tonsillectomy  1955  . Colonoscopy w/ polypectomy  10-25-2008  . Total knee arthroplasty Left 12-19-2009  . Closed left knee manipulation and right knee cortisone injection  02-20-2010  . Left knee arthrotomy w/ lysis adhesions  12-01-2010  . Laparoscopic cholecystectomy  1993    and BILATERAL TUBAL LIGATION  .  Cataract extraction w/ intraocular lens  implant, bilateral  right 2011//  left 2013  . Hysteroscopy w/d&c N/A 11/11/2014    Procedure: DILATATION AND CURETTAGE /HYSTEROSCOPY;  Surgeon: Gus Height, MD;  Location: [facility];  Service: Gynecology;  Laterality: N/A;  . Eye surgery    . Dilation and curettage of uterus    . Joint replacement    . Total knee arthroplasty Right 02/13/2016    Procedure: RIGHT TOTAL KNEE ARTHROPLASTY;  Surgeon: Gaynelle Arabian, MD;  Location: [facility];  Service: Orthopedics;  Laterality: Right;    There were no vitals filed for this visit.      Subjective Assessment - 05/08/16 0915    Subjective RT knee continues to hurt. Trying to decrease pain meds   Pertinent History HTN, venous insuffieciency, back problems, disc problems, L TKR 2011   Patient Stated Goals walk and stand with good balance/comfort; regain ROM   Currently in Pain? Yes   Pain Score 2    Pain Location Knee   Pain Orientation Right   Pain Type Surgical pain   Pain Onset More than a month ago   Pain Frequency Intermittent  [place] Adult PT Treatment/Exercise - 05/08/16 0001    Knee/Hip Exercises: Stretches   Knee: Self-Stretch to increase Flexion Right;3 reps;30 seconds   Knee/Hip Exercises: Aerobic   Nustep level 6 x 15 minutes   Knee/Hip Exercises: Standing   Rocker Board 3 minutes  calf stretching   Electrical Stimulation   Electrical Stimulation Location RT knee IFC 1-10 hz x 15 mins   Electrical Stimulation Goals Edema;Pain   Vasopneumatic   Number Minutes Vasopneumatic  15 minutes   Vasopnuematic Location  Knee   Vasopneumatic Pressure Low   Vasopneumatic Temperature  60  pt likes it at 60   Manual Therapy   Manual Therapy Passive ROM   Joint Mobilization grade 2-3 into knee flex and extension, patella mobilization all directions   Passive ROM PROM into flexion and extension with low load holds                   PT Short Term Goals - 05/02/16 1209    PT SHORT TERM GOAL #1   Title Decreased edema in R knee to within 1.5 cm of L   Status Achieved           PT Long Term Goals - 05/02/16 1210    PT LONG TERM GOAL #1   Title I with HEP   Status Achieved   PT LONG TERM GOAL #2   Title Improved R knee ROM -5 to 120 degrees to improve function   Status On-going   PT LONG TERM GOAL #3   Title Patient able to amb 300 feet safely without AD   Status On-going   PT LONG TERM GOAL #4   Title Able to climb stairs with reciprocal gait and 5/5 knee strength   Status On-going               Plan - 05/08/16 1311    Clinical Impression Statement Pt did fairly well with Rx today and was able to perform EXs and ACT.'s for RT LE with minimal increase in pain. Pt continues to need manual therapy to help increase flexion and extension ROM and goals are ongoing.   PT Frequency 3x / week   PT Duration 6 weeks   PT Treatment/Interventions ADLs/Self Care Home Management;Electrical Stimulation;Cryotherapy;Therapeutic exercise;Manual techniques;Vasopneumatic Device;Passive range of motion;Patient/family education;Gait training;Stair training;Balance training;Neuromuscular re-education;Scar mobilization   PT Next Visit Plan Continue per TKR protocol with modalities PRN for pain, continue patella mobilization   Consulted and Agree with Plan of Care Patient      Patient will benefit from skilled therapeutic intervention in order to improve the following deficits and impairments:  Abnormal gait, Decreased range of motion, Pain, Decreased scar mobility, Decreased strength, Increased edema, Decreased mobility  Visit Diagnosis: Pain in right knee  Stiffness of right knee, not elsewhere classified  Generalized edema     Problem List Patient Active Problem List   Diagnosis Date Noted  . History of arthroplasty of right knee 03/05/2016  . OA (osteoarthritis) of knee 02/13/2016  . Leg edema 12/07/2015  .  Preop cardiovascular exam 12/07/2015  . B12 deficiency 12/08/2014  . Metabolic syndrome [id number]  . Bradycardia 05/31/2014  . Hyperlipemia 04/28/2013  . Vitamin D deficiency 04/28/2013  . Lumbar spondylosis with myelopathy 04/28/2013  . Osteoarthritis 04/28/2013  . Hypertension 04/28/2013  . Incontinence of urine 04/28/2013  . Hypothyroidism 04/28/2013  . Cough 08/21/2011  . Abnormal CXR 08/21/2011    RAMSEUR,CHRIS, PTA 05/08/2016, 1:15 PM  [facility] [place], [place], [address] Phone: [phone]   Fax:  [phone]  Name: Miranda Wilson MRN: [id number] Date of Birth: [birth year]

## 2016-05-09 NOTE — Progress Notes (Signed)
Pt given B12 injection IM left deltoid and tolerated well. °

## 2016-05-10 ENCOUNTER — Ambulatory Visit: Payer: BLUE CROSS/BLUE SHIELD | Admitting: Physical Therapy

## 2016-05-10 DIAGNOSIS — R601 Generalized edema: Secondary | ICD-10-CM

## 2016-05-10 DIAGNOSIS — M25661 Stiffness of right knee, not elsewhere classified: Secondary | ICD-10-CM

## 2016-05-10 DIAGNOSIS — M25561 Pain in right knee: Secondary | ICD-10-CM

## 2016-05-10 NOTE — Therapy (Signed)
[facility] [place], [place], [address] Phone: [phone]   Fax:  [phone]  Physical Therapy Treatment  Patient Details  Name: Miranda Wilson MRN: [id number] Date of Birth: 06-08-1951 Referring Provider: Gorden Harms, MD  Encounter Date: 05/10/2016      PT End of Session - 05/10/16 1045    Visit Number 29   Number of Visits 35   Date for PT Re-Evaluation 06/01/16   PT Start Time 1030   PT Stop Time 1122   PT Time Calculation (min) 52 min   Activity Tolerance Patient tolerated treatment well   Behavior During Therapy [facility] for tasks assessed/performed      Past Medical History  Diagnosis Date  . Hyperlipidemia   . Hypertension   . IBS (irritable bowel syndrome)   . Varicose veins   . PMB (postmenopausal bleeding)   . History of idiopathic seizure     AGE 65 to 20  X5  --  UNKNOW IDIOLOGY , PER PT WAS ANEMIC AT THE TIME--  NONE SINCE  . S/P radioactive iodine thyroid ablation   . History of tachycardia     S/P  RADIOACTIVE IODINE ABLATION OF THYROID  . History of colon polyps   . Cervical spondylosis   . OA (osteoarthritis)     shoulders  left > right  . GERD (gastroesophageal reflux disease)   . Chronic constipation   . Urge urinary incontinence   . Bilateral edema of lower extremity     left > right --  wear compression hose  . Hypothyroidism, postradioiodine therapy     AGE 73  . At risk for sleep apnea   . Vein disorder     LEFT ANKLE VEIN VALVE REFLUX WITH DECREASED CIRCULATION     Past Surgical History  Procedure Laterality Date  . Varicose vein surgery  1998 to 2010    includes laser and phlebectomies  . Cesarean section  1979  . Endovenous ablation saphenous vein w/ laser  04/2007  . Tonsillectomy  1955  . Colonoscopy w/ polypectomy  10-25-2008  . Total knee arthroplasty Left 12-19-2009  . Closed left knee manipulation and right knee cortisone injection  02-20-2010  . Left knee  arthrotomy w/ lysis adhesions  12-01-2010  . Laparoscopic cholecystectomy  1993    and BILATERAL TUBAL LIGATION  . Cataract extraction w/ intraocular lens  implant, bilateral  right 2011//  left 2013  . Hysteroscopy w/d&c N/A 11/11/2014    Procedure: DILATATION AND CURETTAGE /HYSTEROSCOPY;  Surgeon: Gus Height, MD;  Location: [facility];  Service: Gynecology;  Laterality: N/A;  . Eye surgery    . Dilation and curettage of uterus    . Joint replacement    . Total knee arthroplasty Right 02/13/2016    Procedure: RIGHT TOTAL KNEE ARTHROPLASTY;  Surgeon: Gaynelle Arabian, MD;  Location: [facility];  Service: Orthopedics;  Laterality: Right;    There were no vitals filed for this visit.      Subjective Assessment - 05/10/16 1108    Subjective That last treatment helped.   Pertinent History HTN, venous insuffieciency, back problems, disc problems, L TKR 2011   Patient Stated Goals walk and stand with good balance/comfort; regain ROM   Pain Score 2    Pain Location Knee   Pain Orientation Right   Pain Descriptors / Indicators --  Stiff.   Pain Onset More than a month ago  [place] Adult PT Treatment/Exercise - 2016-05-22 0001    Knee/Hip Exercises: Aerobic   Nustep Level 6 x 15 minutes.   Acupuncturist Location RT knee.   Electrical Stimulation Action IFC x 15 minutes.   Vasopneumatic   Number Minutes Vasopneumatic  15 minutes   Vasopnuematic Location  --  Right knee.   Vasopneumatic Pressure Low   Vasopneumatic Temperature  50   Manual Therapy   Soft tissue mobilization STW/M and right patellar mobs also STW/M to distal hamstrings f/b contract-relax technique to increase right knee flexion.                    PT Short Term Goals - 05/02/16 1209    PT SHORT TERM GOAL #1   Title Decreased edema in R knee to within 1.5 cm of L   Status Achieved           PT Long Term Goals - 05/02/16  1210    PT LONG TERM GOAL #1   Title I with HEP   Status Achieved   PT LONG TERM GOAL #2   Title Improved R knee ROM -5 to 120 degrees to improve function   Status On-going   PT LONG TERM GOAL #3   Title Patient able to amb 300 feet safely without AD   Status On-going   PT LONG TERM GOAL #4   Title Able to climb stairs with reciprocal gait and 5/5 knee strength   Status On-going             Patient will benefit from skilled therapeutic intervention in order to improve the following deficits and impairments:     Visit Diagnosis: Pain in right knee  Stiffness of right knee, not elsewhere classified  Generalized edema       G-Codes - 05/22/16 1107    Functional Assessment Tool Used Clinical judgement.   Functional Limitation Mobility: Walking and moving around   Mobility: Walking and Moving Around Current Status [phone]) At least 40 percent but less than 60 percent impaired, limited or restricted   Mobility: Walking and Moving Around Goal Status [phone]) At least 40 percent but less than 60 percent impaired, limited or restricted      Problem List Patient Active Problem List   Diagnosis Date Noted  . History of arthroplasty of right knee 03/05/2016  . OA (osteoarthritis) of knee 02/13/2016  . Leg edema 12/07/2015  . Preop cardiovascular exam 12/07/2015  . B12 deficiency 12/08/2014  . Metabolic syndrome [id number]  . Bradycardia 05/31/2014  . Hyperlipemia 04/28/2013  . Vitamin D deficiency 04/28/2013  . Lumbar spondylosis with myelopathy 04/28/2013  . Osteoarthritis 04/28/2013  . Hypertension 04/28/2013  . Incontinence of urine 04/28/2013  . Hypothyroidism 04/28/2013  . Cough 08/21/2011  . Abnormal CXR 08/21/2011    Miranda Wilson, [place] MPT 05-22-2016, 11:27 AM  [facility] [address] [place], [place], [address] Phone: [phone]   Fax:  [phone]  Name: Miranda Wilson MRN: [id number] Date of Birth:  [birth year]

## 2016-05-21 ENCOUNTER — Encounter: Payer: Self-pay | Admitting: Physical Therapy

## 2016-05-21 ENCOUNTER — Ambulatory Visit: Payer: BLUE CROSS/BLUE SHIELD | Admitting: Physical Therapy

## 2016-05-21 DIAGNOSIS — M25561 Pain in right knee: Secondary | ICD-10-CM

## 2016-05-21 DIAGNOSIS — M25661 Stiffness of right knee, not elsewhere classified: Secondary | ICD-10-CM

## 2016-05-21 DIAGNOSIS — R601 Generalized edema: Secondary | ICD-10-CM

## 2016-05-21 NOTE — Therapy (Signed)
[facility] [place], [place], [address] Phone: [phone]   Fax:  [phone]  Physical Therapy Treatment  Patient Details  Name: Miranda Wilson MRN: [id number] Date of Birth: [birth year] Referring Provider: Gorden Harms, MD  Encounter Date: 05/21/2016      PT End of Session - 05/21/16 1107    Visit Number 30   Number of Visits 35   Date for PT Re-Evaluation 06/01/16   PT Start Time 1031   PT Stop Time 1130   PT Time Calculation (min) 59 min   Activity Tolerance Patient tolerated treatment well   Behavior During Therapy [facility] for tasks assessed/performed      Past Medical History:  Diagnosis Date  . At risk for sleep apnea   . Bilateral edema of lower extremity    left > right --  wear compression hose  . Cervical spondylosis   . Chronic constipation   . GERD (gastroesophageal reflux disease)   . History of colon polyps   . History of idiopathic seizure    AGE 65 to 20  X5  --  UNKNOW IDIOLOGY , PER PT WAS ANEMIC AT THE TIME--  NONE SINCE  . History of tachycardia    S/P  RADIOACTIVE IODINE ABLATION OF THYROID  . Hyperlipidemia   . Hypertension   . Hypothyroidism, postradioiodine therapy    AGE 65  . IBS (irritable bowel syndrome)   . OA (osteoarthritis)    shoulders  left > right  . PMB (postmenopausal bleeding)   . S/P radioactive iodine thyroid ablation   . Urge urinary incontinence   . Varicose veins   . Vein disorder    LEFT ANKLE VEIN VALVE REFLUX WITH DECREASED CIRCULATION     Past Surgical History:  Procedure Laterality Date  . CATARACT EXTRACTION W/ INTRAOCULAR LENS  IMPLANT, BILATERAL  right 2011//  left 2013  . CESAREAN SECTION  1979  . CLOSED LEFT KNEE MANIPULATION AND RIGHT KNEE CORTISONE INJECTION  02-20-2010  . COLONOSCOPY W/ POLYPECTOMY  10-25-2008  . DILATION AND CURETTAGE OF UTERUS    . ENDOVENOUS ABLATION SAPHENOUS VEIN W/ LASER  04/2007  . EYE SURGERY    . HYSTEROSCOPY W/D&C  N/A 11/11/2014   Procedure: DILATATION AND CURETTAGE /HYSTEROSCOPY;  Surgeon: Gus Height, MD;  Location: [facility];  Service: Gynecology;  Laterality: N/A;  . JOINT REPLACEMENT    . LAPAROSCOPIC CHOLECYSTECTOMY  1993   and BILATERAL TUBAL LIGATION  . LEFT KNEE ARTHROTOMY W/ LYSIS ADHESIONS  12-01-2010  . TONSILLECTOMY  1955  . TOTAL KNEE ARTHROPLASTY Left 12-19-2009  . TOTAL KNEE ARTHROPLASTY Right 02/13/2016   Procedure: RIGHT TOTAL KNEE ARTHROPLASTY;  Surgeon: Gaynelle Arabian, MD;  Location: [facility];  Service: Orthopedics;  Laterality: Right;  Marland Kitchen [place] to 2010   includes laser and phlebectomies    There were no vitals filed for this visit.      Subjective Assessment - 05/21/16 1053    Subjective Patient reported her knee very stiff sore [place]   Pertinent History HTN, venous insuffieciency, back problems, disc problems, L TKR 2011   Patient Stated Goals walk and stand with good balance/comfort; regain ROM   Currently in Pain? Yes   Pain Score 2    Pain Location Knee   Pain Orientation Right   Pain Descriptors / Indicators Tightness   Pain Type Surgical pain   Pain Onset More than a month ago  Pain Frequency Intermittent   Aggravating Factors  ROM   Pain Relieving Factors at rest            [facility] PT Assessment - 05/21/16 0001      AROM   Overall AROM  Deficits   AROM Assessment Site Knee   Right/Left Knee Right   Right Knee Extension -15   Right Knee Flexion 98     PROM   Right/Left Knee Right   Right Knee Extension -11   Right Knee Flexion 106                     [facility] Adult PT Treatment/Exercise - 05/21/16 0001      Knee/Hip Exercises: Stretches   Knee: Self-Stretch to increase Flexion Right;3 reps;30 seconds     Knee/Hip Exercises: Aerobic   Nustep Level 6 x 15 minutes.     Knee/Hip Exercises: Standing   Rocker Board 3 minutes     Electrical Stimulation   Electrical Stimulation  Location RT knee.   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[employer]   Electrical Stimulation Parameters 1-10hz    Electrical Stimulation Goals Edema;Pain     Vasopneumatic   Number Minutes Vasopneumatic  15 minutes   Vasopnuematic Location  Knee   Vasopneumatic Pressure Low     Manual Therapy   Manual Therapy Passive ROM   Passive ROM PROM for right knee flexion and ext with genlte holds, then patella mobs                  PT Short Term Goals - 05/02/16 1209      PT SHORT TERM GOAL #1   Title Decreased edema in R knee to within 1.5 cm of L   Status Achieved           PT Long Term Goals - 05/21/16 1109      PT LONG TERM GOAL #1   Title I with HEP   Time 6   Period Weeks   Status Achieved     PT LONG TERM GOAL #2   Title Improved R knee ROM -5 to 120 degrees to improve function   Time 6   Period Weeks   Status On-going  AROM -15-98 degrees 05/21/16     PT LONG TERM GOAL #3   Title Patient able to amb 300 feet safely without AD   Time 6   Period Weeks   Status On-going  patient using [facility] 05/21/16     PT LONG TERM GOAL #4   Title Able to climb stairs with reciprocal gait and 5/5 knee strength   Time 6   Period Weeks   Status On-going               Plan - 05/21/16 1110    Clinical Impression Statement Patient progressing with some decreased ROM due to being out of town for a week. Patient reported doing continued self ROM stretches while away. Patient has c/o pain and tightness above kneecap and thigh area. FOTO 54% limitation (initial 67%) Patient goals ongoing due to ROM and strength dficits.   Rehab Potential Excellent   PT Frequency 3x / week   PT Duration 6 weeks   PT Treatment/Interventions ADLs/Self Care Home Management;Electrical Stimulation;Cryotherapy;Therapeutic exercise;Manual techniques;Vasopneumatic Device;Passive range of motion;Patient/family education;Gait training;Stair training;Balance training;Neuromuscular re-education;Scar mobilization    PT Next Visit Plan Continue per TKR protocol with modalities PRN for pain, continue patella mobilization (MD. Maureen Ralphs 05/29/16)   Consulted and Agree with Plan of  Care Patient      Patient will benefit from skilled therapeutic intervention in order to improve the following deficits and impairments:  Abnormal gait, Decreased range of motion, Pain, Decreased scar mobility, Decreased strength, Increased edema, Decreased mobility  Visit Diagnosis: Pain in right knee  Stiffness of right knee, not elsewhere classified  Generalized edema     Problem List Patient Active Problem List   Diagnosis Date Noted  . History of arthroplasty of right knee 03/05/2016  . OA (osteoarthritis) of knee 02/13/2016  . Leg edema 12/07/2015  . Preop cardiovascular exam 12/07/2015  . B12 deficiency 12/08/2014  . Metabolic syndrome [id number]  . Bradycardia 05/31/2014  . Hyperlipemia 04/28/2013  . Vitamin D deficiency 04/28/2013  . Lumbar spondylosis with myelopathy 04/28/2013  . Osteoarthritis 04/28/2013  . Hypertension 04/28/2013  . Incontinence of urine 04/28/2013  . Hypothyroidism 04/28/2013  . Cough 08/21/2011  . Abnormal CXR 08/21/2011    DUNFORD, CHRISTINA P, PTA 05/21/2016, 11:57 AM  Ladean Raya, PTA 05/21/16 11:58 AM  [facility] [place], [place], [address] Phone: [phone]   Fax:  [phone]  Name: CHALIA MORUA MRN: [id number] Date of Birth: 06-26-1951

## 2016-05-23 ENCOUNTER — Encounter: Payer: Self-pay | Admitting: Physical Therapy

## 2016-05-23 ENCOUNTER — Ambulatory Visit: Payer: BLUE CROSS/BLUE SHIELD | Admitting: Physical Therapy

## 2016-05-23 DIAGNOSIS — M25561 Pain in right knee: Secondary | ICD-10-CM

## 2016-05-23 DIAGNOSIS — R601 Generalized edema: Secondary | ICD-10-CM

## 2016-05-23 DIAGNOSIS — M25661 Stiffness of right knee, not elsewhere classified: Secondary | ICD-10-CM

## 2016-05-23 NOTE — Therapy (Signed)
[facility] [place], [place], [address] Phone: [phone]   Fax:  [phone]  Physical Therapy Treatment  Patient Details  Name: JAYDALYNN LALLEY MRN: [id number] Date of Birth: Dec 02, 1950 Referring Provider: Gorden Harms, MD  Encounter Date: 05/23/2016      PT End of Session - 05/23/16 1115    Visit Number 31   Number of Visits 35   Date for PT Re-Evaluation 06/01/16   PT Start Time 1031   PT Stop Time 1131   PT Time Calculation (min) 60 min   Activity Tolerance Patient tolerated treatment well   Behavior During Therapy [facility] for tasks assessed/performed      Past Medical History:  Diagnosis Date  . At risk for sleep apnea   . Bilateral edema of lower extremity    left > right --  wear compression hose  . Cervical spondylosis   . Chronic constipation   . GERD (gastroesophageal reflux disease)   . History of colon polyps   . History of idiopathic seizure    AGE 65 to 20  X5  --  UNKNOW IDIOLOGY , PER PT WAS ANEMIC AT THE TIME--  NONE SINCE  . History of tachycardia    S/P  RADIOACTIVE IODINE ABLATION OF THYROID  . Hyperlipidemia   . Hypertension   . Hypothyroidism, postradioiodine therapy    AGE 65  . IBS (irritable bowel syndrome)   . OA (osteoarthritis)    shoulders  left > right  . PMB (postmenopausal bleeding)   . S/P radioactive iodine thyroid ablation   . Urge urinary incontinence   . Varicose veins   . Vein disorder    LEFT ANKLE VEIN VALVE REFLUX WITH DECREASED CIRCULATION     Past Surgical History:  Procedure Laterality Date  . CATARACT EXTRACTION W/ INTRAOCULAR LENS  IMPLANT, BILATERAL  right 2011//  left 2013  . CESAREAN SECTION  1979  . CLOSED LEFT KNEE MANIPULATION AND RIGHT KNEE CORTISONE INJECTION  02-20-2010  . COLONOSCOPY W/ POLYPECTOMY  10-25-2008  . DILATION AND CURETTAGE OF UTERUS    . ENDOVENOUS ABLATION SAPHENOUS VEIN W/ LASER  04/2007  . EYE SURGERY    . HYSTEROSCOPY W/D&C  N/A 11/11/2014   Procedure: DILATATION AND CURETTAGE /HYSTEROSCOPY;  Surgeon: Gus Height, MD;  Location: [facility];  Service: Gynecology;  Laterality: N/A;  . JOINT REPLACEMENT    . LAPAROSCOPIC CHOLECYSTECTOMY  1993   and BILATERAL TUBAL LIGATION  . LEFT KNEE ARTHROTOMY W/ LYSIS ADHESIONS  12-01-2010  . TONSILLECTOMY  1955  . TOTAL KNEE ARTHROPLASTY Left 12-19-2009  . TOTAL KNEE ARTHROPLASTY Right 02/13/2016   Procedure: RIGHT TOTAL KNEE ARTHROPLASTY;  Surgeon: Gaynelle Arabian, MD;  Location: [facility];  Service: Orthopedics;  Laterality: Right;  Marland Kitchen [place] to 2010   includes laser and phlebectomies    There were no vitals filed for this visit.      Subjective Assessment - 05/23/16 1039    Subjective Patient tolerated last treatment well and reported ongoing stiffness today   Pertinent History HTN, venous insuffieciency, back problems, disc problems, L TKR 2011   Patient Stated Goals walk and stand with good balance/comfort; regain ROM   Currently in Pain? Yes   Pain Score 2    Pain Location Knee   Pain Orientation Right   Pain Descriptors / Indicators Tightness   Pain Type Surgical pain   Pain Onset More than a month ago   Pain  Frequency Intermittent   Aggravating Factors  ROM   Pain Relieving Factors at rest            [facility] PT Assessment - 05/23/16 0001      AROM   Overall AROM  Deficits   AROM Assessment Site Knee   Right/Left Knee Right   Right Knee Extension -15   Right Knee Flexion 99     PROM   Right Knee Extension -10   Right Knee Flexion 107                     [facility] Adult PT Treatment/Exercise - 05/23/16 0001      Knee/Hip Exercises: Aerobic   Nustep Level 6 x 15 minutes.     Knee/Hip Exercises: Machines for Strengthening   Cybex Knee Extension 10# 2x10   Cybex Knee Flexion 10# 3x10     Electrical Stimulation   Electrical Stimulation Location RT knee.   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[employer]   Electrical  Stimulation Parameters 1-10hz    Electrical Stimulation Goals Edema;Pain     Vasopneumatic   Number Minutes Vasopneumatic  15 minutes   Vasopnuematic Location  Knee   Vasopneumatic Pressure Low     Manual Therapy   Manual Therapy Passive ROM   Passive ROM PROM for right knee flexion and ext with genlte holds, then patella mobs                  PT Short Term Goals - 05/02/16 1209      PT SHORT TERM GOAL #1   Title Decreased edema in R knee to within 1.5 cm of L   Status Achieved           PT Long Term Goals - 05/21/16 1109      PT LONG TERM GOAL #1   Title I with HEP   Time 6   Period Weeks   Status Achieved     PT LONG TERM GOAL #2   Title Improved R knee ROM -5 to 120 degrees to improve function   Time 6   Period Weeks   Status On-going  AROM -15-98 degrees 05/21/16     PT LONG TERM GOAL #3   Title Patient able to amb 300 feet safely without AD   Time 6   Period Weeks   Status On-going  patient using [facility] 05/21/16     PT LONG TERM GOAL #4   Title Able to climb stairs with reciprocal gait and 5/5 knee strength   Time 6   Period Weeks   Status On-going               Plan - 05/23/16 1119    Clinical Impression Statement Patient progressing with ROM slowly due to edema and stiffness. Patient has improved ROM by a degree today for right knee flexion and ext today. Patient started some knee strengthening exercises with some difficuty due to weakness. Goals ongoing at this time due to strength, and ROM deficits.   Rehab Potential Excellent   PT Frequency 3x / week   PT Duration 6 weeks   PT Treatment/Interventions ADLs/Self Care Home Management;Electrical Stimulation;Cryotherapy;Therapeutic exercise;Manual techniques;Vasopneumatic Device;Passive range of motion;Patient/family education;Gait training;Stair training;Balance training;Neuromuscular re-education;Scar mobilization   PT Next Visit Plan Continue per TKR protocol with modalities PRN for  pain, continue patella mobilization (MD. Maureen Ralphs 05/29/16)   Consulted and Agree with Plan of Care Patient      Patient will benefit from skilled therapeutic intervention  in order to improve the following deficits and impairments:  Abnormal gait, Decreased range of motion, Pain, Decreased scar mobility, Decreased strength, Increased edema, Decreased mobility  Visit Diagnosis: Pain in right knee  Stiffness of right knee, not elsewhere classified  Generalized edema     Problem List Patient Active Problem List   Diagnosis Date Noted  . History of arthroplasty of right knee 03/05/2016  . OA (osteoarthritis) of knee 02/13/2016  . Leg edema 12/07/2015  . Preop cardiovascular exam 12/07/2015  . B12 deficiency 12/08/2014  . Metabolic syndrome XX123456  . Bradycardia 05/31/2014  . Hyperlipemia 04/28/2013  . Vitamin D deficiency 04/28/2013  . Lumbar spondylosis with myelopathy 04/28/2013  . Osteoarthritis 04/28/2013  . Hypertension 04/28/2013  . Incontinence of urine 04/28/2013  . Hypothyroidism 04/28/2013  . Cough 08/21/2011  . Abnormal CXR 08/21/2011    Phillips Climes, PTA 05/23/2016, 12:09 PM  [facility] [address] [place], [place], [address] Phone: [phone]   Fax:  [phone]  Name: ERSA SENSENEY MRN: [id number] Date of Birth: 1951/04/29

## 2016-05-25 ENCOUNTER — Encounter: Payer: Self-pay | Admitting: Physical Therapy

## 2016-05-25 ENCOUNTER — Ambulatory Visit: Payer: BLUE CROSS/BLUE SHIELD | Admitting: Physical Therapy

## 2016-05-25 DIAGNOSIS — R601 Generalized edema: Secondary | ICD-10-CM

## 2016-05-25 DIAGNOSIS — M25561 Pain in right knee: Secondary | ICD-10-CM | POA: Diagnosis not present

## 2016-05-25 DIAGNOSIS — M25661 Stiffness of right knee, not elsewhere classified: Secondary | ICD-10-CM

## 2016-05-25 NOTE — Therapy (Signed)
[facility] [place], [place], [address] Phone: [phone]   Fax:  [phone]  Physical Therapy Treatment  Patient Details  Name: Miranda Wilson MRN: [id number] Date of Birth: [birth year] Referring Provider: Gorden Harms, MD  Encounter Date: 05/25/2016      PT End of Session - 05/25/16 1039    Visit Number 32   Number of Visits 35   Date for PT Re-Evaluation 06/01/16   PT Start Time 1032   PT Stop Time 1127   PT Time Calculation (min) 55 min   Activity Tolerance Patient tolerated treatment well   Behavior During Therapy [facility] for tasks assessed/performed      Past Medical History:  Diagnosis Date  . At risk for sleep apnea   . Bilateral edema of lower extremity    left > right --  wear compression hose  . Cervical spondylosis   . Chronic constipation   . GERD (gastroesophageal reflux disease)   . History of colon polyps   . History of idiopathic seizure    AGE 89 to 20  X5  --  UNKNOW IDIOLOGY , PER PT WAS ANEMIC AT THE TIME--  NONE SINCE  . History of tachycardia    S/P  RADIOACTIVE IODINE ABLATION OF THYROID  . Hyperlipidemia   . Hypertension   . Hypothyroidism, postradioiodine therapy    AGE 73  . IBS (irritable bowel syndrome)   . OA (osteoarthritis)    shoulders  left > right  . PMB (postmenopausal bleeding)   . S/P radioactive iodine thyroid ablation   . Urge urinary incontinence   . Varicose veins   . Vein disorder    LEFT ANKLE VEIN VALVE REFLUX WITH DECREASED CIRCULATION     Past Surgical History:  Procedure Laterality Date  . CATARACT EXTRACTION W/ INTRAOCULAR LENS  IMPLANT, BILATERAL  right 2011//  left 2013  . CESAREAN SECTION  1979  . CLOSED LEFT KNEE MANIPULATION AND RIGHT KNEE CORTISONE INJECTION  02-20-2010  . COLONOSCOPY W/ POLYPECTOMY  10-25-2008  . DILATION AND CURETTAGE OF UTERUS    . ENDOVENOUS ABLATION SAPHENOUS VEIN W/ LASER  04/2007  . EYE SURGERY    . HYSTEROSCOPY W/D&C  N/A 11/11/2014   Procedure: DILATATION AND CURETTAGE /HYSTEROSCOPY;  Surgeon: Gus Height, MD;  Location[facility];  Service: Gynecology;  Laterality: N/A;  . JOINT REPLACEMENT    . LAPAROSCOPIC CHOLECYSTECTOMY  1993   and BILATERAL TUBAL LIGATION  . LEFT KNEE ARTHROTOMY W/ LYSIS ADHESIONS  12-01-2010  . TONSILLECTOMY  1955  . TOTAL KNEE ARTHROPLASTY Left 12-19-2009  . TOTAL KNEE ARTHROPLASTY Right 02/13/2016   Procedure: RIGHT TOTAL KNEE ARTHROPLASTY;  Surgeon: Gaynelle Arabian, MD;  Location: [facility];  Service: Orthopedics;  Laterality: Right;  Marland Kitchen [place] to 2010   includes laser and phlebectomies    There were no vitals filed for this visit.      Subjective Assessment - 05/25/16 1040    Subjective Reports that this morning on her bike she was able to go one mile in less than 15 minutes whereas when she first started she was able to complete only 2/10 of a mile in more than 15 minutes.   Pertinent History HTN, venous insuffieciency, back problems, disc problems, L TKR 2011   Patient Stated Goals walk and stand with good balance/comfort; regain ROM   Currently in Pain? Yes   Pain Score 5    Pain Location Knee  Pain Orientation Right   Pain Descriptors / Indicators Sore   Pain Type Surgical pain   Pain Onset More than a month ago            [facility] PT Assessment - 05/25/16 0001      Assessment   Medical Diagnosis S/P R TKR   Onset Date/Surgical Date 02/13/16   Next MD Visit 05/29/2016                     [facility] Adult PT Treatment/Exercise - 05/25/16 0001      Knee/Hip Exercises: Aerobic   Nustep Level 6 x 15 minutes.     Knee/Hip Exercises: Machines for Strengthening   Cybex Knee Extension 10# 2x10   Cybex Knee Flexion 10# 3x10     Knee/Hip Exercises: Standing   Forward Lunges Right;2 sets;10 reps;3 seconds   Rocker Board 3 minutes     Modalities   Modalities Electrical Stimulation;Vasopneumatic     Psychologist, counselling Location R knee   Electrical Stimulation Action IFC   Electrical Stimulation Parameters 1-10 hz x15 min   Electrical Stimulation Goals Edema     Vasopneumatic   Number Minutes Vasopneumatic  15 minutes   Vasopnuematic Location  Knee   Vasopneumatic Pressure Low   Vasopneumatic Temperature  40     Manual Therapy   Manual Therapy Passive ROM;Soft tissue mobilization   Soft tissue mobilization R patellar mobs in sitting with R knee in extension in directions of sup/inf, L/R to promote proper mobility   Passive ROM Contract/relax of R knee in sitting to increase flexion                   PT Short Term Goals - 05/02/16 1209      PT SHORT TERM GOAL #1   Title Decreased edema in R knee to within 1.5 cm of L   Status Achieved           PT Long Term Goals - 05/21/16 1109      PT LONG TERM GOAL #1   Title I with HEP   Time 6   Period Weeks   Status Achieved     PT LONG TERM GOAL #2   Title Improved R knee ROM -5 to 120 degrees to improve function   Time 6   Period Weeks   Status On-going  AROM -15-98 degrees 05/21/16     PT LONG TERM GOAL #3   Title Patient able to amb 300 feet safely without AD   Time 6   Period Weeks   Status On-going  patient using [facility] 05/21/16     PT LONG TERM GOAL #4   Title Able to climb stairs with reciprocal gait and 5/5 knee strength   Time 6   Period Weeks   Status On-going               Plan - 05/25/16 1154    Clinical Impression Statement Patient continues to be progressing slowly with R knee ROM and strengthening. Patient used mostly RLE on machine strengthening today and continues to be limited as LLE does not have the ROM of the RLE. Patient able to complete therapeutic exercises without report of increased pain. Pitting edema noted in R lower leg during contract/relax in sitting to improve R knee flexion. Normal modalities response noted following removal of the modalities. Goals  remain on-going secondary to deficits with R knee strength and ROM, continued  use of SBQC for ambulation, and stair ambulation not assessed.    Rehab Potential Excellent   PT Frequency 3x / week   PT Duration 6 weeks   PT Treatment/Interventions ADLs/Self Care Home Management;Electrical Stimulation;Cryotherapy;Therapeutic exercise;Manual techniques;Vasopneumatic Device;Passive range of motion;Patient/family education;Gait training;Stair training;Balance training;Neuromuscular re-education;Scar mobilization   PT Next Visit Plan Continue per TKR protocol with modalities PRN for pain, continue patella mobilization (MD. Maureen Ralphs 05/29/16)   Consulted and Agree with Plan of Care Patient      Patient will benefit from skilled therapeutic intervention in order to improve the following deficits and impairments:  Abnormal gait, Decreased range of motion, Pain, Decreased scar mobility, Decreased strength, Increased edema, Decreased mobility  Visit Diagnosis: Pain in right knee  Stiffness of right knee, not elsewhere classified  Generalized edema     Problem List Patient Active Problem List   Diagnosis Date Noted  . History of arthroplasty of right knee 03/05/2016  . OA (osteoarthritis) of knee 02/13/2016  . Leg edema 12/07/2015  . Preop cardiovascular exam 12/07/2015  . B12 deficiency 12/08/2014  . Metabolic syndrome XX123456  . Bradycardia 05/31/2014  . Hyperlipemia 04/28/2013  . Vitamin D deficiency 04/28/2013  . Lumbar spondylosis with myelopathy 04/28/2013  . Osteoarthritis 04/28/2013  . Hypertension 04/28/2013  . Incontinence of urine 04/28/2013  . Hypothyroidism 04/28/2013  . Cough 08/21/2011  . Abnormal CXR 08/21/2011    Wynelle Fanny, PTA 05/25/2016, 11:59 AM  [facility] [address] [place], [place], [address] Phone: [phone]   Fax:  [phone]  Name: Miranda Wilson MRN: [id number] Date of Birth: October 26, 1951

## 2016-05-28 ENCOUNTER — Ambulatory Visit: Payer: BLUE CROSS/BLUE SHIELD | Admitting: Physical Therapy

## 2016-05-28 DIAGNOSIS — M25561 Pain in right knee: Secondary | ICD-10-CM

## 2016-05-28 DIAGNOSIS — M25661 Stiffness of right knee, not elsewhere classified: Secondary | ICD-10-CM

## 2016-05-28 DIAGNOSIS — R601 Generalized edema: Secondary | ICD-10-CM

## 2016-05-28 NOTE — Therapy (Signed)
[facility] [place], [place], [address] Phone: [phone]   Fax:  [phone]  Physical Therapy Treatment  Patient Details  Name: Miranda Wilson MRN: [id number] Date of Birth: 09-10-1951 Referring Provider: Gorden Harms, MD  Encounter Date: 05/28/2016      PT End of Session - 05/28/16 1143    Visit Number 54   PT Start Time [id number]   PT Stop Time [id number]   PT Time Calculation (min) 64 min   Activity Tolerance Patient tolerated treatment well   Behavior During Therapy [facility] for tasks assessed/performed      Past Medical History:  Diagnosis Date  . At risk for sleep apnea   . Bilateral edema of lower extremity    left > right --  wear compression hose  . Cervical spondylosis   . Chronic constipation   . GERD (gastroesophageal reflux disease)   . History of colon polyps   . History of idiopathic seizure    AGE 9 to 20  X5  --  UNKNOW IDIOLOGY , PER PT WAS ANEMIC AT THE TIME--  NONE SINCE  . History of tachycardia    S/P  RADIOACTIVE IODINE ABLATION OF THYROID  . Hyperlipidemia   . Hypertension   . Hypothyroidism, postradioiodine therapy    AGE 47  . IBS (irritable bowel syndrome)   . OA (osteoarthritis)    shoulders  left > right  . PMB (postmenopausal bleeding)   . S/P radioactive iodine thyroid ablation   . Urge urinary incontinence   . Varicose veins   . Vein disorder    LEFT ANKLE VEIN VALVE REFLUX WITH DECREASED CIRCULATION     Past Surgical History:  Procedure Laterality Date  . CATARACT EXTRACTION W/ INTRAOCULAR LENS  IMPLANT, BILATERAL  right 2011//  left 2013  . CESAREAN SECTION  1979  . CLOSED LEFT KNEE MANIPULATION AND RIGHT KNEE CORTISONE INJECTION  02-20-2010  . COLONOSCOPY W/ POLYPECTOMY  10-25-2008  . DILATION AND CURETTAGE OF UTERUS    . ENDOVENOUS ABLATION SAPHENOUS VEIN W/ LASER  04/2007  . EYE SURGERY    . HYSTEROSCOPY W/D&C N/A 11/11/2014   Procedure: DILATATION AND CURETTAGE  /HYSTEROSCOPY;  Surgeon: Gus Height, MD;  Location: [facility];  Service: Gynecology;  Laterality: N/A;  . JOINT REPLACEMENT    . LAPAROSCOPIC CHOLECYSTECTOMY  1993   and BILATERAL TUBAL LIGATION  . LEFT KNEE ARTHROTOMY W/ LYSIS ADHESIONS  12-01-2010  . TONSILLECTOMY  1955  . TOTAL KNEE ARTHROPLASTY Left 12-19-2009  . TOTAL KNEE ARTHROPLASTY Right 02/13/2016   Procedure: RIGHT TOTAL KNEE ARTHROPLASTY;  Surgeon: Gaynelle Arabian, MD;  Location: [facility];  Service: Orthopedics;  Laterality: Right;  Marland Kitchen [place] to 2010   includes laser and phlebectomies    There were no vitals filed for this visit.      Subjective Assessment - 05/28/16 1037    Subjective goes to MD tomorrow; today knee feels stiff.  hasn't had rx pain meds since Friday. L knee aches today but R knee is okay.   Pertinent History HTN, venous insuffieciency, back problems, disc problems, L TKR 2011   Patient Stated Goals walk and stand with good balance/comfort; regain ROM   Currently in Pain? No/denies            [facility] PT Assessment - 05/28/16 1101      AROM   Right Knee Extension -11   Right Knee Flexion 90  PROM   Right Knee Extension -6   Right Knee Flexion 98                     [facility] Adult PT Treatment/Exercise - 05/28/16 1038      Ambulation/Gait   Stairs Yes   Stairs Assistance 6: Modified independent (Device/Increase time)   Stair Management Technique One rail Right;Step to pattern;Forwards;With cane   Number of Stairs 8   Height of Stairs 8   Gait Comments LLE circumduction and unable to descend stairs reciprocally with RLE leading     Knee/Hip Exercises: Aerobic   Nustep Level 6 x 15 minutes.     Knee/Hip Exercises: Machines for Strengthening   Cybex Knee Extension 10# 2x10   Cybex Knee Flexion 20# 3x10     Knee/Hip Exercises: Standing   Hip Flexion Both;2 sets;10 reps;Knee straight   Hip Flexion Limitations 3#   Hip Abduction Both;2 sets;10  reps;Knee straight   Abduction Limitations 3#   Hip Extension Both;2 sets;10 reps;Knee straight   Extension Limitations 3#     Electrical Stimulation   Electrical Stimulation Location R knee   Electrical Stimulation Action IFC   Electrical Stimulation Parameters to tolerance x 15 min   Electrical Stimulation Goals Edema;Pain     Vasopneumatic   Number Minutes Vasopneumatic  15 minutes   Vasopnuematic Location  Knee   Vasopneumatic Pressure Low   Vasopneumatic Temperature  40     Manual Therapy   Manual Therapy Passive ROM;Soft tissue mobilization   Soft tissue mobilization R patellar mobs in supine with R knee in extension in directions of sup/inf, L/R to promote proper mobility   Passive ROM R knee flexion and extension                  PT Short Term Goals - 05/02/16 1209      PT SHORT TERM GOAL #1   Title Decreased edema in R knee to within 1.5 cm of L   Status Achieved           PT Long Term Goals - 05/28/16 1144      PT LONG TERM GOAL #1   Title I with HEP   Status Achieved     PT LONG TERM GOAL #2   Title Improved R knee ROM -5 to 120 degrees to improve function   Status On-going     PT LONG TERM GOAL #3   Title Patient able to amb 300 feet safely without AD   Baseline continues to use [facility] for community ambulation   Status On-going     PT LONG TERM GOAL #4   Title Able to climb stairs with reciprocal gait and 5/5 knee strength   Baseline limited mainly by L knee rather than R knee   Status Achieved               Plan - 05/28/16 1144    Clinical Impression Statement Pt slowly progressing towards goals, with ROM slightly decreased today and likely due to decreasing rx pain medication.  Feel pt overall doing well functionally and limited with mobility due to ROM and decreased ROM in L knee.  Anticipate renewal v/s d/c depending on MD visit.   PT Treatment/Interventions ADLs/Self Care Home Management;Electrical  Stimulation;Cryotherapy;Therapeutic exercise;Manual techniques;Vasopneumatic Device;Passive range of motion;Patient/family education;Gait training;Stair training;Balance training;Neuromuscular re-education;Scar mobilization   PT Next Visit Plan Continue per TKR protocol with modalities PRN for pain, continue patella mobilization (renew v/s d/c depending  on MD visit)   Consulted and Agree with Plan of Care Patient      Patient will benefit from skilled therapeutic intervention in order to improve the following deficits and impairments:  Abnormal gait, Decreased range of motion, Pain, Decreased scar mobility, Decreased strength, Increased edema, Decreased mobility  Visit Diagnosis: Pain in right knee  Stiffness of right knee, not elsewhere classified  Generalized edema     Problem List Patient Active Problem List   Diagnosis Date Noted  . History of arthroplasty of right knee 03/05/2016  . OA (osteoarthritis) of knee 02/13/2016  . Leg edema 12/07/2015  . Preop cardiovascular exam 12/07/2015  . B12 deficiency 12/08/2014  . Metabolic syndrome [id number]  . Bradycardia 05/31/2014  . Hyperlipemia 04/28/2013  . Vitamin D deficiency 04/28/2013  . Lumbar spondylosis with myelopathy 04/28/2013  . Osteoarthritis 04/28/2013  . Hypertension 04/28/2013  . Incontinence of urine 04/28/2013  . Hypothyroidism 04/28/2013  . Cough 08/21/2011  . Abnormal CXR 08/21/2011      Laureen Abrahams, PT, DPT 05/28/16 11:56 AM    [facility] [address] [place], [place], [address] Phone: [phone]   Fax:  [phone]  Name: Miranda Wilson MRN: [id number] Date of Birth: 03-25-1951

## 2016-05-31 ENCOUNTER — Ambulatory Visit: Payer: BLUE CROSS/BLUE SHIELD | Admitting: Physical Therapy

## 2016-06-06 ENCOUNTER — Ambulatory Visit: Payer: BLUE CROSS/BLUE SHIELD | Attending: Orthopedic Surgery | Admitting: Physical Therapy

## 2016-06-06 ENCOUNTER — Encounter: Payer: Self-pay | Admitting: Physical Therapy

## 2016-06-06 DIAGNOSIS — R601 Generalized edema: Secondary | ICD-10-CM | POA: Diagnosis present

## 2016-06-06 DIAGNOSIS — M25561 Pain in right knee: Secondary | ICD-10-CM | POA: Diagnosis not present

## 2016-06-06 DIAGNOSIS — M25661 Stiffness of right knee, not elsewhere classified: Secondary | ICD-10-CM

## 2016-06-06 NOTE — Therapy (Signed)
[facility] [place], [place], [address] Phone: [phone]   Fax:  [phone]  Physical Therapy Treatment  Patient Details  Name: Miranda Wilson MRN: [id number] Date of Birth: [birth year] Referring Provider: Gorden Harms, MD  Encounter Date: 06/06/2016      PT End of Session - 06/06/16 0912    Visit Number 34   Number of Visits 35   Date for PT Re-Evaluation 06/01/16   PT Start Time 0901   PT Stop Time 1001   PT Time Calculation (min) 60 min   Activity Tolerance Patient tolerated treatment well   Behavior During Therapy [facility] for tasks assessed/performed      Past Medical History:  Diagnosis Date  . At risk for sleep apnea   . Bilateral edema of lower extremity    left > right --  wear compression hose  . Cervical spondylosis   . Chronic constipation   . GERD (gastroesophageal reflux disease)   . History of colon polyps   . History of idiopathic seizure    AGE 46 to 20  X5  --  UNKNOW IDIOLOGY , PER PT WAS ANEMIC AT THE TIME--  NONE SINCE  . History of tachycardia    S/P  RADIOACTIVE IODINE ABLATION OF THYROID  . Hyperlipidemia   . Hypertension   . Hypothyroidism, postradioiodine therapy    AGE 15  . IBS (irritable bowel syndrome)   . OA (osteoarthritis)    shoulders  left > right  . PMB (postmenopausal bleeding)   . S/P radioactive iodine thyroid ablation   . Urge urinary incontinence   . Varicose veins   . Vein disorder    LEFT ANKLE VEIN VALVE REFLUX WITH DECREASED CIRCULATION     Past Surgical History:  Procedure Laterality Date  . CATARACT EXTRACTION W/ INTRAOCULAR LENS  IMPLANT, BILATERAL  right 2011//  left 2013  . CESAREAN SECTION  1979  . CLOSED LEFT KNEE MANIPULATION AND RIGHT KNEE CORTISONE INJECTION  02-20-2010  . COLONOSCOPY W/ POLYPECTOMY  10-25-2008  . DILATION AND CURETTAGE OF UTERUS    . ENDOVENOUS ABLATION SAPHENOUS VEIN W/ LASER  04/2007  . EYE SURGERY    . HYSTEROSCOPY W/D&C N/A  11/11/2014   Procedure: DILATATION AND CURETTAGE /HYSTEROSCOPY;  Surgeon: Gus Height, MD;  Location: [facility];  Service: Gynecology;  Laterality: N/A;  . JOINT REPLACEMENT    . LAPAROSCOPIC CHOLECYSTECTOMY  1993   and BILATERAL TUBAL LIGATION  . LEFT KNEE ARTHROTOMY W/ LYSIS ADHESIONS  12-01-2010  . TONSILLECTOMY  1955  . TOTAL KNEE ARTHROPLASTY Left 12-19-2009  . TOTAL KNEE ARTHROPLASTY Right 02/13/2016   Procedure: RIGHT TOTAL KNEE ARTHROPLASTY;  Surgeon: Gaynelle Arabian, MD;  Location: [facility];  Service: Orthopedics;  Laterality: Right;  Marland Kitchen [place] to 2010   includes laser and phlebectomies    There were no vitals filed for this visit.      Subjective Assessment - 06/06/16 0911    Subjective Reports that Dr. Veverly Fells said to continue PT for 2 more weeks including this week and then to join the gym program. Reports that her back is her problem today but pain not as bad as it was yesterday.   Pertinent History HTN, venous insuffieciency, back problems, disc problems, L TKR 2011   Patient Stated Goals walk and stand with good balance/comfort; regain ROM   Currently in Pain? Yes   Pain Score 5    Pain Location Back  Pain Orientation Lower   Pain Descriptors / Indicators Sore;Aching   Pain Onset More than a month ago            [facility] PT Assessment - 06/06/16 0001      Assessment   Medical Diagnosis S/P R TKR   Onset Date/Surgical Date 02/13/16     Precautions   Precautions Knee                     [facility] Adult PT Treatment/Exercise - 06/06/16 0001      Knee/Hip Exercises: Aerobic   Nustep Level 6 x 15 minutes.     Knee/Hip Exercises: Machines for Strengthening   Cybex Knee Extension 10# x22 reps   Cybex Knee Flexion 20# x25 reps     Knee/Hip Exercises: Standing   Hip Flexion Stengthening;Right;2 sets;10 reps;Knee bent   Hip Flexion Limitations 3#   Forward Lunges Right;2 sets;10 reps;3 seconds   Hip Abduction  Stengthening;Right;2 sets;10 reps;Knee straight   Abduction Limitations 3#   Hip Extension Stengthening;Right;2 sets;10 reps;Knee straight   Extension Limitations 3#   Rocker Board 2 minutes     Modalities   Modalities Electrical Stimulation;Vasopneumatic     Acupuncturist Location R knee   Electrical Stimulation Action IFC   Electrical Stimulation Parameters 1-10 hz x15 min   Electrical Stimulation Goals Edema;Pain     Vasopneumatic   Number Minutes Vasopneumatic  15 minutes   Vasopnuematic Location  Knee   Vasopneumatic Pressure Low   Vasopneumatic Temperature  42     Manual Therapy   Manual Therapy Soft tissue mobilization   Soft tissue mobilization R patellar mobilizations to promote proper mobility and incision mobilizations to promote proper mobility in sitting with knee resting in extension                  PT Short Term Goals - 05/02/16 1209      PT SHORT TERM GOAL #1   Title Decreased edema in R knee to within 1.5 cm of L   Status Achieved           PT Long Term Goals - 05/28/16 1144      PT LONG TERM GOAL #1   Title I with HEP   Status Achieved     PT LONG TERM GOAL #2   Title Improved R knee ROM -5 to 120 degrees to improve function   Status On-going     PT LONG TERM GOAL #3   Title Patient able to amb 300 feet safely without AD   Baseline continues to use [facility] for community ambulation   Status On-going     PT LONG TERM GOAL #4   Title Able to climb stairs with reciprocal gait and 5/5 knee strength   Baseline limited mainly by L knee rather than R knee   Status Achieved               Plan - 06/06/16 0951    Clinical Impression Statement Patient tolerated today's treatment fairly well as she is limited secondary to knee discomfort and low back pain today. Patient also interested in learning gym equiptment for gym program. Tolerated today's treatment well although she reported LLE fatigue with LLE  SLS with RLE exercises. Fairly good patellar mobiltiy and good incision mobility noted with manual therapy today. Normal modalities response noted following removal of the modalities..   Rehab Potential Excellent   PT Frequency 3x / week  PT Duration 6 weeks   PT Treatment/Interventions ADLs/Self Care Home Management;Electrical Stimulation;Cryotherapy;Therapeutic exercise;Manual techniques;Vasopneumatic Device;Passive range of motion;Patient/family education;Gait training;Stair training;Balance training;Neuromuscular re-education;Scar mobilization   PT Next Visit Plan Continue per TKR protocol with modalities PRN for pain for 2 more weeeks per MD orders.   Consulted and Agree with Plan of Care Patient      Patient will benefit from skilled therapeutic intervention in order to improve the following deficits and impairments:  Abnormal gait, Decreased range of motion, Pain, Decreased scar mobility, Decreased strength, Increased edema, Decreased mobility  Visit Diagnosis: Pain in right knee  Stiffness of right knee, not elsewhere classified  Generalized edema     Problem List Patient Active Problem List   Diagnosis Date Noted  . History of arthroplasty of right knee 03/05/2016  . OA (osteoarthritis) of knee 02/13/2016  . Leg edema 12/07/2015  . Preop cardiovascular exam 12/07/2015  . B12 deficiency 12/08/2014  . Metabolic syndrome [id number]  . Bradycardia 05/31/2014  . Hyperlipemia 04/28/2013  . Vitamin D deficiency 04/28/2013  . Lumbar spondylosis with myelopathy 04/28/2013  . Osteoarthritis 04/28/2013  . Hypertension 04/28/2013  . Incontinence of urine 04/28/2013  . Hypothyroidism 04/28/2013  . Cough 08/21/2011  . Abnormal CXR 08/21/2011    Wynelle Fanny, PTA 06/06/2016, 10:06 AM  [facility] [address] [place], [place], [address] Phone: [phone]   Fax:  [phone]  Name: Miranda Wilson MRN: [id number] Date of  Birth: 1951/07/27

## 2016-06-07 ENCOUNTER — Ambulatory Visit: Payer: BLUE CROSS/BLUE SHIELD | Admitting: Physical Therapy

## 2016-06-07 ENCOUNTER — Encounter: Payer: Self-pay | Admitting: *Deleted

## 2016-06-07 DIAGNOSIS — M25561 Pain in right knee: Secondary | ICD-10-CM

## 2016-06-07 DIAGNOSIS — M25661 Stiffness of right knee, not elsewhere classified: Secondary | ICD-10-CM

## 2016-06-07 DIAGNOSIS — R601 Generalized edema: Secondary | ICD-10-CM

## 2016-06-07 NOTE — Therapy (Signed)
[facility] [place], [place], [address] Phone: [phone]   Fax:  [phone]  Physical Therapy Treatment  Patient Details  Name: Miranda Wilson MRN: [id number] Date of Birth: [birth year] Referring Provider: Gorden Harms, MD  Encounter Date: 06/07/2016      PT End of Session - 06/07/16 1154    Visit Number 35   Date for PT Re-Evaluation 06/28/16   PT Start Time 1030   PT Stop Time 1130   PT Time Calculation (min) 60 min   Activity Tolerance Patient tolerated treatment well   Behavior During Therapy [facility] for tasks assessed/performed      Past Medical History:  Diagnosis Date  . At risk for sleep apnea   . Bilateral edema of lower extremity    left > right --  wear compression hose  . Cervical spondylosis   . Chronic constipation   . GERD (gastroesophageal reflux disease)   . History of colon polyps   . History of idiopathic seizure    AGE 65 to 65  X5  --  UNKNOW IDIOLOGY , PER PT WAS ANEMIC AT THE TIME--  NONE SINCE  . History of tachycardia    S/P  RADIOACTIVE IODINE ABLATION OF THYROID  . Hyperlipidemia   . Hypertension   . Hypothyroidism, postradioiodine therapy    AGE 65  . IBS (irritable bowel syndrome)   . OA (osteoarthritis)    shoulders  left > right  . PMB (postmenopausal bleeding)   . S/P radioactive iodine thyroid ablation   . Urge urinary incontinence   . Varicose veins   . Vein disorder    LEFT ANKLE VEIN VALVE REFLUX WITH DECREASED CIRCULATION     Past Surgical History:  Procedure Laterality Date  . CATARACT EXTRACTION W/ INTRAOCULAR LENS  IMPLANT, BILATERAL  right 2011//  left 2013  . CESAREAN SECTION  1979  . CLOSED LEFT KNEE MANIPULATION AND RIGHT KNEE CORTISONE INJECTION  02-20-2010  . COLONOSCOPY W/ POLYPECTOMY  10-25-2008  . DILATION AND CURETTAGE OF UTERUS    . ENDOVENOUS ABLATION SAPHENOUS VEIN W/ LASER  04/2007  . EYE SURGERY    . HYSTEROSCOPY W/D&C N/A 11/11/2014    Procedure: DILATATION AND CURETTAGE /HYSTEROSCOPY;  Surgeon: Gus Height, MD;  Location: [facility];  Service: Gynecology;  Laterality: N/A;  . JOINT REPLACEMENT    . LAPAROSCOPIC CHOLECYSTECTOMY  1993   and BILATERAL TUBAL LIGATION  . LEFT KNEE ARTHROTOMY W/ LYSIS ADHESIONS  12-01-2010  . TONSILLECTOMY  1955  . TOTAL KNEE ARTHROPLASTY Left 12-19-2009  . TOTAL KNEE ARTHROPLASTY Right 02/13/2016   Procedure: RIGHT TOTAL KNEE ARTHROPLASTY;  Surgeon: Gaynelle Arabian, MD;  Location: [facility];  Service: Orthopedics;  Laterality: Right;  Marland Kitchen [place] to 2010   includes laser and phlebectomies    There were no vitals filed for this visit.      Subjective Assessment - 06/07/16 1035    Subjective knee is feeling fine; back is "killing me."   Pertinent History HTN, venous insuffieciency, back problems, disc problems, L TKR 2011   Patient Stated Goals walk and stand with good balance/comfort; regain ROM   Currently in Pain? Yes   Pain Score 5    Pain Location Back   Pain Orientation Upper;Lower   Pain Descriptors / Indicators Aching;Sore   Pain Type Chronic pain   Pain Onset More than a month ago   Pain Frequency Intermittent  [facility] PT Assessment - 06/07/16 0001      Assessment   Medical Diagnosis S/P R TKR   Onset Date/Surgical Date 02/13/16     Precautions   Precautions Knee     AROM   Right Knee Extension -11   Right Knee Flexion 90     PROM   Right Knee Extension -6   Right Knee Flexion 98                     [facility] Adult PT Treatment/Exercise - 06/07/16 1037      Knee/Hip Exercises: Aerobic   Nustep Level 6 x 15 minutes.     Knee/Hip Exercises: Machines for Strengthening   Cybex Knee Extension 10# 3x10   Cybex Knee Flexion 20# 3x10   Cybex Leg Press 40# 2x10     Knee/Hip Exercises: Standing   Heel Raises Both;20 reps   Rocker Board 3 minutes     Modalities   Modalities Electrical Stimulation;Vasopneumatic      Acupuncturist Location R knee   Electrical Stimulation Action IFC   Electrical Stimulation Parameters to tolerance x 15 min   Electrical Stimulation Goals Edema;Pain     Vasopneumatic   Number Minutes Vasopneumatic  15 minutes   Vasopnuematic Location  Knee   Vasopneumatic Pressure Medium   Vasopneumatic Temperature  42                  PT Short Term Goals - 05/02/16 1209      PT SHORT TERM GOAL #1   Title Decreased edema in R knee to within 1.5 cm of L   Status Achieved           PT Long Term Goals - 06/07/16 1155      PT LONG TERM GOAL #1   Title I with HEP   Status Achieved     PT LONG TERM GOAL #2   Title Improved R knee ROM -5 to 120 degrees to improve function (06/28/16)   Time 3   Period Weeks   Status On-going     PT LONG TERM GOAL #3   Title Patient able to amb 300 feet safely without AD (06/28/16)   Baseline continues to use [facility] for community ambulation   Time 3   Period Weeks   Status On-going     PT LONG TERM GOAL #4   Title Able to climb stairs with reciprocal gait and 5/5 knee strength   Status Achieved               Plan - 06/07/16 1155    Clinical Impression Statement Pt tolerated exercises well today and progressing well with PT.  Plan to continue OPPT x 2 more weeks in order to transition to gym program.  Strength and gait continue to be limited at this time.  Will continue to benefit from PT to address deficits listed above.   Rehab Potential Excellent   PT Frequency 3x / week   PT Duration 3 weeks   PT Treatment/Interventions ADLs/Self Care Home Management;Electrical Stimulation;Cryotherapy;Therapeutic exercise;Manual techniques;Vasopneumatic Device;Passive range of motion;Patient/family education;Gait training;Stair training;Balance training;Neuromuscular re-education;Scar mobilization   PT Next Visit Plan Continue per TKR protocol with modalities PRN for pain for 2 more weeeks per MD  orders.   Consulted and Agree with Plan of Care Patient      Patient will benefit from skilled therapeutic intervention in order to improve the following deficits and impairments:  Abnormal  gait, Decreased range of motion, Pain, Decreased scar mobility, Decreased strength, Increased edema, Decreased mobility  Visit Diagnosis: Pain in right knee - Plan: PT plan of care cert/re-cert  Stiffness of right knee, not elsewhere classified - Plan: PT plan of care cert/re-cert  Generalized edema - Plan: PT plan of care cert/re-cert     Problem List Patient Active Problem List   Diagnosis Date Noted  . History of arthroplasty of right knee 03/05/2016  . OA (osteoarthritis) of knee 02/13/2016  . Leg edema 12/07/2015  . Preop cardiovascular exam 12/07/2015  . B12 deficiency 12/08/2014  . Metabolic syndrome [id number]  . Bradycardia 05/31/2014  . Hyperlipemia 04/28/2013  . Vitamin D deficiency 04/28/2013  . Lumbar spondylosis with myelopathy 04/28/2013  . Osteoarthritis 04/28/2013  . Hypertension 04/28/2013  . Incontinence of urine 04/28/2013  . Hypothyroidism 04/28/2013  . Cough 08/21/2011  . Abnormal CXR 08/21/2011     Laureen Abrahams, PT, DPT 06/07/16 12:05 PM   [facility] [address] [place], [place], [address] Phone: [phone]   Fax:  [phone]  Name: HUSNA BRASILE MRN: [id number] Date of Birth: 10-07-1951

## 2016-06-11 ENCOUNTER — Ambulatory Visit: Payer: BLUE CROSS/BLUE SHIELD

## 2016-06-12 ENCOUNTER — Ambulatory Visit (INDEPENDENT_AMBULATORY_CARE_PROVIDER_SITE_OTHER): Payer: BLUE CROSS/BLUE SHIELD | Admitting: *Deleted

## 2016-06-12 ENCOUNTER — Other Ambulatory Visit: Payer: BLUE CROSS/BLUE SHIELD

## 2016-06-12 DIAGNOSIS — E538 Deficiency of other specified B group vitamins: Secondary | ICD-10-CM | POA: Diagnosis not present

## 2016-06-12 DIAGNOSIS — E039 Hypothyroidism, unspecified: Secondary | ICD-10-CM

## 2016-06-12 NOTE — Progress Notes (Signed)
Vitamin b12 injection given and patient tolerated well.  

## 2016-06-12 NOTE — Patient Instructions (Signed)

## 2016-06-13 ENCOUNTER — Encounter: Payer: Self-pay | Admitting: Physical Therapy

## 2016-06-13 ENCOUNTER — Telehealth: Payer: Self-pay | Admitting: Family Medicine

## 2016-06-13 ENCOUNTER — Ambulatory Visit: Payer: BLUE CROSS/BLUE SHIELD | Admitting: Physical Therapy

## 2016-06-13 DIAGNOSIS — M25661 Stiffness of right knee, not elsewhere classified: Secondary | ICD-10-CM

## 2016-06-13 DIAGNOSIS — R601 Generalized edema: Secondary | ICD-10-CM

## 2016-06-13 DIAGNOSIS — M25561 Pain in right knee: Secondary | ICD-10-CM

## 2016-06-13 LAB — THYROID PANEL WITH TSH
Free Thyroxine Index: 2.6 (ref 1.2–4.9)
T3 Uptake Ratio: 25 % (ref 24–39)
T4, Total: 10.3 ug/dL (ref 4.5–12.0)
TSH: 0.134 u[IU]/mL — AB (ref 0.450–4.500)

## 2016-06-13 NOTE — Therapy (Signed)
[facility] [place], [place], [address] Phone: [phone]   Fax:  [phone]  Physical Therapy Treatment  Patient Details  Name: Miranda Wilson MRN: [id number] Date of Birth: Jul 28, 1951 Referring Provider: Gorden Harms, MD  Encounter Date: 06/13/2016      PT End of Session - 06/13/16 1035    Visit Number 36   Number of Visits 35   Date for PT Re-Evaluation 06/28/16   PT Start Time 1031   PT Stop Time 1128   PT Time Calculation (min) 57 min   Activity Tolerance Patient tolerated treatment well   Behavior During Therapy [facility] for tasks assessed/performed      Past Medical History:  Diagnosis Date  . At risk for sleep apnea   . Bilateral edema of lower extremity    left > right --  wear compression hose  . Cervical spondylosis   . Chronic constipation   . GERD (gastroesophageal reflux disease)   . History of colon polyps   . History of idiopathic seizure    AGE 65 to 20  X5  --  UNKNOW IDIOLOGY , PER PT WAS ANEMIC AT THE TIME--  NONE SINCE  . History of tachycardia    S/P  RADIOACTIVE IODINE ABLATION OF THYROID  . Hyperlipidemia   . Hypertension   . Hypothyroidism, postradioiodine therapy    AGE 1  . IBS (irritable bowel syndrome)   . OA (osteoarthritis)    shoulders  left > right  . PMB (postmenopausal bleeding)   . S/P radioactive iodine thyroid ablation   . Urge urinary incontinence   . Varicose veins   . Vein disorder    LEFT ANKLE VEIN VALVE REFLUX WITH DECREASED CIRCULATION     Past Surgical History:  Procedure Laterality Date  . CATARACT EXTRACTION W/ INTRAOCULAR LENS  IMPLANT, BILATERAL  right 2011//  left 2013  . CESAREAN SECTION  1979  . CLOSED LEFT KNEE MANIPULATION AND RIGHT KNEE CORTISONE INJECTION  02-20-2010  . COLONOSCOPY W/ POLYPECTOMY  10-25-2008  . DILATION AND CURETTAGE OF UTERUS    . ENDOVENOUS ABLATION SAPHENOUS VEIN W/ LASER  04/2007  . EYE SURGERY    . HYSTEROSCOPY W/D&C  N/A 11/11/2014   Procedure: DILATATION AND CURETTAGE /HYSTEROSCOPY;  Surgeon: Gus Height, MD;  Location: [facility];  Service: Gynecology;  Laterality: N/A;  . JOINT REPLACEMENT    . LAPAROSCOPIC CHOLECYSTECTOMY  1993   and BILATERAL TUBAL LIGATION  . LEFT KNEE ARTHROTOMY W/ LYSIS ADHESIONS  12-01-2010  . TONSILLECTOMY  1955  . TOTAL KNEE ARTHROPLASTY Left 12-19-2009  . TOTAL KNEE ARTHROPLASTY Right 02/13/2016   Procedure: RIGHT TOTAL KNEE ARTHROPLASTY;  Surgeon: Gaynelle Arabian, MD;  Location: [facility];  Service: Orthopedics;  Laterality: Right;  Marland Kitchen [place] to 2010   includes laser and phlebectomies    There were no vitals filed for this visit.      Subjective Assessment - 06/13/16 1034    Subjective Reports that she changed medications again and that may be the cause of her knee soreness.   Pertinent History HTN, venous insuffieciency, back problems, disc problems, L TKR 2011   Patient Stated Goals walk and stand with good balance/comfort; regain ROM   Currently in Pain? Yes   Pain Score 3    Pain Location Knee   Pain Orientation Right   Pain Descriptors / Indicators Sore   Pain Type Chronic pain   Pain Onset More than  a month ago                         [facility] Adult PT Treatment/Exercise - 06/13/16 0001      Knee/Hip Exercises: Aerobic   Nustep Level 6 x 15 minutes.     Knee/Hip Exercises: Machines for Strengthening   Cybex Knee Extension 10# 3x10   Cybex Knee Flexion 20# 3x10   Cybex Leg Press Seat 8, 2 plates, 2x10 reps     Knee/Hip Exercises: Standing   Forward Lunges Right;2 sets;10 reps;3 seconds   Rocker Board 3 minutes     Modalities   Modalities Electrical Stimulation;Vasopneumatic     Acupuncturist Location R knee   Electrical Stimulation Action IFC   Electrical Stimulation Parameters 1-10 hz x15 min   Electrical Stimulation Goals Edema;Pain     Vasopneumatic   Number  Minutes Vasopneumatic  15 minutes   Vasopnuematic Location  Knee   Vasopneumatic Pressure Medium   Vasopneumatic Temperature  42     Manual Therapy   Manual Therapy Soft tissue mobilization   Soft tissue mobilization R patellar mobilizations to promote proper mobility in long sitting                  PT Short Term Goals - 05/02/16 1209      PT SHORT TERM GOAL #1   Title Decreased edema in R knee to within 1.5 cm of L   Status Achieved           PT Long Term Goals - 06/07/16 1155      PT LONG TERM GOAL #1   Title I with HEP   Status Achieved     PT LONG TERM GOAL #2   Title Improved R knee ROM -5 to 120 degrees to improve function (06/28/16)   Time 3   Period Weeks   Status On-going     PT LONG TERM GOAL #3   Title Patient able to amb 300 feet safely without AD (06/28/16)   Baseline continues to use [facility] for community ambulation   Time 3   Period Weeks   Status On-going     PT LONG TERM GOAL #4   Title Able to climb stairs with reciprocal gait and 5/5 knee strength   Status Achieved               Plan - 06/13/16 1126    Clinical Impression Statement Patient tolerated today's treatment well with no reports of increased L knee discomfort during exercises. Patient able to complete machine strengthening fairly well although limited by LLE and required extra time. Patellar mobility noted as fairly good although sup/inf direction slightly more limited than lat/med direction. Normal modalities repsonse noted following removal of the modalities. All goals have been achieved except for ROM limitations, use of AD for ambulation.    Rehab Potential Excellent   PT Frequency 3x / week   PT Duration 3 weeks   PT Treatment/Interventions ADLs/Self Care Home Management;Electrical Stimulation;Cryotherapy;Therapeutic exercise;Manual techniques;Vasopneumatic Device;Passive range of motion;Patient/family education;Gait training;Stair training;Balance  training;Neuromuscular re-education;Scar mobilization   PT Next Visit Plan Continue per TKR protocol with modalities PRN for pain for 2 more weeeks per MD orders.   Consulted and Agree with Plan of Care Patient      Patient will benefit from skilled therapeutic intervention in order to improve the following deficits and impairments:  Abnormal gait, Decreased range of motion, Pain, Decreased  scar mobility, Decreased strength, Increased edema, Decreased mobility  Visit Diagnosis: Pain in right knee  Stiffness of right knee, not elsewhere classified  Generalized edema     Problem List Patient Active Problem List   Diagnosis Date Noted  . History of arthroplasty of right knee 03/05/2016  . OA (osteoarthritis) of knee 02/13/2016  . Leg edema 12/07/2015  . Preop cardiovascular exam 12/07/2015  . B12 deficiency 12/08/2014  . Metabolic syndrome [id number]  . Bradycardia 05/31/2014  . Hyperlipemia 04/28/2013  . Vitamin D deficiency 04/28/2013  . Lumbar spondylosis with myelopathy 04/28/2013  . Osteoarthritis 04/28/2013  . Hypertension 04/28/2013  . Incontinence of urine 04/28/2013  . Hypothyroidism 04/28/2013  . Cough 08/21/2011  . Abnormal CXR 08/21/2011    Wynelle Fanny, PTA 06/13/2016, 12:16 PM  [facility] [address] [place], [place], [address] Phone: [phone]   Fax:  [phone]  Name: LINAE MURA MRN: [id number] Date of Birth: 1951/05/05

## 2016-06-15 ENCOUNTER — Encounter: Payer: Self-pay | Admitting: Physical Therapy

## 2016-06-15 ENCOUNTER — Ambulatory Visit: Payer: BLUE CROSS/BLUE SHIELD | Admitting: Physical Therapy

## 2016-06-15 DIAGNOSIS — M25561 Pain in right knee: Secondary | ICD-10-CM

## 2016-06-15 DIAGNOSIS — R601 Generalized edema: Secondary | ICD-10-CM

## 2016-06-15 DIAGNOSIS — M25661 Stiffness of right knee, not elsewhere classified: Secondary | ICD-10-CM

## 2016-06-15 NOTE — Telephone Encounter (Signed)
Patient aware of results.

## 2016-06-15 NOTE — Therapy (Signed)
[facility] [place], [place], [address] Phone: [phone]   Fax:  [phone]  Physical Therapy Treatment  Patient Details  Name: Miranda Wilson MRN: [id number] Date of Birth: Aug 12, 1951 Referring Provider: Gorden Harms, MD  Encounter Date: 06/15/2016      PT End of Session - 06/15/16 1039    Visit Number 37   Number of Visits 35   Date for PT Re-Evaluation 06/28/16   PT Start Time 1030   PT Stop Time 1127   PT Time Calculation (min) 57 min   Activity Tolerance Patient tolerated treatment well   Behavior During Therapy [facility] for tasks assessed/performed      Past Medical History:  Diagnosis Date  . At risk for sleep apnea   . Bilateral edema of lower extremity    left > right --  wear compression hose  . Cervical spondylosis   . Chronic constipation   . GERD (gastroesophageal reflux disease)   . History of colon polyps   . History of idiopathic seizure    AGE 44 to 20  X5  --  UNKNOW IDIOLOGY , PER PT WAS ANEMIC AT THE TIME--  NONE SINCE  . History of tachycardia    S/P  RADIOACTIVE IODINE ABLATION OF THYROID  . Hyperlipidemia   . Hypertension   . Hypothyroidism, postradioiodine therapy    AGE 30  . IBS (irritable bowel syndrome)   . OA (osteoarthritis)    shoulders  left > right  . PMB (postmenopausal bleeding)   . S/P radioactive iodine thyroid ablation   . Urge urinary incontinence   . Varicose veins   . Vein disorder    LEFT ANKLE VEIN VALVE REFLUX WITH DECREASED CIRCULATION     Past Surgical History:  Procedure Laterality Date  . CATARACT EXTRACTION W/ INTRAOCULAR LENS  IMPLANT, BILATERAL  right 2011//  left 2013  . CESAREAN SECTION  1979  . CLOSED LEFT KNEE MANIPULATION AND RIGHT KNEE CORTISONE INJECTION  02-20-2010  . COLONOSCOPY W/ POLYPECTOMY  10-25-2008  . DILATION AND CURETTAGE OF UTERUS    . ENDOVENOUS ABLATION SAPHENOUS VEIN W/ LASER  04/2007  . EYE SURGERY    . HYSTEROSCOPY W/D&C  N/A 11/11/2014   Procedure: DILATATION AND CURETTAGE /HYSTEROSCOPY;  Surgeon: Gus Height, MD;  Location: [facility];  Service: Gynecology;  Laterality: N/A;  . JOINT REPLACEMENT    . LAPAROSCOPIC CHOLECYSTECTOMY  1993   and BILATERAL TUBAL LIGATION  . LEFT KNEE ARTHROTOMY W/ LYSIS ADHESIONS  12-01-2010  . TONSILLECTOMY  1955  . TOTAL KNEE ARTHROPLASTY Left 12-19-2009  . TOTAL KNEE ARTHROPLASTY Right 02/13/2016   Procedure: RIGHT TOTAL KNEE ARTHROPLASTY;  Surgeon: Gaynelle Arabian, MD;  Location: [facility];  Service: Orthopedics;  Laterality: Right;  Marland Kitchen [place] to 2010   includes laser and phlebectomies    There were no vitals filed for this visit.      Subjective Assessment - 06/15/16 1038    Subjective Reports that while she was busy yesterday with husband and a health scare that her knee was still stiff.   Pertinent History HTN, venous insuffieciency, back problems, disc problems, L TKR 2011   Patient Stated Goals walk and stand with good balance/comfort; regain ROM   Currently in Pain? No/denies            [facility] PT Assessment - 06/15/16 0001      Assessment   Medical Diagnosis S/P R TKR  Onset Date/Surgical Date 02/13/16     Precautions   Precautions Knee     ROM / Strength   AROM / PROM / Strength AROM     AROM   Overall AROM  Deficits   AROM Assessment Site Knee   Right/Left Knee Right   Right Knee Extension -13   Right Knee Flexion 95                     [facility] Adult PT Treatment/Exercise - 06/15/16 0001      Knee/Hip Exercises: Aerobic   Nustep Level 6 x 15 minutes.     Knee/Hip Exercises: Machines for Strengthening   Cybex Knee Extension 10# 3x10   Cybex Knee Flexion 20# 3x10   Cybex Leg Press Seat 8, 2 plates, 2x10 reps     Knee/Hip Exercises: Standing   Forward Lunges Right;2 sets;10 reps;3 seconds     Modalities   Modalities Electrical Stimulation;Vasopneumatic     Electrical Stimulation   Electrical  Stimulation Location R knee   Electrical Stimulation Action IFC   Electrical Stimulation Parameters 1-10 hz x15 min   Electrical Stimulation Goals Edema;Pain     Vasopneumatic   Number Minutes Vasopneumatic  15 minutes   Vasopnuematic Location  Knee   Vasopneumatic Pressure Low   Vasopneumatic Temperature  64                  PT Short Term Goals - 05/02/16 1209      PT SHORT TERM GOAL #1   Title Decreased edema in R knee to within 1.5 cm of L   Status Achieved           PT Long Term Goals - 06/15/16 1113      PT LONG TERM GOAL #1   Title I with HEP   Status Achieved     PT LONG TERM GOAL #2   Title Improved R knee ROM -5 to 120 degrees to improve function (06/28/16)   Time 3   Period Weeks   Status Not Met  AROM R knee ROM 13-95 deg 06/15/2016     PT LONG TERM GOAL #3   Title Patient able to amb 300 feet safely without AD (06/28/16)   Baseline continues to use [facility] for community ambulation   Time 3   Period Weeks   Status Partially Met  Ambulates at home without AD but uses AD outside of the home due to stair diffiiculty 06/15/2016     PT LONG TERM GOAL #4   Title Able to climb stairs with reciprocal gait and 5/5 knee strength   Status Achieved               Plan - 06/15/16 1132    Clinical Impression Statement Patient progressed slowly through PT following R TKR but made gains towards R knee ROM. Patient has achieved all goals except for R knee ROM goal and partially achieved goal regarding AD use. Patient only uses AD outside the home in case she encounters stairs. Patient able to tolerate machine strengthening well although limited slightly by knee ROM. Patient interested in facility's self directed gym program. Normal modalities response noted following removal of the modalities.   Rehab Potential Excellent   PT Frequency 3x / week   PT Duration 3 weeks   PT Treatment/Interventions ADLs/Self Care Home Management;Electrical  Stimulation;Cryotherapy;Therapeutic exercise;Manual techniques;Vasopneumatic Device;Passive range of motion;Patient/family education;Gait training;Stair training;Balance training;Neuromuscular re-education;Scar mobilization   PT Next Visit Plan D/C secondary  to insurance visit limitation and MD discharge.   Consulted and Agree with Plan of Care Patient      Patient will benefit from skilled therapeutic intervention in order to improve the following deficits and impairments:  Abnormal gait, Decreased range of motion, Pain, Decreased scar mobility, Decreased strength, Increased edema, Decreased mobility  Visit Diagnosis: Pain in right knee  Stiffness of right knee, not elsewhere classified  Generalized edema     Problem List Patient Active Problem List   Diagnosis Date Noted  . History of arthroplasty of right knee 03/05/2016  . OA (osteoarthritis) of knee 02/13/2016  . Leg edema 12/07/2015  . Preop cardiovascular exam 12/07/2015  . B12 deficiency 12/08/2014  . Metabolic syndrome 03/75/4360  . Bradycardia 05/31/2014  . Hyperlipemia 04/28/2013  . Vitamin D deficiency 04/28/2013  . Lumbar spondylosis with myelopathy 04/28/2013  . Osteoarthritis 04/28/2013  . Hypertension 04/28/2013  . Incontinence of urine 04/28/2013  . Hypothyroidism 04/28/2013  . Cough 08/21/2011  . Abnormal CXR 08/21/2011    Wynelle Fanny, PTA 06/15/2016, 12:25 PM  [facility] [address] [place], [place], [address] Phone: [phone]   Fax:  [phone]  Name: Miranda Wilson MRN: [id number] Date of Birth: 08/09/1951   PHYSICAL THERAPY DISCHARGE SUMMARY  Visits from Start of Care: 37  Current functional level related to goals / functional outcomes: Please see above.   Remaining deficits: Continued lack of right knee ROM.   Education / Equipment: HEP. Plan: Patient agrees to discharge.  Patient goals were partially met. Patient is being  discharged due to being pleased with the current functional level.  ?????

## 2016-06-18 ENCOUNTER — Other Ambulatory Visit: Payer: Self-pay | Admitting: *Deleted

## 2016-06-18 DIAGNOSIS — R7989 Other specified abnormal findings of blood chemistry: Secondary | ICD-10-CM

## 2016-06-18 DIAGNOSIS — M25561 Pain in right knee: Secondary | ICD-10-CM | POA: Diagnosis not present

## 2016-07-03 IMAGING — MR MR CERVICAL SPINE W/O CM
4 of 5 series · 14 of 48 positions shown · non-contrast
Comparison: Cervical spine radiographs 06/15/2014.

CLINICAL DATA: Neck pain for months.  No known injury.

EXAM:
MRI CERVICAL SPINE WITHOUT CONTRAST
TECHNIQUE: Multiplanar, multisequence MR imaging of the cervical spine was
performed. No intravenous contrast was administered.

[Series 3: T2 · sagittal · 3.0mm · 0.38mm/px · 5 of 13 slices shown (1 of 2)]
[im 1/13]
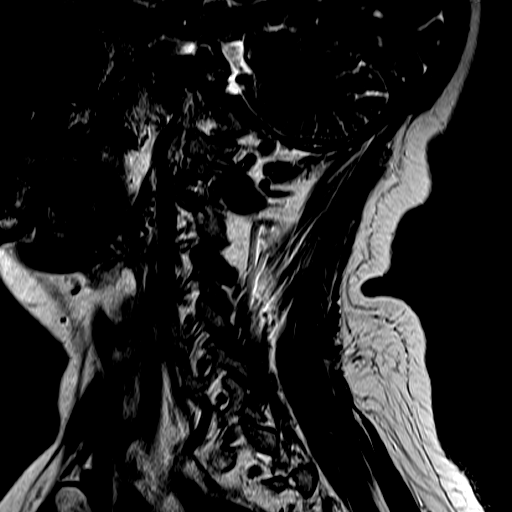
[im 3/13]
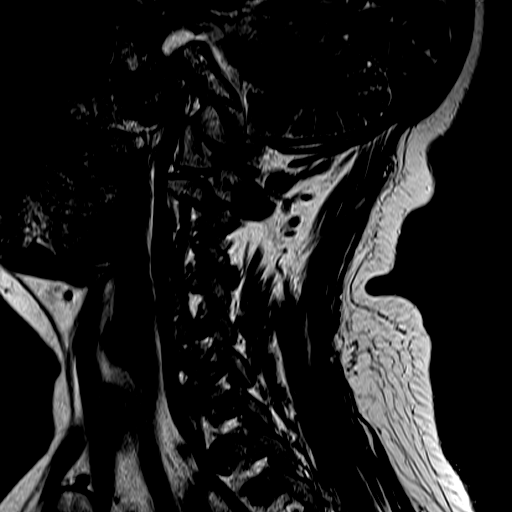
[im 5/13]
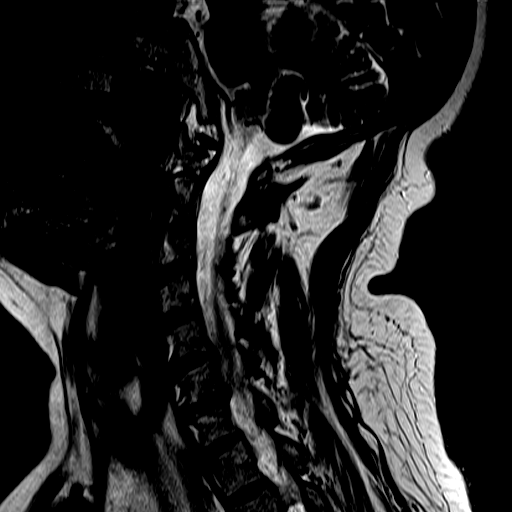
[im 8/13]
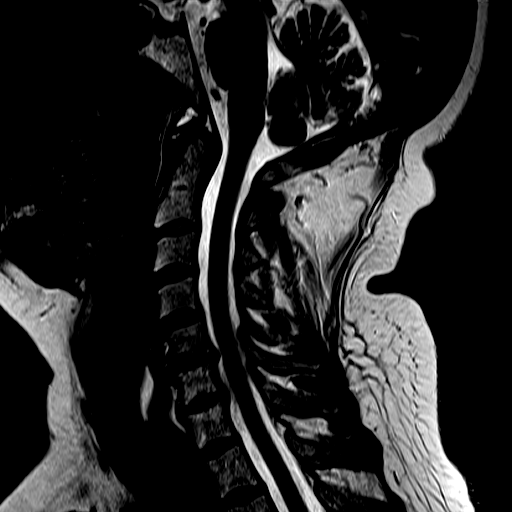
[im 13/13]
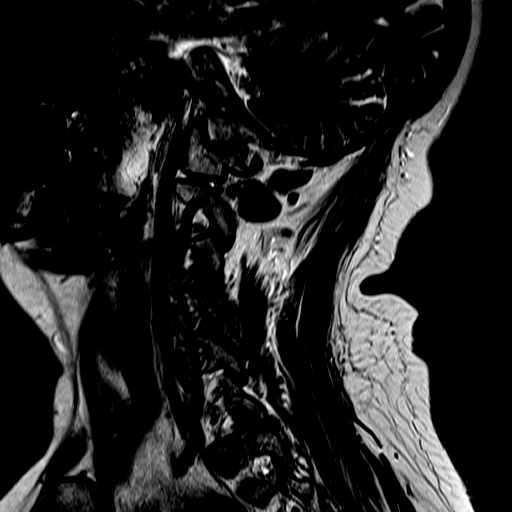

[Series 4: FLAIR · sagittal · 3.0mm · 0.44mm/px · 3 of 13 slices shown]
[im 1/13]
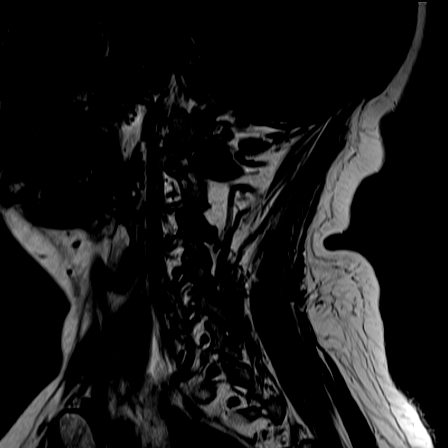
[im 7/13]
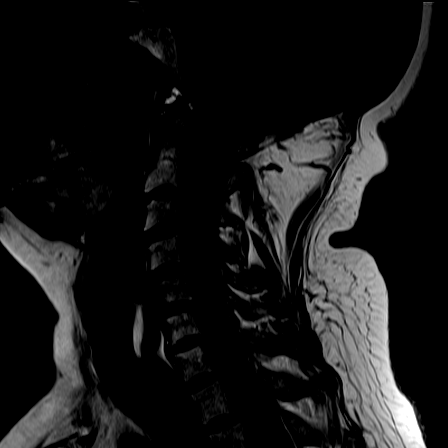
[im 13/13]
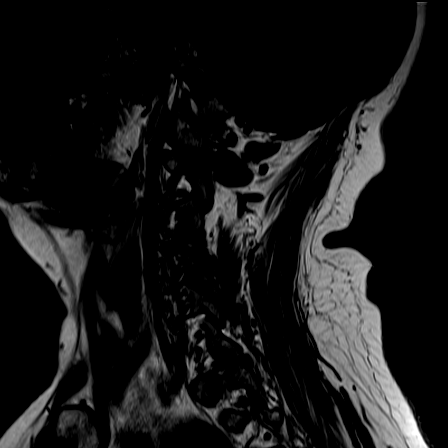

[Series 5: ir sagital · sagittal · 3.0mm · 0.23mm/px · 3 of 13 slices shown]
[im 1/13]
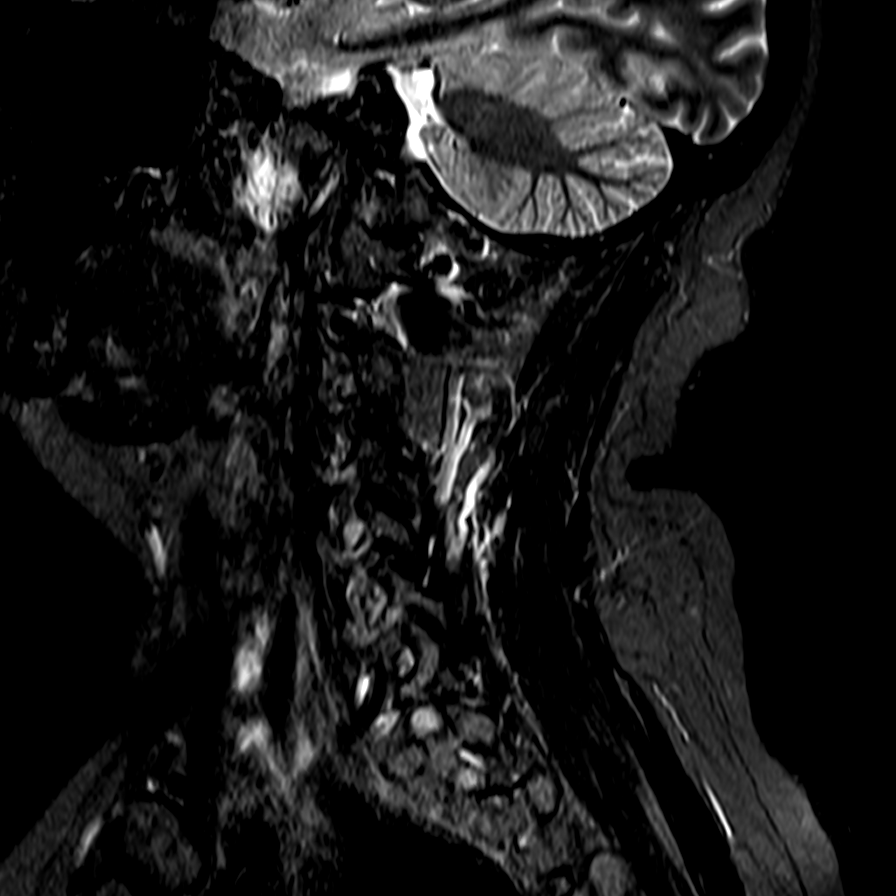
[im 7/13]
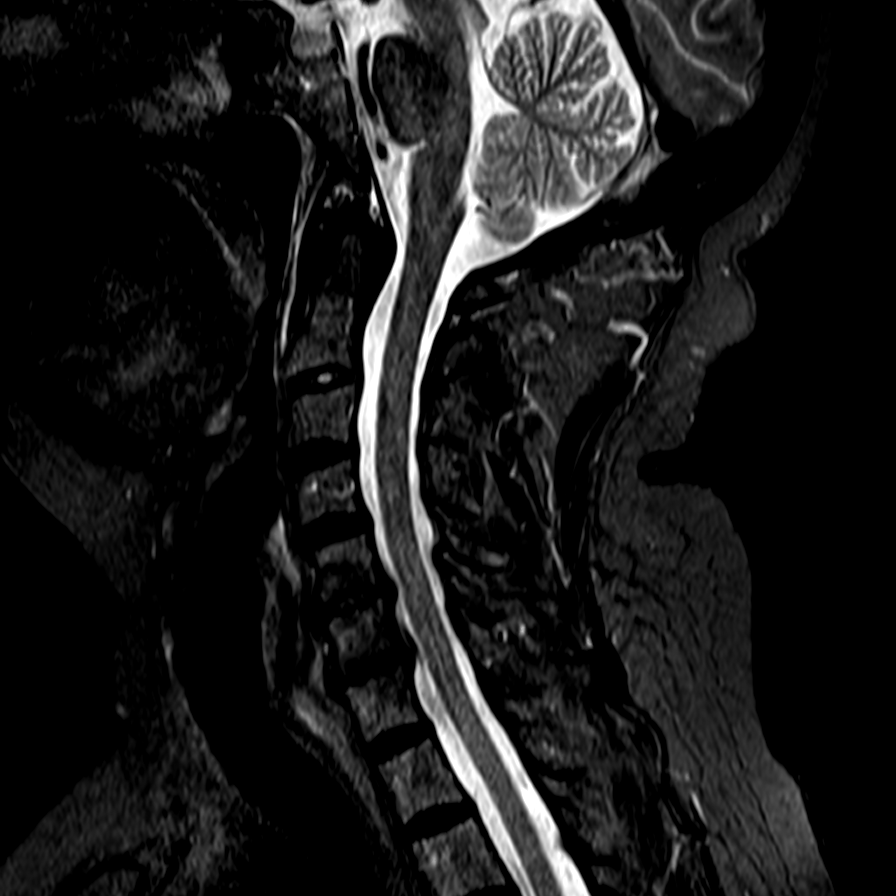
[im 13/13]
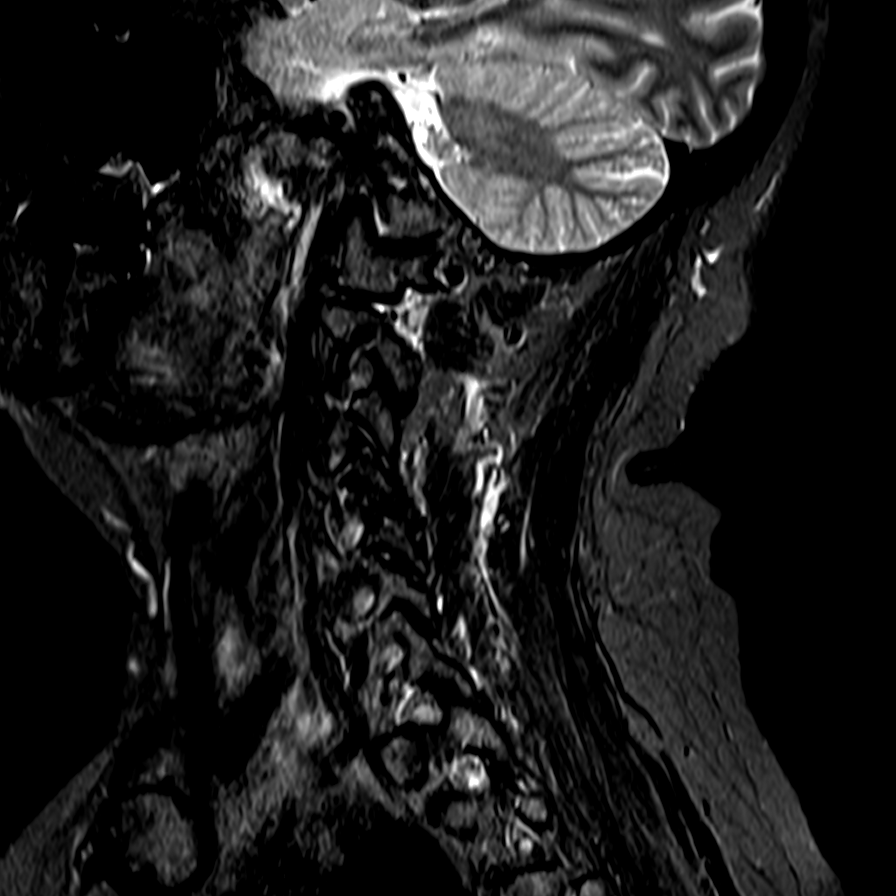

[Series 7: T2 · axial · 3.0mm · 0.25mm/px · z∈[-57,+31]mm · 3 of 38 slices shown (2 of 2)]
[im 5/38]
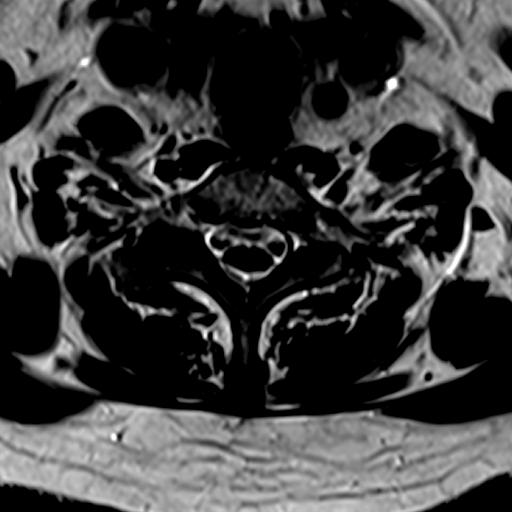
[im 20/38]
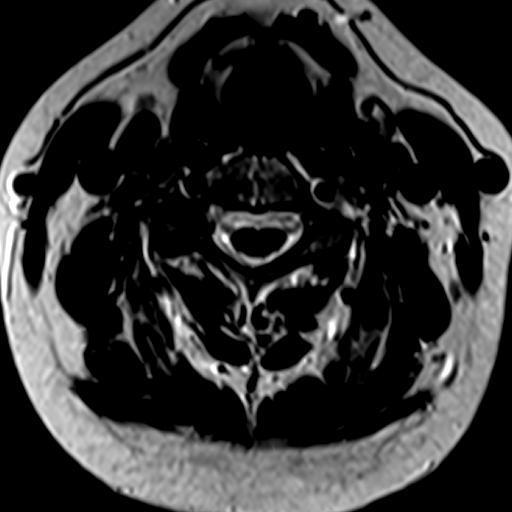
[im 33/38]
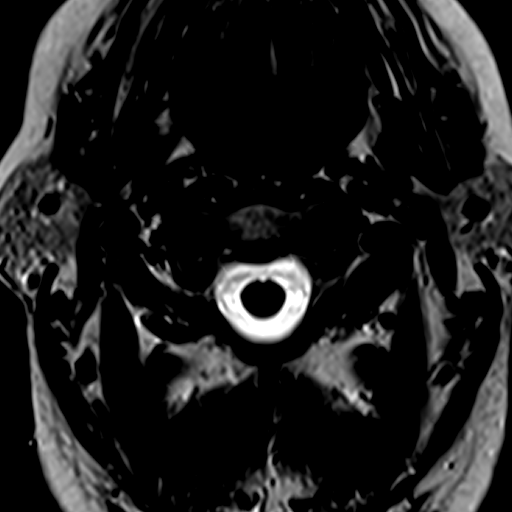

[14 of 48 positions shown; findings below may reference images not displayed]

FINDINGS: The cervical alignment is normal. There is no evidence of acute
cervical spine fracture or paraspinous ligamentous injury.

The craniocervical junction appears normal. The cervical cord is
normal in signal and caliber. There are bilateral vertebral artery
flow voids.

C2-3: The left facet joint appears fused. There is mildly increased
signal within the disc which may be secondary to ossification as
correlated with the radiographs. No spinal stenosis or nerve root
encroachment.

C3-4: Mild uncinate spurring and facet hypertrophy, worse on the
left. No cord deformity or significant foraminal compromise.

C4-5: Mild uncinate spurring and moderate asymmetric right-sided
facet hypertrophy. No cord deformity or significant foraminal
compromise.

C5-6: Spondylosis with loss of disc height and posterior osteophytes
covering diffusely bulging disc material. There is asymmetric
uncinate spurring and facet hypertrophy on the right contributing to
mild to moderate right and mild left foraminal stenosis. No cord
deformity.

C6-7: The spondylosis is more symmetric at this level with posterior
osteophytes and bilateral uncinate spurring. There is no cord
deformity. Only mild foraminal narrowing is present bilaterally.

C7-T1: The disc appears normal. There is mild asymmetric right-sided
facet hypertrophy. No significant spinal stenosis or nerve root
encroachment.
IMPRESSION: 1. Moderate spondylosis at C5-6 and C6-7 with loss of disc height
and posterior osteophytes. There is some resulting foraminal
stenosis, greatest on the right at C5-6. No cord deformity.
2. No other significant spinal stenosis or nerve root encroachment.
3. Multilevel facet disease. The left C2-3 facet joint appears
fused.

## 2016-07-06 ENCOUNTER — Ambulatory Visit: Payer: BLUE CROSS/BLUE SHIELD | Admitting: Family Medicine

## 2016-07-18 ENCOUNTER — Ambulatory Visit (INDEPENDENT_AMBULATORY_CARE_PROVIDER_SITE_OTHER): Payer: BLUE CROSS/BLUE SHIELD

## 2016-07-18 ENCOUNTER — Other Ambulatory Visit: Payer: Self-pay | Admitting: Family Medicine

## 2016-07-18 ENCOUNTER — Ambulatory Visit (INDEPENDENT_AMBULATORY_CARE_PROVIDER_SITE_OTHER): Payer: BLUE CROSS/BLUE SHIELD | Admitting: Family Medicine

## 2016-07-18 ENCOUNTER — Encounter: Payer: Self-pay | Admitting: Family Medicine

## 2016-07-18 VITALS — BP 136/74 | HR 53 | Temp 96.9°F | Ht 63.0 in | Wt 194.0 lb

## 2016-07-18 DIAGNOSIS — Z13820 Encounter for screening for osteoporosis: Secondary | ICD-10-CM

## 2016-07-18 DIAGNOSIS — I10 Essential (primary) hypertension: Secondary | ICD-10-CM

## 2016-07-18 DIAGNOSIS — Z23 Encounter for immunization: Secondary | ICD-10-CM

## 2016-07-18 DIAGNOSIS — E785 Hyperlipidemia, unspecified: Secondary | ICD-10-CM

## 2016-07-18 DIAGNOSIS — Z1211 Encounter for screening for malignant neoplasm of colon: Secondary | ICD-10-CM

## 2016-07-18 DIAGNOSIS — E8881 Metabolic syndrome: Secondary | ICD-10-CM | POA: Diagnosis not present

## 2016-07-18 DIAGNOSIS — E538 Deficiency of other specified B group vitamins: Secondary | ICD-10-CM | POA: Diagnosis not present

## 2016-07-18 DIAGNOSIS — E038 Other specified hypothyroidism: Secondary | ICD-10-CM | POA: Diagnosis not present

## 2016-07-18 DIAGNOSIS — Z780 Asymptomatic menopausal state: Secondary | ICD-10-CM

## 2016-07-18 DIAGNOSIS — E559 Vitamin D deficiency, unspecified: Secondary | ICD-10-CM

## 2016-07-18 MED ORDER — ICOSAPENT ETHYL 1 G PO CAPS: 1.0000 | ORAL_CAPSULE | Freq: Four times a day (QID) | ORAL | 3 refills | Status: DC

## 2016-07-18 NOTE — Progress Notes (Signed)
Subjective:    Patient ID: Miranda Wilson, female    DOB: 08-Jan-1951, 65 y.o.   MRN: [id number]  HPI Pt here for follow up and management of chronic medical problems which includes hypothyroid, hyperlipidemia and hypertension. She is taking medications regularly.The patient had right knee arthroplasty which was a revision of a previous knee arthroplasty in April of this year. The patient is doing great. She feels much better and is having much less pain with the right knee. She is very pleased with the results of her redo arthroplasty. She is getting physical therapy and physical exercise [place] on a regular basis and has lost weight as result of this. She is improving her ability to exercise and feeling better with doing this. She denies any chest pain or shortness of breath anymore than she would expect. She has had occasional quick [place]ch pains in her chest when she is sitting around but not any pain with exercising. She denies any trouble with swallowing heartburn indigestion nausea vomiting diarrhea or blood in the stool. She does have constipation and uses stool softeners and MiraLAX for this. She occasionally has some bright red blood when she has strained hard. She also has ongoing urinary frequency which she is had for years and has to wear a pad for control.    Patient Active Problem List   Diagnosis Date Noted  . History of arthroplasty of right knee 03/05/2016  . OA (osteoarthritis) of knee 02/13/2016  . Leg edema 12/07/2015  . Preop cardiovascular exam 12/07/2015  . B12 deficiency 12/08/2014  . Metabolic syndrome 35/00/9381  . Bradycardia 05/31/2014  . Hyperlipemia 04/28/2013  . Vitamin D deficiency 04/28/2013  . Lumbar spondylosis with myelopathy 04/28/2013  . Osteoarthritis 04/28/2013  . Hypertension 04/28/2013  . Incontinence of urine 04/28/2013  . Hypothyroidism 04/28/2013  . Cough 08/21/2011  . Abnormal CXR 08/21/2011   Outpatient Encounter Prescriptions as of  07/18/2016  Medication Sig  . atenolol (TENORMIN) 50 MG tablet TAKE 1 TABLET (50 MG TOTAL) BY MOUTH DAILY.  Marland Kitchen docusate sodium (COLACE) 100 MG capsule Take 100 mg by mouth daily.  Marland Kitchen levothyroxine (SYNTHROID, LEVOTHROID) 150 MCG tablet TAKE 1 TABLET (150 MCG TOTAL) BY MOUTH DAILY.  Marland Kitchen polyethylene glycol (MIRALAX / GLYCOLAX) packet Take 17 g by mouth daily as needed for mild constipation. Reported on 01/31/2016  . Propylene Glycol (SYSTANE BALANCE OP) Apply 1 drop to eye 2 (two) times daily as needed (Dry eyes). Reported on 01/31/2016  . rosuvastatin (CRESTOR) 40 MG tablet Take 20 mg by mouth every evening.  . triamcinolone cream (KENALOG) 0.1 % Apply 1 application topically 3 (three) times daily. To affected area of right leg.  . [DISCONTINUED] HYDROmorphone (DILAUDID) 2 MG tablet Take 1-2 tablets (2-4 mg total) by mouth every 4 (four) hours as needed for severe pain.  . [DISCONTINUED] methocarbamol (ROBAXIN) 500 MG tablet Take 1 tablet (500 mg total) by mouth every 6 (six) hours as needed for muscle spasms.   Facility-Administered Encounter Medications as of 07/18/2016  Medication  . cyanocobalamin ((VITAMIN B-12)) injection 1,000 mcg      Review of Systems  Constitutional: Negative.   HENT: Negative.   Eyes: Negative.   Respiratory: Negative.   Cardiovascular: Negative.   Gastrointestinal: Negative.   Endocrine: Negative.   Genitourinary: Negative.   Musculoskeletal: Negative.   Skin: Negative.   Allergic/Immunologic: Negative.   Neurological: Negative.   Hematological: Negative.   Psychiatric/Behavioral: Negative.        Objective:  Physical Exam  Constitutional: She is oriented to person, place, and time. She appears well-developed and well-nourished. No distress.  HENT:  Head: Normocephalic and atraumatic.  Right Ear: External ear normal.  Left Ear: External ear normal.  Nose: Nose normal.  Mouth/Throat: Oropharynx is clear and moist.  Eyes: Conjunctivae and EOM are normal.  Pupils are equal, round, and reactive to light. Right eye exhibits no discharge. Left eye exhibits no discharge. No scleral icterus.  Neck: Normal range of motion. Neck supple. No thyromegaly present.  No bruits thyromegaly or anterior cervical adenopathy  Cardiovascular: Normal rate, regular rhythm, normal heart sounds and intact distal pulses.   No murmur heard. Distal pulses were difficult to palpate but were palpable. She had strong support hose on and this interfered with good palpation. The heart has a regular rate and rhythm at 60/m  Pulmonary/Chest: Effort normal and breath sounds normal. No respiratory distress. She has no wheezes. She has no rales. She exhibits no tenderness.  Abdominal: Soft. Bowel sounds are normal. She exhibits no mass. There is no tenderness. There is no rebound and no guarding.  Musculoskeletal: Normal range of motion. She exhibits no edema.  She has good mobility and there was good healing of the recent right knee arthroplasty.  Lymphadenopathy:    She has no cervical adenopathy.  Neurological: She is alert and oriented to person, place, and time. She has normal reflexes. No cranial nerve deficit.  Skin: Skin is warm and dry. No rash noted.  Psychiatric: She has a normal mood and affect. Her behavior is normal. Judgment and thought content normal.  Nursing note and vitals reviewed.  BP 136/74 (BP Location: Left Arm)   Pulse (!) 53   Temp (!) 96.9 F (36.1 C) (Oral)   Ht '5\' 3"'  (1.6 m)   Wt 194 lb (88 kg)   LMP 04/29/1991   BMI 34.37 kg/m         Assessment & Plan:  1. B12 deficiency -We'll check a B12 level today and she will get a B12 injection today. She is about 5 weeks since the last B12 injection. - CBC with Differential/Platelet  2. Essential hypertension, benign -The blood pressure is good today and she will continue with current treatment - DG Chest 2 View; Future - BMP8+EGFR - CBC with Differential/Platelet - Hepatic function  panel  3. Vitamin D deficiency -Continue with current treatment pending results of lab work - CBC with Differential/Platelet - VITAMIN D 25 Hydroxy (Vit-D Deficiency, Fractures)  4. Other specified hypothyroidism -Continue with current thyroid treatment pending results of lab work - CBC with Differential/Platelet - Thyroid Panel With TSH  5. Hyperlipemia -Continue with aggressive therapeutic lifestyle changes and current treatment pending results of lab work - [employer] 2 View; Future - CBC with Differential/Platelet - Lipid panel  6. Metabolic syndrome -Increase exercise regimen and continue to watch diet - CBC with Differential/Platelet  7. Postmenopausal - CBC with Differential/Platelet  8. Screening for osteoporosis - CBC with Differential/Platelet  9. Encounter for immunization -Patient is also due to a Pneumovax and shingles shot and these will be given later this year or early next year. - Flu vaccine HIGH DOSE PF  Patient Instructions                       Medicare Annual Wellness Visit  [facility] and the medical providers at [facility] strive to bring you the best medical care.  In doing  so we not only want to address your current medical conditions and concerns but also to detect new conditions early and prevent illness, disease and health-related problems.    Medicare offers a yearly Wellness Visit which allows our clinical staff to assess your need for preventative services including immunizations, lifestyle education, counseling to decrease risk of preventable diseases and screening for fall risk and other medical concerns.    This visit is provided free of charge (no copay) for all Medicare recipients. The clinical pharmacists at [facility] have begun to conduct these Wellness Visits which will also include a thorough review of all your medications.    As you primary medical provider recommend that you make an  appointment for your Annual Wellness Visit if you have not done so already this year.  You may set up this appointment before you leave today or you may call back [phone]) and schedule an appointment.  Please make sure when you call that you mention that you are scheduling your Annual Wellness Visit with the clinical pharmacist so that the appointment may be made for the proper length of time.     Continue current medications. Continue good therapeutic lifestyle changes which include good diet and exercise. Fall precautions discussed with patient. If an FOBT was given today- please return it to our front desk. If you are over 46 years old - you may need Prevnar 57 or the adult Pneumonia vaccine.  **Flu shots are available--- please call and schedule a FLU-CLINIC appointment**  After your visit with [place] today you will receive a survey in the mail or online from [employer] regarding your care with [place]. Please take a moment to fill this out. Your feedback is very important to [place] as you can help [place] better understand your patient needs as well as improve your experience and satisfaction. WE CARE ABOUT YOU!!!   Continue exercise program B careful do not put yourself at risk for falling Continue to follow-up with orthopedist as needed We will discuss and get an appointment with the gastroenterologist pending on when you need to see him again and have a colonoscopy. If we do not call you within a couple weeks please get back in touch with [place] on this. Continue to drink plenty of fluids and stay well hydrated and use stool softeners as needed Continue exercise program to improve your stamina and exercise ability. Continue to work on weight loss We will give you your Pneumovax at your next visit our next spring and also give you a shingles shot at that time Take the fish oil that we prescribed 1 twice daily     Arrie Senate MD

## 2016-07-18 NOTE — Patient Instructions (Addendum)
Medicare Annual Wellness Visit  [facility] and the medical providers at [facility] strive to bring you the best medical care.  In doing so we not only want to address your current medical conditions and concerns but also to detect new conditions early and prevent illness, disease and health-related problems.    Medicare offers a yearly Wellness Visit which allows our clinical staff to assess your need for preventative services including immunizations, lifestyle education, counseling to decrease risk of preventable diseases and screening for fall risk and other medical concerns.    This visit is provided free of charge (no copay) for all Medicare recipients. The clinical pharmacists at [facility] have begun to conduct these Wellness Visits which will also include a thorough review of all your medications.    As you primary medical provider recommend that you make an appointment for your Annual Wellness Visit if you have not done so already this year.  You may set up this appointment before you leave today or you may call back [phone] and schedule an appointment.  Please make sure when you call that you mention that you are scheduling your Annual Wellness Visit with the clinical pharmacist so that the appointment may be made for the proper length of time.     Continue current medications. Continue good therapeutic lifestyle changes which include good diet and exercise. Fall precautions discussed with patient. If an FOBT was given today- please return it to our front desk. If you are over 47 years old - you may need Prevnar 14 or the adult Pneumonia vaccine.  **Flu shots are available--- please call and schedule a FLU-CLINIC appointment**  After your visit with [place] today you will receive a survey in the mail or online from [employer] regarding your care with [place]. Please take a moment to fill this out. Your feedback is very  important to [place] as you can help [place] better understand your patient needs as well as improve your experience and satisfaction. WE CARE ABOUT YOU!!!   Continue exercise program B careful do not put yourself at risk for falling Continue to follow-up with orthopedist as needed We will discuss and get an appointment with the gastroenterologist pending on when you need to see him again and have a colonoscopy. If we do not call you within a couple weeks please get back in touch with [place] on this. Continue to drink plenty of fluids and stay well hydrated and use stool softeners as needed Continue exercise program to improve your stamina and exercise ability. Continue to work on weight loss We will give you your Pneumovax at your next visit our next spring and also give you a shingles shot at that time Take the fish oil that we prescribed 1 twice daily

## 2016-07-19 ENCOUNTER — Encounter: Payer: Self-pay | Admitting: Internal Medicine

## 2016-07-19 LAB — VITAMIN D 25 HYDROXY (VIT D DEFICIENCY, FRACTURES): VIT D 25 HYDROXY: 38.4 ng/mL (ref 30.0–100.0)

## 2016-07-19 LAB — THYROID PANEL WITH TSH
FREE THYROXINE INDEX: 2 (ref 1.2–4.9)
T3 UPTAKE RATIO: 24 % (ref 24–39)
T4 TOTAL: 8.3 ug/dL (ref 4.5–12.0)
TSH: 0.477 u[IU]/mL (ref 0.450–4.500)

## 2016-07-19 LAB — CBC WITH DIFFERENTIAL/PLATELET
BASOS: 0 %
Basophils Absolute: 0 10*3/uL (ref 0.0–0.2)
EOS (ABSOLUTE): 0.1 10*3/uL (ref 0.0–0.4)
EOS: 3 %
HEMATOCRIT: 37.7 % (ref 34.0–46.6)
Hemoglobin: 12.4 g/dL (ref 11.1–15.9)
IMMATURE GRANS (ABS): 0 10*3/uL (ref 0.0–0.1)
IMMATURE GRANULOCYTES: 0 %
Lymphocytes Absolute: 1.3 10*3/uL (ref 0.7–3.1)
Lymphs: 30 %
MCH: 26.6 pg (ref 26.6–33.0)
MCHC: 32.9 g/dL (ref 31.5–35.7)
MCV: 81 fL (ref 79–97)
MONOS ABS: 0.5 10*3/uL (ref 0.1–0.9)
Monocytes: 12 %
NEUTROS ABS: 2.3 10*3/uL (ref 1.4–7.0)
NEUTROS PCT: 55 %
Platelets: 198 10*3/uL (ref 150–379)
RBC: 4.66 x10E6/uL (ref 3.77–5.28)
RDW: 16.7 % — AB (ref 12.3–15.4)
WBC: 4.3 10*3/uL (ref 3.4–10.8)

## 2016-07-19 LAB — HEPATIC FUNCTION PANEL
ALT: 14 IU/L (ref 0–32)
AST: 23 IU/L (ref 0–40)
Albumin: 4.7 g/dL (ref 3.6–4.8)
Alkaline Phosphatase: 118 IU/L — ABNORMAL HIGH (ref 39–117)
BILIRUBIN TOTAL: 0.5 mg/dL (ref 0.0–1.2)
BILIRUBIN, DIRECT: 0.14 mg/dL (ref 0.00–0.40)
Total Protein: 6.9 g/dL (ref 6.0–8.5)

## 2016-07-19 LAB — BMP8+EGFR
BUN/Creatinine Ratio: 15 (ref 12–28)
BUN: 15 mg/dL (ref 8–27)
CALCIUM: 9.5 mg/dL (ref 8.7–10.3)
CO2: 26 mmol/L (ref 18–29)
CREATININE: 1.03 mg/dL — AB (ref 0.57–1.00)
Chloride: 99 mmol/L (ref 96–106)
GFR, EST AFRICAN AMERICAN: 65 mL/min/{1.73_m2} (ref >59)
GFR, EST NON AFRICAN AMERICAN: 56 mL/min/{1.73_m2} — AB (ref >59)
GLUCOSE: 58 mg/dL — AB (ref 65–99)
Potassium: 3.9 mmol/L (ref 3.5–5.2)
SODIUM: 141 mmol/L (ref 134–144)

## 2016-07-19 LAB — LIPID PANEL
CHOLESTEROL TOTAL: 223 mg/dL — AB (ref 100–199)
Chol/HDL Ratio: 3.3 ratio units (ref 0.0–4.4)
HDL: 67 mg/dL (ref >39)
LDL Calculated: 138 mg/dL — ABNORMAL HIGH (ref 0–99)
Triglycerides: 90 mg/dL (ref 0–149)
VLDL Cholesterol Cal: 18 mg/dL (ref 5–40)

## 2016-07-19 NOTE — Addendum Note (Signed)
Addended by: Zannie Cove on: 07/19/2016 09:25 AM   Modules accepted: Orders

## 2016-07-21 ENCOUNTER — Other Ambulatory Visit: Payer: Self-pay | Admitting: Family Medicine

## 2016-07-25 DIAGNOSIS — Z1231 Encounter for screening mammogram for malignant neoplasm of breast: Secondary | ICD-10-CM | POA: Diagnosis not present

## 2016-07-25 DIAGNOSIS — Z01419 Encounter for gynecological examination (general) (routine) without abnormal findings: Secondary | ICD-10-CM | POA: Diagnosis not present

## 2016-08-20 ENCOUNTER — Ambulatory Visit (INDEPENDENT_AMBULATORY_CARE_PROVIDER_SITE_OTHER): Payer: Medicare Other | Admitting: *Deleted

## 2016-08-20 DIAGNOSIS — E538 Deficiency of other specified B group vitamins: Secondary | ICD-10-CM

## 2016-08-20 NOTE — Progress Notes (Signed)
Pt given Vit B12 1000mcg Tolerated well 

## 2016-09-14 ENCOUNTER — Telehealth: Payer: Self-pay | Admitting: Family Medicine

## 2016-09-14 NOTE — Telephone Encounter (Signed)
Patient informed that mammogram results were normal, she states she never received letter from [facility]

## 2016-09-24 ENCOUNTER — Ambulatory Visit (INDEPENDENT_AMBULATORY_CARE_PROVIDER_SITE_OTHER): Payer: Medicare Other | Admitting: *Deleted

## 2016-09-24 DIAGNOSIS — E538 Deficiency of other specified B group vitamins: Secondary | ICD-10-CM

## 2016-09-24 NOTE — Progress Notes (Signed)
Pt given vit B12 inj Tolerated well 

## 2016-10-01 ENCOUNTER — Ambulatory Visit (AMBULATORY_SURGERY_CENTER): Payer: Self-pay | Admitting: *Deleted

## 2016-10-01 VITALS — Ht 63.5 in | Wt 200.0 lb

## 2016-10-01 DIAGNOSIS — Z86010 Personal history of colonic polyps: Secondary | ICD-10-CM

## 2016-10-01 NOTE — Progress Notes (Signed)
Patient denies any allergies to eggs or soy. Patient denies any problems with anesthesia/sedation. Patient denies any oxygen use at home and does not take any diet/weight loss medications. EMMI education assisgned to patient on colonoscopy, this was explained and instructions given to patient. 

## 2016-10-11 ENCOUNTER — Encounter: Payer: Self-pay | Admitting: Internal Medicine

## 2016-10-11 ENCOUNTER — Ambulatory Visit: Payer: Medicare Other | Admitting: Internal Medicine

## 2016-10-11 VITALS — BP 135/64 | HR 58 | Temp 97.1°F | Resp 14 | Ht 63.5 in | Wt 200.0 lb

## 2016-10-11 DIAGNOSIS — I10 Essential (primary) hypertension: Secondary | ICD-10-CM | POA: Diagnosis not present

## 2016-10-11 DIAGNOSIS — Z1211 Encounter for screening for malignant neoplasm of colon: Secondary | ICD-10-CM | POA: Diagnosis not present

## 2016-10-11 DIAGNOSIS — Z86010 Personal history of colonic polyps: Secondary | ICD-10-CM

## 2016-10-11 MED ORDER — SODIUM CHLORIDE 0.9 % IV SOLN: 500.0000 mL | INTRAVENOUS | Status: DC

## 2016-10-11 NOTE — Patient Instructions (Addendum)
   No polyps or cancer today.  Next routine colonoscopy in 10 years - 2027-8.  I appreciate the opportunity to care for you. Gatha Mayer, MD, FACG  YOU HAD AN ENDOSCOPIC PROCEDURE TODAY AT [facility]:   Refer to the procedure report that was given to you for any specific questions about what was found during the examination.  If the procedure report does not answer your questions, please call your gastroenterologist to clarify.  If you requested that your care partner not be given the details of your procedure findings, then the procedure report has been included in a sealed envelope for you to review at your convenience later.  YOU SHOULD EXPECT: Some feelings of bloating in the abdomen. Passage of more gas than usual.  Walking can help get rid of the air that was put into your GI tract during the procedure and reduce the bloating. If you had a lower endoscopy (such as a colonoscopy or flexible sigmoidoscopy) you may notice spotting of blood in your stool or on the toilet paper. If you underwent a bowel prep for your procedure, you may not have a normal bowel movement for a few days.  Please Note:  You might notice some irritation and congestion in your nose or some drainage.  This is from the oxygen used during your procedure.  There is no need for concern and it should clear up in a day or so.  SYMPTOMS TO REPORT IMMEDIATELY:   Following lower endoscopy (colonoscopy or flexible sigmoidoscopy):  Excessive amounts of blood in the stool  Significant tenderness or worsening of abdominal pains  Swelling of the abdomen that is new, acute  Fever of 100F or higher   For urgent or emergent issues, a gastroenterologist can be reached at any hour by calling [phone].   DIET:  We do recommend a small meal at first, but then you may proceed to your regular diet.  Drink plenty of fluids but you should avoid alcoholic beverages for 24 hours.  ACTIVITY:  You should  plan to take it easy for the rest of today and you should NOT DRIVE or use heavy machinery until tomorrow (because of the sedation medicines used during the test).    FOLLOW UP: Our staff will call the number listed on your records the next business day following your procedure to check on you and address any questions or concerns that you may have regarding the information given to you following your procedure. If we do not reach you, we will leave a message.  However, if you are feeling well and you are not experiencing any problems, there is no need to return our call.  We will assume that you have returned to your regular daily activities without incident.  If any biopsies were taken you will be contacted by phone or by letter within the next 1-3 weeks.  Please call us at [phone] if you have not heard about the biopsies in 3 weeks.    SIGNATURES/CONFIDENTIALITY: You and/or your care partner have signed paperwork which will be entered into your electronic medical record.  These signatures attest to the fact that that the information above on your After Visit Summary has been reviewed and is understood.  Full responsibility of the confidentiality of this discharge information lies with you and/or your care-partner.  Read all handouts given to you by your recovery room nurse. Thank-you for choosing [place] for your healthcare needs today.

## 2016-10-11 NOTE — Op Note (Signed)
[name] Procedure Date: 10/11/2016 8:58 AM MRN: [id number] Endoscopist: Gatha Mayer , MD Age: 65 Referring MD:  Date of Birth: 01/06/1951 Gender: Female Account #: [id number] Procedure:                Colonoscopy Indications:              Surveillance: Personal history of adenomatous                            polyps on last colonoscopy > 5 years ago Medicines:                Propofol per Anesthesia, Monitored Anesthesia Care Procedure:                Pre-Anesthesia Assessment:                           - Prior to the procedure, a History and Physical                            was performed, and patient medications and                            allergies were reviewed. The patient's tolerance of                            previous anesthesia was also reviewed. The risks                            and benefits of the procedure and the sedation                            options and risks were discussed with the patient.                            All questions were answered, and informed consent                            was obtained. Prior Anticoagulants: The patient                            last took aspirin 5 days prior to the procedure.                            ASA Grade Assessment: III - A patient with severe                            systemic disease. After reviewing the risks and                            benefits, the patient was deemed in satisfactory                            condition to undergo the procedure.  After obtaining informed consent, the colonoscope                            was passed under direct vision. Throughout the                            procedure, the patient's blood pressure, pulse, and                            oxygen saturations were monitored continuously. The                            Model CF-HQ190L [id number]) scope was introduced   through the anus and advanced to the the cecum,                            identified by appendiceal orifice and ileocecal                            valve. The colonoscopy was performed without                            difficulty. The patient tolerated the procedure                            well. The quality of the bowel preparation was                            good. The bowel preparation used was Miralax. The                            ileocecal valve, appendiceal orifice, and rectum                            were photographed. Scope In: 9:09:08 AM Scope Out: 9:25:24 AM Scope Withdrawal Time: 0 hours 11 minutes 45 seconds  Total Procedure Duration: 0 hours 16 minutes 16 seconds  Findings:                 The perianal and digital rectal examinations were                            normal.                           The entire examined colon appeared normal on direct                            and retroflexion views. Complications:            No immediate complications. Estimated Blood Loss:     Estimated blood loss: none. Impression:               - The entire examined colon is normal on direct and  retroflexion views.                           - No specimens collected.                           - Personal history of colonic polyp - diminutive                            adenoma removed 2009. Recommendation:           - Patient has a contact number available for                            emergencies. The signs and symptoms of potential                            delayed complications were discussed with the                            patient. Return to normal activities tomorrow.                            Written discharge instructions were provided to the                            patient.                           - Resume previous diet.                           - Continue present medications.                           - Repeat colonoscopy in 10  years for                            screening/surveillance purposes. Gatha Mayer, MD 10/11/2016 9:31:10 AM This report has been signed electronically.

## 2016-10-11 NOTE — Progress Notes (Signed)
Patient awakening,vss,report to rn 

## 2016-10-12 ENCOUNTER — Telehealth: Payer: Self-pay | Admitting: *Deleted

## 2016-10-12 NOTE — Telephone Encounter (Signed)
  Follow up Call-  Call back number 10/11/2016  Post procedure Call Back phone  # [phone]  Permission to leave phone message Yes  Some recent data might be hidden     Patient questions:  Do you have a fever, pain , or abdominal swelling? No. Pain Score  0 *  Have you tolerated food without any problems? Yes.    Have you been able to return to your normal activities? Yes.    Do you have any questions about your discharge instructions: Diet   No. Medications  No. Follow up visit  No.  Do you have questions or concerns about your Care? No.  Actions: * If pain score is 4 or above: No action needed, pain <4.

## 2016-10-25 ENCOUNTER — Ambulatory Visit (INDEPENDENT_AMBULATORY_CARE_PROVIDER_SITE_OTHER): Payer: Medicare Other | Admitting: *Deleted

## 2016-10-25 DIAGNOSIS — E538 Deficiency of other specified B group vitamins: Secondary | ICD-10-CM | POA: Diagnosis not present

## 2016-10-25 NOTE — Progress Notes (Signed)
Pt given Vit B12 inj Tolerated well 

## 2016-11-23 ENCOUNTER — Encounter: Payer: Self-pay | Admitting: Family Medicine

## 2016-11-23 ENCOUNTER — Ambulatory Visit (INDEPENDENT_AMBULATORY_CARE_PROVIDER_SITE_OTHER): Payer: Medicare Other | Admitting: Family Medicine

## 2016-11-23 ENCOUNTER — Ambulatory Visit (INDEPENDENT_AMBULATORY_CARE_PROVIDER_SITE_OTHER): Payer: Medicare Other

## 2016-11-23 VITALS — BP 126/70 | HR 47 | Temp 96.6°F | Ht 63.5 in | Wt 202.0 lb

## 2016-11-23 DIAGNOSIS — R208 Other disturbances of skin sensation: Secondary | ICD-10-CM | POA: Diagnosis not present

## 2016-11-23 DIAGNOSIS — M5136 Other intervertebral disc degeneration, lumbar region: Secondary | ICD-10-CM | POA: Diagnosis not present

## 2016-11-23 DIAGNOSIS — E8881 Metabolic syndrome: Secondary | ICD-10-CM | POA: Diagnosis not present

## 2016-11-23 DIAGNOSIS — I10 Essential (primary) hypertension: Secondary | ICD-10-CM

## 2016-11-23 DIAGNOSIS — R319 Hematuria, unspecified: Secondary | ICD-10-CM | POA: Diagnosis not present

## 2016-11-23 DIAGNOSIS — Z23 Encounter for immunization: Secondary | ICD-10-CM

## 2016-11-23 DIAGNOSIS — E7800 Pure hypercholesterolemia, unspecified: Secondary | ICD-10-CM

## 2016-11-23 DIAGNOSIS — G629 Polyneuropathy, unspecified: Secondary | ICD-10-CM

## 2016-11-23 DIAGNOSIS — E559 Vitamin D deficiency, unspecified: Secondary | ICD-10-CM | POA: Diagnosis not present

## 2016-11-23 DIAGNOSIS — E038 Other specified hypothyroidism: Secondary | ICD-10-CM

## 2016-11-23 DIAGNOSIS — R200 Anesthesia of skin: Secondary | ICD-10-CM

## 2016-11-23 LAB — URINALYSIS, COMPLETE
BILIRUBIN UA: NEGATIVE
Glucose, UA: NEGATIVE
LEUKOCYTES UA: NEGATIVE
NITRITE UA: NEGATIVE
Protein, UA: NEGATIVE
RBC UA: NEGATIVE
SPEC GRAV UA: 1.025 (ref 1.005–1.030)
Urobilinogen, Ur: 1 mg/dL (ref 0.2–1.0)
pH, UA: 5.5 (ref 5.0–7.5)

## 2016-11-23 LAB — MICROSCOPIC EXAMINATION: Renal Epithel, UA: NONE SEEN /hpf

## 2016-11-23 MED ORDER — LEVOTHYROXINE SODIUM 150 MCG PO TABS: ORAL_TABLET | ORAL | 1 refills | Status: DC

## 2016-11-23 MED ORDER — ATENOLOL 50 MG PO TABS: ORAL_TABLET | ORAL | 3 refills | Status: DC

## 2016-11-23 NOTE — Progress Notes (Signed)
Subjective:    Patient ID: Miranda Wilson, female    DOB: Oct 22, 1951, 66 y.o.   MRN: [id number]  HPI Pt here for follow up and management of chronic medical problems which includes hypothyroid, hypertension and hyperlipidemia. She is taking medication regularly.The patient is doing well overall. She is recently had hematuria and low back pain and is questioning whether she could've passed a stone or not. She also continues to fill cold intolerance and has some left foot numbness. She is concerned about her skin hair and nails. She has had a colonoscopy done recently. She will get lab work today. She is also due to get her Pneumovax today. The patient currently takes levo thyroxine 150 g daily for her thyroid replacement. She is taking Crestor for her cholesterol. She is a retired Education officer, [employer]. The patient has had 2 knee replacements on the left knee and a more recent knee replacement on the right knee. She is complaining with numbness and pain in the left foot so bad that recently she went to the grocery store and had a [employer] [place] because of the severity of the pain. She does have some numbness in the foot and it seems to be the entire foot which would be L4-5 and S1. She is also complaining with some discomfort in her right clavicle area and her right elbow. She says she stays cold a lot. She feels that her hair is dry and her nails are thin. She is not taking her calcium and vitamin D regularly. Recently about 2 weeks ago she had an episode of right flank pain and noticed that she had some pinkness to the urine and on the toilet tissue. The pain is gone and she no longer notices any discoloration in her urine. She's never had kidney stones before. She does have a history of interstitial cystitis. We will make sure that we check a urine today. She denies any chest pain or shortness of breath anymore than usual she denies any problems with her intestinal tract. She denies any problems currently with her urinary  tract and says that now the increased frequency that she had when she had the flank pain is gone other than her regular frequency. Her recent and past x-rays were reviewed and I do not see any of the lumbar spine. She has had some degenerative changes in her thoracic spine on a chest x-ray.    Patient Active Problem List   Diagnosis Date Noted  . History of arthroplasty of right knee 03/05/2016  . OA (osteoarthritis) of knee 02/13/2016  . Leg edema 12/07/2015  . Preop cardiovascular exam 12/07/2015  . B12 deficiency 12/08/2014  . Metabolic syndrome 84/66/5993  . Bradycardia 05/31/2014  . Hyperlipemia 04/28/2013  . Vitamin D deficiency 04/28/2013  . Lumbar spondylosis with myelopathy 04/28/2013  . Osteoarthritis 04/28/2013  . Hypertension 04/28/2013  . Incontinence of urine 04/28/2013  . Hypothyroidism 04/28/2013  . Cough 08/21/2011  . Abnormal CXR 08/21/2011   Outpatient Encounter Prescriptions as of 11/23/2016  Medication Sig  . atenolol (TENORMIN) 50 MG tablet TAKE 1 TABLET (50 MG TOTAL) BY MOUTH DAILY.  Marland Kitchen BABY ASPIRIN PO Take 1 tablet by mouth daily.  Marland Kitchen docusate sodium (COLACE) 100 MG capsule Take 100 mg by mouth daily.  Marland Kitchen levothyroxine (SYNTHROID, LEVOTHROID) 150 MCG tablet TAKE 1 TABLET (150 MCG TOTAL) BY MOUTH DAILY.  . Multiple Vitamins-Minerals (CENTRUM SILVER PO) Take 1 tablet by mouth daily.  . nabumetone (RELAFEN) 500 MG tablet Take 2 tablets  by mouth daily as needed.  Marland Kitchen Propylene Glycol (SYSTANE BALANCE OP) Apply 1 drop to eye 2 (two) times daily as needed (Dry eyes). Reported on 01/31/2016  . rosuvastatin (CRESTOR) 40 MG tablet TAKE 1 TABLET BY MOUTH AS DIRECTED  . [DISCONTINUED] atenolol (TENORMIN) 50 MG tablet TAKE 1 TABLET (50 MG TOTAL) BY MOUTH DAILY.  . [DISCONTINUED] levothyroxine (SYNTHROID, LEVOTHROID) 150 MCG tablet TAKE 1 TABLET (150 MCG TOTAL) BY MOUTH DAILY.  . [DISCONTINUED] triamcinolone cream (KENALOG) 0.1 % Apply 1 application topically 3 (three) times  daily. To affected area of right leg.  . [DISCONTINUED] Icosapent Ethyl (VASCEPA) 1 g CAPS Take 1 capsule by mouth 4 (four) times daily. (Patient not taking: Reported on 10/11/2016)   Facility-Administered Encounter Medications as of 11/23/2016  Medication  . 0.9 %  sodium chloride infusion  . cyanocobalamin ((VITAMIN B-12)) injection 1,000 mcg      Review of Systems  Constitutional: Negative.   HENT: Negative.   Eyes: Negative.   Respiratory: Negative.   Cardiovascular: Negative.   Gastrointestinal: Negative.   Endocrine: Positive for cold intolerance.  Genitourinary: Negative.        Hematuria - recently  Musculoskeletal: Positive for back pain (recent low back pain).  Skin: Negative.        Dry skin, hair loss, brittle nails  Allergic/Immunologic: Negative.   Neurological: Positive for numbness (left foot numbness).  Hematological: Negative.   Psychiatric/Behavioral: Negative.        Objective:   Physical Exam  Constitutional: She is oriented to person, place, and time. She appears well-developed and well-nourished. No distress.  HENT:  Head: Normocephalic and atraumatic.  Right Ear: External ear normal.  Left Ear: External ear normal.  Nose: Nose normal.  Mouth/Throat: Oropharynx is clear and moist. No oropharyngeal exudate.  Eyes: Conjunctivae and EOM are normal. Pupils are equal, round, and reactive to light. Right eye exhibits no discharge. Left eye exhibits no discharge. No scleral icterus.  Neck: Normal range of motion. Neck supple. No thyromegaly present.  Cardiovascular: Normal rate, regular rhythm, normal heart sounds and intact distal pulses.   No murmur heard. Heart is regular at 60/m  Pulmonary/Chest: Effort normal and breath sounds normal. No respiratory distress. She has no wheezes. She has no rales. She exhibits no tenderness.  Abdominal: Soft. Bowel sounds are normal. She exhibits no mass. There is tenderness. There is no rebound and no guarding.  There  is some right upper quadrant tenderness which is most likely scar tissue from her gallbladder surgery. There is no liver or spleen enlargement and there is is some slight suprapubic tenderness.  Musculoskeletal: Normal range of motion. She exhibits no edema or tenderness.  Leg raising is good bilaterally The patient is somewhat stiff with her movements specimen because of the left knee and the back discomfort that she has. She uses a cane for ambulation and to steady her gait  Lymphadenopathy:    She has no cervical adenopathy.  Neurological: She is alert and oriented to person, place, and time. She has normal reflexes. No cranial nerve deficit.  Skin: Skin is warm and dry. No rash noted.  Psychiatric: She has a normal mood and affect. Her behavior is normal. Judgment and thought content normal.  Nursing note and vitals reviewed.  BP 126/70 (BP Location: Left Arm)   Pulse (!) 47   Temp (!) 96.6 F (35.9 C) (Oral)   Ht 5' 3.5" (1.613 m)   Wt 202 lb (91.6 kg)   LMP  04/29/1991   BMI 35.22 kg/m         Assessment & Plan:  1. Essential hypertension, benign -Intended current treatment pending results of lab work - BMP8+EGFR - CBC with Differential/Platelet - Hepatic function panel  2. Vitamin D deficiency -Continue current treatment pending results of lab work - CBC with Differential/Platelet - VITAMIN D 25 Hydroxy (Vit-D Deficiency, Fractures)  3. Other specified hypothyroidism -The last thyroid tests were good and we will not check any other thyroid tests at this visit - CBC with Differential/Platelet - Thyroid Panel With TSH  4. Pure hypercholesterolemia -Continue aggressive therapeutic lifestyle changes and current treatment - CBC with Differential/Platelet - Lipid panel  5. Metabolic syndrome -Continue with efforts at weight loss through diet and exercise - BMP8+EGFR - CBC with Differential/Platelet  6. Numbness of left foot -Take anti-inflammatory medicine as  directed twice daily for 1 week once daily for 1 week and call us back with the results on the foot pain - DG Lumbar Spine 2-3 Views; Future  7. Lumbar degenerative disc disease -Take anti-inflammatory medicine as directed  8. Neuropathy ([place]) -Take any inflammatory medicine as directed for couple weeks and if not improved with her foot pain and numbness we will most likely need to get an MRI of the LS spine.  Meds ordered this encounter  Medications  . levothyroxine (SYNTHROID, LEVOTHROID) 150 MCG tablet    Sig: TAKE 1 TABLET (150 MCG TOTAL) BY MOUTH DAILY.    Dispense:  90 tablet    Refill:  1  . atenolol (TENORMIN) 50 MG tablet    Sig: TAKE 1 TABLET (50 MG TOTAL) BY MOUTH DAILY.    Dispense:  90 tablet    Refill:  3   Patient Instructions                       Medicare Annual Wellness Visit  [facility] and the medical providers at [facility] strive to bring you the best medical care.  In doing so we not only want to address your current medical conditions and concerns but also to detect new conditions early and prevent illness, disease and health-related problems.    Medicare offers a yearly Wellness Visit which allows our clinical staff to assess your need for preventative services including immunizations, lifestyle education, counseling to decrease risk of preventable diseases and screening for fall risk and other medical concerns.    This visit is provided free of charge (no copay) for all Medicare recipients. The clinical pharmacists at [facility] have begun to conduct these Wellness Visits which will also include a thorough review of all your medications.    As you primary medical provider recommend that you make an appointment for your Annual Wellness Visit if you have not done so already this year.  You may set up this appointment before you leave today or you may call back [phone]) and schedule an appointment.  Please make  sure when you call that you mention that you are scheduling your Annual Wellness Visit with the clinical pharmacist so that the appointment may be made for the proper length of time.     Continue current medications. Continue good therapeutic lifestyle changes which include good diet and exercise. Fall precautions discussed with patient. If an FOBT was given today- please return it to our front desk. If you are over 70 years old - you may need Prevnar 42 or the adult Pneumonia vaccine.  **  Flu shots are available--- please call and schedule a FLU-CLINIC appointment**  After your visit with [place] today you will receive a survey in the mail or online from [employer] regarding your care with [place]. Please take a moment to fill this out. Your feedback is very important to [place] as you can help [place] better understand your patient needs as well as improve your experience and satisfaction. WE CARE ABOUT YOU!!!  We will x-ray your back today and call you with those results as soon as they become available You take Relafen 500 twice a day for one week after breakfast and supper and then decrease it to once a day after breakfast for another week You will call in a couple weeks and let us know how the foot pain and numbness is doing. If you're still having problems at that time we may consider getting an MRI He should take your calcium with vitamin D on a more regular basis and we will follow-up on the nails situation at the next visit He should continue to take your multivitamin.   Arrie Senate MD

## 2016-11-23 NOTE — Patient Instructions (Addendum)
Medicare Annual Wellness Visit  [facility] and the medical providers at [facility] strive to bring you the best medical care.  In doing so we not only want to address your current medical conditions and concerns but also to detect new conditions early and prevent illness, disease and health-related problems.    Medicare offers a yearly Wellness Visit which allows our clinical staff to assess your need for preventative services including immunizations, lifestyle education, counseling to decrease risk of preventable diseases and screening for fall risk and other medical concerns.    This visit is provided free of charge (no copay) for all Medicare recipients. The clinical pharmacists at [facility] have begun to conduct these Wellness Visits which will also include a thorough review of all your medications.    As you primary medical provider recommend that you make an appointment for your Annual Wellness Visit if you have not done so already this year.  You may set up this appointment before you leave today or you may call back [phone] and schedule an appointment.  Please make sure when you call that you mention that you are scheduling your Annual Wellness Visit with the clinical pharmacist so that the appointment may be made for the proper length of time.     Continue current medications. Continue good therapeutic lifestyle changes which include good diet and exercise. Fall precautions discussed with patient. If an FOBT was given today- please return it to our front desk. If you are over 46 years old - you may need Prevnar 62 or the adult Pneumonia vaccine.  **Flu shots are available--- please call and schedule a FLU-CLINIC appointment**  After your visit with [place] today you will receive a survey in the mail or online from [employer] regarding your care with [place]. Please take a moment to fill this out. Your feedback is very  important to [place] as you can help [place] better understand your patient needs as well as improve your experience and satisfaction. WE CARE ABOUT YOU!!!  We will x-ray your back today and call you with those results as soon as they become available You take Relafen 500 twice a day for one week after breakfast and supper and then decrease it to once a day after breakfast for another week You will call in a couple weeks and let us know how the foot pain and numbness is doing. If you're still having problems at that time we may consider getting an MRI He should take your calcium with vitamin D on a more regular basis and we will follow-up on the nails situation at the next visit He should continue to take your multivitamin.

## 2016-11-23 NOTE — Addendum Note (Signed)
Addended by: Zannie Cove on: 11/23/2016 11:20 AM   Modules accepted: Orders

## 2016-11-24 LAB — CBC WITH DIFFERENTIAL/PLATELET
BASOS ABS: 0 10*3/uL (ref 0.0–0.2)
Basos: 0 %
EOS (ABSOLUTE): 0.2 10*3/uL (ref 0.0–0.4)
Eos: 4 %
HEMOGLOBIN: 13 g/dL (ref 11.1–15.9)
Hematocrit: 39.5 % (ref 34.0–46.6)
Immature Grans (Abs): 0 10*3/uL (ref 0.0–0.1)
Immature Granulocytes: 0 %
LYMPHS ABS: 1.1 10*3/uL (ref 0.7–3.1)
Lymphs: 23 %
MCH: 27.9 pg (ref 26.6–33.0)
MCHC: 32.9 g/dL (ref 31.5–35.7)
MCV: 85 fL (ref 79–97)
MONOS ABS: 0.4 10*3/uL (ref 0.1–0.9)
Monocytes: 9 %
NEUTROS ABS: 2.9 10*3/uL (ref 1.4–7.0)
Neutrophils: 64 %
Platelets: 185 10*3/uL (ref 150–379)
RBC: 4.66 x10E6/uL (ref 3.77–5.28)
RDW: 16.6 % — AB (ref 12.3–15.4)
WBC: 4.7 10*3/uL (ref 3.4–10.8)

## 2016-11-24 LAB — LIPID PANEL
CHOL/HDL RATIO: 2.6 ratio (ref 0.0–4.4)
CHOLESTEROL TOTAL: 176 mg/dL (ref 100–199)
HDL: 67 mg/dL (ref >39)
LDL CALC: 94 mg/dL (ref 0–99)
Triglycerides: 74 mg/dL (ref 0–149)
VLDL Cholesterol Cal: 15 mg/dL (ref 5–40)

## 2016-11-24 LAB — BMP8+EGFR
BUN / CREAT RATIO: 17 (ref 12–28)
BUN: 15 mg/dL (ref 8–27)
CALCIUM: 9.4 mg/dL (ref 8.7–10.3)
CHLORIDE: 103 mmol/L (ref 96–106)
CO2: 24 mmol/L (ref 18–29)
Creatinine, Ser: 0.9 mg/dL (ref 0.57–1.00)
GFR calc non Af Amer: 66 mL/min/{1.73_m2} (ref >59)
GFR, EST AFRICAN AMERICAN: 77 mL/min/{1.73_m2} (ref >59)
Glucose: 77 mg/dL (ref 65–99)
POTASSIUM: 4.3 mmol/L (ref 3.5–5.2)
SODIUM: 143 mmol/L (ref 134–144)

## 2016-11-24 LAB — THYROID PANEL WITH TSH
Free Thyroxine Index: 2.5 (ref 1.2–4.9)
T3 UPTAKE RATIO: 25 % (ref 24–39)
T4 TOTAL: 10 ug/dL (ref 4.5–12.0)
TSH: 1.04 u[IU]/mL (ref 0.450–4.500)

## 2016-11-24 LAB — HEPATIC FUNCTION PANEL
ALK PHOS: 106 IU/L (ref 39–117)
ALT: 14 IU/L (ref 0–32)
AST: 21 IU/L (ref 0–40)
Albumin: 4.2 g/dL (ref 3.6–4.8)
Bilirubin Total: 0.4 mg/dL (ref 0.0–1.2)
Bilirubin, Direct: 0.14 mg/dL (ref 0.00–0.40)
TOTAL PROTEIN: 6.3 g/dL (ref 6.0–8.5)

## 2016-11-24 LAB — VITAMIN D 25 HYDROXY (VIT D DEFICIENCY, FRACTURES): Vit D, 25-Hydroxy: 36.3 ng/mL (ref 30.0–100.0)

## 2016-11-25 LAB — URINE CULTURE: Organism ID, Bacteria: NO GROWTH

## 2016-12-05 ENCOUNTER — Telehealth: Payer: Self-pay | Admitting: Family Medicine

## 2016-12-05 NOTE — Telephone Encounter (Signed)
appt made

## 2016-12-06 ENCOUNTER — Ambulatory Visit (INDEPENDENT_AMBULATORY_CARE_PROVIDER_SITE_OTHER): Payer: Medicare Other | Admitting: *Deleted

## 2016-12-06 DIAGNOSIS — E538 Deficiency of other specified B group vitamins: Secondary | ICD-10-CM | POA: Diagnosis not present

## 2016-12-06 NOTE — Progress Notes (Signed)
B12 inj given. Pt tolerated well

## 2016-12-10 ENCOUNTER — Ambulatory Visit: Payer: Medicare Other

## 2016-12-16 ENCOUNTER — Other Ambulatory Visit: Payer: Self-pay | Admitting: Family Medicine

## 2016-12-20 ENCOUNTER — Ambulatory Visit (INDEPENDENT_AMBULATORY_CARE_PROVIDER_SITE_OTHER): Payer: Medicare Other | Admitting: *Deleted

## 2016-12-20 VITALS — BP 138/75 | HR 48 | Temp 97.0°F | Ht 63.5 in | Wt 202.0 lb

## 2016-12-20 DIAGNOSIS — Z0000 Encounter for general adult medical examination without abnormal findings: Secondary | ICD-10-CM

## 2016-12-20 NOTE — Patient Instructions (Signed)
  Miranda Wilson , Thank you for taking time to come for your Medicare Wellness Visit. I appreciate your ongoing commitment to your health goals. Please review the following plan we discussed and let me know if I can assist you in the future.   These are the goals we discussed: Goals    . Exercise 3x per week (30 min per time)       This is a list of the screening recommended for you and due dates:  Health Maintenance  Topic Date Due  .  Hepatitis C: One time screening is recommended by Center for Disease Control  (CDC) for  adults born from [birth year] through 1967.   06/23/2017*  . Mammogram  07/26/2018  . Tetanus Vaccine  11/11/2021  . Colon Cancer Screening  10/11/2026  . Flu Shot  Completed  . DEXA scan (bone density measurement)  Completed  . Pneumonia vaccines  Completed  *Topic was postponed. The date shown is not the original due date.   Keep follow up appointments with Dr Laurance Flatten and eye DR  We will check on the zostavax again in the future We will do B12 level and a Hep C screen at the next Office Visit  I will talk with Dr Laurance Flatten about your left foot pain - and we will give you a call

## 2016-12-20 NOTE — Progress Notes (Signed)
Subjective:   Miranda Wilson is a 66 y.o. female who presents for an Initial Medicare Annual Wellness Visit.  Patient here today for annual medicare wellness visit. 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 In her free time, she enjoys reading, working in her flower garden, crafts and cooking. For exercise, she tries to ride on her stationary bike and up until recently went to the local Gym. She states she eats 3 meals a day, 2 of which are home cooked; however, she states they are not as healthy as they should be. She doesn't not go to church as much as she did in the past due to her back and knee problems. She lives in her home with her husband, [name]. They have one daughter, Butch Penny, who lives nearby in [place]. Butch Penny has 2 children. Cailah has a black lab that stays outside. She is aware of tripping/ fall hazards and she is very careful. She states that her health today is better than it was a year ago.      Objective:    Today's Vitals   12/20/16 1032  BP: 138/75  Pulse: (!) 48  Temp: 97 F (36.1 C)  TempSrc: Oral  Weight: 202 lb (91.6 kg)  Height: 5' 3.5" (1.613 m)   Body mass index is 35.22 kg/m.   Current Medications (verified) Outpatient Encounter Prescriptions as of 12/20/2016  Medication Sig  . atenolol (TENORMIN) 50 MG tablet TAKE 1 TABLET (50 MG TOTAL) BY MOUTH DAILY.  Marland Kitchen BABY ASPIRIN PO Take 1 tablet by mouth daily.  Marland Kitchen levothyroxine (SYNTHROID, LEVOTHROID) 150 MCG tablet TAKE 1 TABLET (150 MCG TOTAL) BY MOUTH DAILY.  . Multiple Vitamins-Minerals (CENTRUM SILVER PO) Take 1 tablet by mouth daily.  . nabumetone (RELAFEN) 500 MG tablet Take 2 tablets by mouth daily as needed.  Marland Kitchen Propylene Glycol (SYSTANE BALANCE OP) Apply 1 drop to eye 2 (two) times daily as needed (Dry eyes). Reported on 01/31/2016  . rosuvastatin (CRESTOR) 20 MG tablet Take 20 mg by mouth  daily.  Marland Kitchen docusate sodium (COLACE) 100 MG capsule Take 100 mg by mouth daily as needed.   . [DISCONTINUED] rosuvastatin (CRESTOR) 40 MG tablet TAKE 1 TABLET BY MOUTH AS DIRECTED   Facility-Administered Encounter Medications as of 12/20/2016  Medication  . cyanocobalamin ((VITAMIN B-12)) injection 1,000 mcg  . [DISCONTINUED] 0.9 %  sodium chloride infusion    Allergies (verified) Clindamycin/lincomycin; Codeine; Crestor [rosuvastatin calcium]; Lipitor [atorvastatin calcium]; Pravachol; Simvastatin; Ultram [tramadol hcl]; Vioxx [rofecoxib]; Feldene [piroxicam]; Myrbetriq [mirabegron]; Penicillins; and Toviaz [fesoterodine fumarate er]   History: Past Medical History:  Diagnosis Date  . At risk for sleep apnea   . Bilateral edema of lower extremity    left > right --  wear compression hose  . Cervical spondylosis   . Chronic constipation   . Cystitis [id number]   Dr Prince Rome - here   . GERD (gastroesophageal reflux disease)   . History of colon polyps   . History of idiopathic seizure    AGE 17 to 20  X5  --  UNKNOW IDIOLOGY , PER PT WAS ANEMIC AT THE TIME--  NONE SINCE  . History of tachycardia    S/P  RADIOACTIVE IODINE ABLATION OF THYROID  . Hyperlipidemia   . Hypertension   . Hypothyroidism, postradioiodine therapy    AGE 33  . IBS (irritable bowel syndrome)   .  OA (osteoarthritis)    shoulders  left > right  . PMB (postmenopausal bleeding)   . S/P radioactive iodine thyroid ablation   . Urge urinary incontinence   . Varicose veins   . Vein disorder    LEFT ANKLE VEIN VALVE REFLUX WITH DECREASED CIRCULATION    Past Surgical History:  Procedure Laterality Date  . CATARACT EXTRACTION W/ INTRAOCULAR LENS  IMPLANT, BILATERAL  right 2011//  left 2013  . CESAREAN SECTION  1979  . CLOSED LEFT KNEE MANIPULATION AND RIGHT KNEE CORTISONE INJECTION  02-20-2010  . COLONOSCOPY W/ POLYPECTOMY  10-25-2008  . DILATION AND CURETTAGE OF UTERUS    . ENDOVENOUS ABLATION SAPHENOUS VEIN W/  LASER  04/2007  . EYE SURGERY Bilateral    cataract  . HYSTEROSCOPY W/D&C N/A 11/11/2014   Procedure: DILATATION AND CURETTAGE /HYSTEROSCOPY;  Surgeon: Gus Height, MD;  [facility];  Service: Gynecology;  Laterality: N/A;  . JOINT REPLACEMENT    . LAPAROSCOPIC CHOLECYSTECTOMY  1993   and BILATERAL TUBAL LIGATION  . LEFT KNEE ARTHROTOMY W/ LYSIS ADHESIONS  12-01-2010  . TONSILLECTOMY  1955  . TOTAL KNEE ARTHROPLASTY Left 12-19-2009  . TOTAL KNEE ARTHROPLASTY Right 02/13/2016   Procedure: RIGHT TOTAL KNEE ARTHROPLASTY;  Surgeon: Gaynelle Arabian, MD;  Location: [facility];  Service: Orthopedics;  Laterality: Right;  . TUBAL LIGATION    . [place] to 2010   includes laser and phlebectomies   Family History  Problem Relation Age of Onset  . COPD Mother     smoked  . Parkinsonism Mother   . Arthritis Mother   . Epilepsy Mother     early in 76's  . Myasthenia gravis Father   . Asthma Father   . Emphysema Father     smoked  . Glaucoma Father   . Arthritis Brother     knees  . Heart disease Brother   . Diabetes Brother   . Hyperlipidemia Brother   . Hyperlipidemia Brother   . Hyperlipidemia Brother   . Heart disease Brother   . Hyperlipidemia Brother   . CAD Paternal Grandmother 30  . Epilepsy Daughter 86  . Kidney disease Maternal Uncle   . Colon cancer Neg Hx    Social History   Occupational History  . Retired      Pharmacist, hospital   Social History Main Topics  . Smoking status: Never Smoker  . Smokeless tobacco: Never Used  . Alcohol use No  . Drug use: No  . Sexual activity: Yes    Birth control/ protection: None     Comment: not much    Tobacco Counseling Counseling given: Not Answered she has never smoked or used smokeless tobacco.  Activities of Daily Living In your present state of health, do you have any difficulty performing the following activities: 12/20/2016 02/13/2016  Hearing? N N  Vision? Y N  Difficulty concentrating  or making decisions? Y N  Walking or climbing stairs? Y N  Dressing or bathing? Y N  Doing errands, shopping? Y Y  Some recent data might be hidden   She does wear glasses, mostly for reading She states she has several stressors right now, that make it hard for her to concentrate.  Stair and errands are somewhat difficult due to her back and knee problems.   Immunizations and Health Maintenance Immunization History  Administered Date(s) Administered  . Influenza, High Dose Seasonal PF 07/18/2016  . Influenza,inj,Quad PF,36+ Mos 08/18/2015  .  Pneumococcal Conjugate-13 02/03/2015  . Pneumococcal Polysaccharide-23 11/23/2016  . Td 11/12/2011   We dicussed the shingles vaccine today and she prefers to wait on this as she may be around someone whom is immunocompromised.  The cost today would have been ZERO with her insurance.   There are no preventive care reminders to display for this patient. We will do a B12 level and a Hepatitis C screening on her at her next OV.  Patient Care Team: Chipper Herb, MD as PCP - General (Family Medicine) Chipper Herb, MD as Referring Physician (Family Medicine)  Indicate any recent Medical Services you may have received from other than [facility] providers in the past year (date may be approximate).     Assessment:   This is a routine wellness examination for [place].   Hearing/Vision screen No exam data present  Dietary issues and exercise activities discussed:  She will began to eat better and work on getting less fats and starches in her diet. She will hopefully soon get back into a gym and exercise routine.  Goals    . Exercise 3x per week (30 min per time)      Depression Screen PHQ 2/9 Scores 12/20/2016 11/23/2016 07/18/2016 03/05/2016 01/31/2016 10/26/2015 06/15/2015  PHQ - 2 Score 0 0 0 0 0 0 0    Fall Risk Fall Risk  12/20/2016 11/23/2016 07/18/2016 03/05/2016 01/31/2016  Falls in the past year? No No No No No  Number falls in past yr: - - -  - -  Injury with Fall? - - - - -  Risk for fall due to : - - - - -    Cognitive Function: MMSE - Mini Mental State Exam 12/20/2016  Orientation to time 5  Orientation to Place 5  Registration 3  Attention/ Calculation 5  Recall 3  Language- name 2 objects 2  Language- repeat 1  Language- follow 3 step command 3  Language- read & follow direction 1  Write a sentence 1  Copy design 1  Total score 30    score 30 out of 30.    Screening Tests Health Maintenance  Topic Date Due  . Hepatitis C Screening  06/23/2017 (Originally 1951/06/18)  . MAMMOGRAM  07/26/2018  . TETANUS/TDAP  11/11/2021  . COLONOSCOPY  10/11/2026  . INFLUENZA VACCINE  Completed  . DEXA SCAN  Completed  . PNA vac Low Risk Adult  Completed   All HM up to date  - she will schedule an eye exam    Plan:     During the course of the visit, Feliciana was educated and counseled about the following appropriate screening and preventive services:   Vaccines to include Pneumoccal, Influenza, Hepatitis B, Td, Zostavax, HCV  Electrocardiogram  Cardiovascular disease screening  Colorectal cancer screening  Bone density screening  Diabetes screening  Glaucoma screening  Mammography/PAP  Nutrition counseling  Smoking cessation counseling\  Keep follow up appointments with Dr Laurance Flatten and eye DR Bring in a copy of advanced directives (new packet given today just in the case she may want to make any changes) Check on the cost of shingles vaccine at a later date.  Patient Instructions (the written plan) were given to the patient.    Marwan Lipe, Cameron Proud, LPN   [id number]   I have reviewed and agree with the above AWV documentation.   Mary-Margaret Hassell Done, FNP

## 2016-12-23 ENCOUNTER — Encounter: Payer: Self-pay | Admitting: Family Medicine

## 2017-01-04 ENCOUNTER — Ambulatory Visit: Payer: Medicare Other | Admitting: Family Medicine

## 2017-01-04 ENCOUNTER — Ambulatory Visit: Payer: Medicare Other

## 2017-01-07 ENCOUNTER — Ambulatory Visit: Payer: Medicare Other

## 2017-01-10 ENCOUNTER — Ambulatory Visit (INDEPENDENT_AMBULATORY_CARE_PROVIDER_SITE_OTHER): Payer: Medicare Other | Admitting: *Deleted

## 2017-01-10 DIAGNOSIS — E538 Deficiency of other specified B group vitamins: Secondary | ICD-10-CM | POA: Diagnosis not present

## 2017-01-10 NOTE — Progress Notes (Signed)
Pt given Vit B12 inj Tolerated well 

## 2017-01-15 ENCOUNTER — Telehealth: Payer: Self-pay | Admitting: Family Medicine

## 2017-01-15 NOTE — Telephone Encounter (Signed)
Pt aware of recommendations

## 2017-01-15 NOTE — Telephone Encounter (Signed)
Resume atenolol at previous dose to keep blood pressure under best control

## 2017-01-30 ENCOUNTER — Telehealth: Payer: Self-pay | Admitting: Family Medicine

## 2017-01-30 NOTE — Telephone Encounter (Signed)
What symptoms do you have? Seasonal allergies  How long have you been sick? 01/26/2017  Have you been seen for this problem?no  If your provider decides to give you a prescription, which pharmacy would you like for it to be sent to? Wants to know what OTC medication can she take that will not interefere with meds she is currently taking   Patient informed that this information will be sent to the clinical staff for review and that they should receive a follow up call.

## 2017-01-30 NOTE — Telephone Encounter (Signed)
Patient aware to try mucinex, nasal sprays,allergy medications that may be purchased over the counter.  If symptoms worsen or do not resolve , call to schedule an appointment.

## 2017-02-19 ENCOUNTER — Encounter: Payer: Self-pay | Admitting: Family Medicine

## 2017-02-19 ENCOUNTER — Ambulatory Visit (INDEPENDENT_AMBULATORY_CARE_PROVIDER_SITE_OTHER): Payer: Medicare Other | Admitting: Family Medicine

## 2017-02-19 VITALS — BP 121/62 | HR 62 | Temp 97.6°F | Ht 63.5 in | Wt 204.0 lb

## 2017-02-19 DIAGNOSIS — J329 Chronic sinusitis, unspecified: Secondary | ICD-10-CM

## 2017-02-19 DIAGNOSIS — J40 Bronchitis, not specified as acute or chronic: Secondary | ICD-10-CM

## 2017-02-19 DIAGNOSIS — J301 Allergic rhinitis due to pollen: Secondary | ICD-10-CM

## 2017-02-19 MED ORDER — FEXOFENADINE-PSEUDOEPHED ER 180-240 MG PO TB24: 1.0000 | ORAL_TABLET | Freq: Every evening | ORAL | 11 refills | Status: DC

## 2017-02-19 MED ORDER — BETAMETHASONE SOD PHOS & ACET 6 (3-3) MG/ML IJ SUSP
6.0000 mg | Freq: Once | INTRAMUSCULAR | Status: AC
  Administered 2017-02-19: 6 mg via INTRAMUSCULAR

## 2017-02-19 MED ORDER — CEFUROXIME AXETIL 250 MG PO TABS: 250.0000 mg | ORAL_TABLET | Freq: Two times a day (BID) | ORAL | 0 refills | Status: DC

## 2017-02-19 NOTE — Progress Notes (Signed)
Subjective:  Patient ID: Miranda Wilson, female    DOB: [birth year]  Age: 66 y.o. MRN: [id number]  CC: Sinusitis (pt here today c/o cough, congestion and ears itching.)   HPI Miranda Wilson presents for Patient has over the last 3 weeks sneezing frequently sniffling, clear rhinorrhea, watery and itchy eyes. There is some scratchy throat but no sore throat or difficulty swallowing. There is some nasal congestion. Some improvement with Allegra-D 24 during the last week. However, she has developed a profuse cough. She is coughing up copious amounts of yellow to brown sputum as well. She denies fever chills sweats and shortness of breath   History Miranda Wilson has a past medical history of At risk for sleep apnea; Bilateral edema of lower extremity; Cervical spondylosis; Chronic constipation; Cystitis ([id number]); GERD (gastroesophageal reflux disease); History of colon polyps; History of idiopathic seizure; History of tachycardia; Hyperlipidemia; Hypertension; Hypothyroidism, postradioiodine therapy; IBS (irritable bowel syndrome); OA (osteoarthritis); PMB (postmenopausal bleeding); S/P radioactive iodine thyroid ablation; Urge urinary incontinence; Varicose veins; and Vein disorder.    She reports that she has never smoked. She has never used smokeless tobacco. She reports that she does not drink alcohol or use drugs.  Current Outpatient Prescriptions on File Prior to Visit  Medication Sig Dispense Refill  . atenolol (TENORMIN) 50 MG tablet TAKE 1 TABLET (50 MG TOTAL) BY MOUTH DAILY. 90 tablet 3  . BABY ASPIRIN PO Take 1 tablet by mouth daily.    Marland Kitchen docusate sodium (COLACE) 100 MG capsule Take 100 mg by mouth daily as needed.     Marland Kitchen levothyroxine (SYNTHROID, LEVOTHROID) 150 MCG tablet TAKE 1 TABLET (150 MCG TOTAL) BY MOUTH DAILY. 90 tablet 1  . Multiple Vitamins-Minerals (CENTRUM SILVER PO) Take 1 tablet by mouth daily.    . nabumetone (RELAFEN) 500 MG tablet Take 2 tablets by mouth daily as  needed.  1  . Propylene Glycol (SYSTANE BALANCE OP) Apply 1 drop to eye 2 (two) times daily as needed (Dry eyes). Reported on 01/31/2016    . rosuvastatin (CRESTOR) 20 MG tablet Take 20 mg by mouth daily.     Current Facility-Administered Medications on File Prior to Visit  Medication Dose Route Frequency Provider Last Rate Last Dose  . cyanocobalamin ((VITAMIN B-12)) injection 1,000 mcg  1,000 mcg Intramuscular Q30 days Chipper Herb, MD   1,000 mcg at 01/10/17 1121    ROS Review of Systems  Constitutional: Negative for appetite change, chills, diaphoresis, fatigue and fever.  HENT: Positive for congestion, postnasal drip and rhinorrhea. Negative for ear pain, hearing loss, sore throat and trouble swallowing.   Eyes: Positive for itching.  Respiratory: Positive for cough. Negative for chest tightness and shortness of breath.   Cardiovascular: Negative for chest pain and palpitations.  Gastrointestinal: Negative for abdominal pain.  Musculoskeletal: Negative for arthralgias.  Skin: Negative for rash.    Objective:  BP 121/62   Pulse 62   Temp 97.6 F (36.4 C) (Oral)   Ht 5' 3.5" (1.613 m)   Wt 204 lb (92.5 kg)   LMP 04/29/1991   BMI 35.57 kg/m   Physical Exam  Constitutional: She appears well-developed and well-nourished.  HENT:  Head: Normocephalic and atraumatic.  Right Ear: Tympanic membrane and external ear normal. No decreased hearing is noted.  Left Ear: Tympanic membrane and external ear normal. No decreased hearing is noted.  Nose: Mucosal edema present. Right sinus exhibits no frontal sinus tenderness. Left sinus exhibits no frontal sinus tenderness.  Mouth/Throat:  No oropharyngeal exudate or posterior oropharyngeal erythema.  Neck: No Brudzinski's sign noted.  Pulmonary/Chest: Effort normal. No respiratory distress. She has wheezes (bronchovesicular changes).  Lymphadenopathy:       Head (right side): No preauricular adenopathy present.       Head (left side): No  preauricular adenopathy present.       Right cervical: No superficial cervical adenopathy present.      Left cervical: No superficial cervical adenopathy present.    Assessment & Plan:   Aarya was seen today for sinusitis.  Diagnoses and all orders for this visit:  Seasonal allergic rhinitis due to pollen -     betamethasone acetate-betamethasone sodium phosphate (CELESTONE) injection 6 mg; Inject 1 mL (6 mg total) into the muscle once.  Sinobronchitis  Other orders -     cefUROXime (CEFTIN) 250 MG tablet; Take 1 tablet (250 mg total) by mouth 2 (two) times daily with a meal. -     fexofenadine-pseudoephedrine (ALLEGRA-D 24) 180-240 MG 24 hr tablet; Take 1 tablet by mouth every evening. For allergy and congestion   I am having Ms. Devos start on cefUROXime and fexofenadine-pseudoephedrine. I am also having her maintain her docusate sodium, Propylene Glycol (SYSTANE BALANCE OP), nabumetone, Multiple Vitamins-Minerals (CENTRUM SILVER PO), BABY ASPIRIN PO, levothyroxine, atenolol, and rosuvastatin. We will continue to administer cyanocobalamin and betamethasone acetate-betamethasone sodium phosphate.  Meds ordered this encounter  Medications  . cefUROXime (CEFTIN) 250 MG tablet    Sig: Take 1 tablet (250 mg total) by mouth 2 (two) times daily with a meal.    Dispense:  20 tablet    Refill:  0  . fexofenadine-pseudoephedrine (ALLEGRA-D 24) 180-240 MG 24 hr tablet    Sig: Take 1 tablet by mouth every evening. For allergy and congestion    Dispense:  30 tablet    Refill:  11  . betamethasone acetate-betamethasone sodium phosphate (CELESTONE) injection 6 mg     Follow-up: Return if symptoms worsen or fail to improve.  Claretta Fraise, M.D.

## 2017-02-22 ENCOUNTER — Other Ambulatory Visit: Payer: Self-pay | Admitting: Family Medicine

## 2017-02-22 ENCOUNTER — Telehealth: Payer: Self-pay | Admitting: Family Medicine

## 2017-02-22 MED ORDER — LEVOFLOXACIN 500 MG PO TABS: 500.0000 mg | ORAL_TABLET | Freq: Every day | ORAL | 0 refills | Status: DC

## 2017-02-22 NOTE — Telephone Encounter (Signed)
Patient is wanting to make sure that you want her to d/c the cefuroxine. Revived your recommendations with her but she wanted to make sure.  She did take a course of it this morning with breakfast. Please advise.

## 2017-02-22 NOTE — Telephone Encounter (Signed)
Please contact the patient I will upgrade her antibiotic but she was pretty sick so I think is going to take a few days. If she starts getting short of breath or having high fever she should follow-up in the office

## 2017-02-22 NOTE — Telephone Encounter (Signed)
sneezing, congestion, runny nose, scratchy throat - please address - since you seen this pt last for this

## 2017-02-22 NOTE — Telephone Encounter (Signed)
Please contact the patient to DC Cefuroxime

## 2017-02-22 NOTE — Telephone Encounter (Signed)
Refer to other phone note . 

## 2017-02-22 NOTE — Telephone Encounter (Signed)
Attempted to call back patient- no answer/ no vm.

## 2017-02-22 NOTE — Telephone Encounter (Signed)
What symptoms do you have? Same symptoms as at visit. Cough. Saw stacks  How long have you been sick? About a month  Have you been seen for this problem? yes  If your provider decides to give you a prescription, which pharmacy would you like for it to be sent to? [facility] in [place].   Patient informed that this information will be sent to the clinical staff for review and that they should receive a follow up call.

## 2017-02-22 NOTE — Telephone Encounter (Signed)
Patient aware and verbalizes understanding. 

## 2017-03-20 ENCOUNTER — Ambulatory Visit (INDEPENDENT_AMBULATORY_CARE_PROVIDER_SITE_OTHER): Payer: Medicare Other

## 2017-03-20 ENCOUNTER — Ambulatory Visit (INDEPENDENT_AMBULATORY_CARE_PROVIDER_SITE_OTHER): Payer: Medicare Other | Admitting: Family Medicine

## 2017-03-20 ENCOUNTER — Encounter: Payer: Self-pay | Admitting: Family Medicine

## 2017-03-20 VITALS — BP 136/81 | HR 57 | Temp 97.0°F | Ht 63.5 in | Wt 202.0 lb

## 2017-03-20 DIAGNOSIS — R05 Cough: Secondary | ICD-10-CM | POA: Diagnosis not present

## 2017-03-20 DIAGNOSIS — R059 Cough, unspecified: Secondary | ICD-10-CM

## 2017-03-20 MED ORDER — FLUTICASONE FUROATE-VILANTEROL 200-25 MCG/INH IN AEPB: 1.0000 | INHALATION_SPRAY | Freq: Every day | RESPIRATORY_TRACT | 11 refills | Status: DC

## 2017-03-20 NOTE — Progress Notes (Signed)
Subjective:  Patient ID: Miranda Wilson, female    DOB: [birth year]  Age: 66 y.o. MRN: [id number]  CC: Cough (pt here today c/o cough since Easter and it won't go away)   HPI Miranda Wilson presents for Persistent cough. This problem has continued to get worse since her evaluation one month ago. It is nonproductive. It is not associated with shortness of breath. It seems to get worse after eating. Also worse when she lays down at night trying to get sleep. It has not awakened her from sleep. There have been no fever chills or sweats. Patient states that she was exposed to secondhand smoke the entire time she was growing up until age 41 because both parents smoked commonly in the home. She has had no previous diagnosis of asthma or COPD. She has never been a smoker herself. Her activities have not been limited by this cough.  History Miranda Wilson has a past medical history of At risk for sleep apnea; Bilateral edema of lower extremity; Cervical spondylosis; Chronic constipation; Cystitis (7948-0165'V); GERD (gastroesophageal reflux disease); History of colon polyps; History of idiopathic seizure; History of tachycardia; Hyperlipidemia; Hypertension; Hypothyroidism, postradioiodine therapy; IBS (irritable bowel syndrome); OA (osteoarthritis); PMB (postmenopausal bleeding); S/P radioactive iodine thyroid ablation; Urge urinary incontinence; Varicose veins; and Vein disorder.   She has a past surgical history that includes Varicose vein surgery (1998 to 2010); Cesarean section (1979); Endovenous ablation saphenous vein w/ laser (04/2007); Tonsillectomy (1955); Colonoscopy w/ polypectomy (10-25-2008); Total knee arthroplasty (Left, 12-19-2009); CLOSED LEFT KNEE MANIPULATION AND RIGHT KNEE CORTISONE INJECTION (02-20-2010); LEFT KNEE ARTHROTOMY W/ LYSIS ADHESIONS (12-01-2010); Laparoscopic cholecystectomy (3748); Cataract extraction w/ intraocular lens  implant, bilateral (right 2011//  left 2013); Hysteroscopy  w/D&C (N/A, 11/11/2014); Dilation and curettage of uterus; Joint replacement; Total knee arthroplasty (Right, 02/13/2016); Eye surgery (Bilateral); and Tubal ligation.   Her family history includes Arthritis in her brother and mother; Asthma in her father; CAD (age of onset: 9) in her paternal grandmother; COPD in her mother; Diabetes in her brother; Emphysema in her father; Epilepsy in her mother; Epilepsy (age of onset: 82) in her daughter; Glaucoma in her father; Heart disease in her brother and brother; Hyperlipidemia in her brother, brother, brother, and brother; Kidney disease in her maternal uncle; Myasthenia gravis in her father; Parkinsonism in her mother.She reports that she has never smoked. She has never used smokeless tobacco. She reports that she does not drink alcohol or use drugs.  Current Outpatient Prescriptions on File Prior to Visit  Medication Sig Dispense Refill  . atenolol (TENORMIN) 50 MG tablet TAKE 1 TABLET (50 MG TOTAL) BY MOUTH DAILY. 90 tablet 3  . BABY ASPIRIN PO Take 1 tablet by mouth daily.    Marland Kitchen docusate sodium (COLACE) 100 MG capsule Take 100 mg by mouth daily as needed.     . fexofenadine-pseudoephedrine (ALLEGRA-D 24) 180-240 MG 24 hr tablet Take 1 tablet by mouth every evening. For allergy and congestion 30 tablet 11  . levothyroxine (SYNTHROID, LEVOTHROID) 150 MCG tablet TAKE 1 TABLET (150 MCG TOTAL) BY MOUTH DAILY. 90 tablet 1  . Multiple Vitamins-Minerals (CENTRUM SILVER PO) Take 1 tablet by mouth daily.    . nabumetone (RELAFEN) 500 MG tablet Take 2 tablets by mouth daily as needed.  1  . Propylene Glycol (SYSTANE BALANCE OP) Apply 1 drop to eye 2 (two) times daily as needed (Dry eyes). Reported on 01/31/2016    . rosuvastatin (CRESTOR) 20 MG tablet Take 20 mg by mouth  daily.     Current Facility-Administered Medications on File Prior to Visit  Medication Dose Route Frequency Provider Last Rate Last Dose  . cyanocobalamin ((VITAMIN B-12)) injection 1,000 mcg   1,000 mcg Intramuscular Q30 days Chipper Herb, MD   1,000 mcg at 01/10/17 1121    ROS Review of Systems  Constitutional: Negative for appetite change, chills, diaphoresis, fatigue and fever.  HENT: Negative for congestion, ear pain, hearing loss, postnasal drip, rhinorrhea, sore throat and trouble swallowing.   Respiratory: Positive for cough. Negative for chest tightness and shortness of breath.   Cardiovascular: Negative for chest pain and palpitations.  Gastrointestinal: Negative for abdominal pain.  Musculoskeletal: Negative for arthralgias.  Skin: Negative for rash.    Objective:  BP 136/81   Pulse (!) 57   Temp 97 F (36.1 C) (Oral)   Ht 5' 3.5" (1.613 m)   Wt 202 lb (91.6 kg)   LMP 04/29/1991   BMI 35.22 kg/m   Physical Exam  Constitutional: She is oriented to person, place, and time. She appears well-developed and well-nourished.  HENT:  Head: Normocephalic and atraumatic.  Right Ear: Tympanic membrane and external ear normal. No decreased hearing is noted.  Left Ear: Tympanic membrane and external ear normal. No decreased hearing is noted.  Nose: Mucosal edema present. Right sinus exhibits no frontal sinus tenderness. Left sinus exhibits no frontal sinus tenderness.  Mouth/Throat: No oropharyngeal exudate or posterior oropharyngeal erythema.  Neck: No Brudzinski's sign noted.  Cardiovascular: Normal rate, regular rhythm, normal heart sounds and intact distal pulses.   No murmur heard. Pulmonary/Chest: No respiratory distress. She has wheezes (mild bronchovesicular changes widespread).  Musculoskeletal: Normal range of motion. She exhibits no edema or deformity.  Lymphadenopathy:       Head (right side): No preauricular adenopathy present.       Head (left side): No preauricular adenopathy present.       Right cervical: No superficial cervical adenopathy present.      Left cervical: No superficial cervical adenopathy present.  Neurological: She is alert and  oriented to person, place, and time.  Skin: Skin is warm and dry. No erythema.  Psychiatric: She has a normal mood and affect.    Assessment & Plan:   Miranda Wilson was seen today for cough.  Diagnoses and all orders for this visit:  Cough in adult -     DG Chest 2 View; Future -     PR BREATHING CAPACITY TEST -     CBC with Differential/Platelet  Other orders -     fluticasone furoate-vilanterol (BREO ELLIPTA) 200-25 MCG/INH AEPB; Inhale 1 puff into the lungs daily.   I have discontinued Ms. Shark's levofloxacin. I am also having her start on fluticasone furoate-vilanterol. Additionally, I am having her maintain her docusate sodium, Propylene Glycol (SYSTANE BALANCE OP), nabumetone, Multiple Vitamins-Minerals (CENTRUM SILVER PO), BABY ASPIRIN PO, levothyroxine, atenolol, rosuvastatin, and fexofenadine-pseudoephedrine. We will continue to administer cyanocobalamin.  Meds ordered this encounter  Medications  . fluticasone furoate-vilanterol (BREO ELLIPTA) 200-25 MCG/INH AEPB    Sig: Inhale 1 puff into the lungs daily.    Dispense:  1 each    Refill:  11   Pulmonary function shows mild restriction.  Chest x-ray shows no acute changes. Mild eventration of right hemidiaphragm.  Follow-up: No Follow-up on file.  Claretta Fraise, M.D.

## 2017-04-12 ENCOUNTER — Telehealth: Payer: Self-pay | Admitting: Family Medicine

## 2017-04-12 ENCOUNTER — Encounter: Payer: Self-pay | Admitting: Family Medicine

## 2017-04-12 ENCOUNTER — Ambulatory Visit (INDEPENDENT_AMBULATORY_CARE_PROVIDER_SITE_OTHER): Payer: Medicare Other | Admitting: Family Medicine

## 2017-04-12 VITALS — BP 122/84 | HR 51 | Temp 97.3°F | Ht 63.5 in | Wt 203.0 lb

## 2017-04-12 DIAGNOSIS — I10 Essential (primary) hypertension: Secondary | ICD-10-CM

## 2017-04-12 DIAGNOSIS — E8881 Metabolic syndrome: Secondary | ICD-10-CM | POA: Diagnosis not present

## 2017-04-12 DIAGNOSIS — E559 Vitamin D deficiency, unspecified: Secondary | ICD-10-CM | POA: Diagnosis not present

## 2017-04-12 DIAGNOSIS — E038 Other specified hypothyroidism: Secondary | ICD-10-CM

## 2017-04-12 DIAGNOSIS — J9801 Acute bronchospasm: Secondary | ICD-10-CM | POA: Diagnosis not present

## 2017-04-12 DIAGNOSIS — E538 Deficiency of other specified B group vitamins: Secondary | ICD-10-CM | POA: Diagnosis not present

## 2017-04-12 DIAGNOSIS — E7800 Pure hypercholesterolemia, unspecified: Secondary | ICD-10-CM

## 2017-04-12 MED ORDER — CYANOCOBALAMIN 1000 MCG/ML IJ SOLN
1000.0000 ug | INTRAMUSCULAR | Status: AC
  Administered 2017-04-12 – 2018-03-27 (×5): 1000 ug via INTRAMUSCULAR

## 2017-04-12 MED ORDER — FLUTICASONE FUROATE-VILANTEROL 200-25 MCG/INH IN AEPB: 1.0000 | INHALATION_SPRAY | Freq: Every day | RESPIRATORY_TRACT | 11 refills | Status: DC

## 2017-04-12 MED ORDER — ALBUTEROL SULFATE HFA 108 (90 BASE) MCG/ACT IN AERS: 2.0000 | INHALATION_SPRAY | Freq: Four times a day (QID) | RESPIRATORY_TRACT | 5 refills | Status: DC | PRN

## 2017-04-12 NOTE — Progress Notes (Signed)
Subjective:    Patient ID: Miranda Wilson, female    DOB: 1951-07-11, 66 y.o.   MRN: [id number]  HPI Pt here for follow up and management of chronic medical problems which includes hypertension, hypothyroid, and hyperlipidemia. She is taking medication regularly.The patient today as a question of whether to continue her inhaler or not. She is due to get lab work and will be given an FOBT to return. She is scheduled on July 10 for dental implants. No signs are stable and her weight is stable. Her body mass index is 35.3 and she does have 2 or more risk factors that her comorbid. The patient denies chest pain or shortness of breath. Her coughing is some better. She denies any trouble with her stomach including nausea vomiting diarrhea blood in the stool or black tarry bowel movements. She is passing her water without problems. She does have urinary frequency. She has 2 brothers that have diabetes. The patient indicates that she was exposed to second hand smoke because both parents smoked for at least 15 years and she was with them.     Patient Active Problem List   Diagnosis Date Noted  . History of arthroplasty of right knee 03/05/2016  . OA (osteoarthritis) of knee 02/13/2016  . Leg edema 12/07/2015  . Preop cardiovascular exam 12/07/2015  . B12 deficiency 12/08/2014  . Metabolic syndrome 67/34/1937  . Bradycardia 05/31/2014  . Hyperlipemia 04/28/2013  . Vitamin D deficiency 04/28/2013  . Lumbar spondylosis with myelopathy 04/28/2013  . Osteoarthritis 04/28/2013  . Hypertension 04/28/2013  . Incontinence of urine 04/28/2013  . Hypothyroidism 04/28/2013  . Cough 08/21/2011  . Abnormal CXR 08/21/2011   Outpatient Encounter Prescriptions as of 04/12/2017  Medication Sig  . atenolol (TENORMIN) 50 MG tablet TAKE 1 TABLET (50 MG TOTAL) BY MOUTH DAILY.  Marland Kitchen BABY ASPIRIN PO Take 1 tablet by mouth daily.  . Cholecalciferol (VITAMIN D) 2000 units CAPS Take by mouth.  . docusate sodium  (COLACE) 100 MG capsule Take 100 mg by mouth daily as needed.   . fluticasone furoate-vilanterol (BREO ELLIPTA) 200-25 MCG/INH AEPB Inhale 1 puff into the lungs daily.  Marland Kitchen levothyroxine (SYNTHROID, LEVOTHROID) 150 MCG tablet TAKE 1 TABLET (150 MCG TOTAL) BY MOUTH DAILY.  . Multiple Vitamins-Minerals (CENTRUM SILVER PO) Take 1 tablet by mouth daily.  . nabumetone (RELAFEN) 500 MG tablet Take 2 tablets by mouth daily as needed.  Marland Kitchen Propylene Glycol (SYSTANE BALANCE OP) Apply 1 drop to eye 2 (two) times daily as needed (Dry eyes). Reported on 01/31/2016  . rosuvastatin (CRESTOR) 20 MG tablet Take 20 mg by mouth daily.  . [DISCONTINUED] fexofenadine-pseudoephedrine (ALLEGRA-D 24) 180-240 MG 24 hr tablet Take 1 tablet by mouth every evening. For allergy and congestion   No facility-administered encounter medications on file as of 04/12/2017.      Review of Systems  Constitutional: Negative.   HENT: Negative.   Eyes: Negative.   Respiratory: Negative.        Recent breathing issues = on BREO  Cardiovascular: Negative.   Gastrointestinal: Negative.   Endocrine: Negative.   Genitourinary: Negative.   Musculoskeletal: Negative.   Skin: Negative.   Allergic/Immunologic: Negative.   Neurological: Negative.   Hematological: Negative.   Psychiatric/Behavioral: Negative.        Objective:   Physical Exam  Constitutional: She is oriented to person, place, and time. She appears well-developed and well-nourished.  The patient is doing well overall. She is recovering from her knee surgeries but  still uses a cane. She says she likes sugar and there is a family history of diabetes and her family.  HENT:  Head: Normocephalic and atraumatic.  Right Ear: External ear normal.  Left Ear: External ear normal.  Nose: Nose normal.  Mouth/Throat: Oropharynx is clear and moist.  Eyes: Conjunctivae and EOM are normal. Pupils are equal, round, and reactive to light. Right eye exhibits no discharge. Left eye  exhibits no discharge. No scleral icterus.  Neck: Normal range of motion. Neck supple. No thyromegaly present.  No bruits thyromegaly or anterior cervical adenopathy  Cardiovascular: Normal rate, regular rhythm and normal heart sounds.   No murmur heard. Distal pulses were difficult to palpate. She did have on support hose and this probably played a role with being able to palpate the pulses. The heart is regular at 60/m  Pulmonary/Chest: Effort normal and breath sounds normal. No respiratory distress. She has no wheezes. She has no rales.  Clear anteriorly and posteriorly with a dry cough. No wheezes or rhonchi.  Abdominal: Soft. Bowel sounds are normal. She exhibits no mass. There is no tenderness. There is no rebound and no guarding.  No abdominal tenderness or organ enlargement or bruits  Musculoskeletal: She exhibits no edema.  The patient uses a cane for ambulation and still has some stiffness in both knees. She has had one knee that has been replaced twice and this is the left knee. The right knee has been replaced once. She is doing well with this surgery.  Lymphadenopathy:    She has no cervical adenopathy.  Neurological: She is alert and oriented to person, place, and time. She has normal reflexes. No cranial nerve deficit.  Skin: Skin is warm and dry. No rash noted.  Psychiatric: She has a normal mood and affect. Her behavior is normal. Judgment and thought content normal.  Nursing note and vitals reviewed.   BP 122/84 (BP Location: Right Arm)   Pulse (!) 51   Temp 97.3 F (36.3 C) (Oral)   Ht 5' 3.5" (1.613 m)   Wt 203 lb (92.1 kg)   LMP 04/29/1991   BMI 35.40 kg/m        Assessment & Plan:  1. Essential hypertension, benign -The blood pressure good today and she will continue with current treatment - BMP8+EGFR - CBC with Differential/Platelet - Hepatic function panel  2. Vitamin D deficiency -Continue with vitamin D replacement pending results of lab work - CBC  with Differential/Platelet - VITAMIN D 25 Hydroxy (Vit-D Deficiency, Fractures)  3. Other specified hypothyroidism -Continue with thyroid replacement pending results of lab work - CBC with Differential/Platelet - Thyroid Panel With TSH  4. Metabolic syndrome -Continue to make every effort at pushing the sweets away and reducing sugar in the diet - BMP8+EGFR - CBC with Differential/Platelet  5. Pure hypercholesterolemia -Continue with current treatment pending results of lab work - CBC with Differential/Platelet - NMR, lipoprofile  6. Vitamin B 12 deficiency -Continue with current treatment - Vitamin B12 - cyanocobalamin ((VITAMIN B-12)) injection 1,000 mcg; Inject 1 mL (1,000 mcg total) into the muscle every 30 (thirty) days.  7. Bronchospasm -Continue with rescue inhaler and maintenance inhaler as directed  8. Morbid obesity -The patient will continue to work on her weight. She has a BMI that is 35.3 and has to or more comorbidities health problems which include hyperlipidemia hypertension and hypothyroidism. She will work aggressively on pushing the sweets away in her diet  Patient Instructions  Medicare Annual Wellness Visit  [facility] and the medical providers at [facility] strive to bring you the best medical care.  In doing so we not only want to address your current medical conditions and concerns but also to detect new conditions early and prevent illness, disease and health-related problems.    Medicare offers a yearly Wellness Visit which allows our clinical staff to assess your need for preventative services including immunizations, lifestyle education, counseling to decrease risk of preventable diseases and screening for fall risk and other medical concerns.    This visit is provided free of charge (no copay) for all Medicare recipients. The clinical pharmacists at [facility] have begun to conduct  these Wellness Visits which will also include a thorough review of all your medications.    As you primary medical provider recommend that you make an appointment for your Annual Wellness Visit if you have not done so already this year.  You may set up this appointment before you leave today or you may call back ([phone]) and schedule an appointment.  Please make sure when you call that you mention that you are scheduling your Annual Wellness Visit with the clinical pharmacist so that the appointment may be made for the proper length of time.     Continue current medications. Continue good therapeutic lifestyle changes which include good diet and exercise. Fall precautions discussed with patient. If an FOBT was given today- please return it to our front desk. If you are over 38 years old - you may need Prevnar 37 or the adult Pneumonia vaccine.  **Flu shots are available--- please call and schedule a FLU-CLINIC appointment**  After your visit with [place] today you will receive a survey in the mail or online from [employer] regarding your care with [place]. Please take a moment to fill this out. Your feedback is very important to [place] as you can help [place] better understand your patient needs as well as improve your experience and satisfaction. WE CARE ABOUT YOU!!!   Continue to avoid irritating environments Wear respiratory protection if in an irritated environment Use albuterol inhaler as a rescue inhaler Continue with the Brio inhaler and rinsing mouth out after using this. May try to reduce this time and see if cough gets worse. Reduce the use of overhead fans No wheeze heat in the wintertime and use cool mist humidification Reduce the use of sugar  Arrie Senate MD

## 2017-04-12 NOTE — Patient Instructions (Addendum)
Medicare Annual Wellness Visit  [facility] and the medical providers at [facility] strive to bring you the best medical care.  In doing so we not only want to address your current medical conditions and concerns but also to detect new conditions early and prevent illness, disease and health-related problems.    Medicare offers a yearly Wellness Visit which allows our clinical staff to assess your need for preventative services including immunizations, lifestyle education, counseling to decrease risk of preventable diseases and screening for fall risk and other medical concerns.    This visit is provided free of charge (no copay) for all Medicare recipients. The clinical pharmacists at [facility] have begun to conduct these Wellness Visits which will also include a thorough review of all your medications.    As you primary medical provider recommend that you make an appointment for your Annual Wellness Visit if you have not done so already this year.  You may set up this appointment before you leave today or you may call back ([phone]) and schedule an appointment.  Please make sure when you call that you mention that you are scheduling your Annual Wellness Visit with the clinical pharmacist so that the appointment may be made for the proper length of time.     Continue current medications. Continue good therapeutic lifestyle changes which include good diet and exercise. Fall precautions discussed with patient. If an FOBT was given today- please return it to our front desk. If you are over 22 years old - you may need Prevnar 75 or the adult Pneumonia vaccine.  **Flu shots are available--- please call and schedule a FLU-CLINIC appointment**  After your visit with [place] today you will receive a survey in the mail or online from [employer] regarding your care with [place]. Please take a moment to fill this out. Your feedback is very  important to [place] as you can help [place] better understand your patient needs as well as improve your experience and satisfaction. WE CARE ABOUT YOU!!!   Continue to avoid irritating environments Wear respiratory protection if in an irritated environment Use albuterol inhaler as a rescue inhaler Continue with the Brio inhaler and rinsing mouth out after using this. May try to reduce this time and see if cough gets worse. Reduce the use of overhead fans No wheeze heat in the wintertime and use cool mist humidification Reduce the use of sugar

## 2017-04-12 NOTE — Telephone Encounter (Signed)
Pt should try 100 per Dr Laurance Flatten Pt notified

## 2017-04-13 LAB — CBC WITH DIFFERENTIAL/PLATELET
BASOS: 1 %
Basophils Absolute: 0 10*3/uL (ref 0.0–0.2)
EOS (ABSOLUTE): 0.1 10*3/uL (ref 0.0–0.4)
EOS: 3 %
HEMATOCRIT: 37.6 % (ref 34.0–46.6)
HEMOGLOBIN: 12.3 g/dL (ref 11.1–15.9)
Immature Grans (Abs): 0 10*3/uL (ref 0.0–0.1)
Immature Granulocytes: 0 %
LYMPHS ABS: 0.8 10*3/uL (ref 0.7–3.1)
Lymphs: 22 %
MCH: 27.3 pg (ref 26.6–33.0)
MCHC: 32.7 g/dL (ref 31.5–35.7)
MCV: 83 fL (ref 79–97)
MONOCYTES: 13 %
Monocytes Absolute: 0.5 10*3/uL (ref 0.1–0.9)
NEUTROS ABS: 2.3 10*3/uL (ref 1.4–7.0)
Neutrophils: 61 %
Platelets: 190 10*3/uL (ref 150–379)
RBC: 4.51 x10E6/uL (ref 3.77–5.28)
RDW: 15 % (ref 12.3–15.4)
WBC: 3.7 10*3/uL (ref 3.4–10.8)

## 2017-04-13 LAB — BMP8+EGFR
BUN / CREAT RATIO: 13 (ref 12–28)
BUN: 13 mg/dL (ref 8–27)
CO2: 24 mmol/L (ref 20–29)
Calcium: 9.4 mg/dL (ref 8.7–10.3)
Chloride: 104 mmol/L (ref 96–106)
Creatinine, Ser: 0.99 mg/dL (ref 0.57–1.00)
GFR calc non Af Amer: 59 mL/min/{1.73_m2} — ABNORMAL LOW (ref >59)
GFR, EST AFRICAN AMERICAN: 68 mL/min/{1.73_m2} (ref >59)
Glucose: 86 mg/dL (ref 65–99)
Potassium: 4.7 mmol/L (ref 3.5–5.2)
Sodium: 140 mmol/L (ref 134–144)

## 2017-04-13 LAB — NMR, LIPOPROFILE
Cholesterol: 210 mg/dL — ABNORMAL HIGH (ref 100–199)
HDL Cholesterol by NMR: 61 mg/dL (ref >39)
HDL Particle Number: 36.6 umol/L (ref >=30.5)
LDL Particle Number: 1010 nmol/L — ABNORMAL HIGH (ref <1000)
LDL Size: 22.3 nm (ref >20.5)
LDL-C: 127 mg/dL — ABNORMAL HIGH (ref 0–99)
LP-IR Score: <25 (ref <=45)
Small LDL Particle Number: <90 nmol/L (ref <=527)
Triglycerides by NMR: 109 mg/dL (ref 0–149)

## 2017-04-13 LAB — VITAMIN D 25 HYDROXY (VIT D DEFICIENCY, FRACTURES): Vit D, 25-Hydroxy: 31.9 ng/mL (ref 30.0–100.0)

## 2017-04-13 LAB — VITAMIN B12: Vitamin B-12: 470 pg/mL (ref 232–1245)

## 2017-04-13 LAB — HEPATIC FUNCTION PANEL
ALT: 15 IU/L (ref 0–32)
AST: 24 IU/L (ref 0–40)
Albumin: 4.3 g/dL (ref 3.6–4.8)
Alkaline Phosphatase: 102 IU/L (ref 39–117)
Bilirubin Total: 0.4 mg/dL (ref 0.0–1.2)
Bilirubin, Direct: 0.12 mg/dL (ref 0.00–0.40)
Total Protein: 6.3 g/dL (ref 6.0–8.5)

## 2017-04-13 LAB — THYROID PANEL WITH TSH
Free Thyroxine Index: 2.7 (ref 1.2–4.9)
T3 Uptake Ratio: 26 % (ref 24–39)
T4 TOTAL: 10.4 ug/dL (ref 4.5–12.0)
TSH: 0.303 u[IU]/mL — ABNORMAL LOW (ref 0.450–4.500)

## 2017-08-07 ENCOUNTER — Other Ambulatory Visit: Payer: Self-pay | Admitting: Family Medicine

## 2017-08-07 NOTE — Telephone Encounter (Signed)
Last seen 11/23/16  DWM

## 2017-08-07 NOTE — Telephone Encounter (Signed)
Last refill without being seen 

## 2017-08-15 ENCOUNTER — Encounter: Payer: Self-pay | Admitting: Family Medicine

## 2017-08-15 ENCOUNTER — Ambulatory Visit (INDEPENDENT_AMBULATORY_CARE_PROVIDER_SITE_OTHER): Payer: Medicare Other | Admitting: Family Medicine

## 2017-08-15 VITALS — BP 135/83 | HR 48 | Temp 96.8°F | Ht 63.5 in | Wt 205.0 lb

## 2017-08-15 DIAGNOSIS — E7800 Pure hypercholesterolemia, unspecified: Secondary | ICD-10-CM | POA: Diagnosis not present

## 2017-08-15 DIAGNOSIS — E559 Vitamin D deficiency, unspecified: Secondary | ICD-10-CM

## 2017-08-15 DIAGNOSIS — I10 Essential (primary) hypertension: Secondary | ICD-10-CM | POA: Diagnosis not present

## 2017-08-15 DIAGNOSIS — Z23 Encounter for immunization: Secondary | ICD-10-CM | POA: Diagnosis not present

## 2017-08-15 DIAGNOSIS — E038 Other specified hypothyroidism: Secondary | ICD-10-CM | POA: Diagnosis not present

## 2017-08-15 DIAGNOSIS — E538 Deficiency of other specified B group vitamins: Secondary | ICD-10-CM | POA: Diagnosis not present

## 2017-08-15 DIAGNOSIS — E8881 Metabolic syndrome: Secondary | ICD-10-CM

## 2017-08-15 MED ORDER — LEVOTHYROXINE SODIUM 150 MCG PO TABS: ORAL_TABLET | ORAL | 1 refills | Status: DC

## 2017-08-15 NOTE — Progress Notes (Signed)
Subjective:    Patient ID: Miranda Wilson, female    DOB: 11/22/1950, 66 y.o.   MRN: [id number]  HPI Pt here for follow up and management of chronic medical problems which includes hypothyroid, hyperlipidemia, and hypertension. She is taking medication regularly.  The patient has had recently 3 dental implants.  She is also had recent eye changes.  She is requesting a refill on her levothyroxine.  She will be given an FOBT to return and get lab work today.  She is also due to get her mammogram and she will schedule this.  She will also get a flu shot today.  She is pleasant and doing well.  She still has ongoing problems with her left knee and the orthopedic surgeon does not feel like he wants to do any further surgery because of all the scar tissue with this knee.  She is also continuing with the healing of the implants in her mouth.  She has had some changes with her eye and this was due to some scar tissue on the recent cataract surgery and she had a procedure yesterday that has helped this tremendously.  There is also some concern about a papilloma on the left lower lid and she is thinking about getting surgery to have this removed also.  She also continues to have problems that have been ongoing for years with incontinence and we talked about this and since a lot of new procedures are being developed to help control incontinence she is going to call the urologist back and set up an appointment to have this reevaluated with any new suggestions from him they can help her incontinence.  She does deny any chest pain or shortness of breath.  Her only complaints with her intestinal tract or constipation.  She denies any nausea vomiting diarrhea or black tarry bowel movements.  She is passing her water okay just the incontinence is the big issue.     Patient Active Problem List   Diagnosis Date Noted  . History of arthroplasty of right knee 03/05/2016  . OA (osteoarthritis) of knee 02/13/2016  . Leg  edema 12/07/2015  . Preop cardiovascular exam 12/07/2015  . B12 deficiency 12/08/2014  . Metabolic syndrome 76/28/3151  . Bradycardia 05/31/2014  . Hyperlipemia 04/28/2013  . Vitamin D deficiency 04/28/2013  . Lumbar spondylosis with myelopathy 04/28/2013  . Osteoarthritis 04/28/2013  . Hypertension 04/28/2013  . Incontinence of urine 04/28/2013  . Hypothyroidism 04/28/2013  . Cough 08/21/2011  . Abnormal CXR 08/21/2011   Outpatient Encounter Prescriptions as of 08/15/2017  Medication Sig  . albuterol (PROVENTIL HFA;VENTOLIN HFA) 108 (90 Base) MCG/ACT inhaler Inhale 2 puffs into the lungs every 6 (six) hours as needed for wheezing or shortness of breath.  Marland Kitchen atenolol (TENORMIN) 50 MG tablet TAKE 1 TABLET (50 MG TOTAL) BY MOUTH DAILY.  Marland Kitchen BABY ASPIRIN PO Take 1 tablet by mouth daily.  . Cholecalciferol (VITAMIN D) 2000 units CAPS Take by mouth.  . docusate sodium (COLACE) 100 MG capsule Take 100 mg by mouth daily as needed.   Marland Kitchen levothyroxine (SYNTHROID, LEVOTHROID) 150 MCG tablet TAKE 1 TABLET (150 MCG TOTAL) BY MOUTH DAILY.  . Multiple Vitamins-Minerals (CENTRUM SILVER PO) Take 1 tablet by mouth daily.  . nabumetone (RELAFEN) 500 MG tablet Take 2 tablets by mouth daily as needed.  Marland Kitchen Propylene Glycol (SYSTANE BALANCE OP) Apply 1 drop to eye 2 (two) times daily as needed (Dry eyes). Reported on 01/31/2016  . rosuvastatin (CRESTOR) 40 MG  tablet TAKE 1 TABLET BY MOUTH AS DIRECTED  . fluticasone furoate-vilanterol (BREO ELLIPTA) 200-25 MCG/INH AEPB Inhale 1 puff into the lungs daily. (Patient not taking: Reported on 08/15/2017)  . [DISCONTINUED] rosuvastatin (CRESTOR) 20 MG tablet Take 20 mg by mouth daily.   Facility-Administered Encounter Medications as of 08/15/2017  Medication  . cyanocobalamin ((VITAMIN B-12)) injection 1,000 mcg     Review of Systems  Constitutional: Negative.   HENT: Negative.   Eyes: Negative.   Respiratory: Negative.   Cardiovascular: Negative.     Gastrointestinal: Negative.   Endocrine: Negative.   Genitourinary: Negative.   Musculoskeletal: Negative.   Skin: Negative.   Allergic/Immunologic: Negative.   Neurological: Negative.   Hematological: Negative.   Psychiatric/Behavioral: Negative.        Objective:   Physical Exam  Constitutional: She is oriented to person, place, and time. She appears well-developed and well-nourished. No distress.  Patient is pleasant and calm and intent on maintaining good health care.  HENT:  Head: Normocephalic and atraumatic.  Right Ear: External ear normal.  Left Ear: External ear normal.  Nose: Nose normal.  Mouth/Throat: Oropharynx is clear and moist.  Eyes: Pupils are equal, round, and reactive to light. Conjunctivae and EOM are normal. Right eye exhibits no discharge. Left eye exhibits no discharge. No scleral icterus.  He will continue to follow-up with the ophthalmologist as planned  Neck: Normal range of motion. Neck supple. No thyromegaly present.  No bruits thyromegaly or anterior cervical adenopathy  Cardiovascular: Normal rate, regular rhythm, normal heart sounds and intact distal pulses.   No murmur heard. Heart has a regular rate and rhythm at 60/min.  Pulmonary/Chest: Effort normal and breath sounds normal. No respiratory distress. She has no wheezes. She has no rales.  Clear anteriorly and posteriorly  Abdominal: Soft. Bowel sounds are normal. She exhibits no mass. There is no tenderness. There is no rebound and no guarding.  No liver or spleen enlargement.  No epigastric tenderness.  No masses.  No bruits.  Musculoskeletal: She exhibits no edema or tenderness.  Range of motion is hesitant especially because of arthritis in the back and stiffness in the left knee.  Lymphadenopathy:    She has no cervical adenopathy.  Neurological: She is alert and oriented to person, place, and time. She has normal reflexes. No cranial nerve deficit.  Skin: Skin is warm and dry. No rash  noted.  Psychiatric: She has a normal mood and affect. Her behavior is normal. Judgment and thought content normal.  Nursing note and vitals reviewed.  BP 135/83 (BP Location: Left Arm)   Pulse (!) 48   Temp (!) 96.8 F (36 C) (Oral)   Ht 5' 3.5" (1.613 m)   Wt 205 lb (93 kg)   LMP 04/29/1991   BMI 35.74 kg/m         Assessment & Plan:  1. Essential hypertension, benign -Blood pressure is good today and she will continue with current treatment - CBC with Differential/Platelet - BMP8+EGFR - Hepatic function panel  2. Vitamin D deficiency -Continue with current treatment pending the results of lab work - CBC with Differential/Platelet - VITAMIN D 25 Hydroxy (Vit-D Deficiency, Fractures)  3. Other specified hypothyroidism -Continue with current treatment pending results of lab work - CBC with Differential/Platelet - Thyroid Panel With TSH  4. Metabolic syndrome -Continue with all efforts of losing weight through diet and exercise - CBC with Differential/Platelet  5. Pure hypercholesterolemia -Continue with current treatment and aggressive therapeutic lifestyle  changes pending results of lab work - CBC with Differential/Platelet - Lipid panel  6. Vitamin B 12 deficiency -She has not been taking her B12 shots as regularly as she should have and she told me this during the visit today she will get back on a regular schedule of B12 shots, but we will check a level today. - CBC with Differential/Platelet - Vitamin B12  7.  Constipation -Trial of Linzess 1 daily  Meds ordered this encounter  Medications  . levothyroxine (SYNTHROID, LEVOTHROID) 150 MCG tablet    Sig: TAKE 1 TABLET (150 MCG TOTAL) BY MOUTH DAILY.    Dispense:  90 tablet    Refill:  1   Patient Instructions                       Medicare Annual Wellness Visit  [facility] and the medical providers at [facility] strive to bring you the best medical care.  In doing so we not  only want to address your current medical conditions and concerns but also to detect new conditions early and prevent illness, disease and health-related problems.    Medicare offers a yearly Wellness Visit which allows our clinical staff to assess your need for preventative services including immunizations, lifestyle education, counseling to decrease risk of preventable diseases and screening for fall risk and other medical concerns.    This visit is provided free of charge (no copay) for all Medicare recipients. The clinical pharmacists at [facility] have begun to conduct these Wellness Visits which will also include a thorough review of all your medications.    As you primary medical provider recommend that you make an appointment for your Annual Wellness Visit if you have not done so already this year.  You may set up this appointment before you leave today or you may call back ([phone]) and schedule an appointment.  Please make sure when you call that you mention that you are scheduling your Annual Wellness Visit with the clinical pharmacist so that the appointment may be made for the proper length of time.     Continue current medications. Continue good therapeutic lifestyle changes which include good diet and exercise. Fall precautions discussed with patient. If an FOBT was given today- please return it to our front desk. If you are over 56 years old - you may need Prevnar 3 or the adult Pneumonia vaccine.  **Flu shots are available--- please call and schedule a FLU-CLINIC appointment**  After your visit with [place] today you will receive a survey in the mail or online from [employer] regarding your care with [place]. Please take a moment to fill this out. Your feedback is very important to [place] as you can help [place] better understand your patient needs as well as improve your experience and satisfaction. WE CARE ABOUT YOU!!!    Do not forget to get your mammogram  done Continue to follow-up with ophthalmology and dentist Return the FOBT We will call with lab work results as soon as those results become available Please check with your insurance regarding the new shingles shot with which 2 doses are required. Try Linzess, 1 daily for constipation.  This may can be reduced to 1 3 times a week or less.  Try the samples and if you like this better than what you are currently doing for constipation call us back and we will call some in for you. Check with your insurance regarding the  new shingles shot Do not forget to schedule your mammogram   Arrie Senate MD

## 2017-08-15 NOTE — Patient Instructions (Addendum)
Medicare Annual Wellness Visit  [facility] and the medical providers at [facility] strive to bring you the best medical care.  In doing so we not only want to address your current medical conditions and concerns but also to detect new conditions early and prevent illness, disease and health-related problems.    Medicare offers a yearly Wellness Visit which allows our clinical staff to assess your need for preventative services including immunizations, lifestyle education, counseling to decrease risk of preventable diseases and screening for fall risk and other medical concerns.    This visit is provided free of charge (no copay) for all Medicare recipients. The clinical pharmacists at [facility] have begun to conduct these Wellness Visits which will also include a thorough review of all your medications.    As you primary medical provider recommend that you make an appointment for your Annual Wellness Visit if you have not done so already this year.  You may set up this appointment before you leave today or you may call back ([phone]) and schedule an appointment.  Please make sure when you call that you mention that you are scheduling your Annual Wellness Visit with the clinical pharmacist so that the appointment may be made for the proper length of time.     Continue current medications. Continue good therapeutic lifestyle changes which include good diet and exercise. Fall precautions discussed with patient. If an FOBT was given today- please return it to our front desk. If you are over 40 years old - you may need Prevnar 53 or the adult Pneumonia vaccine.  **Flu shots are available--- please call and schedule a FLU-CLINIC appointment**  After your visit with [place] today you will receive a survey in the mail or online from [employer] regarding your care with [place]. Please take a moment to fill this out. Your feedback is very  important to [place] as you can help [place] better understand your patient needs as well as improve your experience and satisfaction. WE CARE ABOUT YOU!!!    Do not forget to get your mammogram done Continue to follow-up with ophthalmology and dentist Return the FOBT We will call with lab work results as soon as those results become available Please check with your insurance regarding the new shingles shot with which 2 doses are required. Try Linzess, 1 daily for constipation.  This may can be reduced to 1 3 times a week or less.  Try the samples and if you like this better than what you are currently doing for constipation call us back and we will call some in for you. Check with your insurance regarding the new shingles shot Do not forget to schedule your mammogram

## 2017-08-16 LAB — CBC WITH DIFFERENTIAL/PLATELET
BASOS: 0 %
Basophils Absolute: 0 10*3/uL (ref 0.0–0.2)
EOS (ABSOLUTE): 0.2 10*3/uL (ref 0.0–0.4)
Eos: 5 %
Hematocrit: 38.5 % (ref 34.0–46.6)
Hemoglobin: 12.4 g/dL (ref 11.1–15.9)
Immature Grans (Abs): 0 10*3/uL (ref 0.0–0.1)
Immature Granulocytes: 0 %
LYMPHS ABS: 1.1 10*3/uL (ref 0.7–3.1)
Lymphs: 30 %
MCH: 26.8 pg (ref 26.6–33.0)
MCHC: 32.2 g/dL (ref 31.5–35.7)
MCV: 83 fL (ref 79–97)
MONOS ABS: 0.5 10*3/uL (ref 0.1–0.9)
Monocytes: 15 %
NEUTROS ABS: 1.8 10*3/uL (ref 1.4–7.0)
Neutrophils: 50 %
PLATELETS: 179 10*3/uL (ref 150–379)
RBC: 4.62 x10E6/uL (ref 3.77–5.28)
RDW: 16.3 % — AB (ref 12.3–15.4)
WBC: 3.6 10*3/uL (ref 3.4–10.8)

## 2017-08-16 LAB — THYROID PANEL WITH TSH
FREE THYROXINE INDEX: 1.7 (ref 1.2–4.9)
T3 UPTAKE RATIO: 23 % — AB (ref 24–39)
T4, Total: 7.3 ug/dL (ref 4.5–12.0)
TSH: 0.535 u[IU]/mL (ref 0.450–4.500)

## 2017-08-16 LAB — BMP8+EGFR
BUN / CREAT RATIO: 16 (ref 12–28)
BUN: 14 mg/dL (ref 8–27)
CO2: 20 mmol/L (ref 20–29)
Calcium: 9.3 mg/dL (ref 8.7–10.3)
Chloride: 106 mmol/L (ref 96–106)
Creatinine, Ser: 0.88 mg/dL (ref 0.57–1.00)
GFR calc non Af Amer: 68 mL/min/{1.73_m2} (ref >59)
GFR, EST AFRICAN AMERICAN: 78 mL/min/{1.73_m2} (ref >59)
Glucose: 92 mg/dL (ref 65–99)
POTASSIUM: 4.4 mmol/L (ref 3.5–5.2)
Sodium: 144 mmol/L (ref 134–144)

## 2017-08-16 LAB — VITAMIN D 25 HYDROXY (VIT D DEFICIENCY, FRACTURES): Vit D, 25-Hydroxy: 44.1 ng/mL (ref 30.0–100.0)

## 2017-08-16 LAB — HEPATIC FUNCTION PANEL
ALT: 14 IU/L (ref 0–32)
AST: 26 IU/L (ref 0–40)
Albumin: 4.4 g/dL (ref 3.6–4.8)
Alkaline Phosphatase: 102 IU/L (ref 39–117)
BILIRUBIN TOTAL: 0.4 mg/dL (ref 0.0–1.2)
Bilirubin, Direct: 0.11 mg/dL (ref 0.00–0.40)
Total Protein: 6.6 g/dL (ref 6.0–8.5)

## 2017-08-16 LAB — LIPID PANEL
Chol/HDL Ratio: 2.8 ratio (ref 0.0–4.4)
Cholesterol, Total: 165 mg/dL (ref 100–199)
HDL: 58 mg/dL (ref >39)
LDL Calculated: 87 mg/dL (ref 0–99)
Triglycerides: 100 mg/dL (ref 0–149)
VLDL Cholesterol Cal: 20 mg/dL (ref 5–40)

## 2017-08-16 LAB — VITAMIN B12: Vitamin B-12: 396 pg/mL (ref 232–1245)

## 2017-09-11 ENCOUNTER — Other Ambulatory Visit: Payer: Medicare Other

## 2017-09-11 ENCOUNTER — Ambulatory Visit (INDEPENDENT_AMBULATORY_CARE_PROVIDER_SITE_OTHER): Payer: Medicare Other | Admitting: *Deleted

## 2017-09-11 DIAGNOSIS — Z1211 Encounter for screening for malignant neoplasm of colon: Secondary | ICD-10-CM

## 2017-09-11 DIAGNOSIS — E538 Deficiency of other specified B group vitamins: Secondary | ICD-10-CM | POA: Diagnosis not present

## 2017-09-11 DIAGNOSIS — E559 Vitamin D deficiency, unspecified: Secondary | ICD-10-CM

## 2017-09-11 NOTE — Progress Notes (Signed)
Cyanocobalamin 1048mcg/ml given right arm. Pt tolerated well

## 2017-09-12 ENCOUNTER — Ambulatory Visit: Payer: Medicare Other

## 2017-09-12 LAB — FECAL OCCULT BLOOD, IMMUNOCHEMICAL: Fecal Occult Bld: NEGATIVE

## 2017-10-17 ENCOUNTER — Other Ambulatory Visit: Payer: Self-pay | Admitting: Family Medicine

## 2017-12-13 ENCOUNTER — Ambulatory Visit: Payer: Medicare Other | Admitting: Family Medicine

## 2017-12-13 ENCOUNTER — Encounter: Payer: Self-pay | Admitting: Family Medicine

## 2017-12-13 VITALS — BP 133/80 | HR 60 | Temp 97.0°F | Resp 20 | Ht 63.5 in | Wt 205.4 lb

## 2017-12-13 DIAGNOSIS — J209 Acute bronchitis, unspecified: Secondary | ICD-10-CM

## 2017-12-13 MED ORDER — BENZONATATE 200 MG PO CAPS: 200.0000 mg | ORAL_CAPSULE | Freq: Two times a day (BID) | ORAL | 0 refills | Status: DC | PRN

## 2017-12-13 MED ORDER — METHYLPREDNISOLONE ACETATE 80 MG/ML IJ SUSP
80.0000 mg | Freq: Once | INTRAMUSCULAR | Status: AC
  Administered 2017-12-13: 80 mg via INTRAMUSCULAR

## 2017-12-13 NOTE — Patient Instructions (Addendum)
As we discussed, continue the [facility] daily.  For the next 48 hours, use the albuterol every 6 hours.  Then use only if needed as directed.  I have prescribed an oral cough suppressant to use twice daily if needed for coughing.  It does not contain codeine and should be well tolerated.  If you develop any of the worrisome symptoms we discussed, please return for reevaluation.  It appears that you have a viral upper respiratory infection (cold) that is causing bronchospasm and inflammation.  Cold symptoms can last up to 2 weeks.    - Get plenty of rest and drink plenty of fluids. - Try to breathe moist air. Use a cold mist humidifier. - Consume warm fluids (soup or tea) to provide relief for a stuffy nose and to loosen phlegm. - For nasal stuffiness, try saline nasal spray or a Neti Pot. Afrin nasal spray can also be used but this product should not be used longer than 3 days or it will cause rebound nasal stuffiness (worsening nasal congestion). - For sore throat pain relief: suck on throat lozenges, hard candy or popsicles; gargle with warm salt water (1/4 tsp. salt per 8 oz. of water); and eat soft, bland foods. - Eat a well-balanced diet. If you cannot, ensure you are getting enough nutrients by taking a daily multivitamin. - Avoid dairy products, as they can thicken phlegm. - Avoid alcohol, as it impairs your body's immune system.  CONTACT YOUR DOCTOR IF YOU EXPERIENCE ANY OF THE FOLLOWING: - High fever - Ear pain - Sinus-type headache - Unusually severe cold symptoms - Cough that gets worse while other cold symptoms improve - Flare up of any chronic lung problem, such as asthma - Your symptoms persist longer than 2 weeks

## 2017-12-13 NOTE — Progress Notes (Signed)
Subjective: CC: URI PCP: Chipper Herb, MD JJO:ACZYSAY Miranda Wilson is a 67 y.o. female presenting to clinic today for:  1. Cold symptoms  Patient reports productive cough, rhinorrhea that started Monday and has worsened over the last 2 days. She reports 1 episode of post tussive vomiting. She reports multiple sick contacts including her grandson who had influenza recently.  Denies hemoptysis, sinus pressure, headache, SOB, dizziness, rash, nausea, diarrhea, fevers, chills, myalgia, recent travel.  Patient has used Breo, Albuterol with some relief of symptoms.  Denies history of COPD or asthma but notes that she has been having similar episodes of the last year, seemingly associated with season changes.  She notes that this is why she has the inhalers and uses them for flares.  No current tobacco use/ exposure.  ROS: Per HPI  Allergies  Allergen Reactions  . Clindamycin/Lincomycin Other (See Comments)    Dizzy spells  . Codeine Nausea And Vomiting  . Crestor [Rosuvastatin Calcium] Other (See Comments)    Aching flu like large dose   . Lipitor [Atorvastatin Calcium] Other (See Comments)    Numbness in feet   . Pravachol Other (See Comments)    Back pain    . Simvastatin Other (See Comments)    Back pain    . Ultram [Tramadol Hcl] Nausea And Vomiting  . Vioxx [Rofecoxib] Other (See Comments)    Fluid rentention , elevated blood pressure   . Feldene [Piroxicam] Rash  . Myrbetriq [Mirabegron] Other (See Comments)    Constipation, headache  . Penicillins Rash    Pt states she CAN take Keflex  . Toviaz [Fesoterodine Fumarate Er] Other (See Comments)    Dry skin, dry eyes, constipation   Past Medical History:  Diagnosis Date  . At risk for sleep apnea   . Bilateral edema of lower extremity    left > right --  wear compression hose  . Cervical spondylosis   . Chronic constipation   . Cystitis 3016-0109'N   Dr Prince Rome - here   . GERD (gastroesophageal reflux disease)   . History  of colon polyps   . History of idiopathic seizure    AGE 44 to 20  X5  --  UNKNOW IDIOLOGY , PER PT WAS ANEMIC AT THE TIME--  NONE SINCE  . History of tachycardia    S/P  RADIOACTIVE IODINE ABLATION OF THYROID  . Hyperlipidemia   . Hypertension   . Hypothyroidism, postradioiodine therapy    AGE 1  . IBS (irritable bowel syndrome)   . OA (osteoarthritis)    shoulders  left > right  . PMB (postmenopausal bleeding)   . S/P radioactive iodine thyroid ablation   . Urge urinary incontinence   . Varicose veins   . Vein disorder    LEFT ANKLE VEIN VALVE REFLUX WITH DECREASED CIRCULATION     Current Outpatient Medications:  .  albuterol (PROVENTIL HFA;VENTOLIN HFA) 108 (90 Base) MCG/ACT inhaler, Inhale 2 puffs into the lungs every 6 (six) hours as needed for wheezing or shortness of breath., Disp: 1 Inhaler, Rfl: 5 .  atenolol (TENORMIN) 50 MG tablet, TAKE 1 TABLET (50 MG TOTAL) BY MOUTH DAILY., Disp: 90 tablet, Rfl: 3 .  BABY ASPIRIN PO, Take 1 tablet by mouth daily., Disp: , Rfl:  .  Cholecalciferol (VITAMIN D) 2000 units CAPS, Take by mouth., Disp: , Rfl:  .  docusate sodium (COLACE) 100 MG capsule, Take 100 mg by mouth daily as needed. , Disp: , Rfl:  .  fluticasone furoate-vilanterol (BREO ELLIPTA) 200-25 MCG/INH AEPB, Inhale 1 puff into the lungs daily., Disp: 1 each, Rfl: 11 .  levothyroxine (SYNTHROID, LEVOTHROID) 150 MCG tablet, TAKE 1 TABLET (150 MCG TOTAL) BY MOUTH DAILY., Disp: 90 tablet, Rfl: 1 .  Multiple Vitamins-Minerals (CENTRUM SILVER PO), Take 1 tablet by mouth daily., Disp: , Rfl:  .  nabumetone (RELAFEN) 500 MG tablet, Take 2 tablets by mouth daily as needed., Disp: , Rfl: 1 .  Propylene Glycol (SYSTANE BALANCE OP), Apply 1 drop to eye 2 (two) times daily as needed (Dry eyes). Reported on 01/31/2016, Disp: , Rfl:  .  rosuvastatin (CRESTOR) 40 MG tablet, TAKE 1 TABLET BY MOUTH AS DIRECTED, Disp: 30 tablet, Rfl: 0 .  rosuvastatin (CRESTOR) 40 MG tablet, TAKE 1 TABLET BY MOUTH  AS DIRECTED, Disp: 30 tablet, Rfl: 2  Current Facility-Administered Medications:  .  cyanocobalamin ((VITAMIN B-12)) injection 1,000 mcg, 1,000 mcg, Intramuscular, Q30 days, Chipper Herb, MD, 1,000 mcg at 09/11/17 1434 Social History   Socioeconomic History  . Marital status: Married    Spouse name: Not on file  . Number of children: 1  . Years of education: Not on file  . Highest education level: Not on file  Social Needs  . Financial resource strain: Not on file  . Food insecurity - worry: Not on file  . Food insecurity - inability: Not on file  . Transportation needs - medical: Not on file  . Transportation needs - non-medical: Not on file  Occupational History  . Occupation: Retired     Comment: Pharmacist, hospital  Tobacco Use  . Smoking status: Never Smoker  . Smokeless tobacco: Never Used  Substance and Sexual Activity  . Alcohol use: No  . Drug use: No  . Sexual activity: Yes    Birth control/protection: None    Comment: not much  Other Topics Concern  . Not on file  Social History Narrative   Lives with husband.     Family History  Problem Relation Age of Onset  . COPD Mother        smoked  . Parkinsonism Mother   . Arthritis Mother   . Epilepsy Mother        early in 30's  . Myasthenia gravis Father   . Asthma Father   . Emphysema Father        smoked  . Glaucoma Father   . Arthritis Brother        knees  . Heart disease Brother   . Diabetes Brother   . Hyperlipidemia Brother   . Hyperlipidemia Brother   . Hyperlipidemia Brother   . Heart disease Brother   . Hyperlipidemia Brother   . CAD Paternal Grandmother 43  . Epilepsy Daughter 44  . Kidney disease Maternal Uncle   . Colon cancer Neg Hx     Objective: Office vital signs reviewed. BP 133/80   Pulse 60   Temp (!) 97 F (36.1 C) (Oral)   Resp 20   Ht 5' 3.5" (1.613 m)   Wt 205 lb 6.4 oz (93.2 kg)   LMP 04/29/1991   SpO2 99%   BMI 35.81 kg/m   Physical Examination:  General: Awake,  alert, well nourished, nontoxic appearing, No acute distress HEENT: Normal    Neck: No masses palpated. No lymphadenopathy    Ears: Tympanic membranes intact, normal light reflex, no erythema, no bulging    Eyes: PERRLA, extraocular membranes intact, sclera white    Nose: nasal turbinates moist,  clear nasal discharge    Throat: moist mucus membranes, no erythema, no tonsillar exudate.  Airway is patent Cardio: regular rate and rhythm, S1S2 heard, no murmurs appreciated Pulm: clear to auscultation bilaterally, no wheezes, rhonchi or rales; normal work of breathing on room air  Assessment/ Plan: 67 y.o. female   1. Bronchospasm with bronchitis, acute Sounds like she is having bronchospasm.  No evidence of bacterial infection on exam.  Likely has a viral URI causing bronchial irritation.  She was given a dose of IM Depo-Medrol here in office.  Tessalon Perles prescribed.  May continue Breo and recommended that she use the albuterol 2 puffs every 6 hours for the next 2 days then use as needed as directed.  Home care instructions reviewed.  Handout provided.  Patient will follow-up as needed. - methylPREDNISolone acetate (DEPO-MEDROL) injection 80 mg  Meds ordered this encounter  Medications  . benzonatate (TESSALON) 200 MG capsule    Sig: Take 1 capsule (200 mg total) by mouth 2 (two) times daily as needed for cough.    Dispense:  20 capsule    Refill:  0  . methylPREDNISolone acetate (DEPO-MEDROL) injection 80 mg     [name], DO [facility] [phone]

## 2018-01-02 ENCOUNTER — Ambulatory Visit: Payer: Medicare Other | Admitting: Family Medicine

## 2018-01-02 ENCOUNTER — Encounter: Payer: Self-pay | Admitting: Family Medicine

## 2018-01-02 VITALS — BP 144/83 | HR 44 | Temp 96.6°F | Ht 63.5 in | Wt 201.0 lb

## 2018-01-02 DIAGNOSIS — J329 Chronic sinusitis, unspecified: Secondary | ICD-10-CM | POA: Diagnosis not present

## 2018-01-02 DIAGNOSIS — J9801 Acute bronchospasm: Secondary | ICD-10-CM

## 2018-01-02 DIAGNOSIS — M4716 Other spondylosis with myelopathy, lumbar region: Secondary | ICD-10-CM | POA: Diagnosis not present

## 2018-01-02 DIAGNOSIS — E7800 Pure hypercholesterolemia, unspecified: Secondary | ICD-10-CM | POA: Diagnosis not present

## 2018-01-02 DIAGNOSIS — R05 Cough: Secondary | ICD-10-CM

## 2018-01-02 DIAGNOSIS — E538 Deficiency of other specified B group vitamins: Secondary | ICD-10-CM | POA: Diagnosis not present

## 2018-01-02 DIAGNOSIS — I10 Essential (primary) hypertension: Secondary | ICD-10-CM | POA: Diagnosis not present

## 2018-01-02 DIAGNOSIS — R053 Chronic cough: Secondary | ICD-10-CM

## 2018-01-02 DIAGNOSIS — E038 Other specified hypothyroidism: Secondary | ICD-10-CM | POA: Diagnosis not present

## 2018-01-02 DIAGNOSIS — E559 Vitamin D deficiency, unspecified: Secondary | ICD-10-CM

## 2018-01-02 DIAGNOSIS — J209 Acute bronchitis, unspecified: Secondary | ICD-10-CM

## 2018-01-02 DIAGNOSIS — E8881 Metabolic syndrome: Secondary | ICD-10-CM

## 2018-01-02 MED ORDER — CEFDINIR 300 MG PO CAPS: 300.0000 mg | ORAL_CAPSULE | Freq: Two times a day (BID) | ORAL | 0 refills | Status: DC

## 2018-01-02 MED ORDER — FLUCONAZOLE 200 MG PO TABS: ORAL_TABLET | ORAL | 0 refills | Status: DC

## 2018-01-02 NOTE — Progress Notes (Signed)
Subjective:    Patient ID: Miranda Wilson, female    DOB: [birth year], 67 y.o.   MRN: [id number]  HPI  Pt here for follow up and management of chronic medical problems which includes hypertension and hyperlipidemia. She is taking medication regularly.  The patient comes to the visit today for her regular visit.  She saw 1 of the other providers in the middle of February for upper respiratory infection and bronchitis and has a yeast infection from the antibiotics.  She also wants to discuss B12 orally versus injections.  Her initial blood pressure reading was elevated at 144/69.  The other concern was her pulse rate was 44.  She did have left eye surgery in January surgically lift the eyelid.  She also has had some dental implants.  The patient continues to have cough and congestion.  The sputum that she is coughing up is colored.  She also has had a yeast infection because of the antibiotics and inhalers she thinks.  She is doing well following the surgery and has some slight drainage from the left eye.  She is also doing well from the dental implant surgery done in December.  She is not doing well because of the persistent cough and congestion and brings up the thought of may be seeing a pulmonologist because this is been so drawn out for such a long period of time and we will comply with this but at the same time render treatment today to see if we can help her get better before she sees the pulmonologist.  She denies any chest pain or shortness of breath anymore than she would expect from the severe cough and congestion that she has had.  She is not had a chest x-ray.  She denies any nausea or vomiting or change in bowel habits other than some loose stools which she has attributed to having taken the [employer] she thinks.  She also denies any trouble with passing her water.    Patient Active Problem List   Diagnosis Date Noted  . History of arthroplasty of right knee 03/05/2016  . OA  (osteoarthritis) of knee 02/13/2016  . Leg edema 12/07/2015  . Preop cardiovascular exam 12/07/2015  . B12 deficiency 12/08/2014  . Metabolic syndrome 94/50/3888  . Bradycardia 05/31/2014  . Hyperlipemia 04/28/2013  . Vitamin D deficiency 04/28/2013  . Lumbar spondylosis with myelopathy 04/28/2013  . Osteoarthritis 04/28/2013  . Hypertension 04/28/2013  . Incontinence of urine 04/28/2013  . Hypothyroidism 04/28/2013  . Cough 08/21/2011  . Abnormal CXR 08/21/2011   Outpatient Encounter Medications as of 01/02/2018  Medication Sig  . albuterol (PROVENTIL HFA;VENTOLIN HFA) 108 (90 Base) MCG/ACT inhaler Inhale 2 puffs into the lungs every 6 (six) hours as needed for wheezing or shortness of breath.  Marland Kitchen atenolol (TENORMIN) 50 MG tablet TAKE 1 TABLET (50 MG TOTAL) BY MOUTH DAILY.  Marland Kitchen BABY ASPIRIN PO Take 1 tablet by mouth daily.  . Cholecalciferol (VITAMIN D) 2000 units CAPS Take by mouth.  . docusate sodium (COLACE) 100 MG capsule Take 100 mg by mouth daily as needed.   . fluticasone furoate-vilanterol (BREO ELLIPTA) 200-25 MCG/INH AEPB Inhale 1 puff into the lungs daily.  Marland Kitchen levothyroxine (SYNTHROID, LEVOTHROID) 150 MCG tablet TAKE 1 TABLET (150 MCG TOTAL) BY MOUTH DAILY.  . Multiple Vitamins-Minerals (CENTRUM SILVER PO) Take 1 tablet by mouth daily.  . nabumetone (RELAFEN) 500 MG tablet Take 2 tablets by mouth daily as needed.  Marland Kitchen Propylene Glycol (SYSTANE BALANCE  OP) Apply 1 drop to eye 2 (two) times daily as needed (Dry eyes). Reported on 01/31/2016  . rosuvastatin (CRESTOR) 40 MG tablet TAKE 1 TABLET BY MOUTH AS DIRECTED  . [DISCONTINUED] benzonatate (TESSALON) 200 MG capsule Take 1 capsule (200 mg total) by mouth 2 (two) times daily as needed for cough.  . [DISCONTINUED] rosuvastatin (CRESTOR) 40 MG tablet TAKE 1 TABLET BY MOUTH AS DIRECTED   Facility-Administered Encounter Medications as of 01/02/2018  Medication  . cyanocobalamin ((VITAMIN B-12)) injection 1,000 mcg     Review of  Systems  Constitutional: Negative for fever.  HENT: Positive for congestion (white and green (some blood)) and postnasal drip. Negative for sore throat.   Eyes: Positive for discharge (watery).  Respiratory: Positive for cough (eating and talking makes it worse).   Cardiovascular: Negative.   Gastrointestinal: Negative.   Endocrine: Negative.   Genitourinary: Negative.        Yeast infection   Musculoskeletal: Negative.   Skin: Negative.   Allergic/Immunologic: Negative.   Neurological: Negative.  Negative for headaches.  Hematological: Negative.   Psychiatric/Behavioral: Negative.        Objective:   Physical Exam  Constitutional: She is oriented to person, place, and time. She appears well-developed and well-nourished. She appears distressed.  HENT:  Head: Normocephalic and atraumatic.  Right Ear: External ear normal.  Left Ear: External ear normal.  Mouth/Throat: Oropharynx is clear and moist.  Purulent drainage left posterior nostril area.  The  Eyes: Conjunctivae and EOM are normal. Pupils are equal, round, and reactive to light. Right eye exhibits no discharge. Left eye exhibits no discharge. No scleral icterus.  Neck: Normal range of motion. Neck supple. No thyromegaly present.  Anterior cervical tenderness without adenopathy palpable  Cardiovascular: Normal rate, regular rhythm, normal heart sounds and intact distal pulses.  No murmur heard. The heart is regular at 52-60/min  Pulmonary/Chest: Effort normal and breath sounds normal. No respiratory distress. She has no wheezes. She has no rales.  No wheezes rales with only a dry cough  Abdominal: Soft. Bowel sounds are normal. She exhibits no mass. There is no tenderness. There is no rebound and no guarding.  Musculoskeletal: She exhibits no edema or tenderness.  Limited range of motion due to arthritis in the back and knee surgeries.  Lymphadenopathy:    She has no cervical adenopathy.  Neurological: She is alert and  oriented to person, place, and time. She has normal reflexes. No cranial nerve deficit.  Skin: Skin is warm and dry. No rash noted.  Psychiatric: She has a normal mood and affect. Her behavior is normal. Judgment and thought content normal.  Nursing note and vitals reviewed.   BP (!) 144/69 (BP Location: Right Arm)   Pulse (!) 44   Temp (!) 96.6 F (35.9 C) (Oral)   Ht 5' 3.5" (1.613 m)   Wt 201 lb (91.2 kg)   LMP 04/29/1991   BMI 35.05 kg/m   Repeat blood pressure right arm sitting with a large cuff 144/83      Assessment & Plan:  1. Pure hypercholesterolemia -Continue with current treatment pending results of lab work - CBC with Differential/Platelet - Lipid panel  2. Vitamin D deficiency -Continue with current treatment pending results of lab work - CBC with Differential/Platelet - VITAMIN D 25 Hydroxy (Vit-D Deficiency, Fractures)  3. Essential hypertension, benign -Continue with current treatment but blood pressure readings were elevated on 2 occasions today and patient will check readings at home and bring  these back for review in a couple of weeks. - CBC with Differential/Platelet - BMP8+EGFR - Hepatic function panel  4. Other specified hypothyroidism -Continue current treatment pending results of lab work - CBC with Differential/Platelet - Thyroid Panel With TSH  5. Metabolic syndrome -Continue with weight loss through diet and exercise as much as possible - CBC with Differential/Platelet - BMP8+EGFR  6. Vitamin B 12 deficiency -Continue with injectable B12 versus oral B12 - CBC with Differential/Platelet  7. Chronic cough -Take Mucinex maximum strength, blue and white in color, 1 twice daily with a large glass of water -Use albuterol inhaler 3-4 times daily -Restart Brio inhaler 1 puff once daily and rinse mouth after using - Ambulatory referral to Pulmonology  8. Lumbar spondylosis with myelopathy but  9. Morbid obesity ([place]) -Continue to work on  weight with diet and exercise  10. Bronchospasm -Use Brio inhaler and albuterol inhaler as directed take Omnicef and use Mucinex  11. Bronchospasm with bronchitis, acute -  12. Rhinosinusitis -Omnicef and nasal saline  Meds ordered this encounter  Medications  . cefdinir (OMNICEF) 300 MG capsule    Sig: Take 1 capsule (300 mg total) by mouth 2 (two) times daily. 1 po BID    Dispense:  20 capsule    Refill:  0  . fluconazole (DIFLUCAN) 200 MG tablet    Sig: Take 1 daily for 2 days,wait a week then repeat    Dispense:  4 tablet    Refill:  0   Patient Instructions                       Medicare Annual Wellness Visit  [facility] and the medical providers at [facility] strive to bring you the best medical care.  In doing so we not only want to address your current medical conditions and concerns but also to detect new conditions early and prevent illness, disease and health-related problems.    Medicare offers a yearly Wellness Visit which allows our clinical staff to assess your need for preventative services including immunizations, lifestyle education, counseling to decrease risk of preventable diseases and screening for fall risk and other medical concerns.    This visit is provided free of charge (no copay) for all Medicare recipients. The clinical pharmacists at [facility] have begun to conduct these Wellness Visits which will also include a thorough review of all your medications.    As you primary medical provider recommend that you make an appointment for your Annual Wellness Visit if you have not done so already this year.  You may set up this appointment before you leave today or you may call back ([phone]) and schedule an appointment.  Please make sure when you call that you mention that you are scheduling your Annual Wellness Visit with the clinical pharmacist so that the appointment may be made for the proper length of time.       Continue current medications. Continue good therapeutic lifestyle changes which include good diet and exercise. Fall precautions discussed with patient. If an FOBT was given today- please return it to our front desk. If you are over 53 years old - you may need Prevnar 55 or the adult Pneumonia vaccine.  **Flu shots are available--- please call and schedule a FLU-CLINIC appointment**  After your visit with [place] today you will receive a survey in the mail or online from [employer] regarding your care with [place]. Please take a  moment to fill this out. Your feedback is very important to [place] as you can help [place] better understand your patient needs as well as improve your experience and satisfaction. WE CARE ABOUT YOU!!!   We will do a referral to a pulmonology specialist and hopefully get this appointment in the next couple weeks. The patient should go back on Mucinex maximum strength, blue and white in color, plain and take 1 twice daily with a large glass of water She should restart the Brio inhaler and do 1 puff once daily and rinse mouth after using She should go back on her albuterol inhaler and use this more regularly 3 or 4 times daily She should continue with a coolmist humidifier. She should continue to monitor blood pressure readings at home. She should take the Diflucan 200 mg 1 daily for 2 days and repeat this dosage in 1 week. Take Omnicef 300 mg twice daily for 10 days with food     Arrie Senate MD

## 2018-01-02 NOTE — Patient Instructions (Addendum)
Medicare Annual Wellness Visit  [facility] and the medical providers at [facility] strive to bring you the best medical care.  In doing so we not only want to address your current medical conditions and concerns but also to detect new conditions early and prevent illness, disease and health-related problems.    Medicare offers a yearly Wellness Visit which allows our clinical staff to assess your need for preventative services including immunizations, lifestyle education, counseling to decrease risk of preventable diseases and screening for fall risk and other medical concerns.    This visit is provided free of charge (no copay) for all Medicare recipients. The clinical pharmacists at [facility] have begun to conduct these Wellness Visits which will also include a thorough review of all your medications.    As you primary medical provider recommend that you make an appointment for your Annual Wellness Visit if you have not done so already this year.  You may set up this appointment before you leave today or you may call back ([phone]) and schedule an appointment.  Please make sure when you call that you mention that you are scheduling your Annual Wellness Visit with the clinical pharmacist so that the appointment may be made for the proper length of time.     Continue current medications. Continue good therapeutic lifestyle changes which include good diet and exercise. Fall precautions discussed with patient. If an FOBT was given today- please return it to our front desk. If you are over 72 years old - you may need Prevnar 68 or the adult Pneumonia vaccine.  **Flu shots are available--- please call and schedule a FLU-CLINIC appointment**  After your visit with [place] today you will receive a survey in the mail or online from [employer] regarding your care with [place]. Please take a moment to fill this out. Your feedback is very  important to [place] as you can help [place] better understand your patient needs as well as improve your experience and satisfaction. WE CARE ABOUT YOU!!!   We will do a referral to a pulmonology specialist and hopefully get this appointment in the next couple weeks. The patient should go back on Mucinex maximum strength, blue and white in color, plain and take 1 twice daily with a large glass of water She should restart the Brio inhaler and do 1 puff once daily and rinse mouth after using She should go back on her albuterol inhaler and use this more regularly 3 or 4 times daily She should continue with a coolmist humidifier. She should continue to monitor blood pressure readings at home. She should take the Diflucan 200 mg 1 daily for 2 days and repeat this dosage in 1 week. Take Omnicef 300 mg twice daily for 10 days with food

## 2018-01-03 LAB — CBC WITH DIFFERENTIAL/PLATELET
BASOS ABS: 0 10*3/uL (ref 0.0–0.2)
Basos: 0 %
EOS (ABSOLUTE): 0.2 10*3/uL (ref 0.0–0.4)
Eos: 3 %
Hematocrit: 41.2 % (ref 34.0–46.6)
Hemoglobin: 13.5 g/dL (ref 11.1–15.9)
IMMATURE GRANULOCYTES: 1 %
Immature Grans (Abs): 0 10*3/uL (ref 0.0–0.1)
LYMPHS ABS: 0.9 10*3/uL (ref 0.7–3.1)
Lymphs: 13 %
MCH: 28.1 pg (ref 26.6–33.0)
MCHC: 32.8 g/dL (ref 31.5–35.7)
MCV: 86 fL (ref 79–97)
MONOCYTES: 12 %
MONOS ABS: 0.8 10*3/uL (ref 0.1–0.9)
NEUTROS PCT: 71 %
Neutrophils Absolute: 4.6 10*3/uL (ref 1.4–7.0)
Platelets: 217 10*3/uL (ref 150–379)
RBC: 4.81 x10E6/uL (ref 3.77–5.28)
RDW: 16 % — AB (ref 12.3–15.4)
WBC: 6.5 10*3/uL (ref 3.4–10.8)

## 2018-01-03 LAB — LIPID PANEL
CHOL/HDL RATIO: 2.7 ratio (ref 0.0–4.4)
CHOLESTEROL TOTAL: 159 mg/dL (ref 100–199)
HDL: 59 mg/dL (ref >39)
LDL CALC: 83 mg/dL (ref 0–99)
Triglycerides: 87 mg/dL (ref 0–149)
VLDL CHOLESTEROL CAL: 17 mg/dL (ref 5–40)

## 2018-01-03 LAB — HEPATIC FUNCTION PANEL
ALT: 17 IU/L (ref 0–32)
AST: 24 IU/L (ref 0–40)
Albumin: 4.3 g/dL (ref 3.6–4.8)
Alkaline Phosphatase: 121 IU/L — ABNORMAL HIGH (ref 39–117)
BILIRUBIN, DIRECT: 0.14 mg/dL (ref 0.00–0.40)
Bilirubin Total: 0.4 mg/dL (ref 0.0–1.2)
TOTAL PROTEIN: 6.5 g/dL (ref 6.0–8.5)

## 2018-01-03 LAB — BMP8+EGFR
BUN / CREAT RATIO: 13 (ref 12–28)
BUN: 13 mg/dL (ref 8–27)
CO2: 23 mmol/L (ref 20–29)
CREATININE: 0.97 mg/dL (ref 0.57–1.00)
Calcium: 9.5 mg/dL (ref 8.7–10.3)
Chloride: 102 mmol/L (ref 96–106)
GFR, EST AFRICAN AMERICAN: 69 mL/min/{1.73_m2} (ref >59)
GFR, EST NON AFRICAN AMERICAN: 60 mL/min/{1.73_m2} (ref >59)
GLUCOSE: 75 mg/dL (ref 65–99)
Potassium: 4.6 mmol/L (ref 3.5–5.2)
SODIUM: 140 mmol/L (ref 134–144)

## 2018-01-03 LAB — THYROID PANEL WITH TSH
FREE THYROXINE INDEX: 2.5 (ref 1.2–4.9)
T3 Uptake Ratio: 25 % (ref 24–39)
T4 TOTAL: 9.9 ug/dL (ref 4.5–12.0)
TSH: 0.807 u[IU]/mL (ref 0.450–4.500)

## 2018-01-03 LAB — VITAMIN D 25 HYDROXY (VIT D DEFICIENCY, FRACTURES): VIT D 25 HYDROXY: 48 ng/mL (ref 30.0–100.0)

## 2018-01-16 ENCOUNTER — Other Ambulatory Visit: Payer: Self-pay | Admitting: Family Medicine

## 2018-01-24 ENCOUNTER — Ambulatory Visit: Payer: Medicare Other | Admitting: *Deleted

## 2018-01-24 DIAGNOSIS — E538 Deficiency of other specified B group vitamins: Secondary | ICD-10-CM | POA: Diagnosis not present

## 2018-01-24 NOTE — Progress Notes (Signed)
Pt given cyanocobalamin inj Tolerated well 

## 2018-02-08 ENCOUNTER — Encounter: Payer: Self-pay | Admitting: Family Medicine

## 2018-02-24 ENCOUNTER — Ambulatory Visit: Payer: Medicare Other | Admitting: Family Medicine

## 2018-02-24 ENCOUNTER — Ambulatory Visit (INDEPENDENT_AMBULATORY_CARE_PROVIDER_SITE_OTHER): Payer: Medicare Other | Admitting: *Deleted

## 2018-02-24 VITALS — BP 122/67 | HR 52 | Temp 97.0°F | Ht 63.0 in | Wt 203.0 lb

## 2018-02-24 DIAGNOSIS — E538 Deficiency of other specified B group vitamins: Secondary | ICD-10-CM

## 2018-02-24 DIAGNOSIS — W57XXXA Bitten or stung by nonvenomous insect and other nonvenomous arthropods, initial encounter: Secondary | ICD-10-CM

## 2018-02-24 DIAGNOSIS — S70922A Unspecified superficial injury of left thigh, initial encounter: Secondary | ICD-10-CM | POA: Diagnosis not present

## 2018-02-24 MED ORDER — DOXYCYCLINE HYCLATE 100 MG PO TABS: 200.0000 mg | ORAL_TABLET | Freq: Once | ORAL | 0 refills | Status: AC

## 2018-02-24 NOTE — Progress Notes (Signed)
Subjective: CC: Tick bite PCP: Miranda Herb, MD [id number] Miranda Wilson is a 67 y.o. female presenting to clinic today for:  1. Tick bite Patient notes that she sustained a tick bite to the left inner thigh sometime over the weekend.  She notes that she felt what seemed like a skin tag on the left inner thigh on Saturday.  This was unable to be further looked at until her husband arrived home on Sunday.  She notes that he removed the tick from her thigh after it been present for at least 48 hours.  She brings to take into office today.  Denies fevers, chills, arthralgia, fatigue, target lesion rash.  She notes this is her first tick bite.   ROS: Per HPI  Allergies  Allergen Reactions  . Clindamycin/Lincomycin Other (See Comments)    Dizzy spells  . Codeine Nausea And Vomiting  . Crestor [Rosuvastatin Calcium] Other (See Comments)    Aching flu like large dose   . Lipitor [Atorvastatin Calcium] Other (See Comments)    Numbness in feet   . Pravachol Other (See Comments)    Back pain    . Simvastatin Other (See Comments)    Back pain    . Ultram [Tramadol Hcl] Nausea And Vomiting  . Vioxx [Rofecoxib] Other (See Comments)    Fluid rentention , elevated blood pressure   . Feldene [Piroxicam] Rash  . Myrbetriq [Mirabegron] Other (See Comments)    Constipation, headache  . Penicillins Rash    Pt states she CAN take Keflex  . Toviaz [Fesoterodine Fumarate Er] Other (See Comments)    Dry skin, dry eyes, constipation   Past Medical History:  Diagnosis Date  . At risk for sleep apnea   . Bilateral edema of lower extremity    left > right --  wear compression hose  . Cervical spondylosis   . Chronic constipation   . Cystitis [id number]'T   Dr Miranda Wilson - here   . GERD (gastroesophageal reflux disease)   . History of colon polyps   . History of idiopathic seizure    AGE 42 to 20  X5  --  UNKNOW IDIOLOGY , PER PT WAS ANEMIC AT THE TIME--  NONE SINCE  . History of tachycardia    S/P  RADIOACTIVE IODINE ABLATION OF THYROID  . Hyperlipidemia   . Hypertension   . Hypothyroidism, postradioiodine therapy    AGE 21  . IBS (irritable bowel syndrome)   . OA (osteoarthritis)    shoulders  left > right  . PMB (postmenopausal bleeding)   . S/P radioactive iodine thyroid ablation   . Urge urinary incontinence   . Varicose veins   . Vein disorder    LEFT ANKLE VEIN VALVE REFLUX WITH DECREASED CIRCULATION     Current Outpatient Medications:  .  albuterol (PROVENTIL HFA;VENTOLIN HFA) 108 (90 Base) MCG/ACT inhaler, Inhale 2 puffs into the lungs every 6 (six) hours as needed for wheezing or shortness of breath., Disp: 1 Inhaler, Rfl: 5 .  atenolol (TENORMIN) 50 MG tablet, TAKE 1 TABLET (50 MG TOTAL) BY MOUTH DAILY., Disp: 90 tablet, Rfl: 1 .  BABY ASPIRIN PO, Take 1 tablet by mouth daily., Disp: , Rfl:  .  Cholecalciferol (VITAMIN D) 2000 units CAPS, Take by mouth., Disp: , Rfl:  .  docusate sodium (COLACE) 100 MG capsule, Take 100 mg by mouth daily as needed. , Disp: , Rfl:  .  levothyroxine (SYNTHROID, LEVOTHROID) 150 MCG tablet, TAKE 1 TABLET (150  MCG TOTAL) BY MOUTH DAILY., Disp: 90 tablet, Rfl: 1 .  Multiple Vitamins-Minerals (CENTRUM SILVER PO), Take 1 tablet by mouth daily., Disp: , Rfl:  .  nabumetone (RELAFEN) 500 MG tablet, Take 2 tablets by mouth daily as needed., Disp: , Rfl: 1 .  Propylene Glycol (SYSTANE BALANCE OP), Apply 1 drop to eye 2 (two) times daily as needed (Dry eyes). Reported on 01/31/2016, Disp: , Rfl:  .  rosuvastatin (CRESTOR) 40 MG tablet, TAKE 1 TABLET BY MOUTH AS DIRECTED, Disp: 30 tablet, Rfl: 0  Current Facility-Administered Medications:  .  cyanocobalamin ((VITAMIN B-12)) injection 1,000 mcg, 1,000 mcg, Intramuscular, Q30 days, Miranda Herb, MD, 1,000 mcg at 02/24/18 [id number] Social History   Socioeconomic History  . Marital status: Married    Spouse name: Not on file  . Number of children: 1  . Years of education: Not on file  . Highest  education level: Not on file  Occupational History  . Occupation: Retired     Comment: Tour manager  . Financial resource strain: Not on file  . Food insecurity:    Worry: Not on file    Inability: Not on file  . Transportation needs:    Medical: Not on file    Non-medical: Not on file  Tobacco Use  . Smoking status: Never Smoker  . Smokeless tobacco: Never Used  Substance and Sexual Activity  . Alcohol use: No  . Drug use: No  . Sexual activity: Yes    Birth control/protection: None    Comment: not much  Lifestyle  . Physical activity:    Days per week: Not on file    Minutes per session: Not on file  . Stress: Not on file  Relationships  . Social connections:    Talks on phone: Not on file    Gets together: Not on file    Attends religious service: Not on file    Active member of club or organization: Not on file    Attends meetings of clubs or organizations: Not on file    Relationship status: Not on file  . Intimate partner violence:    Fear of current or ex partner: Not on file    Emotionally abused: Not on file    Physically abused: Not on file    Forced sexual activity: Not on file  Other Topics Concern  . Not on file  Social History Narrative   Lives with husband.     Family History  Problem Relation Age of Onset  . COPD Mother        smoked  . Parkinsonism Mother   . Arthritis Mother   . Epilepsy Mother        early in 69's  . Myasthenia gravis Father   . Asthma Father   . Emphysema Father        smoked  . Glaucoma Father   . Arthritis Brother        knees  . Heart disease Brother   . Diabetes Brother   . Hyperlipidemia Brother   . Hyperlipidemia Brother   . Hyperlipidemia Brother   . Heart disease Brother   . Hyperlipidemia Brother   . CAD Paternal Grandmother 78  . Epilepsy Daughter 61  . Kidney disease Maternal Uncle   . Colon cancer Neg Hx     Objective: Office vital signs reviewed. BP 122/67   Pulse (!) 52   Temp (!)  97 F (36.1 C) (Oral)   Ht  5\' 3"  (1.6 m)   Wt 203 lb (92.1 kg)   LMP 04/29/1991   BMI 35.96 kg/m   Physical Examination:  General: Awake, alert, well nourished, No acute distress Skin: Quarter size area of ecchymosis along the left inner thigh.  No induration, erythema or palpable fluctuance.  Tick that is presented to the office appears to be a deer tick.  Assessment/ Plan: 67 y.o. female   1. Tick bite, initial encounter Patient is well-appearing on today's exam.  No evidence of Lyme disease at this point.  Given duration of tick attachment, will empirically treat with Lyme prophylaxis.  She is well within the 72-hour time window for this.  Doxycycline 200 mg p.o. x1 prescribed.  Instructions for use discussed.  Home care instructions were reviewed.  Follow-up as needed.    Meds ordered this encounter  Medications  . doxycycline (VIBRA-TABS) 100 MG tablet    Sig: Take 2 tablets (200 mg total) by mouth once for 1 dose.    Dispense:  2 tablet    Refill:  [place], DO [place] [phone]

## 2018-02-24 NOTE — Progress Notes (Signed)
Pt given Cyanocobalamin inj Tolerated well 

## 2018-02-24 NOTE — Patient Instructions (Addendum)
He demonstrate no evidence of Lyme disease at this point.  I have prescribed you the prophylactic dose for Lyme disease.  This is doxycycline 200 mg taken as a single dose.  Make sure that you take this with a full meal and plenty of water.  Be aware that doxycycline can increase her light sensitivity and increase your risk for sunburn.  Make sure that you are wearing protective clothing and sunscreen.  Tick Bite Information, Adult Ticks are insects that can bite. Most ticks live in shrubs and grassy areas. They climb onto people and animals that go by. Then they bite. Some ticks carry germs that can make you sick. How can I prevent tick bites?  Use an insect repellent that has 20% or higher of the ingredients DEET, picaridin, or IR3535. Put this insect repellent on: ? Bare skin. ? The tops of your boots. ? Your pant legs. ? The ends of your sleeves.  If you use an insect repellent that has the ingredient permethrin, make sure to follow the instructions on the bottle. Treat the following: ? Clothing. ? Supplies. ? Boots. ? Tents.  Wear long sleeves, long pants, and light colors.  Tuck your pant legs into your socks.  Stay in the middle of the trail.  Try not to walk through long grass.  Before going inside your house, check your clothes, hair, and skin for ticks. Make sure to check your head, neck, armpits, waist, groin, and joint areas.  Check for ticks every day.  When you come indoors: ? Wash your clothes right away. ? Shower right away. ? Dry your clothes in a dryer on high heat for 60 minutes or more. What is the right way to remove a tick? Remove a tick from your skin as soon as possible.  To remove a tick that is crawling on your skin: ? Go outdoors and brush the tick off. ? Use tape or a lint roller.  To remove a tick that is biting: ? Wash your hands. ? If you have latex gloves, put them on. ? Use tweezers, curved forceps, or a tick-removal tool to grasp the tick.  Grasp the tick as close to your skin and as close to the tick's head as possible. ? Gently pull up until the tick lets go.  Try to keep the tick's head attached to its body.  Do not twist or jerk the tick.  Do not squeeze or crush the tick.  Do not try to remove a tick with heat, alcohol, petroleum jelly, or fingernail polish. How should I get rid of a tick? Here are some ways to get rid of a tick that is alive:  Place the tick in rubbing alcohol.  Place the tick in a bag or container you can close tightly.  Wrap the tick tightly in tape.  Flush the tick down the toilet.  Contact a doctor if:  You have symptoms of a disease, such as: ? Pain in a muscle, joint, or bone. ? Trouble walking or moving your legs. ? Numbness in your legs. ? Inability to move (paralysis). ? A red rash that makes a circle (bull's-eye rash). ? Redness and swelling where the tick bit you. ? A fever. ? Throwing up (vomiting) over and over. ? Diarrhea. ? Weight loss. ? Tender and swollen lymph glands. ? Shortness of breath. ? Cough. ? Belly pain (abdominal pain). ? Headache. ? Being more tired than normal. ? A change in how alert (conscious) you are. ?  Confusion. Get help right away if:  You cannot remove a tick.  A part of a tick breaks off and gets stuck in your skin.  You are feeling worse. Summary  Ticks may carry germs that can make you sick.  To prevent tick bites, wear long sleeves, long pants, and light colors. Use insect repellent. Follow the instructions on the bottle.  If the tick is biting, do not try to remove it with heat, alcohol, petroleum jelly, or fingernail polish.  Use tweezers, curved forceps, or a tick-removal tool to grasp the tick. Gently pull up until the tick lets go. Do not twist or jerk the tick. Do not squeeze or crush the tick.  If you have symptoms, contact a doctor. This information is not intended to replace advice given to you by your health care  provider. Make sure you discuss any questions you have with your health care provider. Document Released: 01/09/2010 Document Revised: 01/25/2017 Document Reviewed: 01/25/2017 Elsevier Interactive Patient Education  2018 [employer].  Lyme Disease Lyme disease is an infection that affects many parts of the body, including the skin, joints, and nervous system. It is a bacterial infection that starts from the bite of an infected tick. The infection can spread, and some of the symptoms are similar to the flu. If Lyme disease is not treated, it may cause joint pain, swelling, numbness, problems thinking, fatigue, muscle weakness, and other problems. What are the causes? This condition is caused by bacteria called Borrelia burgdorferi. You can get Lyme disease by being bitten by an infected tick. The tick must be attached to your skin to pass along the infection. Deer often carry infected ticks. What increases the risk? The following factors may make you more likely to develop this condition: Living in or visiting these areas in the [place]. [place]. The [place]. Spending time in wooded or grassy areas. Being outdoors with exposed skin. Camping, gardening, hiking, fishing, or hunting outdoors. Failing to remove a tick from your skin within 3-4 days.  What are the signs or symptoms? Symptoms of this condition include: A round, red rash that surrounds the center of the tick bite. This is the first sign of infection. The center of the rash may be blood colored or have tiny blisters. Fatigue. Headache. Chills and fever. General achiness. Joint pain, often in the knees. Muscle pain. Swollen lymph glands. Stiff neck.  How is this diagnosed? This condition is diagnosed based on: Your symptoms and medical history. A physical exam. A blood test.  How is this treated? The main treatment for this condition is antibiotic medicine, which is usually taken by  mouth (orally). The length of treatment depends on how soon after a tick bite you begin taking the medicine. In some cases, treatment is necessary for several weeks. If the infection is severe, antibiotics may need to be given through an IV tube that is inserted into one of your veins. Follow these instructions at home: Take your antibiotic medicine as told by your health care provider. Do not stop taking the antibiotic even if you start to feel better. Ask your health care provider about takinga probiotic in between doses of your antibiotic to help avoid stomach upset or diarrhea. Check with your health care provider before supplementing your treatment. Many alternative therapies have not been proven and may be harmful to you. Keep all follow-up visits as told by your health care provider. This is important. How is this prevented? You  can become reinfected if you get another tick bite from an infected tick. Take these steps to help prevent an infection: Cover your skin with light-colored clothing when you are outdoors in the spring and summer months. Spray clothing and skin with bug spray. The spray should be 20-30% DEET. Avoid wooded, grassy, and shaded areas. Remove yard litter, brush, trash, and plants that attract deer and rodents. Check yourself for ticks when you come indoors. Wash clothing worn each day. Check your pets for ticks before they come inside. If you find a tick: Remove it with tweezers. Clean your hands and the bite area with rubbing alcohol or soap and water.  Pregnant women should take special care to avoid tick bites because the infection can be passed along to the fetus. Contact a health care provider if: You have symptoms after treatment. You have removed a tick and want to bring it to your health care provider for testing. Get help right away if: You have an irregular heartbeat. You have nerve pain. Your face feels numb. This information is not intended to  replace advice given to you by your health care provider. Make sure you discuss any questions you have with your health care provider. Document Released: 01/21/2001 Document Revised: 06/05/2016 Document Reviewed: 06/05/2016 Elsevier Interactive Patient Education  2018 [employer].

## 2018-02-28 ENCOUNTER — Telehealth: Payer: Self-pay | Admitting: Family Medicine

## 2018-02-28 NOTE — Telephone Encounter (Signed)
May this pt have diflucan 150 sent in   [facility]

## 2018-03-07 ENCOUNTER — Institutional Professional Consult (permissible substitution): Payer: BC Managed Care – PPO | Admitting: Pulmonary Disease

## 2018-03-27 ENCOUNTER — Ambulatory Visit (INDEPENDENT_AMBULATORY_CARE_PROVIDER_SITE_OTHER): Payer: Medicare Other | Admitting: *Deleted

## 2018-03-27 DIAGNOSIS — E538 Deficiency of other specified B group vitamins: Secondary | ICD-10-CM

## 2018-03-27 NOTE — Progress Notes (Signed)
Pt given cyanocobalamin inj Tolerated well 

## 2018-04-11 ENCOUNTER — Other Ambulatory Visit: Payer: Self-pay | Admitting: Family Medicine

## 2018-05-11 ENCOUNTER — Other Ambulatory Visit: Payer: Self-pay | Admitting: Family Medicine

## 2018-05-22 ENCOUNTER — Ambulatory Visit: Payer: Medicare Other | Admitting: Family Medicine

## 2018-05-22 ENCOUNTER — Encounter: Payer: Self-pay | Admitting: Family Medicine

## 2018-05-22 VITALS — BP 135/64 | HR 44 | Temp 97.0°F | Ht 63.0 in | Wt 204.0 lb

## 2018-05-22 DIAGNOSIS — E7800 Pure hypercholesterolemia, unspecified: Secondary | ICD-10-CM

## 2018-05-22 DIAGNOSIS — M778 Other enthesopathies, not elsewhere classified: Secondary | ICD-10-CM

## 2018-05-22 DIAGNOSIS — E559 Vitamin D deficiency, unspecified: Secondary | ICD-10-CM | POA: Diagnosis not present

## 2018-05-22 DIAGNOSIS — I10 Essential (primary) hypertension: Secondary | ICD-10-CM | POA: Diagnosis not present

## 2018-05-22 DIAGNOSIS — E8881 Metabolic syndrome: Secondary | ICD-10-CM

## 2018-05-22 DIAGNOSIS — I872 Venous insufficiency (chronic) (peripheral): Secondary | ICD-10-CM

## 2018-05-22 DIAGNOSIS — E538 Deficiency of other specified B group vitamins: Secondary | ICD-10-CM

## 2018-05-22 DIAGNOSIS — E038 Other specified hypothyroidism: Secondary | ICD-10-CM

## 2018-05-22 DIAGNOSIS — M7581 Other shoulder lesions, right shoulder: Secondary | ICD-10-CM

## 2018-05-22 MED ORDER — CYANOCOBALAMIN 1000 MCG/ML IJ SOLN
1000.0000 ug | INTRAMUSCULAR | Status: AC
  Administered 2018-05-22 – 2018-12-08 (×7): 1000 ug via INTRAMUSCULAR

## 2018-05-22 MED ORDER — FUROSEMIDE 20 MG PO TABS: 20.0000 mg | ORAL_TABLET | Freq: Every day | ORAL | 3 refills | Status: DC

## 2018-05-22 NOTE — Patient Instructions (Addendum)
Medicare Annual Wellness Visit  [facility] and the medical providers at [facility] strive to bring you the best medical care.  In doing so we not only want to address your current medical conditions and concerns but also to detect new conditions early and prevent illness, disease and health-related problems.    Medicare offers a yearly Wellness Visit which allows our clinical staff to assess your need for preventative services including immunizations, lifestyle education, counseling to decrease risk of preventable diseases and screening for fall risk and other medical concerns.    This visit is provided free of charge (no copay) for all Medicare recipients. The clinical pharmacists at [facility] have begun to conduct these Wellness Visits which will also include a thorough review of all your medications.    As you primary medical provider recommend that you make an appointment for your Annual Wellness Visit if you have not done so already this year.  You may set up this appointment before you leave today or you may call back ([phone]) and schedule an appointment.  Please make sure when you call that you mention that you are scheduling your Annual Wellness Visit with the clinical pharmacist so that the appointment may be made for the proper length of time.      Continue current medications. Continue good therapeutic lifestyle changes which include good diet and exercise. Fall precautions discussed with patient. If an FOBT was given today- please return it to our front desk. If you are over 67 years old - you may need Prevnar 72 or the adult Pneumonia vaccine.  **Flu shots are available--- please call and schedule a FLU-CLINIC appointment**  After your visit with [place] today you will receive a survey in the mail or online from [employer] regarding your care with [place]. Please take a moment to fill this out. Your feedback is very  important to [place] as you can help [place] better understand your patient needs as well as improve your experience and satisfaction. WE CARE ABOUT YOU!!!   Take Lasix 20 mg 1 daily for the next 3 to 4 days and then one on Monday Wednesday and Friday for a couple weeks and then as needed You will need a repeat BMP after taking this for 2 to 3 weeks to make sure your potassium is okay. Watch sodium intake Elevate legs as much as possible and continue with support hose Check blood pressures regularly and if the blood pressure drops too much do not take any more Lasix or fluid pill. If the patient continues to have right shoulder pain she should return to the office for a shot of cortisone to the right deltoid tendon.

## 2018-05-22 NOTE — Progress Notes (Signed)
Subjective:    Patient ID: Miranda Wilson, female    DOB: 1951-07-26, 67 y.o.   MRN: [id number]  HPI Pt here for follow up and management of chronic medical problems which includes hyperlipidemia, hypertension and hypothyroid. She is taking medication regularly.  The patient is doing well overall.  She complains of some redness and her left lower leg and also with some popping in her right shoulder with pain.  She will get lab work today.  Her vital signs are stable and her body mass index is 35.97.  With 2 or more comorbid conditions she does have morbid obesity.  She also wants to get her B12 shot today.  She brings in blood pressures for review and all of these were good and will be scanned into the record.  The patient is feeling better since she reduce some of her blood pressure medicine is no longer having dizzy spells.  She denies any chest pain or increased shortness of breath.  She denies any trouble with her stomach including nausea vomiting diarrhea blood in the stool black tarry bowel movements or change in bowel habits.  She is passing her water as usual but just has the ongoing frequency.  No other symptoms.  She has an eye exam coming up in October.    Patient Active Problem List   Diagnosis Date Noted  . History of arthroplasty of right knee 03/05/2016  . OA (osteoarthritis) of knee 02/13/2016  . Leg edema 12/07/2015  . Preop cardiovascular exam 12/07/2015  . B12 deficiency 12/08/2014  . Metabolic syndrome 45/85/9292  . Bradycardia 05/31/2014  . Hyperlipemia 04/28/2013  . Vitamin D deficiency 04/28/2013  . Lumbar spondylosis with myelopathy 04/28/2013  . Osteoarthritis 04/28/2013  . Hypertension 04/28/2013  . Incontinence of urine 04/28/2013  . Hypothyroidism 04/28/2013  . Cough 08/21/2011  . Abnormal CXR 08/21/2011   Outpatient Encounter Medications as of 05/22/2018  Medication Sig  . albuterol (PROVENTIL HFA;VENTOLIN HFA) 108 (90 Base) MCG/ACT inhaler Inhale 2 puffs  into the lungs every 6 (six) hours as needed for wheezing or shortness of breath.  Marland Kitchen atenolol (TENORMIN) 50 MG tablet TAKE 1 TABLET (50 MG TOTAL) BY MOUTH DAILY. (Patient taking differently: take 1/2 tab po qd)  . BABY ASPIRIN PO Take 1 tablet by mouth daily.  . Cholecalciferol (VITAMIN D) 2000 units CAPS Take by mouth.  . levothyroxine (SYNTHROID, LEVOTHROID) 150 MCG tablet TAKE 1 TABLET BY MOUTH EVERY DAY (Patient taking differently: TAKE 1 TABLET BY MOUTH EVERY DAY, 1/2 tab on mon, wed, fri)  . Multiple Vitamins-Minerals (CENTRUM SILVER PO) Take 1 tablet by mouth daily.  . nabumetone (RELAFEN) 500 MG tablet Take 2 tablets by mouth daily as needed.  Marland Kitchen Propylene Glycol (SYSTANE BALANCE OP) Apply 1 drop to eye 2 (two) times daily as needed (Dry eyes). Reported on 01/31/2016  . rosuvastatin (CRESTOR) 40 MG tablet TAKE 1 TABLET BY MOUTH AS DIRECTED (Patient taking differently: 1/2 tab qd)  . docusate sodium (COLACE) 100 MG capsule Take 100 mg by mouth daily as needed.    No facility-administered encounter medications on file as of 05/22/2018.       Review of Systems  Constitutional: Negative.   HENT: Negative.   Eyes: Negative.   Respiratory: Negative.   Cardiovascular: Negative.   Gastrointestinal: Negative.   Endocrine: Negative.   Genitourinary: Negative.   Musculoskeletal: Positive for arthralgias (right shoudler POP/ pain at times ).  Skin: Negative.  Red skin - left lower leg  Allergic/Immunologic: Negative.   Neurological: Negative.  Negative for dizziness.  Hematological: Negative.   Psychiatric/Behavioral: Negative.        Objective:   Physical Exam  Constitutional: She is oriented to person, place, and time. She appears well-developed and well-nourished. No distress.  The patient is alert and pleasant and still having to use the 4 pronged cane for gait stability because of knee surgery which was not successful.  HENT:  Head: Normocephalic and atraumatic.  Right Ear:  External ear normal.  Left Ear: External ear normal.  Nose: Nose normal.  Mouth/Throat: Oropharynx is clear and moist. No oropharyngeal exudate.  Eyes: Pupils are equal, round, and reactive to light. Conjunctivae and EOM are normal. Right eye exhibits no discharge. Left eye exhibits no discharge. No scleral icterus.  To get eye exam in October.  Has recently had blepharoplasty.  Neck: Normal range of motion. Neck supple. No thyromegaly present.  No bruits thyromegaly or anterior cervical adenopathy  Cardiovascular: Regular rhythm, normal heart sounds and intact distal pulses.  No murmur heard. The heart is regular at 56/min.  Pulmonary/Chest: Effort normal and breath sounds normal. She has no wheezes. She has no rales.  Clear anteriorly and posteriorly  Abdominal: Soft. Bowel sounds are normal. She exhibits no mass. There is tenderness. There is no guarding.  Slight epigastric tenderness with no liver or spleen enlargement masses or bruits  Musculoskeletal: She exhibits edema, tenderness and deformity.  Patient is tender at the right deltoid insertion on the humerus.  She does have some mild edema in both lower extremities and slight redness that is tender in the right lower extremity medially.  This appears to be more venous stasis.  Lymphadenopathy:    She has no cervical adenopathy.  Neurological: She is alert and oriented to person, place, and time. She has normal reflexes. No cranial nerve deficit.  Skin: Skin is warm and dry. No rash noted. There is erythema.  Slight redness of right medial lower leg and tenderness.  We will use some additional fluid pills for short period of time to see if we can help improve this and she will continue with her support stockings.  Psychiatric: She has a normal mood and affect. Her behavior is normal. Judgment and thought content normal.  Patient's mood affect and behavior were normal.  Nursing note and vitals reviewed.  BP 135/64 (BP Location: Left  Arm)   Pulse (!) 44   Temp (!) 97 F (36.1 C) (Oral)   Ht 5' 3" (1.6 m)   Wt 204 lb (92.5 kg)   LMP 04/29/1991   BMI 36.14 kg/m         Assessment & Plan:  1. Metabolic syndrome -The patient should work aggressively on diet and exercise in order to achieve weight loss. - CBC with Differential/Platelet - BMP8+EGFR  2. Pure hypercholesterolemia -Continue current treatment pending results of lab work - CBC with Differential/Platelet - Lipid panel - Hepatic function panel  3. Vitamin D deficiency -Continue with vitamin D replacement pending results of lab work - CBC with Differential/Platelet - VITAMIN D 25 Hydroxy (Vit-D Deficiency, Fractures)  4. Essential hypertension, benign -The blood pressure is good today and home readings were good and she will continue with current treatment.  She is feeling better and not having any more lightheaded spells. - CBC with Differential/Platelet - BMP8+EGFR - Hepatic function panel  5. Other specified hypothyroidism -Continue with current treatment pending results of lab  work - CBC with Differential/Platelet - Thyroid Panel With TSH  6. Venous insufficiency of both lower extremities -Continue with support hose.  Take Lasix for the next 2 to 3 days on a regular basis and then may be on Monday Wednesday and Friday for a couple weeks and if redness gets worse patient should call back and she should be started on antibiotic.  7. Deltoid tendinitis of right shoulder -Use warm wet compresses to right deltoid insertion and consider returning for a point injection of deltoid tendon  8. Morbid obesity ([place]) -Continue with aggressive therapeutic lifestyle changes to achieve weight loss through diet and exercise  9. Vitamin B 12 deficiency -B12 injection today.  Meds ordered this encounter  Medications  . cyanocobalamin ((VITAMIN B-12)) injection 1,000 mcg   Patient Instructions                       Medicare Annual Wellness  Visit  [facility] and the medical providers at [facility] strive to bring you the best medical care.  In doing so we not only want to address your current medical conditions and concerns but also to detect new conditions early and prevent illness, disease and health-related problems.    Medicare offers a yearly Wellness Visit which allows our clinical staff to assess your need for preventative services including immunizations, lifestyle education, counseling to decrease risk of preventable diseases and screening for fall risk and other medical concerns.    This visit is provided free of charge (no copay) for all Medicare recipients. The clinical pharmacists at  have begun to conduct these Wellness Visits which will also include a thorough review of all your medications.    As you primary medical provider recommend that you make an appointment for your Annual Wellness Visit if you have not done so already this year.  You may set up this appointment before you leave today or you may call back ([phone]) and schedule an appointment.  Please make sure when you call that you mention that you are scheduling your Annual Wellness Visit with the clinical pharmacist so that the appointment may be made for the proper length of time.      Continue current medications. Continue good therapeutic lifestyle changes which include good diet and exercise. Fall precautions discussed with patient. If an FOBT was given today- please return it to our front desk. If you are over 81 years old - you may need Prevnar 36 or the adult Pneumonia vaccine.  **Flu shots are available--- please call and schedule a FLU-CLINIC appointment**  After your visit with [place] today you will receive a survey in the mail or online from [employer] regarding your care with [place]. Please take a moment to fill this out. Your feedback is very important to [place] as you can help [place] better  understand your patient needs as well as improve your experience and satisfaction. WE CARE ABOUT YOU!!!   Take Lasix 20 mg 1 daily for the next 3 to 4 days and then one on Monday Wednesday and Friday for a couple weeks and then as needed You will need a repeat BMP after taking this for 2 to 3 weeks to make sure your potassium is okay. Watch sodium intake Elevate legs as much as possible and continue with support hose Check blood pressures regularly and if the blood pressure drops too much do not take any more Lasix or fluid pill. If the  patient continues to have right shoulder pain she should return to the office for a shot of cortisone to the right deltoid tendon.  Arrie Senate MD

## 2018-05-23 LAB — BMP8+EGFR
BUN/Creatinine Ratio: 16 (ref 12–28)
BUN: 14 mg/dL (ref 8–27)
CO2: 24 mmol/L (ref 20–29)
Calcium: 9.3 mg/dL (ref 8.7–10.3)
Chloride: 107 mmol/L — ABNORMAL HIGH (ref 96–106)
Creatinine, Ser: 0.85 mg/dL (ref 0.57–1.00)
GFR calc Af Amer: 81 mL/min/{1.73_m2} (ref >59)
GFR, EST NON AFRICAN AMERICAN: 70 mL/min/{1.73_m2} (ref >59)
Glucose: 89 mg/dL (ref 65–99)
Potassium: 4.4 mmol/L (ref 3.5–5.2)
Sodium: 145 mmol/L — ABNORMAL HIGH (ref 134–144)

## 2018-05-23 LAB — CBC WITH DIFFERENTIAL/PLATELET
Basophils Absolute: 0 10*3/uL (ref 0.0–0.2)
Basos: 1 %
EOS (ABSOLUTE): 0.1 10*3/uL (ref 0.0–0.4)
EOS: 4 %
HEMATOCRIT: 39.2 % (ref 34.0–46.6)
Hemoglobin: 12.4 g/dL (ref 11.1–15.9)
Immature Grans (Abs): 0 10*3/uL (ref 0.0–0.1)
Immature Granulocytes: 0 %
Lymphocytes Absolute: 1.2 10*3/uL (ref 0.7–3.1)
Lymphs: 36 %
MCH: 27 pg (ref 26.6–33.0)
MCHC: 31.6 g/dL (ref 31.5–35.7)
MCV: 85 fL (ref 79–97)
Monocytes Absolute: 0.5 10*3/uL (ref 0.1–0.9)
Monocytes: 16 %
Neutrophils Absolute: 1.5 10*3/uL (ref 1.4–7.0)
Neutrophils: 43 %
Platelets: 192 10*3/uL (ref 150–450)
RBC: 4.6 x10E6/uL (ref 3.77–5.28)
RDW: 14.5 % (ref 12.3–15.4)
WBC: 3.4 10*3/uL (ref 3.4–10.8)

## 2018-05-23 LAB — LIPID PANEL
Chol/HDL Ratio: 3.2 ratio (ref 0.0–4.4)
Cholesterol, Total: 195 mg/dL (ref 100–199)
HDL: 61 mg/dL (ref >39)
LDL Calculated: 114 mg/dL — ABNORMAL HIGH (ref 0–99)
Triglycerides: 98 mg/dL (ref 0–149)
VLDL Cholesterol Cal: 20 mg/dL (ref 5–40)

## 2018-05-23 LAB — THYROID PANEL WITH TSH
Free Thyroxine Index: 2.4 (ref 1.2–4.9)
T3 Uptake Ratio: 23 % — ABNORMAL LOW (ref 24–39)
T4, Total: 10.3 ug/dL (ref 4.5–12.0)
TSH: 0.297 u[IU]/mL — ABNORMAL LOW (ref 0.450–4.500)

## 2018-05-23 LAB — HEPATIC FUNCTION PANEL
ALT: 13 IU/L (ref 0–32)
AST: 22 IU/L (ref 0–40)
Albumin: 4.3 g/dL (ref 3.6–4.8)
Alkaline Phosphatase: 105 IU/L (ref 39–117)
Bilirubin Total: 0.4 mg/dL (ref 0.0–1.2)
Bilirubin, Direct: 0.13 mg/dL (ref 0.00–0.40)
Total Protein: 6.5 g/dL (ref 6.0–8.5)

## 2018-05-23 LAB — VITAMIN D 25 HYDROXY (VIT D DEFICIENCY, FRACTURES): Vit D, 25-Hydroxy: 42.3 ng/mL (ref 30.0–100.0)

## 2018-05-23 LAB — VITAMIN B12: Vitamin B-12: 594 pg/mL (ref 232–1245)

## 2018-05-26 ENCOUNTER — Other Ambulatory Visit: Payer: Self-pay

## 2018-05-26 ENCOUNTER — Encounter: Payer: Self-pay | Admitting: Family Medicine

## 2018-05-27 ENCOUNTER — Other Ambulatory Visit: Payer: Self-pay | Admitting: *Deleted

## 2018-05-27 DIAGNOSIS — R7989 Other specified abnormal findings of blood chemistry: Secondary | ICD-10-CM

## 2018-06-05 ENCOUNTER — Telehealth: Payer: Self-pay | Admitting: Family Medicine

## 2018-06-05 NOTE — Telephone Encounter (Signed)
appt given  

## 2018-06-05 NOTE — Telephone Encounter (Signed)
Have patient come in so we can inject her shoulder to see if we can make this feel better prior to sending her to the orthopedic surgeon.

## 2018-06-09 ENCOUNTER — Ambulatory Visit: Payer: Medicare Other | Admitting: Family Medicine

## 2018-06-09 ENCOUNTER — Ambulatory Visit (INDEPENDENT_AMBULATORY_CARE_PROVIDER_SITE_OTHER): Payer: Medicare Other

## 2018-06-09 ENCOUNTER — Encounter: Payer: Self-pay | Admitting: Family Medicine

## 2018-06-09 VITALS — BP 129/68 | HR 54 | Temp 97.4°F | Ht 63.0 in | Wt 205.0 lb

## 2018-06-09 DIAGNOSIS — G609 Hereditary and idiopathic neuropathy, unspecified: Secondary | ICD-10-CM

## 2018-06-09 DIAGNOSIS — M19011 Primary osteoarthritis, right shoulder: Secondary | ICD-10-CM

## 2018-06-09 DIAGNOSIS — M25511 Pain in right shoulder: Secondary | ICD-10-CM

## 2018-06-09 MED ORDER — TRIAMCINOLONE ACETONIDE 40 MG/ML IJ SUSP
20.0000 mg | Freq: Once | INTRAMUSCULAR | Status: AC
  Administered 2018-06-09: 20 mg via INTRA_ARTICULAR

## 2018-06-09 MED ORDER — ALBUTEROL SULFATE HFA 108 (90 BASE) MCG/ACT IN AERS: 2.0000 | INHALATION_SPRAY | Freq: Four times a day (QID) | RESPIRATORY_TRACT | 11 refills | Status: DC | PRN

## 2018-06-09 NOTE — Progress Notes (Signed)
Subjective:    Patient ID: Miranda Wilson, female    DOB: 08-08-1951, 67 y.o.   MRN: [id number]  HPI Patient here today for right shoulder pain and wants a injection.  The patient today complains that the right hand feels cold.  There is no tingling or numbness.  She discussed the right shoulder pain at her last visit.    Patient Active Problem List   Diagnosis Date Noted  . History of arthroplasty of right knee 03/05/2016  . OA (osteoarthritis) of knee 02/13/2016  . Leg edema 12/07/2015  . Preop cardiovascular exam 12/07/2015  . B12 deficiency 12/08/2014  . Metabolic syndrome 93/71/6967  . Bradycardia 05/31/2014  . Hyperlipemia 04/28/2013  . Vitamin D deficiency 04/28/2013  . Lumbar spondylosis with myelopathy 04/28/2013  . Osteoarthritis 04/28/2013  . Hypertension 04/28/2013  . Incontinence of urine 04/28/2013  . Hypothyroidism 04/28/2013  . Cough 08/21/2011  . Abnormal CXR 08/21/2011   Outpatient Encounter Medications as of 06/09/2018  Medication Sig  . albuterol (PROVENTIL HFA;VENTOLIN HFA) 108 (90 Base) MCG/ACT inhaler Inhale 2 puffs into the lungs every 6 (six) hours as needed for wheezing or shortness of breath.  Marland Kitchen atenolol (TENORMIN) 50 MG tablet TAKE 1 TABLET (50 MG TOTAL) BY MOUTH DAILY. (Patient taking differently: take 1/2 tab po qd)  . BABY ASPIRIN PO Take 1 tablet by mouth daily.  . Cholecalciferol (VITAMIN D) 2000 units CAPS Take by mouth.  . docusate sodium (COLACE) 100 MG capsule Take 100 mg by mouth daily as needed.   . furosemide (LASIX) 20 MG tablet Take 1 tablet (20 mg total) by mouth daily.  Marland Kitchen levothyroxine (SYNTHROID, LEVOTHROID) 150 MCG tablet TAKE 1 TABLET BY MOUTH EVERY DAY (Patient taking differently: TAKE 1 TABLET BY MOUTH EVERY DAY, 1/2 tab on mon, wed, fri)  . Multiple Vitamins-Minerals (CENTRUM SILVER PO) Take 1 tablet by mouth daily.  . nabumetone (RELAFEN) 500 MG tablet Take 2 tablets by mouth daily as needed.  Marland Kitchen Propylene Glycol (SYSTANE  BALANCE OP) Apply 1 drop to eye 2 (two) times daily as needed (Dry eyes). Reported on 01/31/2016  . rosuvastatin (CRESTOR) 40 MG tablet TAKE 1 TABLET BY MOUTH AS DIRECTED (Patient taking differently: 1/2 tab qd)   Facility-Administered Encounter Medications as of 06/09/2018  Medication  . cyanocobalamin ((VITAMIN B-12)) injection 1,000 mcg     Review of Systems  Constitutional: Negative.   HENT: Negative.   Eyes: Negative.   Respiratory: Negative.   Cardiovascular: Negative.   Gastrointestinal: Negative.   Endocrine: Negative.   Genitourinary: Negative.   Musculoskeletal: Positive for arthralgias (right shoulder pain).  Skin: Negative.   Allergic/Immunologic: Negative.   Neurological: Negative.   Hematological: Negative.   Psychiatric/Behavioral: Negative.        Objective:   Physical Exam  BP 129/68 (BP Location: Left Arm)   Pulse (!) 54   Temp (!) 97.4 F (36.3 C) (Oral)   Ht 5\' 3"  (1.6 m)   Wt 205 lb (93 kg)   LMP 04/29/1991   BMI 36.31 kg/m   Tenderness at the right [facility] joint with palpation.  There is also some tenderness in the insertion of the deltoid but more in the right [facility] joint area.  20 of Depo-Medrol and half a cc of Marcaine injected into area of point tenderness.  Patient tolerated the procedure well.  We will get x-rays of the right shoulder as she is leaving office.     Assessment & Plan:  1. Right shoulder  pain, unspecified chronicity -Probable right acromioclavicular arthritis. - DG Shoulder Right; Future  2. Idiopathic peripheral neuropathy -She has had some coldness in her right hand over the weekend.  3. Arthritis of right acromioclavicular joint -20 mg of Kenalog and 1/2 cc of Marcaine injected to the area of point tenderness.  Patient tolerated the procedure well.  This was done after thorough cleansing and a pressure dressing was applied.  Patient Instructions  Use warm wet compresses and gentle range of motion exercises If shoulder continues  to bother you please call us and we will set up an appointment with Dr. Windle Guard for follow-up  Arrie Senate MD

## 2018-06-09 NOTE — Patient Instructions (Signed)
Use warm wet compresses and gentle range of motion exercises If shoulder continues to bother you please call us and we will set up an appointment with Dr. Windle Guard for follow-up

## 2018-06-13 ENCOUNTER — Other Ambulatory Visit: Payer: Self-pay | Admitting: Family Medicine

## 2018-06-23 ENCOUNTER — Ambulatory Visit (INDEPENDENT_AMBULATORY_CARE_PROVIDER_SITE_OTHER): Payer: Medicare Other | Admitting: *Deleted

## 2018-06-23 DIAGNOSIS — E538 Deficiency of other specified B group vitamins: Secondary | ICD-10-CM

## 2018-06-23 NOTE — Progress Notes (Signed)
Pt given Cyanocobalamin inj Tolerated well 

## 2018-07-24 ENCOUNTER — Ambulatory Visit (INDEPENDENT_AMBULATORY_CARE_PROVIDER_SITE_OTHER): Payer: Medicare Other | Admitting: *Deleted

## 2018-07-24 DIAGNOSIS — E538 Deficiency of other specified B group vitamins: Secondary | ICD-10-CM

## 2018-07-24 NOTE — Progress Notes (Signed)
Pt given cyanocobalamin inj Tolerated well 

## 2018-08-25 ENCOUNTER — Ambulatory Visit (INDEPENDENT_AMBULATORY_CARE_PROVIDER_SITE_OTHER): Payer: Medicare Other | Admitting: *Deleted

## 2018-08-25 DIAGNOSIS — E538 Deficiency of other specified B group vitamins: Secondary | ICD-10-CM

## 2018-08-25 NOTE — Progress Notes (Signed)
Pt given cyanocobalamin inj Tolerated well 

## 2018-09-26 ENCOUNTER — Ambulatory Visit: Payer: Medicare Other

## 2018-09-30 ENCOUNTER — Encounter: Payer: Self-pay | Admitting: Family Medicine

## 2018-09-30 ENCOUNTER — Ambulatory Visit (INDEPENDENT_AMBULATORY_CARE_PROVIDER_SITE_OTHER): Payer: Medicare Other

## 2018-09-30 ENCOUNTER — Ambulatory Visit (INDEPENDENT_AMBULATORY_CARE_PROVIDER_SITE_OTHER): Payer: Medicare Other | Admitting: Family Medicine

## 2018-09-30 VITALS — BP 150/90 | HR 48 | Temp 97.0°F | Ht 63.0 in | Wt 198.0 lb

## 2018-09-30 DIAGNOSIS — M25662 Stiffness of left knee, not elsewhere classified: Secondary | ICD-10-CM

## 2018-09-30 DIAGNOSIS — E559 Vitamin D deficiency, unspecified: Secondary | ICD-10-CM | POA: Diagnosis not present

## 2018-09-30 DIAGNOSIS — I10 Essential (primary) hypertension: Secondary | ICD-10-CM

## 2018-09-30 DIAGNOSIS — Z780 Asymptomatic menopausal state: Secondary | ICD-10-CM

## 2018-09-30 DIAGNOSIS — E8881 Metabolic syndrome: Secondary | ICD-10-CM

## 2018-09-30 DIAGNOSIS — E038 Other specified hypothyroidism: Secondary | ICD-10-CM

## 2018-09-30 DIAGNOSIS — Z13820 Encounter for screening for osteoporosis: Secondary | ICD-10-CM

## 2018-09-30 DIAGNOSIS — E7800 Pure hypercholesterolemia, unspecified: Secondary | ICD-10-CM

## 2018-09-30 DIAGNOSIS — G8929 Other chronic pain: Secondary | ICD-10-CM

## 2018-09-30 DIAGNOSIS — M25562 Pain in left knee: Secondary | ICD-10-CM

## 2018-09-30 DIAGNOSIS — Z1283 Encounter for screening for malignant neoplasm of skin: Secondary | ICD-10-CM

## 2018-09-30 DIAGNOSIS — E538 Deficiency of other specified B group vitamins: Secondary | ICD-10-CM | POA: Diagnosis not present

## 2018-09-30 NOTE — Patient Instructions (Addendum)
Medicare Annual Wellness Visit  [facility] and the medical providers at [facility] strive to bring you the best medical care.  In doing so we not only want to address your current medical conditions and concerns but also to detect new conditions early and prevent illness, disease and health-related problems.    Medicare offers a yearly Wellness Visit which allows our clinical staff to assess your need for preventative services including immunizations, lifestyle education, counseling to decrease risk of preventable diseases and screening for fall risk and other medical concerns.    This visit is provided free of charge (no copay) for all Medicare recipients. The clinical pharmacists at [facility] have begun to conduct these Wellness Visits which will also include a thorough review of all your medications.    As you primary medical provider recommend that you make an appointment for your Annual Wellness Visit if you have not done so already this year.  You may set up this appointment before you leave today or you may call back ([phone]) and schedule an appointment.  Please make sure when you call that you mention that you are scheduling your Annual Wellness Visit with the clinical pharmacist so that the appointment may be made for the proper length of time.     Continue current medications. Continue good therapeutic lifestyle changes which include good diet and exercise. Fall precautions discussed with patient. If an FOBT was given today- please return it to our front desk. If you are over 64 years old - you may need Prevnar 69 or the adult Pneumonia vaccine.  **Flu shots are available--- please call and schedule a FLU-CLINIC appointment**  After your visit with [place] today you will receive a survey in the mail or online from [employer] regarding your care with [place]. Please take a moment to fill this out. Your feedback is very  important to [place] as you can help [place] better understand your patient needs as well as improve your experience and satisfaction. WE CARE ABOUT YOU!!!   Watch sodium intake Check blood pressures at home and bring readings by and have blood pressure checked here with our monitor and with patient's monitor Drink more water Use soaps fabric softeners and detergents that are scent free We will arrange for you to have an appointment to see the orthopedic surgeon when he comes to this office to follow-up on the knee pain Patient should keep appointment with urologist because of incontinence Continue to get B12 shots regularly If issues with memory continue to occur we will be happy to arrange a visit with the neurologist for further evaluation Continue to get eye exams regularly We will schedule visit with dermatology because of skin tags and a lesion anterior to the right ear.

## 2018-09-30 NOTE — Progress Notes (Signed)
Subjective:    Patient ID: Miranda Wilson, female    DOB: [birth year], 67 y.o.   MRN: [id number]  HPI Pt here for follow up and management of chronic medical problems which includes hypothyroid, hypertension and hyperlipidemia. She is taking medication regularly.  The patient is doing well overall but does have several complaints today.  She complains of memory changes stiffness in the left knee not in the left calf clumsiness and skin tags.  Because of the memory issues and MMSE was done and she got 30 out of 30.  A repeat blood pressure here in the office was 141/69.  Home readings were higher.  She will schedule a mammogram get a DEXA scan will be given an FOBT to return and will get lab work today.  Both of the patient's parents are deceased.  The father had emphysema and myasthenia gravis and asthma.  The mother has COPD and epilepsy.  All of her siblings are still living in the biggest health issue for them appears to be hyperlipidemia and heart disease.  The patient today denies any chest pain pressure tightness or shortness of breath anymore than usual.  She denies any GI symptoms and denies any abdominal pain or blood in the stool or heartburn or indigestion.  She has ongoing chronic incontinence.  This is gotten worse since her knee surgeries and she will call the urologist and schedule a visit back with him to see if there is anything else that can be done about this.   Patient Active Problem List   Diagnosis Date Noted  . History of arthroplasty of right knee 03/05/2016  . OA (osteoarthritis) of knee 02/13/2016  . Leg edema 12/07/2015  . Preop cardiovascular exam 12/07/2015  . B12 deficiency 12/08/2014  . Metabolic syndrome 78/58/8502  . Bradycardia 05/31/2014  . Hyperlipemia 04/28/2013  . Vitamin D deficiency 04/28/2013  . Lumbar spondylosis with myelopathy 04/28/2013  . Osteoarthritis 04/28/2013  . Hypertension 04/28/2013  . Incontinence of urine 04/28/2013  . Hypothyroidism  04/28/2013  . Cough 08/21/2011  . Abnormal CXR 08/21/2011   Outpatient Encounter Medications as of 09/30/2018  Medication Sig  . albuterol (PROVENTIL HFA;VENTOLIN HFA) 108 (90 Base) MCG/ACT inhaler Inhale 2 puffs into the lungs every 6 (six) hours as needed for wheezing or shortness of breath.  Marland Kitchen atenolol (TENORMIN) 50 MG tablet TAKE 1 TABLET (50 MG TOTAL) BY MOUTH DAILY. (Patient taking differently: take 1/2 tab po qd)  . Cholecalciferol (VITAMIN D) 2000 units CAPS Take by mouth.  . docusate sodium (COLACE) 100 MG capsule Take 100 mg by mouth daily as needed.   . furosemide (LASIX) 20 MG tablet Take 1 tablet (20 mg total) by mouth daily.  Marland Kitchen levothyroxine (SYNTHROID, LEVOTHROID) 150 MCG tablet TAKE 1 TABLET BY MOUTH EVERY DAY (Patient taking differently: TAKE 1 TABLET BY MOUTH EVERY DAY, 1/2 tab on mon, wed, fri)  . Multiple Vitamins-Minerals (CENTRUM SILVER PO) Take 1 tablet by mouth daily.  . nabumetone (RELAFEN) 500 MG tablet Take 2 tablets by mouth daily as needed.  Marland Kitchen Propylene Glycol (SYSTANE BALANCE OP) Apply 1 drop to eye 2 (two) times daily as needed (Dry eyes). Reported on 01/31/2016  . rosuvastatin (CRESTOR) 40 MG tablet TAKE 1 TABLET BY MOUTH EVERY DAY AS DIRECTED  . [DISCONTINUED] BABY ASPIRIN PO Take 1 tablet by mouth daily.   Facility-Administered Encounter Medications as of 09/30/2018  Medication  . cyanocobalamin ((VITAMIN B-12)) injection 1,000 mcg      Review of  Systems  Constitutional: Negative.   HENT: Negative.   Eyes: Negative.   Respiratory: Negative.   Cardiovascular: Negative.        Discuss BP   Gastrointestinal: Negative.   Endocrine: Negative.   Genitourinary: Negative.   Musculoskeletal: Negative.        Left knee stiffness/ knot at left calf - some swelling  Skin: Negative.        Skin tags   Allergic/Immunologic: Negative.   Neurological: Negative.   Hematological: Negative.   Psychiatric/Behavioral: Negative.        Trouble remembering things/  names       Objective:   Physical Exam  Constitutional: She is oriented to person, place, and time. She appears well-developed and well-nourished. No distress.  Patient is pleasant and alert and is trying to do the best she can with maintaining her memory or joint issues her weight her incontinence issues etc.  She is somewhat frustrated with all of this.  HENT:  Head: Normocephalic and atraumatic.  Right Ear: External ear normal.  Left Ear: External ear normal.  Nose: Nose normal.  Mouth/Throat: Oropharynx is clear and moist. No oropharyngeal exudate.  Eyes: Pupils are equal, round, and reactive to light. Conjunctivae and EOM are normal. Right eye exhibits no discharge. Left eye exhibits no discharge. No scleral icterus.  Patient has had recent blepharoplasty and the results of this looked great.  She is actually still due to get a regular eye exam and plans to follow through with this.  Neck: Normal range of motion. Neck supple. No thyromegaly present.  No bruits thyromegaly or anterior cervical adenopathy  Cardiovascular: Normal rate, regular rhythm, normal heart sounds and intact distal pulses.  No murmur heard. Heart is regular at 72/min.  Still pulses were present but difficult to palpate.  Pulmonary/Chest: Effort normal and breath sounds normal. She has no wheezes. She has no rales.  Clear anteriorly and posteriorly  Abdominal: Soft. Bowel sounds are normal. She exhibits no mass. There is no tenderness.  The abdomen is obese with slight epigastric and suprapubic tenderness.  There is no organ enlargement masses or bruits noted.  Musculoskeletal: She exhibits no edema or tenderness.  Patient has had bilateral knee replacement and has stiffness and weakness patient has venous insufficiency with lots of varicosities around the left ankle.  Lymphadenopathy:    She has no cervical adenopathy.  Neurological: She is alert and oriented to person, place, and time. She has normal reflexes.  No cranial nerve deficit.  Skin: Skin is warm and dry. No rash noted. There is erythema.  Psychiatric: She has a normal mood and affect. Her behavior is normal. Judgment and thought content normal.  Mood affect and behavior were normal for her and she scored a 30 on the MMSE.  Nursing note and vitals reviewed.  BP (!) 141/69 (BP Location: Left Arm)   Pulse (!) 48   Temp (!) 97 F (36.1 C) (Oral)   Ht '5\' 3"'  (1.6 m)   Wt 198 lb (89.8 kg)   LMP 04/29/1991   BMI 35.07 kg/m         Assessment & Plan:  1. Pure hypercholesterolemia -Continue with current treatment and with as aggressive therapeutic lifestyle changes as possible including diet and exercise - CBC with Differential/Platelet - Lipid panel  2. Vitamin D deficiency -Continue with vitamin D replacement pending results of lab work - CBC with Differential/Platelet - VITAMIN D 25 Hydroxy (Vit-D Deficiency, Fractures) - DG [facility] DEXA; Future  3. Essential hypertension, benign -Blood pressure readings were slightly on the elevated side today and home readings were more elevated than those in the office.  Patient will continue to work on weight and sodium restriction and will bring her readings back in in 3 to 4 weeks so we can compare her readings to our readings at that time and she will also bring her monitor and also.  In the meantime she will continue to work on weight and salt restriction - BMP8+EGFR - CBC with Differential/Platelet - Hepatic function panel  4. Metabolic syndrome -Continue to work with losing weight through diet and exercise - BMP8+EGFR - CBC with Differential/Platelet  5. Other specified hypothyroidism -Continue current treatment pending results of lab work - CBC with Differential/Platelet - Thyroid Panel With TSH  6. Screening for osteoporosis - DG [facility] DEXA; Future  7. Postmenopausal - DG [facility] DEXA; Future  8. Chronic pain of left knee -Follow-up with orthopedist - Ambulatory referral to  Orthopedic Surgery  9. Knee stiff, left -Follow-up with orthopedist - Ambulatory referral to Orthopedic Surgery  10. Skin cancer screening -Arrange visit with dermatology - Ambulatory referral to Dermatology  11. Vitamin B 12 deficiency -Continue to get B12 injections regularly - Vitamin B12  Patient Instructions                       Medicare Annual Wellness Visit  [facility] and the medical providers at [facility] strive to bring you the best medical care.  In doing so we not only want to address your current medical conditions and concerns but also to detect new conditions early and prevent illness, disease and health-related problems.    Medicare offers a yearly Wellness Visit which allows our clinical staff to assess your need for preventative services including immunizations, lifestyle education, counseling to decrease risk of preventable diseases and screening for fall risk and other medical concerns.    This visit is provided free of charge (no copay) for all Medicare recipients. The clinical pharmacists at [facility] have begun to conduct these Wellness Visits which will also include a thorough review of all your medications.    As you primary medical provider recommend that you make an appointment for your Annual Wellness Visit if you have not done so already this year.  You may set up this appointment before you leave today or you may call back ([phone]) and schedule an appointment.  Please make sure when you call that you mention that you are scheduling your Annual Wellness Visit with the clinical pharmacist so that the appointment may be made for the proper length of time.     Continue current medications. Continue good therapeutic lifestyle changes which include good diet and exercise. Fall precautions discussed with patient. If an FOBT was given today- please return it to our front desk. If you are over 70 years old - you  may need Prevnar 2 or the adult Pneumonia vaccine.  **Flu shots are available--- please call and schedule a FLU-CLINIC appointment**  After your visit with [place] today you will receive a survey in the mail or online from [employer] regarding your care with [place]. Please take a moment to fill this out. Your feedback is very important to [place] as you can help [place] better understand your patient needs as well as improve your experience and satisfaction. WE CARE ABOUT YOU!!!   Watch sodium intake Check blood pressures at home and bring  readings by and have blood pressure checked here with our monitor and with patient's monitor Drink more water Use soaps fabric softeners and detergents that are scent free We will arrange for you to have an appointment to see the orthopedic surgeon when he comes to this office to follow-up on the knee pain Patient should keep appointment with urologist because of incontinence Continue to get B12 shots regularly If issues with memory continue to occur we will be happy to arrange a visit with the neurologist for further evaluation Continue to get eye exams regularly We will schedule visit with dermatology because of skin tags and a lesion anterior to the right ear.   Arrie Senate MD

## 2018-10-01 LAB — LIPID PANEL
CHOLESTEROL TOTAL: 183 mg/dL (ref 100–199)
Chol/HDL Ratio: 2.9 ratio (ref 0.0–4.4)
HDL: 64 mg/dL (ref >39)
LDL Calculated: 99 mg/dL (ref 0–99)
Triglycerides: 102 mg/dL (ref 0–149)
VLDL Cholesterol Cal: 20 mg/dL (ref 5–40)

## 2018-10-01 LAB — BMP8+EGFR
BUN / CREAT RATIO: 17 (ref 12–28)
BUN: 16 mg/dL (ref 8–27)
CHLORIDE: 106 mmol/L (ref 96–106)
CO2: 21 mmol/L (ref 20–29)
Calcium: 9.3 mg/dL (ref 8.7–10.3)
Creatinine, Ser: 0.95 mg/dL (ref 0.57–1.00)
GFR calc Af Amer: 71 mL/min/{1.73_m2} (ref >59)
GFR calc non Af Amer: 61 mL/min/{1.73_m2} (ref >59)
GLUCOSE: 83 mg/dL (ref 65–99)
Potassium: 4.1 mmol/L (ref 3.5–5.2)
SODIUM: 143 mmol/L (ref 134–144)

## 2018-10-01 LAB — THYROID PANEL WITH TSH
FREE THYROXINE INDEX: 2.2 (ref 1.2–4.9)
T3 Uptake Ratio: 25 % (ref 24–39)
T4, Total: 8.9 ug/dL (ref 4.5–12.0)
TSH: 1.16 u[IU]/mL (ref 0.450–4.500)

## 2018-10-01 LAB — HEPATIC FUNCTION PANEL
ALBUMIN: 4.2 g/dL (ref 3.6–4.8)
ALK PHOS: 111 IU/L (ref 39–117)
ALT: 13 IU/L (ref 0–32)
AST: 26 IU/L (ref 0–40)
BILIRUBIN, DIRECT: 0.16 mg/dL (ref 0.00–0.40)
Bilirubin Total: 0.5 mg/dL (ref 0.0–1.2)
Total Protein: 6.9 g/dL (ref 6.0–8.5)

## 2018-10-01 LAB — CBC WITH DIFFERENTIAL/PLATELET
BASOS ABS: 0 10*3/uL (ref 0.0–0.2)
Basos: 0 %
EOS (ABSOLUTE): 0.1 10*3/uL (ref 0.0–0.4)
Eos: 2 %
Hematocrit: 38.6 % (ref 34.0–46.6)
Hemoglobin: 12.9 g/dL (ref 11.1–15.9)
Immature Grans (Abs): 0 10*3/uL (ref 0.0–0.1)
Immature Granulocytes: 0 %
LYMPHS ABS: 1.4 10*3/uL (ref 0.7–3.1)
Lymphs: 30 %
MCH: 28.3 pg (ref 26.6–33.0)
MCHC: 33.4 g/dL (ref 31.5–35.7)
MCV: 85 fL (ref 79–97)
MONOS ABS: 0.5 10*3/uL (ref 0.1–0.9)
Monocytes: 12 %
Neutrophils Absolute: 2.5 10*3/uL (ref 1.4–7.0)
Neutrophils: 56 %
PLATELETS: 204 10*3/uL (ref 150–450)
RBC: 4.56 x10E6/uL (ref 3.77–5.28)
RDW: 14.4 % (ref 12.3–15.4)
WBC: 4.5 10*3/uL (ref 3.4–10.8)

## 2018-10-01 LAB — VITAMIN B12: Vitamin B-12: >2000 pg/mL — ABNORMAL HIGH (ref 232–1245)

## 2018-10-01 LAB — VITAMIN D 25 HYDROXY (VIT D DEFICIENCY, FRACTURES): Vit D, 25-Hydroxy: 43 ng/mL (ref 30.0–100.0)

## 2018-11-03 ENCOUNTER — Other Ambulatory Visit: Payer: Medicare Other

## 2018-11-03 ENCOUNTER — Ambulatory Visit (INDEPENDENT_AMBULATORY_CARE_PROVIDER_SITE_OTHER): Payer: Medicare Other | Admitting: *Deleted

## 2018-11-03 DIAGNOSIS — E538 Deficiency of other specified B group vitamins: Secondary | ICD-10-CM

## 2018-11-03 DIAGNOSIS — Z1212 Encounter for screening for malignant neoplasm of rectum: Secondary | ICD-10-CM

## 2018-11-03 NOTE — Progress Notes (Signed)
Pt given Cyanocobalamin inj Tolerated well 

## 2018-11-04 LAB — FECAL OCCULT BLOOD, IMMUNOCHEMICAL: FECAL OCCULT BLD: NEGATIVE

## 2018-11-06 ENCOUNTER — Other Ambulatory Visit: Payer: Self-pay

## 2018-11-06 NOTE — Patient Outreach (Signed)
[facility]) Care Management  11/06/2018  Miranda Wilson [birth year] [id number]   Medication Adherence call to Miranda Wilson patient is due on  Rosuvastatin 40 mg she is only taking 1/2 tablet per doctor instructions not 1 tablet daily patient explain she is having side effects (back pain) patient is showing past due under [facility].   [facility] Management Direct Dial [phone]  Fax [phone] [name]

## 2018-12-08 ENCOUNTER — Ambulatory Visit (INDEPENDENT_AMBULATORY_CARE_PROVIDER_SITE_OTHER): Payer: Medicare Other | Admitting: *Deleted

## 2018-12-08 DIAGNOSIS — E538 Deficiency of other specified B group vitamins: Secondary | ICD-10-CM

## 2018-12-08 NOTE — Progress Notes (Signed)
Pt given cyanocobalamin inj Tolerated well 

## 2019-02-02 ENCOUNTER — Ambulatory Visit: Payer: Medicare Other | Admitting: Family Medicine

## 2019-02-25 ENCOUNTER — Telehealth: Payer: Self-pay | Admitting: Family Medicine

## 2019-03-04 ENCOUNTER — Other Ambulatory Visit: Payer: Self-pay | Admitting: Family Medicine

## 2019-03-30 IMAGING — DX DG CHEST 2V
2 series · 2 of 2 positions shown · non-contrast
Comparison: PA and lateral chest 07/18/2016.

CLINICAL DATA: Cough.

EXAM:
CHEST  2 VIEW

[chest pa]
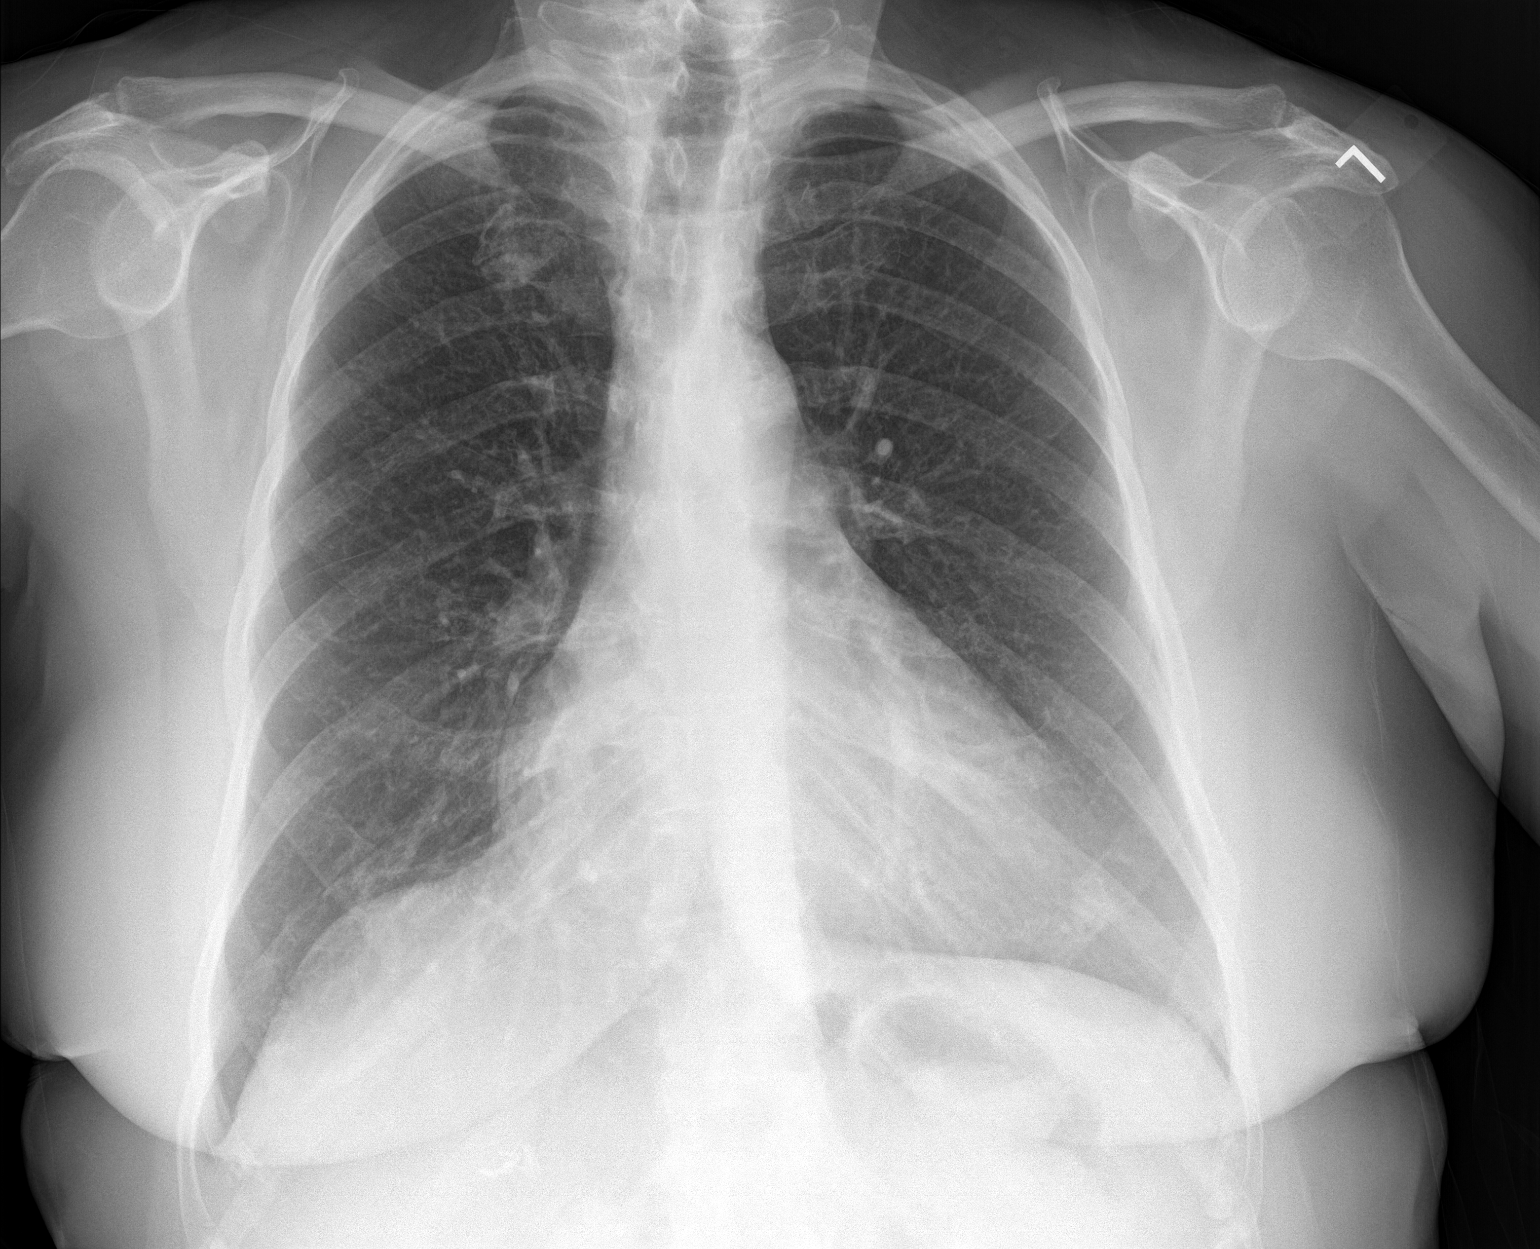

[chest lat]
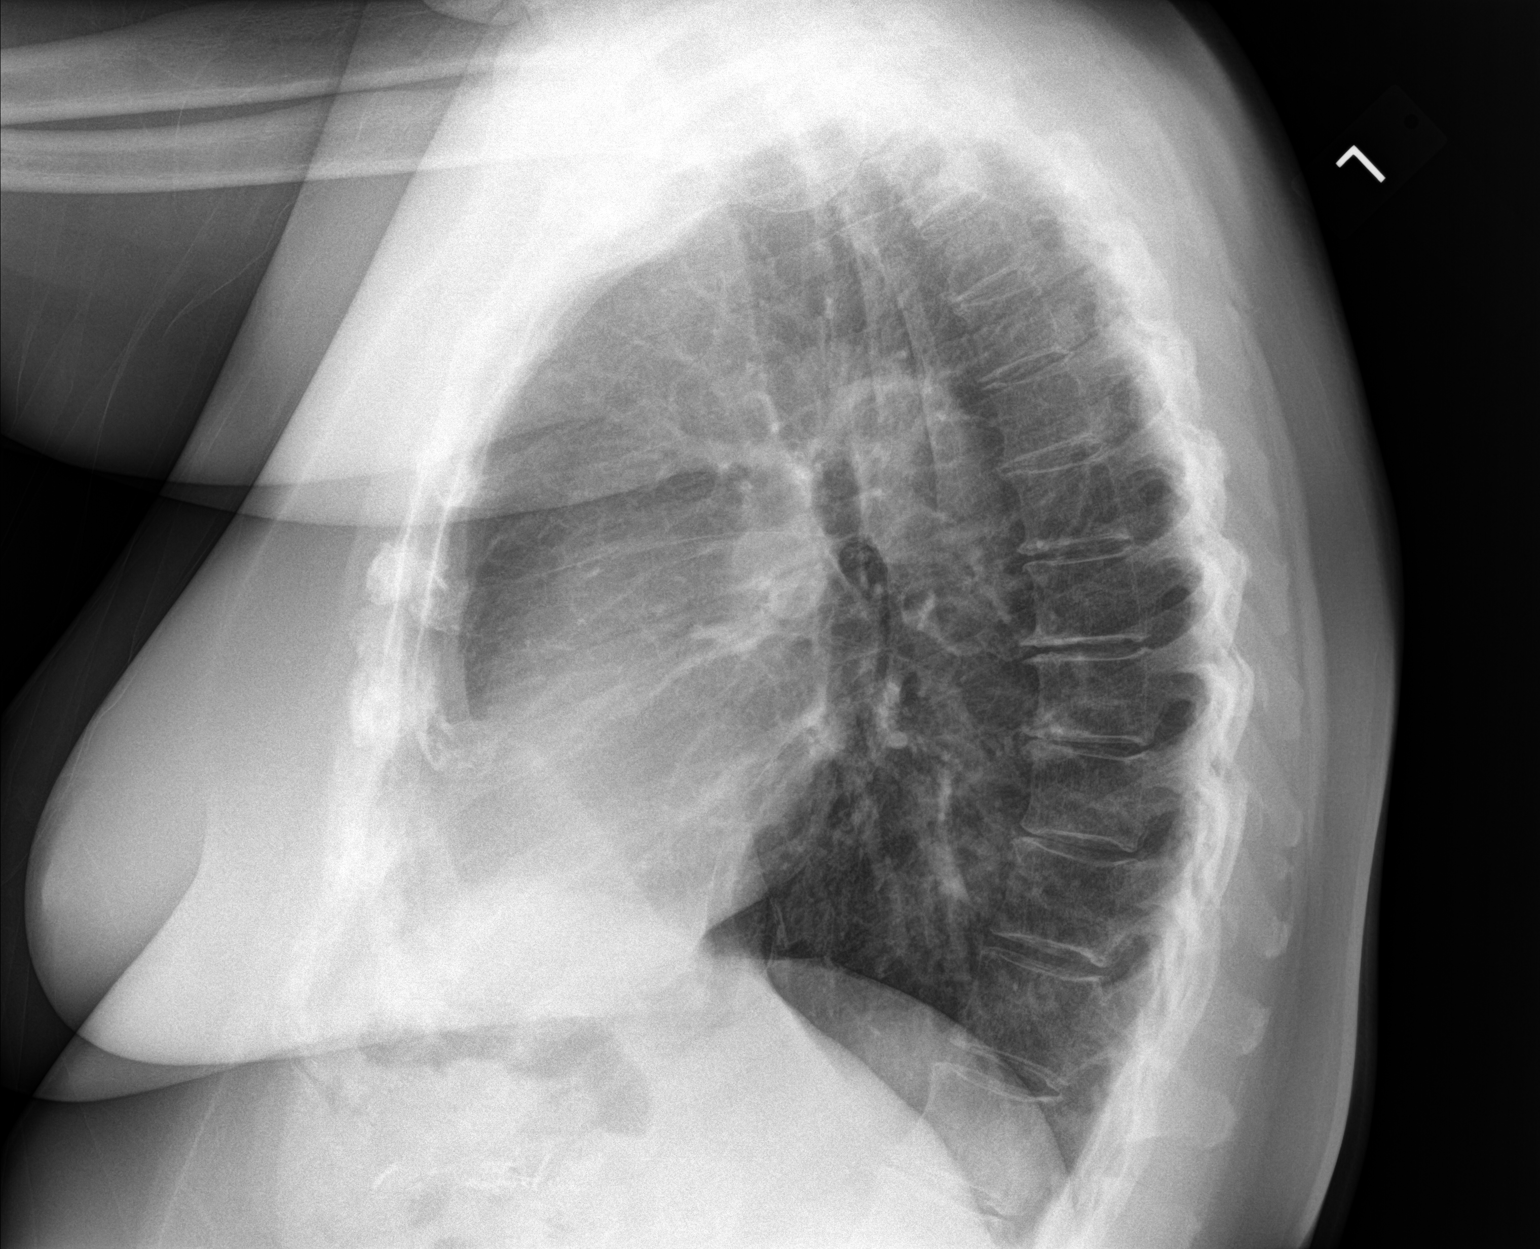

[2 of 2 positions shown; findings below may reference images not displayed]

FINDINGS: The lungs are clear. Heart size is upper normal. No pneumothorax or
pleural effusion. No acute disease
IMPRESSION: No acute disease.

## 2019-04-08 ENCOUNTER — Ambulatory Visit (INDEPENDENT_AMBULATORY_CARE_PROVIDER_SITE_OTHER): Payer: Medicare Other | Admitting: *Deleted

## 2019-04-08 DIAGNOSIS — Z0000 Encounter for general adult medical examination without abnormal findings: Secondary | ICD-10-CM | POA: Diagnosis not present

## 2019-04-08 NOTE — Progress Notes (Signed)
MEDICARE ANNUAL WELLNESS VISIT  04/08/2019  Telephone Visit Disclaimer This Medicare AWV was conducted by telephone due to national recommendations for restrictions regarding the COVID-19 Pandemic (e.g. social distancing).  I verified, using two identifiers, that I am speaking with Miranda Wilson or their authorized healthcare agent. I discussed the limitations, risks, security, and privacy concerns of performing an evaluation and management service by telephone and the potential availability of an in-person appointment in the future. The patient expressed understanding and agreed to proceed.   Subjective:  Miranda Wilson is a 68 y.o. female patient of Chipper Herb, MD who had a Medicare Annual Wellness Visit today via telephone. Miranda Wilson is Retired and lives with their spouse. Miranda Wilson has 1 child. Miranda Wilson reports that Miranda Wilson is socially active and does interact with friends/family regularly. Miranda Wilson is minimally physically active and enjoys gardening and word puzzles.  Patient Care Team: Chipper Herb, MD as PCP - General (Family Medicine) Chipper Herb, MD as Referring Physician (Family Medicine)  Advanced Directives 04/08/2019 12/20/2016 10/01/2016 02/27/2016 02/13/2016 02/13/2016 02/06/2016  Does Patient Have a Medical Advance Directive? No Yes Yes Yes Yes Yes Yes  Type of Advance Directive - [place];Living will [place];Living will [place];Living will [place];Living will [place];Living will [place];Living will  Does patient want to make changes to medical advance directive? - Yes (Inpatient - patient defers changing a medical advance directive at this time) - - No - Patient declined - -  Copy of [place] in Chart? - No - copy requested - - Yes Yes Yes  Would patient like information on creating a medical advance directive? No - Patient declined - - - - - [facility] Utilization Over the Past 12 Months: # of hospitalizations or ER visits: 0 # of surgeries: 1  Review of Systems    Patient reports that her overall health is better compared to last year.  Patient Reported Readings (BP, Pulse, CBG, Weight, etc) BP 136/67      P 48  Review of Systems: No complaints  All other systems negative.  Pain Assessment Pain : No/denies pain     Current Medications & Allergies (verified) Allergies as of 04/08/2019      Reactions   Clindamycin/lincomycin Other (See Comments)   Dizzy spells   Codeine Nausea And Vomiting   Crestor [rosuvastatin Calcium] Other (See Comments)   Aching flu like large dose    Lipitor [atorvastatin Calcium] Other (See Comments)   Numbness in feet    Pravachol Other (See Comments)   Back pain    Simvastatin Other (See Comments)   Back pain    Ultram [tramadol Hcl] Nausea And Vomiting   Vioxx [rofecoxib] Other (See Comments)   Fluid rentention , elevated blood pressure    Feldene [piroxicam] Rash   Myrbetriq [mirabegron] Other (See Comments)   Constipation, headache   Penicillins Rash   Pt states Miranda Wilson CAN take Keflex   Toviaz [fesoterodine Fumarate Er] Other (See Comments)   Dry skin, dry eyes, constipation      Medication List       Accurate as of April 08, 2019  9:15 AM. If you have any questions, ask your nurse or doctor.        STOP taking these medications   Aleve 220 MG Caps Generic drug:  Naproxen Sodium  TAKE these medications   albuterol 108 (90 Base) MCG/ACT inhaler Commonly known as:  VENTOLIN HFA Inhale 2 puffs into the lungs every 6 (six) hours as needed for wheezing or shortness of breath.   atenolol 50 MG tablet Commonly known as:  TENORMIN Take 1 tablet (50 mg total) by mouth daily. (Please make your follow-up appt)   CENTRUM SILVER PO Take 1 tablet by mouth daily.   cephALEXin 500 MG capsule Commonly known as:  KEFLEX TAKE 4 CAPSULES BY MOUTH 1 HOUR PRIOR TO DENTAL  APPOINTMENT   docusate sodium 100 MG capsule Commonly known as:  COLACE Take 100 mg by mouth daily as needed.   furosemide 20 MG tablet Commonly known as:  LASIX Take 1 tablet (20 mg total) by mouth daily.   levothyroxine 150 MCG tablet Commonly known as:  SYNTHROID TAKE 1 TABLET BY MOUTH EVERY DAY What changed:  additional instructions   nabumetone 500 MG tablet Commonly known as:  RELAFEN Take 2 tablets by mouth daily as needed.   rosuvastatin 40 MG tablet Commonly known as:  CRESTOR TAKE 1 TABLET BY MOUTH EVERY DAY AS DIRECTED   SYSTANE BALANCE OP Apply 1 drop to eye 2 (two) times daily as needed (Dry eyes). Reported on 01/31/2016   Vitamin D 50 MCG (2000 UT) Caps Take by mouth.       History (reviewed): Past Medical History:  Diagnosis Date  . At risk for sleep apnea   . Bilateral edema of lower extremity    left > right --  wear compression hose  . Cervical spondylosis   . Chronic constipation   . Cystitis [id number]'F   Dr Prince Rome - here   . GERD (gastroesophageal reflux disease)   . History of colon polyps   . History of idiopathic seizure    AGE 47 to 20  X5  --  UNKNOW IDIOLOGY , PER PT WAS ANEMIC AT THE TIME--  NONE SINCE  . History of tachycardia    S/P  RADIOACTIVE IODINE ABLATION OF THYROID  . Hyperlipidemia   . Hypertension   . Hypothyroidism, postradioiodine therapy    AGE 67  . IBS (irritable bowel syndrome)   . OA (osteoarthritis)    shoulders  left > right  . PMB (postmenopausal bleeding)   . S/P radioactive iodine thyroid ablation   . Urge urinary incontinence   . Varicose veins   . Vein disorder    LEFT ANKLE VEIN VALVE REFLUX WITH DECREASED CIRCULATION    Past Surgical History:  Procedure Laterality Date  . CATARACT EXTRACTION W/ INTRAOCULAR LENS  IMPLANT, BILATERAL  right 2011//  left 2013  . CESAREAN SECTION  1979  . CLOSED LEFT KNEE MANIPULATION AND RIGHT KNEE CORTISONE INJECTION  02-20-2010  . COLONOSCOPY W/ POLYPECTOMY   10-25-2008  . DILATION AND CURETTAGE OF UTERUS    . ENDOVENOUS ABLATION SAPHENOUS VEIN W/ LASER  04/2007  . EYE SURGERY Bilateral    cataract  . EYE SURGERY Bilateral    eyelid lifts /// left eye - placed removed  . HYSTEROSCOPY W/D&C N/A 11/11/2014   Procedure: DILATATION AND CURETTAGE /HYSTEROSCOPY;  Surgeon: Gus Height, MD;  [facility];  Service: Gynecology;  Laterality: N/A;  . JOINT REPLACEMENT    . LAPAROSCOPIC CHOLECYSTECTOMY  1993   and BILATERAL TUBAL LIGATION  . LEFT KNEE ARTHROTOMY W/ LYSIS ADHESIONS  12-01-2010  . MOUTH SURGERY     dental implants  . TONSILLECTOMY  1955  . TOTAL  KNEE ARTHROPLASTY Left 12-19-2009  . TOTAL KNEE ARTHROPLASTY Right 02/13/2016   Procedure: RIGHT TOTAL KNEE ARTHROPLASTY;  Surgeon: Gaynelle Arabian, MD;  Location: [facility];  Service: Orthopedics;  Laterality: Right;  . TUBAL LIGATION    . [place] to 2010   includes laser and phlebectomies   Family History  Problem Relation Age of Onset  . COPD Mother        smoked  . Parkinsonism Mother   . Arthritis Mother   . Epilepsy Mother        early in 58's  . Myasthenia gravis Father   . Asthma Father   . Emphysema Father        smoked  . Glaucoma Father   . Arthritis Brother        knees  . Heart disease Brother   . Diabetes Brother   . Hyperlipidemia Brother   . Hyperlipidemia Brother   . Hyperlipidemia Brother   . Heart disease Brother   . Hyperlipidemia Brother   . CAD Paternal Grandmother 77  . Epilepsy Daughter 110  . Kidney disease Maternal Uncle   . Colon cancer Neg Hx    Social History   Socioeconomic History  . Marital status: Married    Spouse name: Ronalee Belts  . Number of children: 1  . Years of education: 19  . Highest education level: Bachelor's degree (e.g., BA, AB, BS)  Occupational History  . Occupation: Retired     Comment: Tour manager  . Financial resource strain: Not hard at all  . Food insecurity:    Worry: Never  true    Inability: Never true  . Transportation needs:    Medical: No    Non-medical: No  Tobacco Use  . Smoking status: Never Smoker  . Smokeless tobacco: Never Used  Substance and Sexual Activity  . Alcohol use: No  . Drug use: No  . Sexual activity: Not Currently    Birth control/protection: None    Comment: not much  Lifestyle  . Physical activity:    Days per week: 3 days    Minutes per session: 20 min  . Stress: Not at all  Relationships  . Social connections:    Talks on phone: Three times a week    Gets together: Three times a week    Attends religious service: More than 4 times per year    Active member of club or organization: No    Attends meetings of clubs or organizations: Never    Relationship status: Married  Other Topics Concern  . Not on file  Social History Narrative   Lives with husband.      Activities of Daily Living In your present state of health, do you have any difficulty performing the following activities: 04/08/2019  Hearing? N  Vision? N  Difficulty concentrating or making decisions? Y  Comment Miranda Wilson did have some concerns that Miranda Wilson spoke with Dr Laurance Flatten about at her last visit regarding her memory but Miranda Wilson has been doing word puzzles and online "exercises to keep her memory [place]"  Walking or climbing stairs? Y  Comment pt is fine on the stairs as long as Miranda Wilson uses her cane  Dressing or bathing? N  Doing errands, shopping? N  Preparing Food and eating ? N  Using the Toilet? N  In the past six months, have you accidently leaked urine? Y  Comment Miranda Wilson wears pads all the time  Do you have problems with  loss of bowel control? N  Managing your Medications? N  Managing your Finances? N  Housekeeping or managing your Housekeeping? N  Some recent data might be hidden    Patient Literacy How often do you need to have someone help you when you read instructions, pamphlets, or other written materials from your doctor or pharmacy?: 1 - Never What is  the last grade level you completed in school?: Bachelors Degree-English  Exercise Current Exercise Habits: Home exercise routine, Type of exercise: Other - see comments(stationary bike, seated exercises, home Physical Therapy for her knee), Time (Minutes): 20, Frequency (Times/Week): 3, Weekly Exercise (Minutes/Week): 60, Intensity: Mild, Exercise limited by: orthopedic condition(s)  Diet Patient reports consuming 3 meals a day and 1 snack(s) a day Patient reports that her primary diet is: Regular Patient reports that Miranda Wilson does have regular access to food.   Depression Screen PHQ 2/9 Scores 04/08/2019 09/30/2018 06/09/2018 05/22/2018 02/24/2018 01/02/2018 12/13/2017  PHQ - 2 Score 0 0 0 0 0 0 0     Fall Risk Fall Risk  04/08/2019 09/30/2018 06/09/2018 05/22/2018 02/24/2018  Falls in the past year? 0 0 No No No  Number falls in past yr: - - - - -  Injury with Fall? - - - - -  Risk for fall due to : - - - - -     Objective:  Miranda Wilson seemed alert and oriented and Miranda Wilson participated appropriately during our telephone visit.  Blood Pressure Weight BMI  BP Readings from Last 3 Encounters:  09/30/18 (!) 150/90  06/09/18 129/68  05/22/18 135/64   Wt Readings from Last 3 Encounters:  09/30/18 198 lb (89.8 kg)  06/09/18 205 lb (93 kg)  05/22/18 204 lb (92.5 kg)   BMI Readings from Last 1 Encounters:  09/30/18 35.07 kg/m    *Unable to obtain current vital signs, weight, and BMI due to telephone visit type  Hearing/Vision  . [place] did not seem to have difficulty with hearing/understanding during the telephone conversation . Reports that Miranda Wilson has not had a formal eye exam by an eye care professional within the past year . Reports that Miranda Wilson has not had a formal hearing evaluation within the past year *Unable to fully assess hearing and vision during telephone visit type  Cognitive Function: 6CIT Screen 04/08/2019  What Year? 0 points  What month? 0 points  What time? 0 points  Count back  from 20 0 points  Months in reverse 0 points  Repeat phrase 0 points  Total Score 0    Normal Cognitive Function Screening: Normal (Normal:0-7, Significant for Dysfunction: >8)  Immunization & Health Maintenance Record Immunization History  Administered Date(s) Administered  . Influenza, High Dose Seasonal PF 07/18/2016, 08/15/2017, 09/21/2018  . Influenza,inj,Quad PF,6+ Mos 08/18/2015  . Pneumococcal Conjugate-13 02/03/2015  . Pneumococcal Polysaccharide-23 11/23/2016  . Td 11/12/2011  . Tdap 11/12/2011    Health Maintenance  Topic Date Due  . MAMMOGRAM  07/26/2018  . Hepatitis C Screening  05/23/2019 (Originally Mar 28, 1951)  . INFLUENZA VACCINE  05/30/2019  . TETANUS/TDAP  11/11/2021  . COLONOSCOPY  10/11/2026  . DEXA SCAN  Completed  . PNA vac Low Risk Adult  Completed       Assessment  This is a routine wellness examination for Miranda Wilson.  Health Maintenance: Due or Overdue Health Maintenance Due  Topic Date Due  . MAMMOGRAM  07/26/2018    Miranda Wilson does not need a referral for Community Assistance: Care Management:  no Social Work:    no Prescription Assistance:  no Nutrition/Diabetes Education:  no   Plan:  Personalized Goals Goals Addressed            This Visit's Progress   . Patient Stated (pt-stated)       " I want to work on keeping my mind [place] by doing word puzzles"      Personalized Health Maintenance & Screening Recommendations  Shingles vaccine and Mammogram  Lung Cancer Screening Recommended: no (Low Dose CT Chest recommended if Age 55-80 years, 30 pack-year currently smoking OR have quit w/in past 15 years) Hepatitis C Screening recommended: no HIV Screening recommended: no  Advanced Directives: Written information was not prepared per patient's request.  Referrals & Orders No orders of the defined types were placed in this encounter.   Follow-up Plan . Follow-up with Chipper Herb, MD as planned . Schedule  Screening Mammogram as soon as they are taking patients . Consider Shingles Vaccine at your next visit with your PCP   I have personally reviewed and noted the following in the patient's chart:   . Medical and social history . Use of alcohol, tobacco or illicit drugs  . Current medications and supplements . Functional ability and status . Nutritional status . Physical activity . Advanced directives . List of other physicians . Hospitalizations, surgeries, and ER visits in previous 12 months . Vitals . Screenings to include cognitive, depression, and falls . Referrals and appointments  In addition, I have reviewed and discussed with Miranda Wilson certain preventive protocols, quality metrics, and best practice recommendations. A written personalized care plan for preventive services as well as general preventive health recommendations is available and can be mailed to the patient at her request.      Marylin Crosby  04/08/2019

## 2019-04-08 NOTE — Patient Instructions (Signed)
Preventive Care 70 Years and Older, Female Preventive care refers to lifestyle choices and visits with your health care provider that can promote health and wellness. What does preventive care include?  A yearly physical exam. This is also called an annual well check.  Dental exams once or twice a year.  Routine eye exams. Ask your health care provider how often you should have your eyes checked.  Personal lifestyle choices, including: ? Daily care of your teeth and gums. ? Regular physical activity. ? Eating a healthy diet. ? Avoiding tobacco and drug use. ? Limiting alcohol use. ? Practicing safe sex. ? Taking low-dose aspirin every day. ? Taking vitamin and mineral supplements as recommended by your health care provider. What happens during an annual well check? The services and screenings done by your health care provider during your annual well check will depend on your age, overall health, lifestyle risk factors, and family history of disease. Counseling Your health care provider may ask you questions about your:  Alcohol use.  Tobacco use.  Drug use.  Emotional well-being.  Home and relationship well-being.  Sexual activity.  Eating habits.  History of falls.  Memory and ability to understand (cognition).  Work and work Statistician.  Reproductive health.  Screening You may have the following tests or measurements:  Height, weight, and BMI.  Blood pressure.  Lipid and cholesterol levels. These may be checked every 5 years, or more frequently if you are over 30 years old.  Skin check.  Lung cancer screening. You may have this screening every year starting at age 27 if you have a 30-pack-year history of smoking and currently smoke or have quit within the past 15 years.  Colorectal cancer screening. All adults should have this screening starting at age 33 and continuing until age 46. You will have tests every 1-10 years, depending on your results and the  type of screening test. People at increased risk should start screening at an earlier age. Screening tests may include: ? Guaiac-based fecal occult blood testing. ? Fecal immunochemical test (FIT). ? Stool DNA test. ? Virtual colonoscopy. ? Sigmoidoscopy. During this test, a flexible tube with a tiny camera (sigmoidoscope) is used to examine your rectum and lower colon. The sigmoidoscope is inserted through your anus into your rectum and lower colon. ? Colonoscopy. During this test, a long, thin, flexible tube with a tiny camera (colonoscope) is used to examine your entire colon and rectum.  Hepatitis C blood test.  Hepatitis B blood test.  Sexually transmitted disease (STD) testing.  Diabetes screening. This is done by checking your blood sugar (glucose) after you have not eaten for a while (fasting). You may have this done every 1-3 years.  Bone density scan. This is done to screen for osteoporosis. You may have this done starting at age 37.  Mammogram. This may be done every 1-2 years. Talk to your health care provider about how often you should have regular mammograms. Talk with your health care provider about your test results, treatment options, and if necessary, the need for more tests. Vaccines Your health care provider may recommend certain vaccines, such as:  Influenza vaccine. This is recommended every year.  Tetanus, diphtheria, and acellular pertussis (Tdap, Td) vaccine. You may need a Td booster every 10 years.  Varicella vaccine. You may need this if you have not been vaccinated.  Zoster vaccine. You may need this after age 38.  Measles, mumps, and rubella (MMR) vaccine. You may need at least  one dose of MMR if you were born in 1957 or later. You may also need a second dose.  Pneumococcal 13-valent conjugate (PCV13) vaccine. One dose is recommended after age 24.  Pneumococcal polysaccharide (PPSV23) vaccine. One dose is recommended after age 24.  Meningococcal  vaccine. You may need this if you have certain conditions.  Hepatitis A vaccine. You may need this if you have certain conditions or if you travel or work in places where you may be exposed to hepatitis A.  Hepatitis B vaccine. You may need this if you have certain conditions or if you travel or work in places where you may be exposed to hepatitis B.  Haemophilus influenzae type b (Hib) vaccine. You may need this if you have certain conditions. Talk to your health care provider about which screenings and vaccines you need and how often you need them. This information is not intended to replace advice given to you by your health care provider. Make sure you discuss any questions you have with your health care provider. Document Released: 11/11/2015 Document Revised: 12/05/2017 Document Reviewed: 08/16/2015 Elsevier Interactive Patient Education  2019 [employer].

## 2019-05-31 ENCOUNTER — Other Ambulatory Visit: Payer: Self-pay | Admitting: Family Medicine

## 2019-06-03 ENCOUNTER — Other Ambulatory Visit: Payer: Self-pay | Admitting: Family Medicine

## 2019-06-19 ENCOUNTER — Other Ambulatory Visit: Payer: Self-pay | Admitting: Family Medicine

## 2019-12-14 ENCOUNTER — Encounter: Payer: Self-pay | Admitting: Family Medicine

## 2019-12-14 ENCOUNTER — Ambulatory Visit (INDEPENDENT_AMBULATORY_CARE_PROVIDER_SITE_OTHER): Payer: Medicare PPO | Admitting: Family Medicine

## 2019-12-14 VITALS — BP 132/74

## 2019-12-14 DIAGNOSIS — E038 Other specified hypothyroidism: Secondary | ICD-10-CM

## 2019-12-14 DIAGNOSIS — I10 Essential (primary) hypertension: Secondary | ICD-10-CM | POA: Diagnosis not present

## 2019-12-14 DIAGNOSIS — E559 Vitamin D deficiency, unspecified: Secondary | ICD-10-CM

## 2019-12-14 DIAGNOSIS — E7800 Pure hypercholesterolemia, unspecified: Secondary | ICD-10-CM

## 2019-12-14 DIAGNOSIS — R110 Nausea: Secondary | ICD-10-CM

## 2019-12-14 DIAGNOSIS — E538 Deficiency of other specified B group vitamins: Secondary | ICD-10-CM

## 2019-12-14 DIAGNOSIS — B373 Candidiasis of vulva and vagina: Secondary | ICD-10-CM | POA: Diagnosis not present

## 2019-12-14 DIAGNOSIS — B3731 Acute candidiasis of vulva and vagina: Secondary | ICD-10-CM

## 2019-12-14 DIAGNOSIS — R42 Dizziness and giddiness: Secondary | ICD-10-CM

## 2019-12-14 MED ORDER — ATENOLOL 50 MG PO TABS: 50.0000 mg | ORAL_TABLET | Freq: Every day | ORAL | 0 refills | Status: DC

## 2019-12-14 MED ORDER — ONDANSETRON 4 MG PO TBDP: 4.0000 mg | ORAL_TABLET | Freq: Three times a day (TID) | ORAL | 0 refills | Status: DC | PRN

## 2019-12-14 MED ORDER — FLUCONAZOLE 150 MG PO TABS: 150.0000 mg | ORAL_TABLET | Freq: Once | ORAL | 0 refills | Status: AC

## 2019-12-14 NOTE — Progress Notes (Signed)
Telephone visit  Subjective: CC: dizziness, ?yeast infection PCP: [name], DO LOV:FIEPPIR M Mickle is a 69 y.o. female calls for telephone consult today. Patient provides verbal consent for consult held via phone.  Due to COVID-19 pandemic this visit was conducted virtually. This visit type was conducted due to national recommendations for restrictions regarding the COVID-19 Pandemic (e.g. social distancing, sheltering in place) in an effort to limit this patient's exposure and mitigate transmission in our community. All issues noted in this document were discussed and addressed.  A physical exam was not performed with this format.   Location of patient: home Location of provider: Working remotely from home Others present for call: none  1. Yeast infection Patient saw her dentist last week and she was placed on antibiotics for an infection in her gums.  She developed dizziness and nausea as well, which is typical.  She notes she has developed a vaginal yeast infection as a result of the antibiotics.  2.  Hypertension Patient reports compliance with 1/2 tablet of atenolol 50 mg daily.  She notes that her heart rate was dropping into the 40s with the 50 mg daily and therefore this is reduced.  She only takes the Lasix in the summer months for edema.  She has not needed this in quite some time.  Blood pressures have been controlled at home.  She does not report any chest pain, shortness of breath.  She had some dizziness as above but that has since resolved. ROS: Per HPI  Allergies  Allergen Reactions  . Clindamycin/Lincomycin Other (See Comments)    Dizzy spells  . Codeine Nausea And Vomiting  . Crestor [Rosuvastatin Calcium] Other (See Comments)    Aching flu like large dose   . Lipitor [Atorvastatin Calcium] Other (See Comments)    Numbness in feet   . Pravachol Other (See Comments)    Back pain    . Simvastatin Other (See Comments)    Back pain    . Ultram [Tramadol  Hcl] Nausea And Vomiting  . Vioxx [Rofecoxib] Other (See Comments)    Fluid rentention , elevated blood pressure   . Feldene [Piroxicam] Rash  . Myrbetriq [Mirabegron] Other (See Comments)    Constipation, headache  . Penicillins Rash    Pt states she CAN take Keflex  . Toviaz [Fesoterodine Fumarate Er] Other (See Comments)    Dry skin, dry eyes, constipation   Past Medical History:  Diagnosis Date  . At risk for sleep apnea   . Bilateral edema of lower extremity    left > right --  wear compression hose  . Cervical spondylosis   . Chronic constipation   . Cystitis 5188-4166'A   Dr Prince Rome - here   . GERD (gastroesophageal reflux disease)   . History of colon polyps   . History of idiopathic seizure    AGE 26 to 20  X5  --  UNKNOW IDIOLOGY , PER PT WAS ANEMIC AT THE TIME--  NONE SINCE  . History of tachycardia    S/P  RADIOACTIVE IODINE ABLATION OF THYROID  . Hyperlipidemia   . Hypertension   . Hypothyroidism, postradioiodine therapy    AGE 68  . IBS (irritable bowel syndrome)   . OA (osteoarthritis)    shoulders  left > right  . PMB (postmenopausal bleeding)   . S/P radioactive iodine thyroid ablation   . Urge urinary incontinence   . Varicose veins   . Vein disorder    LEFT ANKLE VEIN  VALVE REFLUX WITH DECREASED CIRCULATION     Current Outpatient Medications:  .  albuterol (PROVENTIL HFA;VENTOLIN HFA) 108 (90 Base) MCG/ACT inhaler, Inhale 2 puffs into the lungs every 6 (six) hours as needed for wheezing or shortness of breath., Disp: 1 Inhaler, Rfl: 11 .  atenolol (TENORMIN) 50 MG tablet, Take 1 tablet (50 mg total) by mouth daily. (Needs to be seen before next refill), Disp: 30 tablet, Rfl: 0 .  cephALEXin (KEFLEX) 500 MG capsule, TAKE 4 CAPSULES BY MOUTH 1 HOUR PRIOR TO DENTAL APPOINTMENT, Disp: , Rfl:  .  Cholecalciferol (VITAMIN D) 2000 units CAPS, Take by mouth., Disp: , Rfl:  .  docusate sodium (COLACE) 100 MG capsule, Take 100 mg by mouth daily as needed. , Disp:  , Rfl:  .  furosemide (LASIX) 20 MG tablet, Take 1 tablet (20 mg total) by mouth daily., Disp: 30 tablet, Rfl: 3 .  levothyroxine (SYNTHROID) 150 MCG tablet, TAKE 1/2 TABLET BY MOUTH EVERY DAY except 1 tab on mon, wed, fri. Needs to be seen for further refills, Disp: 90 tablet, Rfl: 0 .  Multiple Vitamins-Minerals (CENTRUM SILVER PO), Take 1 tablet by mouth daily., Disp: , Rfl:  .  nabumetone (RELAFEN) 500 MG tablet, Take 2 tablets by mouth daily as needed., Disp: , Rfl: 1 .  Propylene Glycol (SYSTANE BALANCE OP), Apply 1 drop to eye 2 (two) times daily as needed (Dry eyes). Reported on 01/31/2016, Disp: , Rfl:  .  rosuvastatin (CRESTOR) 40 MG tablet, Take 1 tablet (40 mg total) by mouth daily., Disp: 30 tablet, Rfl: 4  Assessment/ Plan: 69 y.o. female   1. Yeast vaginitis Treat with diflucan - fluconazole (DIFLUCAN) 150 MG tablet; Take 1 tablet (150 mg total) by mouth once for 1 dose. May repeat dose in 3 days if needed  Dispense: 2 tablet; Refill: 0  2. Nausea Improving but not resolved - ondansetron (ZOFRAN ODT) 4 MG disintegrating tablet; Take 1 tablet (4 mg total) by mouth every 8 (eight) hours as needed for nausea or vomiting.  Dispense: 20 tablet; Refill: 0  3. Dizziness Resolved.  4. Essential hypertension, benign Controlled.  Continue current regimen  5. Vitamin D deficiency Plan for laboratory check at next visit.  This was not discussed in detail - VITAMIN D 25 Hydroxy (Vit-D Deficiency, Fractures); Future  6. Pure hypercholesterolemia - Lipid panel; Future - CMP14+EGFR; Future  7. Vitamin B 12 deficiency - Vitamin B12; Future  8. Other specified hypothyroidism - Thyroid Panel With TSH; Future   She has follow up scheduled in March.  She will contact me prior to that appointment if symptoms are not resolving or if they worsen.  Start time: 1:49pm End time: 2:00pm  Total time spent on patient care (including telephone call/ virtual visit): 20 minutes  [place], [place] [phone]

## 2020-01-01 ENCOUNTER — Other Ambulatory Visit: Payer: Self-pay

## 2020-01-04 ENCOUNTER — Other Ambulatory Visit: Payer: Self-pay

## 2020-01-04 ENCOUNTER — Encounter: Payer: Self-pay | Admitting: Family Medicine

## 2020-01-04 ENCOUNTER — Ambulatory Visit: Payer: Medicare PPO | Admitting: Family Medicine

## 2020-01-04 VITALS — BP 138/79 | HR 46 | Temp 98.0°F | Ht 61.0 in | Wt 197.0 lb

## 2020-01-04 DIAGNOSIS — R001 Bradycardia, unspecified: Secondary | ICD-10-CM

## 2020-01-04 DIAGNOSIS — E538 Deficiency of other specified B group vitamins: Secondary | ICD-10-CM

## 2020-01-04 DIAGNOSIS — N3945 Continuous leakage: Secondary | ICD-10-CM

## 2020-01-04 DIAGNOSIS — I872 Venous insufficiency (chronic) (peripheral): Secondary | ICD-10-CM

## 2020-01-04 DIAGNOSIS — E7800 Pure hypercholesterolemia, unspecified: Secondary | ICD-10-CM

## 2020-01-04 DIAGNOSIS — E890 Postprocedural hypothyroidism: Secondary | ICD-10-CM

## 2020-01-04 DIAGNOSIS — R413 Other amnesia: Secondary | ICD-10-CM

## 2020-01-04 DIAGNOSIS — I10 Essential (primary) hypertension: Secondary | ICD-10-CM | POA: Diagnosis not present

## 2020-01-04 DIAGNOSIS — E559 Vitamin D deficiency, unspecified: Secondary | ICD-10-CM

## 2020-01-04 DIAGNOSIS — R42 Dizziness and giddiness: Secondary | ICD-10-CM

## 2020-01-04 LAB — BAYER DCA HB A1C WAIVED: HB A1C (BAYER DCA - WAIVED): 5.7 % (ref <7.0)

## 2020-01-04 MED ORDER — LOSARTAN POTASSIUM 25 MG PO TABS: 25.0000 mg | ORAL_TABLET | Freq: Every day | ORAL | 1 refills | Status: DC

## 2020-01-04 MED ORDER — ROSUVASTATIN CALCIUM 40 MG PO TABS: 40.0000 mg | ORAL_TABLET | Freq: Every day | ORAL | 4 refills | Status: DC

## 2020-01-04 NOTE — Patient Instructions (Addendum)
Take 1/2 tablet of the Atenolol for the next 2-3 days.  Then take 1/2 tablet every other day for 2-3 days. Then stop.  Start Losartan 25mg  daily.  Keep an eye on heart rate and blood pressure.  You will come in for repeat blood pressure check and kidney function check (DO NOT have to be fasting for that lab) in 1 week with nurse.  Call me if you want to see Dr Jeffie Pollock again.  Schedule your mammogram when you check out.  I have placed a referral to Dr Kellie Simmering.  You had labs performed today.  You will be contacted with the results of the labs once they are available, usually in the next 3 business days for routine lab work.  If you have an active my chart account, they will be released to your MyChart.  If you prefer to have these labs released to you via telephone, please let us know.  If you had a pap smear or biopsy performed, expect to be contacted in about 7-10 days.

## 2020-01-04 NOTE — Progress Notes (Signed)
Subjective: CC: Multiple concerns PCP: [name], DO IPJ:ASNKNLZ REGANNE MESSERSCHMIDT is a 69 y.o. female presenting to clinic today for:  1.  Hypertension with hyperlipidemia with morbid obesity Patient reports compliance with 1/2 tablet of atenolol twice daily and Crestor 40 mg daily.  At her last visit in 2019 with her previous PCP, there was discussion of potentially discontinuing the beta-blocker due to bradycardia.  At that time, the decision to reduce dose to 25 mg twice daily was made.  She has ongoing low heart rates anywhere into the 40s and 50s at home.  She brings in a blood pressure log which shows her blood pressures are fluctuating from systolics of low 767H to as high as 150s.  The 150s are outliers.  Diastolic blood pressures are within normal range.  Does not report any chest pain, shortness of breath, visual disturbance.  She has chronic lower extremity edema with left greater than right.  She has been seen by Dr. Tinnie Gens in the past, has had surgery in the left lower extremity previously.  She notes a vascular issue in the LLE.  Surgery was considered previously but she notes the issue would have recurred so this was not pursued.  She has as needed Lasix to use during the summertime for swelling as not controlled by compression hose, which she uses daily.    She has a strong family history of type 2 diabetes in her family, citing that 4 out of her 5 brothers have type 2 diabetes.  She would like to be checked for this today as she does have intermittent peripheral neuropathy in the lower extremities.  2.  Hypothyroidism Patient has history of goiter and is status post radioablation.  She has been compliant with her Synthroid 150 mcg daily.  She does report hair thinning and has been considering Rogaine but was worried about the possible effects on blood pressure using a topical product.  She wants my opinion on this.  Does not report any change in bowel habits, weight.  3.   Memory concern Patient reports that she has been having intermittent memory issues over the last year or so.  She has difficulty with word finding sometimes and forgets names of people, specifically celebrities.  She does not forget names of loved ones.  She is never gotten lost.  She is never had issues mismanaging finances.  She does have bladder incontinence at baseline that has been refractory to use of Myrbetriq, Toviaz, etc.  She was seen by Dr. Jeffie Pollock previously for this but has not followed up in some time.  The last discussion was possibly pursuing biofeedback/nerve stimulation.  This was found to be expensive and therefore she never proceeded.  Denies any bowel incontinence     ROS: Per HPI  Allergies  Allergen Reactions  . Clindamycin/Lincomycin Other (See Comments)    Dizzy spells  . Codeine Nausea And Vomiting  . Crestor [Rosuvastatin Calcium] Other (See Comments)    Aching flu like large dose   . Lipitor [Atorvastatin Calcium] Other (See Comments)    Numbness in feet   . Pravachol Other (See Comments)    Back pain    . Simvastatin Other (See Comments)    Back pain    . Ultram [Tramadol Hcl] Nausea And Vomiting  . Vioxx [Rofecoxib] Other (See Comments)    Fluid rentention , elevated blood pressure   . Feldene [Piroxicam] Rash  . Myrbetriq [Mirabegron] Other (See Comments)    Constipation, headache  .  Penicillins Rash    Pt states she CAN take Keflex  . Toviaz [Fesoterodine Fumarate Er] Other (See Comments)    Dry skin, dry eyes, constipation   Past Medical History:  Diagnosis Date  . At risk for sleep apnea   . Bilateral edema of lower extremity    left > right --  wear compression hose  . Cervical spondylosis   . Chronic constipation   . Cystitis 7829-5621'H   Dr Prince Rome - here   . GERD (gastroesophageal reflux disease)   . History of colon polyps   . History of idiopathic seizure    AGE 20 to 20  X5  --  UNKNOW IDIOLOGY , PER PT WAS ANEMIC AT THE TIME--   NONE SINCE  . History of tachycardia    S/P  RADIOACTIVE IODINE ABLATION OF THYROID  . Hyperlipidemia   . Hypertension   . Hypothyroidism, postradioiodine therapy    AGE 8  . IBS (irritable bowel syndrome)   . OA (osteoarthritis)    shoulders  left > right  . PMB (postmenopausal bleeding)   . S/P radioactive iodine thyroid ablation   . Urge urinary incontinence   . Varicose veins   . Vein disorder    LEFT ANKLE VEIN VALVE REFLUX WITH DECREASED CIRCULATION     Current Outpatient Medications:  .  atenolol (TENORMIN) 50 MG tablet, Take 1 tablet (50 mg total) by mouth daily. (Needs to be seen before next refill), Disp: 30 tablet, Rfl: 0 .  Cholecalciferol (VITAMIN D) 2000 units CAPS, Take by mouth., Disp: , Rfl:  .  levothyroxine (SYNTHROID) 150 MCG tablet, TAKE 1/2 TABLET BY MOUTH EVERY DAY except 1 tab on mon, wed, fri. Needs to be seen for further refills, Disp: 90 tablet, Rfl: 0 .  Multiple Vitamins-Minerals (CENTRUM SILVER PO), Take 1 tablet by mouth daily., Disp: , Rfl:  .  rosuvastatin (CRESTOR) 40 MG tablet, Take 1 tablet (40 mg total) by mouth daily., Disp: 30 tablet, Rfl: 4 .  Triamcinolone Acetonide (TRIAMCINOLONE 0.1 % CREAM : EUCERIN) CREA, Apply 1 application topically 2 (two) times daily., Disp: , Rfl:  .  docusate sodium (COLACE) 100 MG capsule, Take 100 mg by mouth daily as needed. , Disp: , Rfl:  .  nabumetone (RELAFEN) 500 MG tablet, Take 2 tablets by mouth daily as needed., Disp: , Rfl: 1 .  ondansetron (ZOFRAN ODT) 4 MG disintegrating tablet, Take 1 tablet (4 mg total) by mouth every 8 (eight) hours as needed for nausea or vomiting. (Patient not taking: Reported on 01/04/2020), Disp: 20 tablet, Rfl: 0 .  Propylene Glycol (SYSTANE BALANCE OP), Apply 1 drop to eye 2 (two) times daily as needed (Dry eyes). Reported on 01/31/2016, Disp: , Rfl:  Social History   Socioeconomic History  . Marital status: Married    Spouse name: Ronalee Belts  . Number of children: 1  . Years of  education: 61  . Highest education level: Bachelor's degree (e.g., BA, AB, BS)  Occupational History  . Occupation: Retired     Comment: Pharmacist, hospital  Tobacco Use  . Smoking status: Never Smoker  . Smokeless tobacco: Never Used  Substance and Sexual Activity  . Alcohol use: No  . Drug use: No  . Sexual activity: Not Currently    Birth control/protection: None    Comment: not much  Other Topics Concern  . Not on file  Social History Narrative   Lives with husband.     Social Determinants of Health  Financial Resource Strain: Low Risk   . Difficulty of Paying Living Expenses: Not hard at all  Food Insecurity: No Food Insecurity  . Worried About Charity fundraiser in the Last Year: Never true  . Ran Out of Food in the Last Year: Never true  Transportation Needs: No Transportation Needs  . Lack of Transportation (Medical): No  . Lack of Transportation (Non-Medical): No  Physical Activity: Insufficiently Active  . Days of Exercise per Week: 3 days  . Minutes of Exercise per Session: 20 min  Stress: No Stress Concern Present  . Feeling of Stress : Not at all  Social Connections: Slightly Isolated  . Frequency of Communication with Friends and Family: Three times a week  . Frequency of Social Gatherings with Friends and Family: Three times a week  . Attends Religious Services: More than 4 times per year  . Active Member of Clubs or Organizations: No  . Attends Archivist Meetings: Never  . Marital Status: Married  Human resources officer Violence: Not At Risk  . Fear of Current or Ex-Partner: No  . Emotionally Abused: No  . Physically Abused: No  . Sexually Abused: No   Family History  Problem Relation Age of Onset  . COPD Mother        smoked  . Parkinsonism Mother   . Arthritis Mother   . Epilepsy Mother        early in 31's  . Myasthenia gravis Father   . Asthma Father   . Emphysema Father        smoked  . Glaucoma Father   . Arthritis Brother        knees  .  Heart disease Brother   . Diabetes Brother   . Hyperlipidemia Brother   . Hyperlipidemia Brother   . Hyperlipidemia Brother   . Heart disease Brother   . Hyperlipidemia Brother   . CAD Paternal Grandmother 54  . Epilepsy Daughter 14  . Kidney disease Maternal Uncle   . Colon cancer Neg Hx     Objective: Office vital signs reviewed. BP 138/79   Pulse (!) 46   Temp 98 F (36.7 C) (Temporal)   Ht '5\' 1"'  (1.549 m)   Wt 197 lb (89.4 kg)   LMP 04/29/1991   SpO2 100%   BMI 37.22 kg/m   Physical Examination:  General: Awake, alert, obese nourished, No acute distress HEENT: Normal, sclera white, MMM.  No carotid bruits Cardio: regular rate and rhythm, S1S2 heard, no murmurs appreciated Pulm: clear to auscultation bilaterally, no wheezes, rhonchi or rales; normal work of breathing on room air Extremities: warm, well perfused, 1+ pitting LE edema, no cyanosis or clubbing; +1 pulses bilaterally MSK: slow gait Skin: dry; intact; she has hyperpigmentation noted along the medial lower leg.  No skin breakdown. Neuro: Alert and oriented. Psych: Mood stable, speech normal, affect appropriate, pleasant and interactive  MMSE - Mini Mental State Exam 09/30/2018 12/20/2016  Orientation to time 5 5  Orientation to Place 5 5  Registration 3 3  Attention/ Calculation 5 5  Recall 3 3  Language- name 2 objects 2 2  Language- repeat 1 1  Language- follow 3 step command 3 3  Language- read & follow direction 1 1  Write a sentence 1 1  Copy design 1 1  Total score 30 30   Assessment/ Plan: 69 y.o. female   1. Essential hypertension, benign Controlled.  However, she is bradycardic so I favor discontinuing the  beta-blocker.  We discussed taper off of the beta-blocker so as not to precipitate angina.  We will plan to initiate Cozaar 25 mg daily.  She will return in 1 week for repeat blood pressure check and BMP with the nurse. - losartan (COZAAR) 25 MG tablet; Take 1 tablet (25 mg total) by mouth  daily.  Dispense: 90 tablet; Refill: 1 - BMP, future  2. Bradycardia Taper from beta-blocker as above  3. Postablative hypothyroidism Asymptomatic.  Check thyroid panel - Thyroid Panel With TSH  4. Morbid obesity ([place]) Check A1c given family history of diabetes - Bayer DCA Hb A1c Waived  5. Memory problem MMSE score was 30 out of 30.  Memory loss does not appear to be related to an underlying dementia.  I think that her intermittent forgetfulness is normal  6. Pure hypercholesterolemia Check fasting lipid panel - CMP14+EGFR - Lipid panel - rosuvastatin (CRESTOR) 40 MG tablet; Take 1 tablet (40 mg total) by mouth daily.  Dispense: 30 tablet; Refill: 4  7. Vitamin D deficiency - VITAMIN D 25 Hydroxy (Vit-D Deficiency, Fractures)  8. Vitamin B 12 deficiency - Vitamin B12  9. Dizziness - CBC  10. Continuous leakage of urine Urinary incontinence refractory to various prescriptions.  May want to see Dr. Jeffie Pollock again.  She will contact me later about this.  11. Venous insufficiency of both lower extremities I come through her records and could not find the actual disorder that she has in the left lower extremity.  Will refer back to Dr. Tinnie Gens per her request. - Ambulatory referral to Vascular Surgery    No orders of the defined types were placed in this encounter.  No orders of the defined types were placed in this encounter.    [name], DO [place] [phone]

## 2020-01-05 LAB — THYROID PANEL WITH TSH
Free Thyroxine Index: 3.5 (ref 1.2–4.9)
T3 Uptake Ratio: 30 % (ref 24–39)
T4, Total: 11.8 ug/dL (ref 4.5–12.0)
TSH: 0.368 u[IU]/mL — ABNORMAL LOW (ref 0.450–4.500)

## 2020-01-05 LAB — CMP14+EGFR
ALT: 13 IU/L (ref 0–32)
AST: 24 IU/L (ref 0–40)
Albumin/Globulin Ratio: 2 (ref 1.2–2.2)
Albumin: 4.4 g/dL (ref 3.8–4.8)
Alkaline Phosphatase: 110 IU/L (ref 39–117)
BUN/Creatinine Ratio: 13 (ref 12–28)
BUN: 14 mg/dL (ref 8–27)
Bilirubin Total: 0.5 mg/dL (ref 0.0–1.2)
CO2: 21 mmol/L (ref 20–29)
Calcium: 9.3 mg/dL (ref 8.7–10.3)
Chloride: 104 mmol/L (ref 96–106)
Creatinine, Ser: 1.08 mg/dL — ABNORMAL HIGH (ref 0.57–1.00)
GFR calc Af Amer: 60 mL/min/{1.73_m2} (ref >59)
GFR calc non Af Amer: 52 mL/min/{1.73_m2} — ABNORMAL LOW (ref >59)
Globulin, Total: 2.2 g/dL (ref 1.5–4.5)
Glucose: 84 mg/dL (ref 65–99)
Potassium: 4.1 mmol/L (ref 3.5–5.2)
Sodium: 139 mmol/L (ref 134–144)
Total Protein: 6.6 g/dL (ref 6.0–8.5)

## 2020-01-05 LAB — LIPID PANEL
Chol/HDL Ratio: 2.7 ratio (ref 0.0–4.4)
Cholesterol, Total: 167 mg/dL (ref 100–199)
HDL: 63 mg/dL (ref >39)
LDL Chol Calc (NIH): 87 mg/dL (ref 0–99)
Triglycerides: 90 mg/dL (ref 0–149)
VLDL Cholesterol Cal: 17 mg/dL (ref 5–40)

## 2020-01-05 LAB — CBC
Hematocrit: 42.6 % (ref 34.0–46.6)
Hemoglobin: 13.8 g/dL (ref 11.1–15.9)
MCH: 28.6 pg (ref 26.6–33.0)
MCHC: 32.4 g/dL (ref 31.5–35.7)
MCV: 88 fL (ref 79–97)
Platelets: 188 10*3/uL (ref 150–450)
RBC: 4.82 x10E6/uL (ref 3.77–5.28)
RDW: 14.5 % (ref 11.7–15.4)
WBC: 4.2 10*3/uL (ref 3.4–10.8)

## 2020-01-05 LAB — VITAMIN B12: Vitamin B-12: 1284 pg/mL — ABNORMAL HIGH (ref 232–1245)

## 2020-01-05 LAB — VITAMIN D 25 HYDROXY (VIT D DEFICIENCY, FRACTURES): Vit D, 25-Hydroxy: 52.4 ng/mL (ref 30.0–100.0)

## 2020-01-06 ENCOUNTER — Other Ambulatory Visit: Payer: Self-pay | Admitting: Family Medicine

## 2020-01-07 ENCOUNTER — Telehealth: Payer: Self-pay | Admitting: Family Medicine

## 2020-01-07 NOTE — Telephone Encounter (Signed)
bp's have been 125/73, 125/65 and 114/61. Assured pt that these readings are good. Continue to record readings.

## 2020-01-13 ENCOUNTER — Other Ambulatory Visit: Payer: Self-pay

## 2020-01-13 ENCOUNTER — Ambulatory Visit: Payer: Medicare PPO

## 2020-01-13 DIAGNOSIS — I10 Essential (primary) hypertension: Secondary | ICD-10-CM

## 2020-01-13 NOTE — Progress Notes (Signed)
Patient here today to have blood pressure checked.  Blood pressure reading today is 135/69, pulse 72.

## 2020-01-15 ENCOUNTER — Telehealth: Payer: Self-pay

## 2020-01-15 NOTE — Telephone Encounter (Signed)
Patient aware.

## 2020-01-15 NOTE — Telephone Encounter (Signed)
BP and HR look perfect

## 2020-01-15 NOTE — Telephone Encounter (Signed)
Patient called this morning to report blood pressure and pulse rate.  She is concerned because pulse rate is higher than it normally was.  I explained to patient that it is within normal range but I told her I would pass this information along to you for review.    5:19 am 132/76  Pulse 70 9:00 am 136/88  Pulse 71

## 2020-01-17 ENCOUNTER — Other Ambulatory Visit: Payer: Self-pay | Admitting: Family Medicine

## 2020-01-18 ENCOUNTER — Telehealth: Payer: Self-pay

## 2020-01-18 NOTE — Telephone Encounter (Signed)
Patient aware and verbalizes understanding. 

## 2020-01-18 NOTE — Telephone Encounter (Signed)
-----   Message from [name], DO sent at 01/13/2020  5:01 PM EDT ----- Please inform most recent BPs look ok. Continue monitoring daily with new medication.  Let me know if any problems arise before our next visit.

## 2020-02-12 DIAGNOSIS — L661 Lichen planopilaris: Secondary | ICD-10-CM | POA: Diagnosis not present

## 2020-03-11 ENCOUNTER — Other Ambulatory Visit: Payer: Self-pay | Admitting: *Deleted

## 2020-03-11 DIAGNOSIS — M25569 Pain in unspecified knee: Secondary | ICD-10-CM

## 2020-03-11 DIAGNOSIS — R600 Localized edema: Secondary | ICD-10-CM

## 2020-03-16 ENCOUNTER — Other Ambulatory Visit: Payer: Self-pay

## 2020-03-16 ENCOUNTER — Ambulatory Visit (INDEPENDENT_AMBULATORY_CARE_PROVIDER_SITE_OTHER): Payer: Medicare PPO | Admitting: Physician Assistant

## 2020-03-16 ENCOUNTER — Ambulatory Visit (HOSPITAL_COMMUNITY)
Admission: RE | Admit: 2020-03-16 | Discharge: 2020-03-16 | Disposition: A | Discharge status: Home / Self Care | Payer: Medicare PPO | Source: Ambulatory Visit | Attending: Vascular Surgery | Admitting: Vascular Surgery

## 2020-03-16 VITALS — BP 143/77 | HR 67 | Temp 98.5°F | Resp 16 | Ht 63.0 in | Wt 196.0 lb

## 2020-03-16 DIAGNOSIS — I872 Venous insufficiency (chronic) (peripheral): Secondary | ICD-10-CM | POA: Diagnosis not present

## 2020-03-16 DIAGNOSIS — R600 Localized edema: Secondary | ICD-10-CM | POA: Insufficient documentation

## 2020-03-16 DIAGNOSIS — M25569 Pain in unspecified knee: Secondary | ICD-10-CM | POA: Diagnosis not present

## 2020-03-16 NOTE — Progress Notes (Signed)
Office Note     CC:  follow up Requesting Provider:  [name], DO  HPI: Miranda Wilson is a 69 y.o. (Jun 06, 1951) female who presents due to BLE edema and skin changes.  She has had laser ablation of left greater saphenous vein with stab phlebectomy in June 2008 followed by laser ablation of left great saphenous vein accessory branch in June 2010 both performed by Dr. Kellie Simmering.  Patient also states she has had a right greater saphenous vein ablation as well however I cannot find these records in her medical chart.  She is also had both knees replaced and states she is problems with neuropathy in her feet from the surgeries.  She returns to clinic today complaining of worsening skin pigmentation changes of both shins and is wondering if anything can be done to prevent or reverse this.  She is wearing 15 to 20 mmHg pantyhose.  She had been wearing 20 to 30 mmHg thigh-high compression bilaterally however states that she felt these were cutting off her circulation.  She is trying to elevate her legs during the day when possible.  Patient states she is active on most days as she frequently watches her 51-year-old grandson.  She denies pain in her varicose veins of bilateral lower extremities.  She denies any venous ulcerations.  She denies tobacco use.   Past Medical History:  Diagnosis Date  . At risk for sleep apnea   . Bilateral edema of lower extremity    left > right --  wear compression hose  . Cervical spondylosis   . Chronic constipation   . Cystitis [id number]   Dr Prince Rome - here   . GERD (gastroesophageal reflux disease)   . History of colon polyps   . History of idiopathic seizure    AGE 57 to 20  X5  --  UNKNOW IDIOLOGY , PER PT WAS ANEMIC AT THE TIME--  NONE SINCE  . History of tachycardia    S/P  RADIOACTIVE IODINE ABLATION OF THYROID  . Hyperlipidemia   . Hypertension   . Hypothyroidism, postradioiodine therapy    AGE 33  . IBS (irritable bowel syndrome)   . OA  (osteoarthritis)    shoulders  left > right  . PMB (postmenopausal bleeding)   . S/P radioactive iodine thyroid ablation   . Urge urinary incontinence   . Varicose veins   . Vein disorder    LEFT ANKLE VEIN VALVE REFLUX WITH DECREASED CIRCULATION     Past Surgical History:  Procedure Laterality Date  . CATARACT EXTRACTION W/ INTRAOCULAR LENS  IMPLANT, BILATERAL  right 2011//  left 2013  . CESAREAN SECTION  1979  . CLOSED LEFT KNEE MANIPULATION AND RIGHT KNEE CORTISONE INJECTION  02-20-2010  . COLONOSCOPY W/ POLYPECTOMY  10-25-2008  . DILATION AND CURETTAGE OF UTERUS    . ENDOVENOUS ABLATION SAPHENOUS VEIN W/ LASER  04/2007  . EYE SURGERY Bilateral    cataract  . EYE SURGERY Bilateral    eyelid lifts /// left eye - placed removed  . HYSTEROSCOPY WITH D & C N/A 11/11/2014   Procedure: DILATATION AND CURETTAGE /HYSTEROSCOPY;  Surgeon: Gus Height, MD;  Location: [facility];  Service: Gynecology;  Laterality: N/A;  . JOINT REPLACEMENT    . LAPAROSCOPIC CHOLECYSTECTOMY  1993   and BILATERAL TUBAL LIGATION  . LEFT KNEE ARTHROTOMY W/ LYSIS ADHESIONS  12-01-2010  . MOUTH SURGERY     dental implants  . TONSILLECTOMY  1955  . TOTAL  KNEE ARTHROPLASTY Left 12-19-2009  . TOTAL KNEE ARTHROPLASTY Right 02/13/2016   Procedure: RIGHT TOTAL KNEE ARTHROPLASTY;  Surgeon: Gaynelle Arabian, MD;  Location: [facility];  Service: Orthopedics;  Laterality: Right;  . TUBAL LIGATION    . [place] to 2010   includes laser and phlebectomies    Social History   Socioeconomic History  . Marital status: Married    Spouse name: Ronalee Belts  . Number of children: 1  . Years of education: 38  . Highest education level: Bachelor's degree (e.g., BA, AB, BS)  Occupational History  . Occupation: Retired     Comment: Pharmacist, hospital  Tobacco Use  . Smoking status: Never Smoker  . Smokeless tobacco: Never Used  Substance and Sexual Activity  . Alcohol use: No  . Drug use: No  . Sexual  activity: Not Currently    Birth control/protection: None    Comment: not much  Other Topics Concern  . Not on file  Social History Narrative   Lives with husband.     Social Determinants of Health   Financial Resource Strain: Low Risk   . Difficulty of Paying Living Expenses: Not hard at all  Food Insecurity: No Food Insecurity  . Worried About Charity fundraiser in the Last Year: Never true  . Ran Out of Food in the Last Year: Never true  Transportation Needs: No Transportation Needs  . Lack of Transportation (Medical): No  . Lack of Transportation (Non-Medical): No  Physical Activity: Insufficiently Active  . Days of Exercise per Week: 3 days  . Minutes of Exercise per Session: 20 min  Stress: No Stress Concern Present  . Feeling of Stress : Not at all  Social Connections: Slightly Isolated  . Frequency of Communication with Friends and Family: Three times a week  . Frequency of Social Gatherings with Friends and Family: Three times a week  . Attends Religious Services: More than 4 times per year  . Active Member of Clubs or Organizations: No  . Attends Archivist Meetings: Never  . Marital Status: Married  Human resources officer Violence: Not At Risk  . Fear of Current or Ex-Partner: No  . Emotionally Abused: No  . Physically Abused: No  . Sexually Abused: No    Family History  Problem Relation Age of Onset  . COPD Mother        smoked  . Parkinsonism Mother   . Arthritis Mother   . Epilepsy Mother        early in 35's  . Myasthenia gravis Father   . Asthma Father   . Emphysema Father        smoked  . Glaucoma Father   . Arthritis Brother        knees  . Heart disease Brother   . Diabetes Brother   . Hyperlipidemia Brother   . Hyperlipidemia Brother   . Hyperlipidemia Brother   . Heart disease Brother   . Hyperlipidemia Brother   . CAD Paternal Grandmother 41  . Epilepsy Daughter 26  . Kidney disease Maternal Uncle   . Colon cancer Neg Hx      Current Outpatient Medications  Medication Sig Dispense Refill  . Cholecalciferol (VITAMIN D) 2000 units CAPS Take by mouth.    . docusate sodium (COLACE) 100 MG capsule Take 100 mg by mouth daily as needed.     Marland Kitchen levothyroxine (SYNTHROID) 150 MCG tablet TAKE 1/2 TABLET BY MOUTH EVERY DAY EXCEPT 1 TAB ON  MON, FRI. 70 tablet 1  . losartan (COZAAR) 25 MG tablet Take 1 tablet (25 mg total) by mouth daily. 90 tablet 1  . Multiple Vitamins-Minerals (CENTRUM SILVER PO) Take 1 tablet by mouth daily.    . nabumetone (RELAFEN) 500 MG tablet Take 2 tablets by mouth daily as needed.  1  . ondansetron (ZOFRAN ODT) 4 MG disintegrating tablet Take 1 tablet (4 mg total) by mouth every 8 (eight) hours as needed for nausea or vomiting. 20 tablet 0  . Propylene Glycol (SYSTANE BALANCE OP) Apply 1 drop to eye 2 (two) times daily as needed (Dry eyes). Reported on 01/31/2016    . rosuvastatin (CRESTOR) 40 MG tablet Take 1 tablet (40 mg total) by mouth daily. 30 tablet 4  . Triamcinolone Acetonide (TRIAMCINOLONE 0.1 % CREAM : EUCERIN) CREA Apply 1 application topically 2 (two) times daily.     No current facility-administered medications for this visit.    Allergies  Allergen Reactions  . Clindamycin/Lincomycin Other (See Comments)    Dizzy spells  . Codeine Nausea And Vomiting  . Crestor [Rosuvastatin Calcium] Other (See Comments)    Aching flu like large dose   . Lipitor [Atorvastatin Calcium] Other (See Comments)    Numbness in feet   . Pravachol Other (See Comments)    Back pain    . Simvastatin Other (See Comments)    Back pain    . Ultram [Tramadol Hcl] Nausea And Vomiting  . Vioxx [Rofecoxib] Other (See Comments)    Fluid rentention , elevated blood pressure   . Feldene [Piroxicam] Rash  . Myrbetriq [Mirabegron] Other (See Comments)    Constipation, headache  . Penicillins Rash    Pt states she CAN take Keflex  . Toviaz [Fesoterodine Fumarate Er] Other (See Comments)    Dry skin, dry  eyes, constipation     REVIEW OF SYSTEMS:   [X]  denotes positive finding, [ ]  denotes negative finding Cardiac  Comments:  Chest pain or chest pressure:    Shortness of breath upon exertion:    Short of breath when lying flat:    Irregular heart rhythm:        Vascular    Pain in calf, thigh, or hip brought on by ambulation:    Pain in feet at night that wakes you up from your sleep:     Blood clot in your veins:    Leg swelling:         Pulmonary    Oxygen at home:    Productive cough:     Wheezing:         Neurologic    Sudden weakness in arms or legs:     Sudden numbness in arms or legs:     Sudden onset of difficulty speaking or slurred speech:    Temporary loss of vision in one eye:     Problems with dizziness:         Gastrointestinal    Blood in stool:     Vomited blood:         Genitourinary    Burning when urinating:     Blood in urine:        Psychiatric    Major depression:         Hematologic    Bleeding problems:    Problems with blood clotting too easily:        Skin    Rashes or ulcers:        Constitutional  Fever or chills:      PHYSICAL EXAMINATION:  Vitals:   03/16/20 1347  BP: (!) 143/77  Pulse: 67  Resp: 16  Temp: 98.5 F (36.9 C)  TempSrc: Temporal  SpO2: 100%  Weight: 196 lb (88.9 kg)  Height: 5\' 3"  (1.6 m)    General:  WDWN in NAD; vital signs documented above Gait: Not observed HENT: WNL, normocephalic Pulmonary: normal non-labored breathing , without Rales, rhonchi,  wheezing Cardiac: regular HR Abdomen: soft, NT, no masses Skin: without rashes Vascular Exam/Pulses:  Right Left  Radial 2+ (normal) 2+ (normal)  DP 1+ (weak) 2+ (normal)  PT 2+ (normal) absent   Extremities: Large varicosities of both proximal medial thighs; ropey varicosities along the path of the right greater saphenous vein down to the level of the knee; stasis pigmentation changes of both medial ankles and lower legs with beginning  induration; no venous ulcerations; some venous discoloration of both feet and toes Musculoskeletal: no muscle wasting or atrophy  Neurologic: A&O X 3;  No focal weakness or paresthesias are detected Psychiatric:  The pt has Normal affect.   Non-Invasive Vascular Imaging:   RLE Incompetent deep system Greater saphenous vein reflux from saphenofemoral junction to mid thigh Saphenous vein not visualized from mid thigh to proximal calf Competent small saphenous vein Negative for DVT or superficial thrombophlebitis  LLE Incompetent deep system Greater saphenous vein reflux from saphenofemoral junction to mid thigh Saphenous vein not visualized from mid thigh to proximal calf Competent small saphenous vein Negative for DVT or superficial thrombophlebitis    ASSESSMENT/PLAN:: 69 y.o. female with chronic venous insufficiency of bilateral lower extremities  -Venous duplex negative for DVT of bilateral lower extremities -Venous reflux study consistent with prior left great saphenous vein ablation; right great saphenous vein visualized from mid thigh to proximal calf -She has an incompetent deep system bilaterally which likely get contributes to her ongoing problems with skin changes and edema -She does have ropey varicosities along the path of her right greater saphenous vein however states that these are asymptomatic and she does not wish to proceed with work-up for possible stab phlebectomy -We discussed that gold standard would be thigh-high 20 to 30 mmHg compression to be worn daily however she is only able to tolerate 15 to 20 mmHg -She was encouraged to elevate her legs when possible during the day and to use NSAIDs for venous symptoms -She will call/return office sooner if symptoms or physical exam changes; for now, she can follow-up as needed   Dagoberto Ligas, PA-C [facility] [phone]  Clinic MD:   Oneida Alar

## 2020-03-23 DIAGNOSIS — Z1231 Encounter for screening mammogram for malignant neoplasm of breast: Secondary | ICD-10-CM | POA: Diagnosis not present

## 2020-04-12 ENCOUNTER — Ambulatory Visit (INDEPENDENT_AMBULATORY_CARE_PROVIDER_SITE_OTHER): Payer: Medicare PPO | Admitting: *Deleted

## 2020-04-12 DIAGNOSIS — Z0000 Encounter for general adult medical examination without abnormal findings: Secondary | ICD-10-CM

## 2020-04-12 NOTE — Progress Notes (Addendum)
MEDICARE ANNUAL WELLNESS VISIT  04/12/2020  Telephone Visit Disclaimer This Medicare AWV was conducted by telephone due to national recommendations for restrictions regarding the COVID-19 Pandemic (e.g. social distancing).  I verified, using two identifiers, that I am speaking with Miranda Wilson or their authorized healthcare agent. I discussed the limitations, risks, security, and privacy concerns of performing an evaluation and management service by telephone and the potential availability of an in-person appointment in the future. The patient expressed understanding and agreed to proceed.   Subjective:  Miranda Wilson is a 69 y.o. female patient of Miranda Norlander, DO who had a Medicare Annual Wellness Visit today via telephone. Miranda Wilson is retired and lives with her husband Miranda Wilson. She has 1 daughter. She reports that she is socially active and does interact with friends/family regularly. She is minimally physically active and enjoys reading, writing poetry, gardening, and spending time with her grandson.   Patient Care Team: Miranda Norlander, DO as PCP - General (Family Medicine) Chipper Herb, MD (Inactive) as Referring Physician (Family Medicine)  Advanced Directives 04/12/2020 04/08/2019 12/20/2016 10/01/2016 02/27/2016 02/13/2016 02/13/2016  Does Patient Have a Medical Advance Directive? No No Yes Yes Yes Yes Yes  Type of Advance Directive - Public librarian;Living will [place];Living will [place];Living will [place];Living will [place];Living will  Does patient want to make changes to medical advance directive? - - Yes (Inpatient - patient defers changing a medical advance directive at this time) - - No - Patient declined -  Copy of [place] in Chart? - - No - copy requested - - Yes Yes  Would patient like information on creating a medical advance directive? No  - Patient declined No - Patient declined - - - - [facility] Utilization Over the Past 12 Months: # of hospitalizations or ER visits: 0 # of surgeries: 0  Review of Systems    Patient reports that her overall health is worse compared to last year.  History obtained from the patient and patient chart.   Patient Reported Readings (BP, Pulse, CBG, Weight, etc) none  Pain Assessment Pain : 0-10 Pain Score: 7  Pain Type: Neuropathic pain Pain Location: Leg Pain Orientation: Right, Left Pain Descriptors / Indicators: Aching, Tingling, Burning Pain Onset: More than a month ago Pain Frequency: Constant Pain Relieving Factors: hose, tylenol, aleve Effect of Pain on Daily Activities: slows her down  Pain Relieving Factors: hose, tylenol, aleve  Current Medications & Allergies (verified) Allergies as of 04/12/2020      Reactions   Clindamycin/lincomycin Other (See Comments)   Dizzy spells   Codeine Nausea And Vomiting   Crestor [rosuvastatin Calcium] Other (See Comments)   Aching flu like large dose    Lipitor [atorvastatin Calcium] Other (See Comments)   Numbness in feet    Pravachol Other (See Comments)   Back pain    Simvastatin Other (See Comments)   Back pain    Ultram [tramadol Hcl] Nausea And Vomiting   Vioxx [rofecoxib] Other (See Comments)   Fluid rentention , elevated blood pressure    Feldene [piroxicam] Rash   Myrbetriq [mirabegron] Other (See Comments)   Constipation, headache   Penicillins Rash   Pt states she CAN take Keflex   Toviaz [fesoterodine Fumarate Er] Other (See Comments)   Dry skin, dry eyes, constipation      Medication List  Accurate as of April 12, 2020  9:21 AM. If you have any questions, ask your nurse or doctor.        STOP taking these medications   docusate sodium 100 MG capsule Commonly known as: COLACE     TAKE these medications   CENTRUM SILVER PO Take 1 tablet by mouth daily.   levothyroxine 150 MCG tablet Commonly  known as: SYNTHROID TAKE 1/2 TABLET BY MOUTH EVERY DAY EXCEPT 1 TAB ON MON, FRI.   losartan 25 MG tablet Commonly known as: COZAAR Take 1 tablet (25 mg total) by mouth daily.   nabumetone 500 MG tablet Commonly known as: RELAFEN Take 2 tablets by mouth daily as needed.   ondansetron 4 MG disintegrating tablet Commonly known as: Zofran ODT Take 1 tablet (4 mg total) by mouth every 8 (eight) hours as needed for nausea or vomiting.   rosuvastatin 40 MG tablet Commonly known as: CRESTOR Take 1 tablet (40 mg total) by mouth daily. What changed: how much to take   SYSTANE BALANCE OP Apply 1 drop to eye 2 (two) times daily as needed (Dry eyes). Reported on 01/31/2016   triamcinolone 0.1 % cream : eucerin Crea Apply 1 application topically 2 (two) times daily.   vitamin B-12 1000 MCG tablet Commonly known as: CYANOCOBALAMIN Take 1,000 mcg by mouth every other day.   Vitamin D 50 MCG (2000 UT) Caps Take by mouth.       History (reviewed): Past Medical History:  Diagnosis Date  . At risk for sleep apnea   . Bilateral edema of lower extremity    left > right --  wear compression hose  . Cervical spondylosis   . Chronic constipation   . Cystitis 9242-6834'H   Dr Prince Rome - here   . GERD (gastroesophageal reflux disease)   . History of colon polyps   . History of idiopathic seizure    AGE 19 to 20  X5  --  UNKNOW IDIOLOGY , PER PT WAS ANEMIC AT THE TIME--  NONE SINCE  . History of tachycardia    S/P  RADIOACTIVE IODINE ABLATION OF THYROID  . Hyperlipidemia   . Hypertension   . Hypothyroidism, postradioiodine therapy    AGE 68  . IBS (irritable bowel syndrome)   . Neuropathy   . OA (osteoarthritis)    shoulders  left > right  . PMB (postmenopausal bleeding)   . S/P radioactive iodine thyroid ablation   . Urge urinary incontinence   . Varicose veins   . Vein disorder    LEFT ANKLE VEIN VALVE REFLUX WITH DECREASED CIRCULATION    Past Surgical History:  Procedure  Laterality Date  . CATARACT EXTRACTION W/ INTRAOCULAR LENS  IMPLANT, BILATERAL  right 2011//  left 2013  . CESAREAN SECTION  1979  . CLOSED LEFT KNEE MANIPULATION AND RIGHT KNEE CORTISONE INJECTION  02-20-2010  . COLONOSCOPY W/ POLYPECTOMY  10-25-2008  . DILATION AND CURETTAGE OF UTERUS    . ENDOVENOUS ABLATION SAPHENOUS VEIN W/ LASER  04/2007  . EYE SURGERY Bilateral    cataract  . EYE SURGERY Bilateral    eyelid lifts /// left eye - placed removed  . HYSTEROSCOPY WITH D & C N/A 11/11/2014   Procedure: DILATATION AND CURETTAGE /HYSTEROSCOPY;  Surgeon: Gus Height, MD;  Location: [facility];  Service: Gynecology;  Laterality: N/A;  . JOINT REPLACEMENT    . LAPAROSCOPIC CHOLECYSTECTOMY  1993   and BILATERAL TUBAL LIGATION  . LEFT KNEE ARTHROTOMY W/  LYSIS ADHESIONS  12-01-2010  . MOUTH SURGERY     dental implants  . TONSILLECTOMY  1955  . TOTAL KNEE ARTHROPLASTY Left 12-19-2009  . TOTAL KNEE ARTHROPLASTY Right 02/13/2016   Procedure: RIGHT TOTAL KNEE ARTHROPLASTY;  Surgeon: Gaynelle Arabian, MD;  Location: [facility];  Service: Orthopedics;  Laterality: Right;  . TUBAL LIGATION    . [place] to 2010   includes laser and phlebectomies   Family History  Problem Relation Age of Onset  . COPD Mother        smoked  . Parkinsonism Mother   . Arthritis Mother   . Epilepsy Mother        early in 59's  . Myasthenia gravis Father   . Asthma Father   . Emphysema Father        smoked  . Glaucoma Father   . Arthritis Brother        knees  . Heart disease Brother   . Diabetes Brother   . Hyperlipidemia Brother   . Hyperlipidemia Brother   . Hyperlipidemia Brother   . Heart disease Brother   . Hyperlipidemia Brother   . CAD Paternal Grandmother 2  . Epilepsy Daughter 1  . Diabetes Daughter   . Kidney disease Maternal Uncle   . Colon cancer Neg Hx    Social History   Socioeconomic History  . Marital status: Married    Spouse name: Miranda Wilson  . Number of  children: 1  . Years of education: 69  . Highest education level: Bachelor's degree (e.g., BA, AB, BS)  Occupational History  . Occupation: Retired     Comment: Pharmacist, hospital  Tobacco Use  . Smoking status: Never Smoker  . Smokeless tobacco: Never Used  Vaping Use  . Vaping Use: Never used  Substance and Sexual Activity  . Alcohol use: No  . Drug use: No  . Sexual activity: Not Currently    Birth control/protection: None    Comment: not much  Other Topics Concern  . Not on file  Social History Narrative   Lives with husband.     Social Determinants of Health   Financial Resource Strain:   . Difficulty of Paying Living Expenses:   Food Insecurity:   . Worried About Charity fundraiser in the Last Year:   . Arboriculturist in the Last Year:   Transportation Needs:   . Film/video editor (Medical):   Marland Kitchen Lack of Transportation (Non-Medical):   Physical Activity:   . Days of Exercise per Week:   . Minutes of Exercise per Session:   Stress:   . Feeling of Stress :   Social Connections:   . Frequency of Communication with Friends and Family:   . Frequency of Social Gatherings with Friends and Family:   . Attends Religious Services:   . Active Member of Clubs or Organizations:   . Attends Archivist Meetings:   Marland Kitchen Marital Status:     Activities of Daily Living In your present state of health, do you have any difficulty performing the following activities: 04/12/2020  Hearing? N  Vision? N  Difficulty concentrating or making decisions? Y  Comment memory loss  Walking or climbing stairs? Y  Comment needs cane and railing  Dressing or bathing? Y  Comment drops things  Doing errands, shopping? Y  Comment doesnt walk or drive long distances  Preparing Food and eating ? N  Using the Toilet? N  In the  past six months, have you accidently leaked urine? Y  Comment for 11 years, has seen urology  Do you have problems with loss of bowel control? N  Managing your  Medications? N  Managing your Finances? N  Housekeeping or managing your Housekeeping? N  Some recent data might be hidden    Patient Education/ Literacy How often do you need to have someone help you when you read instructions, pamphlets, or other written materials from your doctor or pharmacy?: 1 - Never What is the last grade level you completed in school?: post bachelor  Exercise Current Exercise Habits: Home exercise routine, Type of exercise: stretching;Other - see comments (stationary bike), Time (Minutes): 15, Frequency (Times/Week): 3, Weekly Exercise (Minutes/Week): 45, Intensity: Mild, Exercise limited by: None identified  Diet Patient reports consuming 3 meals a day and 2 snack(s) a day Patient reports that her primary diet is: Regular Patient reports that she does have regular access to food.   Depression Screen PHQ 2/9 Scores 04/12/2020 01/04/2020 04/08/2019 09/30/2018 06/09/2018 05/22/2018 02/24/2018  PHQ - 2 Score 0 0 0 0 0 0 0  PHQ- 9 Score - 0 - - - - -     Fall Risk Fall Risk  04/12/2020 01/04/2020 04/08/2019 09/30/2018 06/09/2018  Falls in the past year? 0 0 0 0 No  Number falls in past yr: - - - - -  Injury with Fall? - - - - -  Risk for fall due to : - - - - -     Objective:  Miranda Wilson seemed alert and oriented and she participated appropriately during our telephone visit.  Blood Pressure Weight BMI  BP Readings from Last 3 Encounters:  03/16/20 (!) 143/77  01/04/20 138/79  12/14/19 132/74   Wt Readings from Last 3 Encounters:  03/16/20 196 lb (88.9 kg)  01/04/20 197 lb (89.4 kg)  09/30/18 198 lb (89.8 kg)   BMI Readings from Last 1 Encounters:  03/16/20 34.72 kg/m    *Unable to obtain current vital signs, weight, and BMI due to telephone visit type  Hearing/Vision  . Miranda Wilson did not seem to have difficulty with hearing/understanding during the telephone conversation . Reports that she has not had a formal eye exam by an eye care professional within  the past year . Reports that she has not had a formal hearing evaluation within the past year *Unable to fully assess hearing and vision during telephone visit type  Cognitive Function: 6CIT Screen 04/12/2020 04/08/2019  What Year? 0 points 0 points  What month? 0 points 0 points  What time? 0 points 0 points  Count back from 20 0 points 0 points  Months in reverse 0 points 0 points  Repeat phrase 0 points 0 points  Total Score 0 0   (Normal:0-7, Significant for Dysfunction: >8)  Normal Cognitive Function Screening: Yes   Immunization & Health Maintenance Record Immunization History  Administered Date(s) Administered  . Influenza, High Dose Seasonal PF 07/18/2016, 08/15/2017, 09/21/2018  . Influenza,inj,Quad PF,6+ Mos 08/18/2015  . Influenza-Unspecified 08/21/2019  . PFIZER SARS-COV-2 Vaccination 03/19/2020, 04/09/2020  . Pneumococcal Conjugate-13 02/03/2015  . Pneumococcal Polysaccharide-23 11/23/2016  . Td 11/12/2011  . Tdap 11/12/2011    Health Maintenance  Topic Date Due  . Hepatitis C Screening  Never done  . INFLUENZA VACCINE  05/29/2020  . TETANUS/TDAP  11/11/2021  . MAMMOGRAM  03/23/2022  . COLONOSCOPY  10/11/2026  . DEXA SCAN  Completed  . COVID-19 Vaccine  Completed  .  PNA vac Low Risk Adult  Completed       Assessment  This is a routine wellness examination for Miranda Wilson.  Health Maintenance: Due or Overdue Health Maintenance Due  Topic Date Due  . Hepatitis C Screening  Never done    Miranda Wilson does not need a referral for Community Assistance: Care Management:   no Social Work:    no Prescription Assistance:  no Nutrition/Diabetes Education:  no   Plan:  Personalized Goals Goals Addressed            This Visit's Progress   . Patient Stated       04/12/2020 AWV Goal: Keep All Scheduled Appointments  Over the next year, patient will attend all scheduled appointments with their PCP and any specialists that they see.        Personalized Health Maintenance & Screening Recommendations    Lung Cancer Screening Recommended: no (Low Dose CT Chest recommended if Age 32-80 years, 30 pack-year currently smoking OR have quit w/in past 15 years) Hepatitis C Screening recommended: no HIV Screening recommended: no  Advanced Directives: Written information was not prepared per patient's request.  Referrals & Orders No orders of the defined types were placed in this encounter.   Follow-up Plan . Follow-up with Miranda Norlander, DO as planned  I have personally reviewed and noted the following in the patient's chart:   . Medical and social history . Use of alcohol, tobacco or illicit drugs  . Current medications and supplements . Functional ability and status . Nutritional status . Physical activity . Advanced directives . List of other physicians . Hospitalizations, surgeries, and ER visits in previous 12 months . Vitals . Screenings to include cognitive, depression, and falls . Referrals and appointments  In addition, I have reviewed and discussed with Miranda Wilson certain preventive protocols, quality metrics, and best practice recommendations. A written personalized care plan for preventive services as well as general preventive health recommendations is available and can be mailed to the patient at her request.      Baldomero Lamy, LPN [id number]   I have read nurse Cairo Agostinelli's note and agree with her assessment and plan.  Patient to continue following with PCP for ongoing chronic medical problems.  Ashly M. Lajuana Ripple, [facility]

## 2020-04-14 ENCOUNTER — Other Ambulatory Visit: Payer: Self-pay

## 2020-04-14 ENCOUNTER — Ambulatory Visit (INDEPENDENT_AMBULATORY_CARE_PROVIDER_SITE_OTHER): Payer: Medicare PPO

## 2020-04-14 ENCOUNTER — Encounter: Payer: Self-pay | Admitting: Family Medicine

## 2020-04-14 ENCOUNTER — Ambulatory Visit: Payer: Medicare PPO | Admitting: Family Medicine

## 2020-04-14 VITALS — BP 140/80 | HR 68 | Temp 97.4°F | Ht 63.0 in | Wt 199.2 lb

## 2020-04-14 DIAGNOSIS — M79604 Pain in right leg: Secondary | ICD-10-CM | POA: Diagnosis not present

## 2020-04-14 DIAGNOSIS — R600 Localized edema: Secondary | ICD-10-CM | POA: Diagnosis not present

## 2020-04-14 NOTE — Patient Instructions (Signed)
Continue triamcinolone to affected area. Use ice. Continue elevation.  I will contact you with formal review of xray by radiology. Likely will plan for soft tissue ultrasound but will call you to confirm appointment.

## 2020-04-14 NOTE — Progress Notes (Signed)
Subjective: CC: Not on leg PCP: [name], DO [id number] Miranda Wilson is a 69 y.o. female presenting to clinic today for:  1. Knot on leg Patient reports about a 6-week history of right lower leg pain and swelling.  She notes that it has been intermittently red.  Denies any skin breakdown, preceding injury.  She had a blistering lesion on this area a couple of years ago that was relieved by topical triamcinolone cream.  However, she has been applying topical triamcinolone cream this go around and this has not been improving symptoms.  She has been using Tylenol but again not improving symptoms.  She saw a vein and vascular doctor recently but they did not find this to be any thing to do with her venous insufficiency.  She has chronic lower extremity edema and wears compression hose intermittently.   ROS: Per HPI  Allergies  Allergen Reactions  . Clindamycin/Lincomycin Other (See Comments)    Dizzy spells  . Codeine Nausea And Vomiting  . Crestor [Rosuvastatin Calcium] Other (See Comments)    Aching flu like large dose   . Lipitor [Atorvastatin Calcium] Other (See Comments)    Numbness in feet   . Pravachol Other (See Comments)    Back pain    . Simvastatin Other (See Comments)    Back pain    . Ultram [Tramadol Hcl] Nausea And Vomiting  . Vioxx [Rofecoxib] Other (See Comments)    Fluid rentention , elevated blood pressure   . Feldene [Piroxicam] Rash  . Myrbetriq [Mirabegron] Other (See Comments)    Constipation, headache  . Penicillins Rash    Pt states she CAN take Keflex  . Toviaz [Fesoterodine Fumarate Er] Other (See Comments)    Dry skin, dry eyes, constipation   Past Medical History:  Diagnosis Date  . At risk for sleep apnea   . Bilateral edema of lower extremity    left > right --  wear compression hose  . Cervical spondylosis   . Chronic constipation   . Cystitis 5638-7564'P   Dr Prince Rome - here   . GERD (gastroesophageal reflux disease)   . History of  colon polyps   . History of idiopathic seizure    AGE 47 to 20  X5  --  UNKNOW IDIOLOGY , PER PT WAS ANEMIC AT THE TIME--  NONE SINCE  . History of tachycardia    S/P  RADIOACTIVE IODINE ABLATION OF THYROID  . Hyperlipidemia   . Hypertension   . Hypothyroidism, postradioiodine therapy    AGE 39  . IBS (irritable bowel syndrome)   . Neuropathy   . OA (osteoarthritis)    shoulders  left > right  . PMB (postmenopausal bleeding)   . S/P radioactive iodine thyroid ablation   . Urge urinary incontinence   . Varicose veins   . Vein disorder    LEFT ANKLE VEIN VALVE REFLUX WITH DECREASED CIRCULATION     Current Outpatient Medications:  .  Cholecalciferol (VITAMIN D) 2000 units CAPS, Take by mouth., Disp: , Rfl:  .  levothyroxine (SYNTHROID) 150 MCG tablet, TAKE 1/2 TABLET BY MOUTH EVERY DAY EXCEPT 1 TAB ON MON, FRI., Disp: 70 tablet, Rfl: 1 .  losartan (COZAAR) 25 MG tablet, Take 1 tablet (25 mg total) by mouth daily., Disp: 90 tablet, Rfl: 1 .  Multiple Vitamins-Minerals (CENTRUM SILVER PO), Take 1 tablet by mouth daily., Disp: , Rfl:  .  nabumetone (RELAFEN) 500 MG tablet, Take 2 tablets by mouth daily as  needed., Disp: , Rfl: 1 .  ondansetron (ZOFRAN ODT) 4 MG disintegrating tablet, Take 1 tablet (4 mg total) by mouth every 8 (eight) hours as needed for nausea or vomiting., Disp: 20 tablet, Rfl: 0 .  Propylene Glycol (SYSTANE BALANCE OP), Apply 1 drop to eye 2 (two) times daily as needed (Dry eyes). Reported on 01/31/2016, Disp: , Rfl:  .  rosuvastatin (CRESTOR) 40 MG tablet, Take 1 tablet (40 mg total) by mouth daily. (Patient taking differently: Take 20 mg by mouth daily. ), Disp: 30 tablet, Rfl: 4 .  Triamcinolone Acetonide (TRIAMCINOLONE 0.1 % CREAM : EUCERIN) CREA, Apply 1 application topically 2 (two) times daily., Disp: , Rfl:  .  vitamin B-12 (CYANOCOBALAMIN) 1000 MCG tablet, Take 1,000 mcg by mouth every other day., Disp: , Rfl:  Social History   Socioeconomic History  . Marital  status: Married    Spouse name: Ronalee Belts  . Number of children: 1  . Years of education: 40  . Highest education level: Bachelor's degree (e.g., BA, AB, BS)  Occupational History  . Occupation: Retired     Comment: Pharmacist, hospital  Tobacco Use  . Smoking status: Never Smoker  . Smokeless tobacco: Never Used  Vaping Use  . Vaping Use: Never used  Substance and Sexual Activity  . Alcohol use: No  . Drug use: No  . Sexual activity: Not Currently    Birth control/protection: None    Comment: not much  Other Topics Concern  . Not on file  Social History Narrative   Lives with husband.     Social Determinants of Health   Financial Resource Strain:   . Difficulty of Paying Living Expenses:   Food Insecurity:   . Worried About Charity fundraiser in the Last Year:   . Arboriculturist in the Last Year:   Transportation Needs:   . Film/video editor (Medical):   Marland Kitchen Lack of Transportation (Non-Medical):   Physical Activity:   . Days of Exercise per Week:   . Minutes of Exercise per Session:   Stress:   . Feeling of Stress :   Social Connections:   . Frequency of Communication with Friends and Family:   . Frequency of Social Gatherings with Friends and Family:   . Attends Religious Services:   . Active Member of Clubs or Organizations:   . Attends Archivist Meetings:   Marland Kitchen Marital Status:   Intimate Partner Violence:   . Fear of Current or Ex-Partner:   . Emotionally Abused:   Marland Kitchen Physically Abused:   . Sexually Abused:    Family History  Problem Relation Age of Onset  . COPD Mother        smoked  . Parkinsonism Mother   . Arthritis Mother   . Epilepsy Mother        early in 22's  . Myasthenia gravis Father   . Asthma Father   . Emphysema Father        smoked  . Glaucoma Father   . Arthritis Brother        knees  . Heart disease Brother   . Diabetes Brother   . Hyperlipidemia Brother   . Hyperlipidemia Brother   . Hyperlipidemia Brother   . Heart disease  Brother   . Hyperlipidemia Brother   . CAD Paternal Grandmother 43  . Epilepsy Daughter 78  . Diabetes Daughter   . Kidney disease Maternal Uncle   . Colon cancer Neg Hx  Objective: Office vital signs reviewed. BP 140/80   Pulse 68   Temp (!) 97.4 F (36.3 C)   Ht 5\' 3"  (1.6 m)   Wt 199 lb 3.2 oz (90.4 kg)   LMP 04/29/1991   SpO2 100%   BMI 35.29 kg/m   Physical Examination:  General: Awake, alert, No acute distress Extremities: She has edema on bilateral lower extremities. MSK:   Right lower extremity: There is a palpable half-dollar sized area of induration that is well-circumscribed.  There is associated hyperpigmentation but no erythema.  No warmth.  No skin breakdown.  DG Tibia/Fibula Right  Result Date: 04/14/2020 CLINICAL DATA:  Induration of the medial soft tissues over the last 6 weeks. EXAM: RIGHT TIBIA AND FIBULA - 2 VIEW COMPARISON:  None FINDINGS: Previous total knee replacement. Nonspecific edema pattern within the subcutaneous fat of the lower extremity. No sign of foreign object. IMPRESSION: Nonspecific soft tissue edema. No sign of soft tissue calcifications. Electronically Signed   By: Nelson Chimes M.D.   On: 04/14/2020 14:49    Assessment/ Plan: 69 y.o. female   1. Pain of right lower extremity Uncertain lesion.  There is certainly a palpable soft tissue swelling.  I am going to evaluate for any calcification or other concerning signs or symptoms below the cutaneous tissue.  X-ray was obtained and personal review did show a mild hyperlucency in the area of concern but no calcifications or other foreign bodies.  Radiology review confirms this that there is nonspecific soft tissue edema.  We will plan for soft tissue ultrasound.  In interim, I have recommended ice, elevation and surveillance for any evidence of infection.  At this time I do not appreciate any cellulitis. - DG Tibia/Fibula Right; Future   Orders Placed This Encounter  Procedures  . DG  Tibia/Fibula Right    Standing Status:   Future    Number of Occurrences:   1    Standing Expiration Date:   04/14/2021    Order Specific Question:   Reason for Exam (SYMPTOM  OR DIAGNOSIS REQUIRED)    Answer:   medial lower leg soft tissue lesion w/ induration x6 weeks. ?caliphylaxis?    Order Specific Question:   Preferred imaging location?    Answer:   Internal    Order Specific Question:   Radiology Contrast Protocol - do NOT remove file path    Answer:   [email or web address]  . [place] RT LOWER EXTREM LTD SOFT TISSUE NON VASCULAR    Standing Status:   Future    Standing Expiration Date:   04/14/2021    Scheduling Instructions:     Patient can potentially do tomorrow 6/18 but will be out of town starting Saturday.  Can do when she returns if needed.    Order Specific Question:   Reason for Exam (SYMPTOM  OR DIAGNOSIS REQUIRED)    Answer:   soft tissue swelling right lower leg.    Order Specific Question:   Preferred imaging location?    Answer:   Internal   No orders of the defined types were placed in this encounter.    [name], DO [place] [phone]

## 2020-04-15 ENCOUNTER — Ambulatory Visit: Payer: Medicare PPO | Admitting: Nurse Practitioner

## 2020-05-09 ENCOUNTER — Encounter: Payer: Self-pay | Admitting: Family Medicine

## 2020-05-09 ENCOUNTER — Telehealth: Payer: Self-pay | Admitting: Family Medicine

## 2020-05-09 NOTE — Telephone Encounter (Signed)
Appt given for Covid clinic on Wednesday because patient did not want appts available for tomorrow.

## 2020-05-10 ENCOUNTER — Ambulatory Visit: Payer: Medicare PPO | Admitting: Nurse Practitioner

## 2020-05-11 ENCOUNTER — Ambulatory Visit (INDEPENDENT_AMBULATORY_CARE_PROVIDER_SITE_OTHER): Payer: Medicare PPO | Admitting: Family Medicine

## 2020-05-11 ENCOUNTER — Encounter: Payer: Self-pay | Admitting: Family Medicine

## 2020-05-11 VITALS — BP 147/86 | HR 90 | Temp 98.2°F

## 2020-05-11 DIAGNOSIS — J0100 Acute maxillary sinusitis, unspecified: Secondary | ICD-10-CM

## 2020-05-11 MED ORDER — CEFDINIR 300 MG PO CAPS: 300.0000 mg | ORAL_CAPSULE | Freq: Two times a day (BID) | ORAL | 0 refills | Status: AC

## 2020-05-11 MED ORDER — PREDNISONE 10 MG (21) PO TBPK: ORAL_TABLET | ORAL | 0 refills | Status: DC

## 2020-05-11 NOTE — Progress Notes (Signed)
Assessment & Plan:  1. Acute non-recurrent maxillary sinusitis - Discussed symptom management.  - cefdinir (OMNICEF) 300 MG capsule; Take 1 capsule (300 mg total) by mouth 2 (two) times daily for 7 days. 1 po BID  Dispense: 14 capsule; Refill: 0 - predniSONE (STERAPRED UNI-PAK 21 TAB) 10 MG (21) TBPK tablet; As directed x 6 days  Dispense: 21 tablet; Refill: 0 - Novel Coronavirus, NAA ([facility])   Follow up plan: Return if symptoms worsen or fail to improve.  Hendricks Limes, MSN, APRN, FNP-C [facility]  Subjective:   Patient ID: DEOLA REWIS, female    DOB: 1951/01/09, 69 y.o.   MRN: [id number]  HPI: KALIANNA VERBEKE is a 69 y.o. female presenting on 05/11/2020 for Nasal Congestion (x 1 week) and Cough  Patient complains of cough, head congestion, facial pain/pressure and postnasal drainage. Onset of symptoms was 1 week ago, gradually worsening since that time. She is drinking plenty of fluids. Evaluation to date: none. Treatment to date: antihistamines and Tylenol. She does not smoke.    ROS: Negative unless specifically indicated above in HPI.   Relevant past medical history reviewed and updated as indicated.   Allergies and medications reviewed and updated.   Current Outpatient Medications:  .  Cholecalciferol (VITAMIN D) 2000 units CAPS, Take by mouth., Disp: , Rfl:  .  levothyroxine (SYNTHROID) 150 MCG tablet, TAKE 1/2 TABLET BY MOUTH EVERY DAY EXCEPT 1 TAB ON MON, FRI., Disp: 70 tablet, Rfl: 1 .  losartan (COZAAR) 25 MG tablet, Take 1 tablet (25 mg total) by mouth daily., Disp: 90 tablet, Rfl: 1 .  Multiple Vitamins-Minerals (CENTRUM SILVER PO), Take 1 tablet by mouth daily., Disp: , Rfl:  .  nabumetone (RELAFEN) 500 MG tablet, Take 2 tablets by mouth daily as needed., Disp: , Rfl: 1 .  ondansetron (ZOFRAN ODT) 4 MG disintegrating tablet, Take 1 tablet (4 mg total) by mouth every 8 (eight) hours as needed for nausea or vomiting., Disp: 20 tablet,  Rfl: 0 .  Propylene Glycol (SYSTANE BALANCE OP), Apply 1 drop to eye 2 (two) times daily as needed (Dry eyes). Reported on 01/31/2016, Disp: , Rfl:  .  rosuvastatin (CRESTOR) 40 MG tablet, Take 1 tablet (40 mg total) by mouth daily. (Patient taking differently: Take 20 mg by mouth daily. ), Disp: 30 tablet, Rfl: 4 .  Triamcinolone Acetonide (TRIAMCINOLONE 0.1 % CREAM : EUCERIN) CREA, Apply 1 application topically 2 (two) times daily., Disp: , Rfl:  .  vitamin B-12 (CYANOCOBALAMIN) 1000 MCG tablet, Take 1,000 mcg by mouth every other day., Disp: , Rfl:   Allergies  Allergen Reactions  . Clindamycin/Lincomycin Other (See Comments)    Dizzy spells  . Codeine Nausea And Vomiting  . Crestor [Rosuvastatin Calcium] Other (See Comments)    Aching flu like large dose   . Lipitor [Atorvastatin Calcium] Other (See Comments)    Numbness in feet   . Pravachol Other (See Comments)    Back pain    . Simvastatin Other (See Comments)    Back pain    . Ultram [Tramadol Hcl] Nausea And Vomiting  . Vioxx [Rofecoxib] Other (See Comments)    Fluid rentention , elevated blood pressure   . Feldene [Piroxicam] Rash  . Myrbetriq [Mirabegron] Other (See Comments)    Constipation, headache  . Penicillins Rash    Pt states she CAN take Keflex  . Toviaz [Fesoterodine Fumarate Er] Other (See Comments)    Dry skin, dry eyes, constipation  Objective:   BP (!) 147/86   Pulse 90   Temp 98.2 F (36.8 C) (Temporal)   LMP 04/29/1991   SpO2 98%    Physical Exam Vitals reviewed.  Constitutional:      General: She is not in acute distress.    Appearance: Normal appearance. She is not ill-appearing, toxic-appearing or diaphoretic.  HENT:     Head: Normocephalic and atraumatic.     Right Ear: Tympanic membrane, ear canal and external ear normal. There is no impacted cerumen.     Left Ear: Tympanic membrane, ear canal and external ear normal. There is no impacted cerumen.     Nose:     Right Sinus: Maxillary  sinus tenderness present. No frontal sinus tenderness.     Left Sinus: Maxillary sinus tenderness present. No frontal sinus tenderness.     Mouth/Throat:     Mouth: Mucous membranes are moist.     Pharynx: Oropharynx is clear. No oropharyngeal exudate or posterior oropharyngeal erythema.  Eyes:     General: No scleral icterus.       Right eye: No discharge.        Left eye: No discharge.     Conjunctiva/sclera: Conjunctivae normal.  Cardiovascular:     Rate and Rhythm: Normal rate.  Pulmonary:     Effort: Pulmonary effort is normal. No respiratory distress.  Musculoskeletal:        General: Normal range of motion.     Cervical back: Normal range of motion.  Lymphadenopathy:     Cervical: No cervical adenopathy.  Skin:    General: Skin is warm and dry.     Capillary Refill: Capillary refill takes less than 2 seconds.  Neurological:     General: No focal deficit present.     Mental Status: She is alert and oriented to person, place, and time. Mental status is at baseline.  Psychiatric:        Mood and Affect: Mood normal.        Behavior: Behavior normal.        Thought Content: Thought content normal.        Judgment: Judgment normal.

## 2020-05-12 LAB — NOVEL CORONAVIRUS, NAA: SARS-CoV-2, NAA: NOT DETECTED

## 2020-05-12 LAB — SARS-COV-2, NAA 2 DAY TAT

## 2020-05-18 ENCOUNTER — Ambulatory Visit: Payer: Medicare PPO | Admitting: Family Medicine

## 2020-05-21 ENCOUNTER — Encounter: Payer: Self-pay | Admitting: Family Medicine

## 2020-05-21 DIAGNOSIS — B379 Candidiasis, unspecified: Secondary | ICD-10-CM

## 2020-05-23 MED ORDER — FLUCONAZOLE 150 MG PO TABS: 150.0000 mg | ORAL_TABLET | Freq: Once | ORAL | 0 refills | Status: AC

## 2020-06-27 ENCOUNTER — Other Ambulatory Visit: Payer: Self-pay | Admitting: Family Medicine

## 2020-06-27 DIAGNOSIS — I10 Essential (primary) hypertension: Secondary | ICD-10-CM

## 2020-07-06 ENCOUNTER — Other Ambulatory Visit: Payer: Self-pay

## 2020-07-06 ENCOUNTER — Ambulatory Visit: Payer: Medicare PPO | Admitting: Family Medicine

## 2020-07-06 VITALS — BP 139/87 | HR 70 | Temp 97.7°F | Ht 63.0 in | Wt 195.0 lb

## 2020-07-06 DIAGNOSIS — M79604 Pain in right leg: Secondary | ICD-10-CM

## 2020-07-06 DIAGNOSIS — Z820 Family history of epilepsy and other diseases of the nervous system: Secondary | ICD-10-CM

## 2020-07-06 DIAGNOSIS — E890 Postprocedural hypothyroidism: Secondary | ICD-10-CM | POA: Diagnosis not present

## 2020-07-06 DIAGNOSIS — R413 Other amnesia: Secondary | ICD-10-CM

## 2020-07-06 DIAGNOSIS — E538 Deficiency of other specified B group vitamins: Secondary | ICD-10-CM | POA: Diagnosis not present

## 2020-07-06 DIAGNOSIS — R202 Paresthesia of skin: Secondary | ICD-10-CM | POA: Diagnosis not present

## 2020-07-06 NOTE — Progress Notes (Signed)
Subjective: CC: Knot on leg PCP: [name], DO GYK:ZLDJTTS GELENA KLOSINSKI is a 69 y.o. female presenting to clinic today for:  1. Knot on leg Not one leg did seem to get better after she was on prednisone and an antibiotic for sinus infection.  However she hit it again and it started becoming sore again.  2.  Paresthesia Patient reports that she is having increasing numbness in the hands and seems to be dropping things more often.  The right hand seems to be worse than the left hand.  She has known neuropathy in the lower extremities.  She has a history of B12 deficiency but is on B12 supplementation.  We did backed this down just a little bit to every other day rather than every day because she had a high level of B12 in the blood.  She talked to one of her friends he is on oxcarbazepine further neuropathy and she wants to know my opinion on this.  She has chronic neuropathy in the bilateral lower extremities.   ROS: Per HPI  Allergies  Allergen Reactions  . Clindamycin/Lincomycin Other (See Comments)    Dizzy spells  . Codeine Nausea And Vomiting  . Crestor [Rosuvastatin Calcium] Other (See Comments)    Aching flu like large dose   . Lipitor [Atorvastatin Calcium] Other (See Comments)    Numbness in feet   . Pravachol Other (See Comments)    Back pain    . Simvastatin Other (See Comments)    Back pain    . Ultram [Tramadol Hcl] Nausea And Vomiting  . Vioxx [Rofecoxib] Other (See Comments)    Fluid rentention , elevated blood pressure   . Feldene [Piroxicam] Rash  . Myrbetriq [Mirabegron] Other (See Comments)    Constipation, headache  . Penicillins Rash    Pt states she CAN take Keflex  . Toviaz [Fesoterodine Fumarate Er] Other (See Comments)    Dry skin, dry eyes, constipation   Past Medical History:  Diagnosis Date  . At risk for sleep apnea   . Bilateral edema of lower extremity    left > right --  wear compression hose  . Cervical spondylosis   . Chronic  constipation   . Cystitis [id number]'[id number]   Dr Prince Rome - here   . GERD (gastroesophageal reflux disease)   . History of colon polyps   . History of idiopathic seizure    AGE 71 to 20  X5  --  UNKNOW IDIOLOGY , PER PT WAS ANEMIC AT THE TIME--  NONE SINCE  . History of tachycardia    S/P  RADIOACTIVE IODINE ABLATION OF THYROID  . Hyperlipidemia   . Hypertension   . Hypothyroidism, postradioiodine therapy    AGE 55  . IBS (irritable bowel syndrome)   . Neuropathy   . OA (osteoarthritis)    shoulders  left > right  . PMB (postmenopausal bleeding)   . S/P radioactive iodine thyroid ablation   . Urge urinary incontinence   . Varicose veins   . Vein disorder    LEFT ANKLE VEIN VALVE REFLUX WITH DECREASED CIRCULATION     Current Outpatient Medications:  .  Cholecalciferol (VITAMIN D) 2000 units CAPS, Take by mouth., Disp: , Rfl:  .  levothyroxine (SYNTHROID) 150 MCG tablet, TAKE 1/2 TABLET BY MOUTH EVERY DAY EXCEPT 1 TAB ON MON, FRI., Disp: 70 tablet, Rfl: 1 .  losartan (COZAAR) 25 MG tablet, TAKE 1 TABLET BY MOUTH EVERY DAY, Disp: 90 tablet, Rfl: 0 .  Multiple Vitamins-Minerals (CENTRUM SILVER PO), Take 1 tablet by mouth daily., Disp: , Rfl:  .  Propylene Glycol (SYSTANE BALANCE OP), Apply 1 drop to eye 2 (two) times daily as needed (Dry eyes). Reported on 01/31/2016, Disp: , Rfl:  .  rosuvastatin (CRESTOR) 40 MG tablet, Take 1 tablet (40 mg total) by mouth daily. (Patient taking differently: Take 20 mg by mouth daily. ), Disp: 30 tablet, Rfl: 4 .  Triamcinolone Acetonide (TRIAMCINOLONE 0.1 % CREAM : EUCERIN) CREA, Apply 1 application topically 2 (two) times daily., Disp: , Rfl:  .  vitamin B-12 (CYANOCOBALAMIN) 1000 MCG tablet, Take 1,000 mcg by mouth every other day., Disp: , Rfl:  .  nabumetone (RELAFEN) 500 MG tablet, Take 2 tablets by mouth daily as needed. (Patient not taking: Reported on 07/06/2020), Disp: , Rfl: 1 .  ondansetron (ZOFRAN ODT) 4 MG disintegrating tablet, Take 1 tablet (4 mg  total) by mouth every 8 (eight) hours as needed for nausea or vomiting. (Patient not taking: Reported on 07/06/2020), Disp: 20 tablet, Rfl: 0 Social History   Socioeconomic History  . Marital status: Married    Spouse name: Ronalee Belts  . Number of children: 1  . Years of education: 80  . Highest education level: Bachelor's degree (e.g., BA, AB, BS)  Occupational History  . Occupation: Retired     Comment: Pharmacist, hospital  Tobacco Use  . Smoking status: Never Smoker  . Smokeless tobacco: Never Used  Vaping Use  . Vaping Use: Never used  Substance and Sexual Activity  . Alcohol use: No  . Drug use: No  . Sexual activity: Not Currently    Birth control/protection: None    Comment: not much  Other Topics Concern  . Not on file  Social History Narrative   Lives with husband.     Social Determinants of Health   Financial Resource Strain:   . Difficulty of Paying Living Expenses: Not on file  Food Insecurity:   . Worried About Charity fundraiser in the Last Year: Not on file  . Ran Out of Food in the Last Year: Not on file  Transportation Needs:   . Lack of Transportation (Medical): Not on file  . Lack of Transportation (Non-Medical): Not on file  Physical Activity:   . Days of Exercise per Week: Not on file  . Minutes of Exercise per Session: Not on file  Stress:   . Feeling of Stress : Not on file  Social Connections:   . Frequency of Communication with Friends and Family: Not on file  . Frequency of Social Gatherings with Friends and Family: Not on file  . Attends Religious Services: Not on file  . Active Member of Clubs or Organizations: Not on file  . Attends Archivist Meetings: Not on file  . Marital Status: Not on file  Intimate Partner Violence:   . Fear of Current or Ex-Partner: Not on file  . Emotionally Abused: Not on file  . Physically Abused: Not on file  . Sexually Abused: Not on file   Family History  Problem Relation Age of Onset  . COPD Mother         smoked  . Parkinsonism Mother   . Arthritis Mother   . Epilepsy Mother        early in 55's  . Myasthenia gravis Father   . Asthma Father   . Emphysema Father        smoked  . Glaucoma Father   .  Arthritis Brother        knees  . Heart disease Brother   . Diabetes Brother   . Hyperlipidemia Brother   . Hyperlipidemia Brother   . Hyperlipidemia Brother   . Heart disease Brother   . Hyperlipidemia Brother   . CAD Paternal Grandmother 78  . Epilepsy Daughter 32  . Diabetes Daughter   . Kidney disease Maternal Uncle   . Colon cancer Neg Hx     Objective: Office vital signs reviewed. BP 139/87   Pulse 70   Temp 97.7 F (36.5 C) (Temporal)   Ht 5\' 3"  (1.6 m)   Wt 195 lb (88.5 kg)   LMP 04/29/1991   SpO2 99%   BMI 34.54 kg/m   Physical Examination:  General: Awake, alert, No acute distress Cardio: Regular rate and rhythm Pulmonary clear to auscultation bilaterally MSK:   Right lower extremity: No appreciable induration but she has postinflammatory hyperpigmentation noted along the medial aspect of the right lower leg.  No skin breakdown. Neuro: Vibratory sensation intact of the upper extremity  No results found.  Assessment/ Plan: 69 y.o. female   Pain of right lower extremity  Memory problem - Plan: Vitamin B12, Renal Function Panel, Ambulatory referral to Neurology  Vitamin B 12 deficiency - Plan: Vitamin B12  Paresthesia of upper and lower extremities of both sides - Plan: Vitamin B12, Renal Function Panel, Ambulatory referral to Neurology  Postablative hypothyroidism - Plan: Thyroid Panel With TSH  Family history of Parkinson disease - Plan: Ambulatory referral to Neurology  Symptoms in the lower extremities were not thought to be secondary to vascular changes.  She is been evaluated by vascular and her previous interventions were stable.  I am going to check her B12 level since we made a slight adjustment to her frequency of dosing.  She is having  paresthesias of bilateral upper extremities and I am not sure what the etiology of this is.  Her vibratory sensation was intact today. ?  Cervical etiology but given polyneuropathy, I am going to refer to neurology for further evaluation.  There is a family history of Parkinson's disease which may or may not be significant.  I discussed with her that I have not use oxcarbazepine for neuropathy in the past and I was unable to find any literature to support use.  This may be an off label use but I would defer this to neurology for appropriateness. ?  Nerve conduction studies may be of benefit   No orders of the defined types were placed in this encounter.  No orders of the defined types were placed in this encounter.    [name], DO [place] [phone]

## 2020-07-06 NOTE — Patient Instructions (Signed)

## 2020-07-07 LAB — THYROID PANEL WITH TSH
Free Thyroxine Index: 3.1 (ref 1.2–4.9)
T3 Uptake Ratio: 26 % (ref 24–39)
T4, Total: 11.9 ug/dL (ref 4.5–12.0)
TSH: 0.212 u[IU]/mL — ABNORMAL LOW (ref 0.450–4.500)

## 2020-07-07 LAB — RENAL FUNCTION PANEL
Albumin: 4.4 g/dL (ref 3.7–4.7)
BUN/Creatinine Ratio: 14 (ref 12–28)
BUN: 13 mg/dL (ref 8–27)
CO2: 24 mmol/L (ref 20–29)
Calcium: 9.5 mg/dL (ref 8.7–10.3)
Chloride: 103 mmol/L (ref 96–106)
Creatinine, Ser: 0.92 mg/dL (ref 0.57–1.00)
GFR calc Af Amer: 72 mL/min/{1.73_m2} (ref >59)
GFR calc non Af Amer: 63 mL/min/{1.73_m2} (ref >59)
Glucose: 81 mg/dL (ref 65–99)
Phosphorus: 3 mg/dL (ref 3.0–4.3)
Potassium: 4.1 mmol/L (ref 3.5–5.2)
Sodium: 139 mmol/L (ref 134–144)

## 2020-07-07 LAB — VITAMIN B12: Vitamin B-12: 749 pg/mL (ref 232–1245)

## 2020-07-14 ENCOUNTER — Encounter: Payer: Self-pay | Admitting: Family Medicine

## 2020-07-14 ENCOUNTER — Ambulatory Visit (INDEPENDENT_AMBULATORY_CARE_PROVIDER_SITE_OTHER): Payer: Medicare PPO | Admitting: Family Medicine

## 2020-07-14 DIAGNOSIS — L509 Urticaria, unspecified: Secondary | ICD-10-CM | POA: Diagnosis not present

## 2020-07-14 MED ORDER — LORATADINE 10 MG PO TABS: 10.0000 mg | ORAL_TABLET | Freq: Every day | ORAL | 1 refills | Status: DC

## 2020-07-14 NOTE — Progress Notes (Signed)
   Virtual Visit via telephone Note  I connected with Miranda Wilson on 07/14/20 at 1815 by telephone and verified that I am speaking with the correct person using two identifiers. Miranda Wilson is currently located at home and patient are currently with her during visit. The provider, Fransisca Kaufmann Arel Tippen, MD is located in their office at time of visit.  Call ended at 1825  I discussed the limitations, risks, security and privacy concerns of performing an evaluation and management service by telephone and the availability of in person appointments. I also discussed with the patient that there may be a patient responsible charge related to this service. The patient expressed understanding and agreed to proceed.   History and Present Illness: Patient is calling in for allergic reaction.  She noticed a bug on her right hand and it stung her.  This occurred 5 days ago. She washed it with soap and water and put ice and cortisone cream and that was helping.  4 days ago she started having whelps on her abdomen and put nystatin and triamcinolone on it. She said the rash and whelps were worsening on her abdomen.  She said that is worsening and is now on her legs and inner thighs and had some dizziness. She took benadryl and the whelps are still coming back at times. Benadryl suppress the whelps but then they come back   No diagnosis found.    Review of Systems  Constitutional: Negative for chills and fever.  Eyes: Negative for redness and visual disturbance.  Respiratory: Negative for chest tightness and shortness of breath.   Cardiovascular: Negative for chest pain and leg swelling.  Musculoskeletal: Negative for back pain and gait problem.  Skin: Positive for rash.  Neurological: Positive for dizziness. Negative for light-headedness and headaches.  Psychiatric/Behavioral: Negative for agitation and behavioral problems.  All other systems reviewed and are  negative.   Observations/Objective: Patient sounds comfortable and in no acute distress  Assessment and Plan: Problem List Items Addressed This Visit    None    Visit Diagnoses    Hives    -  Primary   Relevant Medications   loratadine (CLARITIN) 10 MG tablet      Will treat like possible allergic reaction to bug bite. Follow up plan: Return if symptoms worsen or fail to improve.     I discussed the assessment and treatment plan with the patient. The patient was provided an opportunity to ask questions and all were answered. The patient agreed with the plan and demonstrated an understanding of the instructions.   The patient was advised to call back or seek an in-person evaluation if the symptoms worsen or if the condition fails to improve as anticipated.  The above assessment and management plan was discussed with the patient. The patient verbalized understanding of and has agreed to the management plan. Patient is aware to call the clinic if symptoms persist or worsen. Patient is aware when to return to the clinic for a follow-up visit. Patient educated on when it is appropriate to go to the emergency department.    I provided 10 minutes of non-face-to-face time during this encounter.    Miranda Rancher, MD

## 2020-07-16 ENCOUNTER — Encounter: Payer: Self-pay | Admitting: Family Medicine

## 2020-08-01 ENCOUNTER — Encounter: Payer: Self-pay | Admitting: Family Medicine

## 2020-08-11 ENCOUNTER — Other Ambulatory Visit: Payer: Self-pay | Admitting: Family Medicine

## 2020-10-01 ENCOUNTER — Other Ambulatory Visit: Payer: Self-pay | Admitting: Family Medicine

## 2020-10-01 DIAGNOSIS — I10 Essential (primary) hypertension: Secondary | ICD-10-CM

## 2020-10-11 ENCOUNTER — Telehealth: Payer: Self-pay | Admitting: Family Medicine

## 2020-10-11 NOTE — Telephone Encounter (Signed)
Pt scheduled with Dr Darnell Level 10/25/20 at 4:00

## 2020-10-11 NOTE — Telephone Encounter (Signed)
Pt is having leg pain along with redness and a knot in her leg, has been seen for this issue before. Has been to a dermatologist where they gave her a Cortizone cream

## 2020-10-13 ENCOUNTER — Telehealth: Payer: Self-pay

## 2020-10-13 NOTE — Telephone Encounter (Signed)
Please advise if note is okay?

## 2020-10-14 NOTE — Telephone Encounter (Signed)
ok 

## 2020-10-14 NOTE — Telephone Encounter (Signed)
Note done and patient aware.

## 2020-10-25 ENCOUNTER — Encounter: Payer: Self-pay | Admitting: Family Medicine

## 2020-10-25 ENCOUNTER — Ambulatory Visit: Payer: Medicare PPO | Admitting: Family Medicine

## 2020-10-25 ENCOUNTER — Other Ambulatory Visit: Payer: Self-pay

## 2020-10-25 VITALS — BP 118/68 | HR 70 | Temp 97.2°F | Ht 63.0 in | Wt 195.0 lb

## 2020-10-25 DIAGNOSIS — R2242 Localized swelling, mass and lump, left lower limb: Secondary | ICD-10-CM

## 2020-10-25 DIAGNOSIS — M793 Panniculitis, unspecified: Secondary | ICD-10-CM | POA: Diagnosis not present

## 2020-10-25 DIAGNOSIS — E890 Postprocedural hypothyroidism: Secondary | ICD-10-CM

## 2020-10-25 NOTE — Progress Notes (Signed)
Subjective: CC: Follow-up knots on legs PCP: [name], DO BE:[id number] Miranda Wilson is a 69 y.o. female presenting to clinic today for:  1. Leg lesions Patient reports ongoing/recurrent lesions on her legs.  They are hard knots that are just under the skin that primarily affected the left lower extremity and resolved after she was treated with Omnicef and prednisone for another illness.  However, they recurred.  They are tender.  She started having 1 on the right lower extremity yesterday.  She reports feeling very worried about this because several family members of hers have lost lower extremities due to other illnesses.  She has been using the topical triamcinolone but this has not been especially helpful.  She is at a loss as to what to do.  She unfortunately was not able to keep her appointment with neurology for nerve conduction studies.  She does continue to have some nerve issues in extremities.  She saw her vascular specialist but they did not find any compromise of her previous vascular interventions.   2.  Hypothyroidism Patient is compliant with her Synthroid.  No reports of heart palpitations or tremor.   ROS: Per HPI  Allergies  Allergen Reactions  . Clindamycin/Lincomycin Other (See Comments)    Dizzy spells  . Codeine Nausea And Vomiting  . Crestor [Rosuvastatin Calcium] Other (See Comments)    Aching flu like large dose   . Lipitor [Atorvastatin Calcium] Other (See Comments)    Numbness in feet   . Pravachol Other (See Comments)    Back pain    . Simvastatin Other (See Comments)    Back pain    . Ultram [Tramadol Hcl] Nausea And Vomiting  . Vioxx [Rofecoxib] Other (See Comments)    Fluid rentention , elevated blood pressure   . Feldene [Piroxicam] Rash  . Myrbetriq [Mirabegron] Other (See Comments)    Constipation, headache  . Penicillins Rash    Pt states she CAN take Keflex  . Toviaz [Fesoterodine Fumarate Er] Other (See Comments)    Dry skin, dry  eyes, constipation   Past Medical History:  Diagnosis Date  . At risk for sleep apnea   . Bilateral edema of lower extremity    left > right --  wear compression hose  . Cervical spondylosis   . Chronic constipation   . Cystitis [id number]   Dr Prince Rome - here   . GERD (gastroesophageal reflux disease)   . History of colon polyps   . History of idiopathic seizure    AGE 21 to 20  X5  --  UNKNOW IDIOLOGY , PER PT WAS ANEMIC AT THE TIME--  NONE SINCE  . History of tachycardia    S/P  RADIOACTIVE IODINE ABLATION OF THYROID  . Hyperlipidemia   . Hypertension   . Hypothyroidism, postradioiodine therapy    AGE 68  . IBS (irritable bowel syndrome)   . Neuropathy   . OA (osteoarthritis)    shoulders  left > right  . PMB (postmenopausal bleeding)   . S/P radioactive iodine thyroid ablation   . Urge urinary incontinence   . Varicose veins   . Vein disorder    LEFT ANKLE VEIN VALVE REFLUX WITH DECREASED CIRCULATION     Current Outpatient Medications:  .  Cholecalciferol (VITAMIN D) 2000 units CAPS, Take by mouth., Disp: , Rfl:  .  levothyroxine (SYNTHROID) 150 MCG tablet, TAKE 1/2 TABLET BY MOUTH EVERY DAY EXCEPT 1 TAB ON MON, FRI., Disp: 70 tablet, Rfl: 0 .  loratadine (CLARITIN) 10 MG tablet, Take 1 tablet (10 mg total) by mouth daily. (Patient taking differently: Take 10 mg by mouth daily as needed.), Disp: 30 tablet, Rfl: 1 .  losartan (COZAAR) 25 MG tablet, Take 1 tablet (25 mg total) by mouth daily. (Needs to be seen before next refill), Disp: 30 tablet, Rfl: 0 .  Multiple Vitamins-Minerals (CENTRUM SILVER PO), Take 1 tablet by mouth daily., Disp: , Rfl:  .  ondansetron (ZOFRAN ODT) 4 MG disintegrating tablet, Take 1 tablet (4 mg total) by mouth every 8 (eight) hours as needed for nausea or vomiting., Disp: 20 tablet, Rfl: 0 .  Propylene Glycol (SYSTANE BALANCE OP), Apply 1 drop to eye 2 (two) times daily as needed (Dry eyes). Reported on 01/31/2016, Disp: , Rfl:  .  rosuvastatin  (CRESTOR) 40 MG tablet, Take 1 tablet (40 mg total) by mouth daily. (Patient taking differently: Take 20 mg by mouth daily.), Disp: 30 tablet, Rfl: 4 .  Triamcinolone Acetonide (TRIAMCINOLONE 0.1 % CREAM : EUCERIN) CREA, Apply 1 application topically 2 (two) times daily., Disp: , Rfl:  .  vitamin B-12 (CYANOCOBALAMIN) 1000 MCG tablet, Take 1,000 mcg by mouth every other day., Disp: , Rfl:  Social History   Socioeconomic History  . Marital status: Married    Spouse name: Ronalee Belts  . Number of children: 1  . Years of education: 45  . Highest education level: Bachelor's degree (e.g., BA, AB, BS)  Occupational History  . Occupation: Retired     Comment: Pharmacist, hospital  Tobacco Use  . Smoking status: Never Smoker  . Smokeless tobacco: Never Used  Vaping Use  . Vaping Use: Never used  Substance and Sexual Activity  . Alcohol use: No  . Drug use: No  . Sexual activity: Not Currently    Birth control/protection: None    Comment: not much  Other Topics Concern  . Not on file  Social History Narrative   Lives with husband.     Social Determinants of Health   Financial Resource Strain: Not on file  Food Insecurity: Not on file  Transportation Needs: Not on file  Physical Activity: Not on file  Stress: Not on file  Social Connections: Not on file  Intimate Partner Violence: Not on file   Family History  Problem Relation Age of Onset  . COPD Mother        smoked  . Parkinsonism Mother   . Arthritis Mother   . Epilepsy Mother        early in 83's  . Myasthenia gravis Father   . Asthma Father   . Emphysema Father        smoked  . Glaucoma Father   . Arthritis Brother        knees  . Heart disease Brother   . Diabetes Brother   . Hyperlipidemia Brother   . Hyperlipidemia Brother   . Hyperlipidemia Brother   . Heart disease Brother   . Hyperlipidemia Brother   . CAD Paternal Grandmother 98  . Epilepsy Daughter 40  . Diabetes Daughter   . Kidney disease Maternal Uncle   . Colon  cancer Neg Hx     Objective: Office vital signs reviewed. BP 118/68   Pulse 70   Temp (!) 97.2 F (36.2 C)   Ht 5\' 3"  (1.6 m)   Wt 195 lb (88.5 kg)   LMP 04/29/1991   SpO2 97%   BMI 34.54 kg/m   Physical Examination:  General: Awake, alert, well  nourished, No acute distress HEENT: Normal; no exophthalmos or goiter Cardio: regular rate Pulm:  normal work of breathing on room air Skin: She has hyperpigmentation noted along the anterior shins bilaterally.  There are palpable soft tissue, subcutaneous nodules that are tender on exam today.  These affect the left lower extremity more than the right but there is a lesion present on the right lower extremity today  Assessment/ Plan: 69 y.o. female   Subcutaneous nodule of left lower leg - Plan: Sedimentation Rate, C-reactive protein, CBC with Differential, DG Chest 2 View  Panniculitis  Postablative hypothyroidism - Plan: Thyroid Panel With TSH  Her lower extremity lesions seem to be consistent with panniculitis.  Ongoing to evaluate for possible inflammatory etiology.  I considered erythema nodosum as a possible diagnosis of this patient.  Differential diagnosis also to consider include gout and cutaneous polyarteritis nodosa.  However, this is most likely consistent with a stasis dermatitis, particularly given patient's history of vascular interventions.  If her labs today are unrevealing, could consider addition of uric acid to her next set of labs.  I find it interesting that she responded to the prednisone.  I wonder if placing her on an NSAID would be helpful.  However, I would worry about worsening any edema.  She is relatively asymptomatic from a thyroid standpoint.  Check thyroid panel   No orders of the defined types were placed in this encounter.  No orders of the defined types were placed in this encounter.    Raliegh Ip, DO [facility] [phone]

## 2020-10-25 NOTE — Patient Instructions (Signed)
Erythema Nodosum Erythema nodosum is a skin condition in which patches of fat under the skin of the lower legs become inflamed. This causes painful bumps (nodules) to form. What are the causes? This condition may be caused by:  Infections. Strep throat (pharyngitis) is a common cause.  Certain medicines, especially birth control pills, penicillin, and sulfa medicines.  Sarcoidosis.  Pregnancy.  Certain inflammatory conditions, such as Crohn's disease.  Certain cancers. In some cases, the cause may not be known. What increases the risk? This condition is more likely to develop in young adult women. What are the signs or symptoms? The nodules from erythema nodosum:  Look like raised bruises and are tender to the touch.  Usually appear on the front of the lower legs (shins) and may also appear on the arms or the abdomen.  Gradually change in color from pink to brown.  Leave a dark mark that fades away after several months. Other symptoms besides nodules may include:  Fever.  Fatigue.  Joint pain. How is this diagnosed? This condition is often diagnosed based on your symptoms. You may have a physical exam, X-rays, and blood tests to help find the cause of your erythema nodosum. A skin sample may be removed (skin biopsy) to be examined by a specialist (pathologist). How is this treated? Treatment for this condition depends on the cause. The nodules usually go away after the underlying cause is treated, such as after medicine is used to fight infection. Treatment may also include:  NSAIDs for pain and inflammation.  Potassium iodide supplements.  Steroid medicines.  Rest. Follow these instructions at home: Activity  Rest and return to your normal activities as told by your health care provider. Ask your health care provider what activities are safe for you.  Avoid very intense (vigorous) exercise until your symptoms go away. General instructions   Take  over-the-counter and prescription medicines only as told by your health care provider. If you were prescribed antibiotic medicine, take or apply it as told by your health care provider. Do not stop using the antibiotic even if you start to feel better  If directed, put ice on affected areas to help relieve pain or inflammation: ? Put ice in a plastic bag. ? Place a towel between your skin and the bag. ? Apply ice 2-3 times a day. Do not apply ice for more than 20 minutes at a time.  If possible, raise (elevate) the affected area above the level of your heart when you are sitting or lying down. This may help with pain and inflammation.  Avoid scratching your skin.  To relieve itchiness, make a paste with dry oatmeal and warm water, then put the paste on itchy areas. Let the paste dry, remove it, and then apply moisturizer. You may do this 2-3 times a day, or as needed.  Keep all follow-up visits as told by your health care provider. This is important. Contact a health care provider if you:  Have symptoms that do not get better with treatment and home care.  Have a fever that does not go away. Get help right away if you:  Have pain that gets worse.  Have a sore throat.  Vomit more than one time. Summary  Erythema nodosum is a skin condition that causes painful bumps (nodules) to form.  The bumps usually appear on the front of the lower legs (shins).  Avoid very intense (vigorous) exercise until your symptoms go away.  Contact a health care provider if   you have a fever or if your symptoms do not improve. This information is not intended to replace advice given to you by your health care provider. Make sure you discuss any questions you have with your health care provider. Document Revised: 09/27/2017 Document Reviewed: 08/20/2017 Elsevier Patient Education  2020 Elsevier Inc.  

## 2020-10-26 ENCOUNTER — Telehealth: Payer: Self-pay

## 2020-10-26 DIAGNOSIS — R202 Paresthesia of skin: Secondary | ICD-10-CM

## 2020-10-26 DIAGNOSIS — Z820 Family history of epilepsy and other diseases of the nervous system: Secondary | ICD-10-CM

## 2020-10-26 LAB — CBC WITH DIFFERENTIAL/PLATELET
Basophils Absolute: 0 10*3/uL (ref 0.0–0.2)
Basos: 1 %
EOS (ABSOLUTE): 0.1 10*3/uL (ref 0.0–0.4)
Eos: 3 %
Hematocrit: 41.2 % (ref 34.0–46.6)
Hemoglobin: 13.5 g/dL (ref 11.1–15.9)
Immature Grans (Abs): 0 10*3/uL (ref 0.0–0.1)
Immature Granulocytes: 0 %
Lymphocytes Absolute: 1.8 10*3/uL (ref 0.7–3.1)
Lymphs: 33 %
MCH: 27.8 pg (ref 26.6–33.0)
MCHC: 32.8 g/dL (ref 31.5–35.7)
MCV: 85 fL (ref 79–97)
Monocytes Absolute: 0.6 10*3/uL (ref 0.1–0.9)
Monocytes: 11 %
Neutrophils Absolute: 2.8 10*3/uL (ref 1.4–7.0)
Neutrophils: 52 %
Platelets: 217 10*3/uL (ref 150–450)
RBC: 4.85 x10E6/uL (ref 3.77–5.28)
RDW: 14.1 % (ref 11.7–15.4)
WBC: 5.3 10*3/uL (ref 3.4–10.8)

## 2020-10-26 LAB — THYROID PANEL WITH TSH
Free Thyroxine Index: 2.6 (ref 1.2–4.9)
T3 Uptake Ratio: 25 % (ref 24–39)
T4, Total: 10.2 ug/dL (ref 4.5–12.0)
TSH: 0.693 u[IU]/mL (ref 0.450–4.500)

## 2020-10-26 LAB — SEDIMENTATION RATE: Sed Rate: 5 mm/hr (ref 0–40)

## 2020-10-26 LAB — C-REACTIVE PROTEIN: CRP: 2 mg/L (ref 0–10)

## 2020-10-26 NOTE — Telephone Encounter (Signed)
Pt saw Dr Nadine Counts for appt yesterday and was told to call [facility] to make an appt since referral had already been placed back in sept for her. Pt says she called their office and was told that because referral was sent more than 3 months ago, a new referral would need to be sent to them before they could schedule pt.

## 2020-10-26 NOTE — Telephone Encounter (Signed)
Can you please place new referral for Neurology

## 2020-10-26 NOTE — Telephone Encounter (Signed)
Can we resend the referral or does [facility] need to place a new one for the patient

## 2020-11-04 ENCOUNTER — Ambulatory Visit (INDEPENDENT_AMBULATORY_CARE_PROVIDER_SITE_OTHER): Payer: Medicare PPO

## 2020-11-04 ENCOUNTER — Encounter: Payer: Self-pay | Admitting: Neurology

## 2020-11-04 DIAGNOSIS — R2242 Localized swelling, mass and lump, left lower limb: Secondary | ICD-10-CM

## 2020-11-04 DIAGNOSIS — Z0389 Encounter for observation for other suspected diseases and conditions ruled out: Secondary | ICD-10-CM | POA: Diagnosis not present

## 2020-11-08 ENCOUNTER — Other Ambulatory Visit: Payer: Self-pay | Admitting: Family Medicine

## 2020-11-12 ENCOUNTER — Other Ambulatory Visit: Payer: Self-pay | Admitting: Family Medicine

## 2020-11-12 DIAGNOSIS — I10 Essential (primary) hypertension: Secondary | ICD-10-CM

## 2020-11-21 ENCOUNTER — Encounter: Payer: Self-pay | Admitting: Neurology

## 2020-11-21 ENCOUNTER — Other Ambulatory Visit: Payer: Self-pay

## 2020-11-21 ENCOUNTER — Ambulatory Visit: Payer: Medicare PPO | Admitting: Neurology

## 2020-11-21 VITALS — BP 186/94 | HR 68 | Ht 63.0 in | Wt 197.0 lb

## 2020-11-21 DIAGNOSIS — R202 Paresthesia of skin: Secondary | ICD-10-CM | POA: Diagnosis not present

## 2020-11-21 DIAGNOSIS — G629 Polyneuropathy, unspecified: Secondary | ICD-10-CM | POA: Diagnosis not present

## 2020-11-21 NOTE — Progress Notes (Signed)
[facility] Clinic Note - Initial Visit   Date: 11/21/20  Miranda Wilson MRN: [id number] DOB: 1951/07/16   Dear Dr. Lajuana Ripple:  Thank you for your kind referral of Miranda Wilson for consultation of bilateral hand and feet pain. Although her history is well known to you, please allow [place] to reiterate it for the purpose of our medical record. The patient was accompanied to the clinic by self.  History of Present Illness: Miranda Wilson is a 71 y.o. right-handed female with varicose veins s/p ablation, hypothyroidism, hypertension, and hyperlipidemia presenting for evaluation of bilateral hand and feet pain.  Starting around late 2020, she began having stinging and burning pain over the toes, top of the feet and lower legs.  It is constant and is worse with prolonged standing. She has imbalance and uses since 2011 because of right knee surgery.  No falls. She was evaluated by her vascular surgeon suggested symptoms were due to neuropathy, not her varicose veins.  She does not take any thing for pain, but has questions if oxcarbamazepine and electrical stimulation would be helpful. Over the past year, she has developed similar symptoms in the hands.  She also complains of numbness/tingling in the hands and drops things often.   She also complains of skin color changes involving the toes where it goes from purple to redness.  No history of diabetes or alcohol abuse.   Out-side paper records, electronic medical record, and images have been reviewed where available and summarized as:  Lab Results  Component Value Date   HGBA1C 5.7 01/04/2020   Lab Results  Component Value Date   WUJWJXBJ47 829 07/06/2020   Lab Results  Component Value Date   TSH 0.693 10/25/2020   Lab Results  Component Value Date   ESRSEDRATE 5 10/25/2020    Past Medical History:  Diagnosis Date  . At risk for sleep apnea   . Bilateral edema of lower extremity    left > right --   wear compression hose  . Cervical spondylosis   . Chronic constipation   . Cystitis [id number]'V   Dr Prince Rome - here   . GERD (gastroesophageal reflux disease)   . History of colon polyps   . History of idiopathic seizure    AGE 14 to 20  X5  --  UNKNOW IDIOLOGY , PER PT WAS ANEMIC AT THE TIME--  NONE SINCE  . History of tachycardia    S/P  RADIOACTIVE IODINE ABLATION OF THYROID  . Hyperlipidemia   . Hypertension   . Hypothyroidism, postradioiodine therapy    AGE 2  . IBS (irritable bowel syndrome)   . Neuropathy   . OA (osteoarthritis)    shoulders  left > right  . PMB (postmenopausal bleeding)   . S/P radioactive iodine thyroid ablation   . Urge urinary incontinence   . Varicose veins   . Vein disorder    LEFT ANKLE VEIN VALVE REFLUX WITH DECREASED CIRCULATION     Past Surgical History:  Procedure Laterality Date  . CATARACT EXTRACTION W/ INTRAOCULAR LENS  IMPLANT, BILATERAL  right 2011//  left 2013  . CESAREAN SECTION  1979  . CLOSED LEFT KNEE MANIPULATION AND RIGHT KNEE CORTISONE INJECTION  02-20-2010  . COLONOSCOPY W/ POLYPECTOMY  10-25-2008  . DILATION AND CURETTAGE OF UTERUS    . ENDOVENOUS ABLATION SAPHENOUS VEIN W/ LASER  04/2007  . EYE SURGERY Bilateral    cataract  . EYE SURGERY Bilateral    eyelid  lifts /// left eye - placed removed  . HYSTEROSCOPY WITH D & C N/A 11/11/2014   Procedure: DILATATION AND CURETTAGE /HYSTEROSCOPY;  Surgeon: Gus Height, MD;  Location: [facility];  Service: Gynecology;  Laterality: N/A;  . JOINT REPLACEMENT    . LAPAROSCOPIC CHOLECYSTECTOMY  1993   and BILATERAL TUBAL LIGATION  . LEFT KNEE ARTHROTOMY W/ LYSIS ADHESIONS  12-01-2010  . MOUTH SURGERY     dental implants  . TONSILLECTOMY  1955  . TOTAL KNEE ARTHROPLASTY Left 12-19-2009  . TOTAL KNEE ARTHROPLASTY Right 02/13/2016   Procedure: RIGHT TOTAL KNEE ARTHROPLASTY;  Surgeon: Gaynelle Arabian, MD;  Location: [facility];  Service: Orthopedics;  Laterality: Right;  . TUBAL  LIGATION    . [place] to 2010   includes laser and phlebectomies     Medications:  Outpatient Encounter Medications as of 11/21/2020  Medication Sig  . Cholecalciferol (VITAMIN D) 2000 units CAPS Take by mouth.  . levothyroxine (SYNTHROID) 150 MCG tablet TAKE 1/2 TABLET BY MOUTH EVERY DAY EXCEPT 1 TAB ON MON, FRI.  Marland Kitchen loratadine (CLARITIN) 10 MG tablet Take 1 tablet (10 mg total) by mouth daily. (Patient taking differently: Take 10 mg by mouth daily as needed.)  . losartan (COZAAR) 25 MG tablet Take 1 tablet (25 mg total) by mouth daily.  . Multiple Vitamins-Minerals (CENTRUM SILVER PO) Take 1 tablet by mouth daily.  . Nabumetone (RELAFEN PO) Take by mouth.  . ondansetron (ZOFRAN ODT) 4 MG disintegrating tablet Take 1 tablet (4 mg total) by mouth every 8 (eight) hours as needed for nausea or vomiting.  Marland Kitchen Propylene Glycol (SYSTANE BALANCE OP) Apply 1 drop to eye 2 (two) times daily as needed (Dry eyes). Reported on 01/31/2016  . rosuvastatin (CRESTOR) 40 MG tablet Take 1 tablet (40 mg total) by mouth daily. (Patient taking differently: Take 20 mg by mouth daily. Take half a tablet (20 mg) daily)  . Triamcinolone Acetonide (TRIAMCINOLONE 0.1 % CREAM : EUCERIN) CREA Apply 1 application topically 2 (two) times daily.  . vitamin B-12 (CYANOCOBALAMIN) 1000 MCG tablet Take 1,000 mcg by mouth every other day. Take one tablet every other day   No facility-administered encounter medications on file as of 11/21/2020.    Allergies:  Allergies  Allergen Reactions  . Clindamycin/Lincomycin Other (See Comments)    Dizzy spells  . Codeine Nausea And Vomiting  . Crestor [Rosuvastatin Calcium] Other (See Comments)    Aching flu like large dose   . Lipitor [Atorvastatin Calcium] Other (See Comments)    Numbness in feet   . Pravachol Other (See Comments)    Back pain    . Simvastatin Other (See Comments)    Back pain    . Ultram [Tramadol Hcl] Nausea And Vomiting  . Vioxx  [Rofecoxib] Other (See Comments)    Fluid rentention , elevated blood pressure   . Feldene [Piroxicam] Rash  . Myrbetriq [Mirabegron] Other (See Comments)    Constipation, headache  . Penicillins Rash    Pt states she CAN take Keflex  . Toviaz [Fesoterodine Fumarate Er] Other (See Comments)    Dry skin, dry eyes, constipation    Family History: Family History  Problem Relation Age of Onset  . COPD Mother        smoked  . Parkinsonism Mother   . Arthritis Mother   . Epilepsy Mother        early in 52's  . Myasthenia gravis Father   . Asthma  Father   . Emphysema Father        smoked  . Glaucoma Father   . Arthritis Brother        knees  . Heart disease Brother   . Diabetes Brother   . Hyperlipidemia Brother   . Hyperlipidemia Brother   . Hyperlipidemia Brother   . Heart disease Brother   . Hyperlipidemia Brother   . CAD Paternal Grandmother 29  . Epilepsy Daughter 32  . Diabetes Daughter   . Kidney disease Maternal Uncle   . Colon cancer Neg Hx     Social History: Social History   Tobacco Use  . Smoking status: Never Smoker  . Smokeless tobacco: Never Used  Vaping Use  . Vaping Use: Never used  Substance Use Topics  . Alcohol use: No  . Drug use: No   Social History   Social History Narrative   Lives with husband in a one story home. Back end of home has steps leading out to a deck.    Right handed   Drinks a lot of caffiene    Vital Signs:  BP (!) 186/94   Pulse 68   Ht 5\' 3"  (1.6 m)   Wt 197 lb (89.4 kg)   LMP 04/29/1991   SpO2 99%   BMI 34.90 kg/m    Neurological Exam: MENTAL STATUS including orientation to time, place, person, recent and remote memory, attention span and concentration, language, and fund of knowledge is normal.  Speech is not dysarthric.  CRANIAL NERVES: II:  No visual field defects.   III-IV-VI: Pupils equal round and reactive to light.  Normal conjugate, extra-ocular eye movements in all directions of gaze.  No  nystagmus.  No ptosis.   V:  Normal facial sensation.    VII:  Normal facial symmetry and movements.   VIII:  Normal hearing and vestibular function.   IX-X:  Normal palatal movement.   XI:  Normal shoulder shrug and head rotation.   XII:  Normal tongue strength and range of motion, no deviation or fasciculation.  MOTOR:  No atrophy, fasciculations or abnormal movements.  No pronator drift.   Upper Extremity:  Right  Left  Deltoid  5/5   5/5   Biceps  5/5   5/5   Triceps  5/5   5/5   Infraspinatus 5/5  5/5  Medial pectoralis 5/5  5/5  Wrist extensors  5/5   5/5   Wrist flexors  5/5   5/5   Finger extensors  5/5   5/5   Finger flexors  5/5   5/5   Dorsal interossei  5/5   5/5   Abductor pollicis  5/5   5/5   Tone (Ashworth scale)  0  0   Lower Extremity:  Right  Left  Hip flexors  5/5   5/5   Hip extensors  5/5   5/5   Adductor 5/5  5/5  Abductor 5/5  5/5  Knee flexors  5/5   5/5   Knee extensors  5/5   5/5   Dorsiflexors  5/5   5/5   Plantarflexors  5/5   5/5   Toe extensors  5/5   5/5   Toe flexors  5/5   5/5   Tone (Ashworth scale)  0  0   MSRs:  Right        Left                  brachioradialis 2+  2+  biceps 2+  2+  triceps 2+  2+  patellar 2+  2+  ankle jerk 2+  2+  Hoffman no  no  plantar response down  down   SENSORY:  Reduced pin prick, temperature, and vibration below the ankles.  COORDINATION/GAIT: Normal finger-to- nose-finger.  Intact rapid alternating movements bilaterally.    Gait mildly wide-based, assisted with cane.   IMPRESSION: Probable peripheral neuropathy affecting the feet >> hands, although entrapment neuropathy such as carpal tunnel syndrome in the hands cannot be excluded.  She may also have some lumbar canal stenosis as her reflexes are brisker than what would be expected with neuropathy.   PLAN/RECOMMENDATIONS:  NCS/EMG of the right arm and leg to characterize the nature of her symptoms Discussed the role of medications such as  gabapentin and oxcarbamazepine, she would like to hold off at this time If EDX confirms the presence of neuropathy, check additional neuropathy labs Alternative treatment options such as injections, laser, etc discouraged  Further recommendations pending results.   Thank you for allowing me to participate in patient's care.  If I can answer any additional questions, I would be pleased to do so.    Sincerely,    Josearmando Kuhnert K. Posey Pronto, DO

## 2020-11-21 NOTE — Patient Instructions (Signed)
Nerve testing of the left arm and leg.  Do not apply moisturizer, lotion, or oil to your skin on the day of testing.  ELECTROMYOGRAM AND NERVE CONDUCTION STUDIES (EMG/NCS) INSTRUCTIONS  How to Prepare The neurologist conducting the EMG will need to know if you have certain medical conditions. Tell the neurologist and other EMG lab personnel if you: . Have a pacemaker or any other electrical medical device . Take blood-thinning medications . Have hemophilia, a blood-clotting disorder that causes prolonged bleeding Bathing Take a shower or bath shortly before your exam in order to remove oils from your skin. Don't apply lotions or creams before the exam.  What to Expect You'll likely be asked to change into a hospital gown for the procedure and lie down on an examination table. The following explanations can help you understand what will happen during the exam.  . Electrodes. The neurologist or a technician places surface electrodes at various locations on your skin depending on where you're experiencing symptoms. Or the neurologist may insert needle electrodes at different sites depending on your symptoms.  . Sensations. The electrodes will at times transmit a tiny electrical current that you may feel as a twinge or spasm. The needle electrode may cause discomfort or pain that usually ends shortly after the needle is removed. If you are concerned about discomfort or pain, you may want to talk to the neurologist about taking a short break during the exam.  . Instructions. During the needle EMG, the neurologist will assess whether there is any spontaneous electrical activity when the muscle is at rest - activity that isn't present in healthy muscle tissue - and the degree of activity when you slightly contract the muscle.  He or she will give you instructions on resting and contracting a muscle at appropriate times. Depending on what muscles and nerves the neurologist is examining, he or she may ask  you to change positions during the exam.  After your EMG You may experience some temporary, minor bruising where the needle electrode was inserted into your muscle. This bruising should fade within several days. If it persists, contact your primary care doctor.

## 2020-12-06 ENCOUNTER — Encounter: Payer: Self-pay | Admitting: Family Medicine

## 2020-12-06 ENCOUNTER — Other Ambulatory Visit: Payer: Self-pay

## 2020-12-06 ENCOUNTER — Ambulatory Visit: Payer: Medicare PPO | Admitting: Family Medicine

## 2020-12-06 VITALS — BP 115/78 | HR 65 | Temp 97.5°F | Ht 63.0 in | Wt 198.6 lb

## 2020-12-06 DIAGNOSIS — E890 Postprocedural hypothyroidism: Secondary | ICD-10-CM | POA: Diagnosis not present

## 2020-12-06 DIAGNOSIS — I10 Essential (primary) hypertension: Secondary | ICD-10-CM | POA: Diagnosis not present

## 2020-12-06 DIAGNOSIS — R202 Paresthesia of skin: Secondary | ICD-10-CM | POA: Diagnosis not present

## 2020-12-06 DIAGNOSIS — R2242 Localized swelling, mass and lump, left lower limb: Secondary | ICD-10-CM

## 2020-12-06 NOTE — Progress Notes (Signed)
Subjective: CC: Leg nodules PCP: [name], DO [id number] Miranda Wilson is a 70 y.o. female presenting to clinic today for:  1.  Leg nodules Patient was evaluated about 6 weeks ago and noted to have quite a few nodules along bilateral lower extremities with left being affected more than right.  There is concern for possible erythema nodosum given presentation.  She had previously responded well to prednisone.  She has known history of vascular surgery but her vascular flow was felt to be normal.  She was recently evaluated by neurology for paresthesias and there are plans for EMG.  Patient reports that symptoms have been essentially stable.  The lesions on the right lower extremity seem to be flattening out but she still has discoloration in that area.  She is not had overt pain except for the nodules being pressed against.  She has been seen by dermatology for these lesions in the past but no formal biopsy has been obtained.  EMGs are planned for March with her specialist.  She was explained that this is more for diagnosis but treatment would be symptomatic in nature.  2.  Hypothyroidism/hair thinning Patient reports that she has ongoing hair thinning and wonders if this is due to her thyroid.  She was worried that she did not have a T3 checked.  To summarize she has had history of ablation for hyperthyroidism.  She is treated with Synthroid and is compliant with this.  Not taking any biotin-containing products.  Last TSH showed a low end of normal TSH with mid to upper normal levels of T4.  Nothing to suggest an undertreated thyroid.  She does use well water.  No recent testing.  She admits that there is quite a bit of calcium and Lyme on her showerhead   ROS: Per HPI  Allergies  Allergen Reactions  . Clindamycin/Lincomycin Other (See Comments)    Dizzy spells  . Codeine Nausea And Vomiting  . Crestor [Rosuvastatin Calcium] Other (See Comments)    Aching flu like large dose    . Lipitor [Atorvastatin Calcium] Other (See Comments)    Numbness in feet   . Pravachol Other (See Comments)    Back pain    . Simvastatin Other (See Comments)    Back pain    . Ultram [Tramadol Hcl] Nausea And Vomiting  . Vioxx [Rofecoxib] Other (See Comments)    Fluid rentention , elevated blood pressure   . Feldene [Piroxicam] Rash  . Myrbetriq [Mirabegron] Other (See Comments)    Constipation, headache  . Penicillins Rash    Pt states she CAN take Keflex  . Toviaz [Fesoterodine Fumarate Er] Other (See Comments)    Dry skin, dry eyes, constipation   Past Medical History:  Diagnosis Date  . At risk for sleep apnea   . Bilateral edema of lower extremity    left > right --  wear compression hose  . Cervical spondylosis   . Chronic constipation   . Cystitis [id number]   Dr Prince Rome - here   . GERD (gastroesophageal reflux disease)   . History of colon polyps   . History of idiopathic seizure    AGE 29 to 20  X5  --  UNKNOW IDIOLOGY , PER PT WAS ANEMIC AT THE TIME--  NONE SINCE  . History of tachycardia    S/P  RADIOACTIVE IODINE ABLATION OF THYROID  . Hyperlipidemia   . Hypertension   . Hypothyroidism, postradioiodine therapy    AGE 65  .  IBS (irritable bowel syndrome)   . Neuropathy   . OA (osteoarthritis)    shoulders  left > right  . PMB (postmenopausal bleeding)   . S/P radioactive iodine thyroid ablation   . Urge urinary incontinence   . Varicose veins   . Vein disorder    LEFT ANKLE VEIN VALVE REFLUX WITH DECREASED CIRCULATION     Current Outpatient Medications:  .  Cholecalciferol (VITAMIN D) 2000 units CAPS, Take by mouth., Disp: , Rfl:  .  levothyroxine (SYNTHROID) 150 MCG tablet, TAKE 1/2 TABLET BY MOUTH EVERY DAY EXCEPT 1 TAB ON MON, FRI., Disp: 54 tablet, Rfl: 0 .  loratadine (CLARITIN) 10 MG tablet, Take 1 tablet (10 mg total) by mouth daily. (Patient taking differently: Take 10 mg by mouth daily as needed.), Disp: 30 tablet, Rfl: 1 .  losartan  (COZAAR) 25 MG tablet, Take 1 tablet (25 mg total) by mouth daily., Disp: 30 tablet, Rfl: 0 .  Multiple Vitamins-Minerals (CENTRUM SILVER PO), Take 1 tablet by mouth daily., Disp: , Rfl:  .  Nabumetone (RELAFEN PO), Take by mouth., Disp: , Rfl:  .  ondansetron (ZOFRAN ODT) 4 MG disintegrating tablet, Take 1 tablet (4 mg total) by mouth every 8 (eight) hours as needed for nausea or vomiting., Disp: 20 tablet, Rfl: 0 .  Propylene Glycol (SYSTANE BALANCE OP), Apply 1 drop to eye 2 (two) times daily as needed (Dry eyes). Reported on 01/31/2016, Disp: , Rfl:  .  rosuvastatin (CRESTOR) 40 MG tablet, Take 1 tablet (40 mg total) by mouth daily. (Patient taking differently: Take 20 mg by mouth daily. Take half a tablet (20 mg) daily), Disp: 30 tablet, Rfl: 4 .  Triamcinolone Acetonide (TRIAMCINOLONE 0.1 % CREAM : EUCERIN) CREA, Apply 1 application topically 2 (two) times daily., Disp: , Rfl:  .  vitamin B-12 (CYANOCOBALAMIN) 1000 MCG tablet, Take 1,000 mcg by mouth every other day. Take one tablet every other day, Disp: , Rfl:  Social History   Socioeconomic History  . Marital status: Married    Spouse name: Ronalee Belts  . Number of children: 1  . Years of education: 76  . Highest education level: Bachelor's degree (e.g., BA, AB, BS)  Occupational History  . Occupation: Retired     Comment: Pharmacist, hospital  Tobacco Use  . Smoking status: Never Smoker  . Smokeless tobacco: Never Used  Vaping Use  . Vaping Use: Never used  Substance and Sexual Activity  . Alcohol use: No  . Drug use: No  . Sexual activity: Not Currently    Birth control/protection: None    Comment: not much  Other Topics Concern  . Not on file  Social History Narrative   Lives with husband in a one story home. Back end of home has steps leading out to a deck.    Right handed   Drinks a lot of caffiene   Social Determinants of Health   Financial Resource Strain: Not on file  Food Insecurity: Not on file  Transportation Needs: Not on file   Physical Activity: Not on file  Stress: Not on file  Social Connections: Not on file  Intimate Partner Violence: Not on file   Family History  Problem Relation Age of Onset  . COPD Mother        smoked  . Parkinsonism Mother   . Arthritis Mother   . Epilepsy Mother        early in 53's  . Myasthenia gravis Father   . Asthma Father   .  Emphysema Father        smoked  . Glaucoma Father   . Arthritis Brother        knees  . Heart disease Brother   . Diabetes Brother   . Hyperlipidemia Brother   . Hyperlipidemia Brother   . Hyperlipidemia Brother   . Heart disease Brother   . Hyperlipidemia Brother   . CAD Paternal Grandmother 48  . Epilepsy Daughter 65  . Diabetes Daughter   . Kidney disease Maternal Uncle   . Colon cancer Neg Hx     Objective: Office vital signs reviewed. BP 115/78 Comment: HOME BP  Pulse 65   Temp (!) 97.5 F (36.4 C)   Ht 5\' 3"  (1.6 Miranda)   Wt 198 lb 9.6 oz (90.1 kg)   LMP 04/29/1991   SpO2 100%   BMI 35.18 kg/Miranda   Physical Examination:  General: Awake, alert, No acute distress HEENT: Normal; no exophthalmos.  Scalp without evidence of infection.  Appears healthy Cardio: regular rate and rhythm, S1S2 heard, no murmurs appreciated Pulm: clear to auscultation bilaterally, no wheezes, rhonchi or rales; normal work of breathing on room air MSK: Slow, antalgic gait.  Utilizing cane for ambulation  Assessment/ Plan: 70 y.o. female   Subcutaneous nodule of left lower leg - Plan: Uric Acid  Paresthesia of upper and lower extremities of both sides  Postablative hypothyroidism - Plan: T3, Free, T4, Free, TSH  Essential hypertension, benign  I still think that erythema nodosum is high probability in this patient.  This certainly would explain her nodules.  Thus far she has not been shown to have anything that looks concerning for sarcoidosis.  We discussed that the gold standard of care is use of NSAIDs for these lesions.  I do think that it would  benefit patient to have these biopsied formally with dermatology and she will try and arrange this.  With regards to her paresthesias, EMG is scheduled.  We will obviously treat her symptomatically should she need.  I am glad the check T3, T4 and TSH.  We discussed that T3 would unlikely be abnormal, particularly in the setting of normal T4 and TSH.  Typically T3 is checked in the setting of hyperthyroidism which I doubt is going on in this patient.  I asked that she consider checking her quality of well water as there has been some link to hair loss and thinning due to well water imbalance.  Additionally, stressors and age most certainly could be causing hair loss.  There was nothing on exam to suggest that she has an underlying fungal infection of the scalp that would be contributing to her hair loss.  Blood pressures were elevated here in office but her home blood pressures are under extremely good control.  I do worry if stressors and/or whitecoat syndrome could be impacting blood pressure.  Have asked that she bring in her blood pressure cuff for evaluation against ours to ensure accuracy.  No orders of the defined types were placed in this encounter.  No orders of the defined types were placed in this encounter.    [name], DO [place] [phone]

## 2020-12-07 LAB — T4, FREE: Free T4: 1.51 ng/dL (ref 0.82–1.77)

## 2020-12-07 LAB — URIC ACID: Uric Acid: 4.3 mg/dL (ref 3.1–7.9)

## 2020-12-07 LAB — TSH: TSH: 0.74 u[IU]/mL (ref 0.450–4.500)

## 2020-12-07 LAB — T3, FREE: T3, Free: 2.9 pg/mL (ref 2.0–4.4)

## 2020-12-11 ENCOUNTER — Other Ambulatory Visit: Payer: Self-pay | Admitting: Family Medicine

## 2020-12-11 DIAGNOSIS — I10 Essential (primary) hypertension: Secondary | ICD-10-CM

## 2020-12-17 ENCOUNTER — Encounter: Payer: Self-pay | Admitting: Family Medicine

## 2021-01-03 ENCOUNTER — Other Ambulatory Visit: Payer: Self-pay

## 2021-01-03 ENCOUNTER — Ambulatory Visit: Payer: Medicare PPO | Admitting: Neurology

## 2021-01-03 DIAGNOSIS — R202 Paresthesia of skin: Secondary | ICD-10-CM

## 2021-01-03 DIAGNOSIS — G629 Polyneuropathy, unspecified: Secondary | ICD-10-CM

## 2021-01-03 NOTE — Procedures (Addendum)
[facility]  [address], [address]  [place], [place] [address] Tel: [phone] Fax:  [phone] Test Date:  01/03/2021  Patient: Miranda Wilson DOB: 1951/09/14 Physician: Narda Amber, DO  Sex: Female Height: 5\' 3"  Ref Phys: Narda Amber, DO  ID#: [id number]   Technician:    Patient Complaints: This is a 70 year old female referred for evaluation of numbness and tingling of the hands and feet.  NCV & EMG Findings: Extensive electrodiagnostic testing of the left upper and lower extremity shows:  1. All sensory responses including the left median, ulnar, mixed palmar, sural, and superficial peroneal nerves are within normal limits. 2. All motor responses including the left median, ulnar, peroneal, and tibial nerves are within normal limits. 3. Left tibial H reflex study is within normal limits 4. There is no evidence of active or chronic motor axonal loss changes affecting any of the tested muscles.  Motor unit configuration and recruitment pattern is within normal limits.  Impression: This is a normal study of the left upper and lower extremities.  In particular, there is no evidence of a sensorimotor polyneuropathy or cervical/lumbosacral radiculopathy.   ___________________________ Narda Amber, DO    Nerve Conduction Studies Anti Sensory Summary Table   Stim Site NR Peak (ms) Norm Peak (ms) P-T Amp (V) Norm P-T Amp  Left Median Anti Sensory (2nd Digit)  32C  Wrist    3.3 <3.8 53.4 >10  Left Sup Peroneal Anti Sensory (Ant Lat Mall)  32C  12 cm    2.0 <4.6 5.6 >3  Left Sural Anti Sensory (Lat Mall)  32C  Calf    3.5 <4.6 8.5 >3  Left Ulnar Anti Sensory (5th Digit)  32C  Wrist    3.2 <3.2 16.9 >5   Motor Summary Table   Stim Site NR Onset (ms) Norm Onset (ms) O-P Amp (mV) Norm O-P Amp Site1 Site2 Delta-0 (ms) Dist (cm) Vel (m/s) Norm Vel (m/s)  Left Median Motor (Abd Poll Brev)  32C  Wrist    3.2 <4.0 7.9 >5 Elbow Wrist 4.4 28.0 64 >50  Elbow     7.6  7.3         Left Peroneal Motor (Ext Dig Brev)  32C  Ankle    3.1 <6.0 2.5 >2.5 B Fib Ankle 6.0 31.0 52 >40  B Fib    9.1  2.1  Poplt B Fib 1.3 8.0 62 >40  Poplt    10.4  2.0         Left Tibial Motor (Abd Hall Brev)  32C  Ankle    5.1 <6.0 4.7 >4 Knee Ankle 6.5 41.0 63 >40  Knee    11.6  2.8         Left Ulnar Motor (Abd Dig Minimi)  32C  Wrist    3.0 <3.1 8.3 >7 B Elbow Wrist 3.2 21.0 66 >50  B Elbow    6.2  6.5  A Elbow B Elbow 1.5 10.0 67 >50  A Elbow    7.7  6.3          Comparison Summary Table   Stim Site NR Peak (ms) Norm Peak (ms) P-T Amp (V) Site1 Site2 Delta-P (ms) Norm Delta (ms)  Left Median/Ulnar Palm Comparison (Wrist - 8cm)  32C  Median Palm    1.5 <2.2 96.4 Median Palm Ulnar Palm 0.2   Ulnar Palm    1.7 <2.2 23.2       H Reflex Studies   NR  H-Lat (ms) Lat Norm (ms) L-R H-Lat (ms)  Left Tibial (Gastroc)  32C     29.93 <35    EMG   Side Muscle Ins Act Fibs Psw Fasc Number Recrt Dur Dur. Amp Amp. Poly Poly. Comment  Left AntTibialis Nml Nml Nml Nml Nml Nml Nml Nml Nml Nml Nml Nml N/A  Left Gastroc Nml Nml Nml Nml Nml Nml Nml Nml Nml Nml Nml Nml N/A  Left RectFemoris Nml Nml Nml Nml Nml Nml Nml Nml Nml Nml Nml Nml N/A  Left GluteusMed Nml Nml Nml Nml Nml Nml Nml Nml Nml Nml Nml Nml N/A  Left 1stDorInt Nml Nml Nml Nml Nml Nml Nml Nml Nml Nml Nml Nml N/A  Left PronatorTeres Nml Nml Nml Nml Nml Nml Nml Nml Nml Nml Nml Nml N/A  Left Triceps Nml Nml Nml Nml Nml Nml Nml Nml Nml Nml Nml Nml N/A  Left Deltoid Nml Nml Nml Nml Nml Nml Nml Nml Nml Nml Nml Nml N/A  Left Biceps Nml Nml Nml Nml Nml Nml Nml Nml Nml Nml Nml Nml N/A      Waveforms:

## 2021-01-05 ENCOUNTER — Telehealth: Payer: Self-pay

## 2021-01-05 NOTE — Telephone Encounter (Signed)
-----   Message from Miranda Berthold, DO sent at 01/05/2021  9:46 AM EST ----- Please inform pt that her nerve testing is normal, no signs of neuropathy.  We can check MRI lumbar spine to see if her feet symptoms are stemming from a pinched nerve in her back. If agreeable, please order MRI lumbar spine wo contrast. Thanks.

## 2021-01-05 NOTE — Telephone Encounter (Signed)
Called patient and informed her of results and recommendations. Patient stated that she would like to discuss the MRI with her PCP before making a decision. Patient will let us know once she decides what she would like to do.

## 2021-01-05 NOTE — Telephone Encounter (Signed)
Patient left message with accessnurse stating that she was returning a missed call from the office.

## 2021-01-05 NOTE — Telephone Encounter (Signed)
Called patient and left a message for a call back.  

## 2021-01-09 NOTE — Telephone Encounter (Signed)
Let pt know that it is MRI Lumbar spine (per Dr. Ena Dawley prior phone call). As far as we can tell, that hasn't been done previously.  Other questions can be addressed following completion of that, once more information is obtained.

## 2021-01-16 ENCOUNTER — Other Ambulatory Visit: Payer: Self-pay | Admitting: Family Medicine

## 2021-01-30 ENCOUNTER — Encounter: Payer: Self-pay | Admitting: Family Medicine

## 2021-01-30 ENCOUNTER — Ambulatory Visit: Payer: Medicare PPO | Admitting: Family Medicine

## 2021-01-30 ENCOUNTER — Other Ambulatory Visit: Payer: Self-pay

## 2021-01-30 VITALS — BP 147/73 | HR 64 | Temp 98.0°F | Ht 63.0 in | Wt 199.0 lb

## 2021-01-30 DIAGNOSIS — Z780 Asymptomatic menopausal state: Secondary | ICD-10-CM

## 2021-01-30 DIAGNOSIS — R2242 Localized swelling, mass and lump, left lower limb: Secondary | ICD-10-CM | POA: Diagnosis not present

## 2021-01-30 DIAGNOSIS — I10 Essential (primary) hypertension: Secondary | ICD-10-CM | POA: Diagnosis not present

## 2021-01-30 DIAGNOSIS — R202 Paresthesia of skin: Secondary | ICD-10-CM | POA: Diagnosis not present

## 2021-01-30 NOTE — Patient Instructions (Signed)
Bone density scan is due.  The order is in.  Call and ask to schedule at your convenience Shingrix ([email or web address]) shot is free of charge with your insurance.  You may have this administered at your convenience  Referral to dermatology placed.

## 2021-01-30 NOTE — Progress Notes (Signed)
Subjective: CC: Follow-up skin lesions, hypertension PCP: [name], DO [id number] Miranda Wilson is a 70 y.o. female presenting to clinic today for:  1.  Skin lesions/ HTN Patient with recent flare of the lesions of the left lower extremity.  This has been thought to be erythema nodosum but she has not yet been able to schedule with her dermatologist for formal biopsy and confirmation.  She does report that symptoms did seem to be improved after use of naproxen.  She worried about the implications on her blood pressure however as she was measuring elevated blood pressures with her home wrist cuff.  She brings that to the office today and apparently there is a 25 point discrepancy in systolic blood pressure.  She is compliant with losartan 25 mg daily.  No reports of chest pain or shortness of breath.  2.  Neuralgia Patient had nerve conduction studies and her specialist is recommending MRI of the lumbar spine for further evaluation.  She is not sure she would want surgery however if an abnormality were found.   ROS: Per HPI  Allergies  Allergen Reactions  . Clindamycin/Lincomycin Other (See Comments)    Dizzy spells  . Codeine Nausea And Vomiting  . Crestor [Rosuvastatin Calcium] Other (See Comments)    Aching flu like large dose   . Lipitor [Atorvastatin Calcium] Other (See Comments)    Numbness in feet   . Pravachol Other (See Comments)    Back pain    . Simvastatin Other (See Comments)    Back pain    . Ultram [Tramadol Hcl] Nausea And Vomiting  . Vioxx [Rofecoxib] Other (See Comments)    Fluid rentention , elevated blood pressure   . Feldene [Piroxicam] Rash  . Myrbetriq [Mirabegron] Other (See Comments)    Constipation, headache  . Penicillins Rash    Pt states she CAN take Keflex  . Toviaz [Fesoterodine Fumarate Er] Other (See Comments)    Dry skin, dry eyes, constipation   Past Medical History:  Diagnosis Date  . At risk for sleep apnea   . Bilateral edema  of lower extremity    left > right --  wear compression hose  . Cervical spondylosis   . Chronic constipation   . Cystitis [id number]   Dr Prince Rome - here   . GERD (gastroesophageal reflux disease)   . History of colon polyps   . History of idiopathic seizure    AGE 63 to 20  X5  --  UNKNOW IDIOLOGY , PER PT WAS ANEMIC AT THE TIME--  NONE SINCE  . History of tachycardia    S/P  RADIOACTIVE IODINE ABLATION OF THYROID  . Hyperlipidemia   . Hypertension   . Hypothyroidism, postradioiodine therapy    AGE 8  . IBS (irritable bowel syndrome)   . Neuropathy   . OA (osteoarthritis)    shoulders  left > right  . PMB (postmenopausal bleeding)   . S/P radioactive iodine thyroid ablation   . Urge urinary incontinence   . Varicose veins   . Vein disorder    LEFT ANKLE VEIN VALVE REFLUX WITH DECREASED CIRCULATION     Current Outpatient Medications:  .  Cholecalciferol (VITAMIN D) 2000 units CAPS, Take by mouth., Disp: , Rfl:  .  levothyroxine (SYNTHROID) 150 MCG tablet, TAKE 1/2 TABLET BY MOUTH EVERY DAY EXCEPT 1 TAB ON MON, FRI., Disp: 54 tablet, Rfl: 3 .  loratadine (CLARITIN) 10 MG tablet, Take 1 tablet (10 mg total) by mouth  daily. (Patient taking differently: Take 10 mg by mouth daily as needed.), Disp: 30 tablet, Rfl: 1 .  losartan (COZAAR) 25 MG tablet, TAKE 1 TABLET (25 MG TOTAL) BY MOUTH DAILY., Disp: 30 tablet, Rfl: 1 .  Multiple Vitamins-Minerals (CENTRUM SILVER PO), Take 1 tablet by mouth daily., Disp: , Rfl:  .  Nabumetone (RELAFEN PO), Take by mouth., Disp: , Rfl:  .  ondansetron (ZOFRAN ODT) 4 MG disintegrating tablet, Take 1 tablet (4 mg total) by mouth every 8 (eight) hours as needed for nausea or vomiting., Disp: 20 tablet, Rfl: 0 .  Propylene Glycol (SYSTANE BALANCE OP), Apply 1 drop to eye 2 (two) times daily as needed (Dry eyes). Reported on 01/31/2016, Disp: , Rfl:  .  rosuvastatin (CRESTOR) 40 MG tablet, Take 1 tablet (40 mg total) by mouth daily. (Patient taking  differently: Take 20 mg by mouth daily. Take half a tablet (20 mg) daily), Disp: 30 tablet, Rfl: 4 .  Triamcinolone Acetonide (TRIAMCINOLONE 0.1 % CREAM : EUCERIN) CREA, Apply 1 application topically 2 (two) times daily., Disp: , Rfl:  .  vitamin B-12 (CYANOCOBALAMIN) 1000 MCG tablet, Take 1,000 mcg by mouth every other day. Take one tablet every other day, Disp: , Rfl:  Social History   Socioeconomic History  . Marital status: Married    Spouse name: Ronalee Belts  . Number of children: 1  . Years of education: 77  . Highest education level: Bachelor's degree (e.g., BA, AB, BS)  Occupational History  . Occupation: Retired     Comment: Pharmacist, hospital  Tobacco Use  . Smoking status: Never Smoker  . Smokeless tobacco: Never Used  Vaping Use  . Vaping Use: Never used  Substance and Sexual Activity  . Alcohol use: No  . Drug use: No  . Sexual activity: Not Currently    Birth control/protection: None    Comment: not much  Other Topics Concern  . Not on file  Social History Narrative   Lives with husband in a one story home. Back end of home has steps leading out to a deck.    Right handed   Drinks a lot of caffiene   Social Determinants of Health   Financial Resource Strain: Not on file  Food Insecurity: Not on file  Transportation Needs: Not on file  Physical Activity: Not on file  Stress: Not on file  Social Connections: Not on file  Intimate Partner Violence: Not on file   Family History  Problem Relation Age of Onset  . COPD Mother        smoked  . Parkinsonism Mother   . Arthritis Mother   . Epilepsy Mother        early in 19's  . Myasthenia gravis Father   . Asthma Father   . Emphysema Father        smoked  . Glaucoma Father   . Arthritis Brother        knees  . Heart disease Brother   . Diabetes Brother   . Hyperlipidemia Brother   . Hyperlipidemia Brother   . Hyperlipidemia Brother   . Heart disease Brother   . Hyperlipidemia Brother   . CAD Paternal Grandmother 21   . Epilepsy Daughter 1  . Diabetes Daughter   . Kidney disease Maternal Uncle   . Colon cancer Neg Hx     Objective: Office vital signs reviewed. BP (!) 147/73 Comment: left arm, office machine  Pulse 64 Comment: office machine, left arm  Temp 98  F (36.7 C) (Temporal)   Ht 5\' 3"  (1.6 m)   Wt 199 lb (90.3 kg)   LMP 04/29/1991   SpO2 100%   BMI 35.25 kg/m   Physical Examination:  General: Awake, alert, well nourished, No acute distress HEENT: Normal; sclera white Cardio: regular rate and rhythm, S1S2 heard, no murmurs appreciated Pulm: clear to auscultation bilaterally, no wheezes, rhonchi or rales; normal work of breathing on room air Extremities: warm, well perfused, No edema, cyanosis or clubbing; +2 pulses bilaterally MSK: Ambulating independently Skin: Mild erythema noted along the left medial lower leg.  There is no increased warmth.  There is some postinflammatory hyperpigmentation and palpable nodule noted.  No skin breakdown  Assessment/ Plan: 70 y.o. female   Essential hypertension, benign  Paresthesia of upper and lower extremities of both sides  Subcutaneous nodule of left lower leg - Plan: Ambulatory referral to Dermatology  Blood pressure well controlled.  Continue low dose losartan 25mg .  Her blood pressure cuff is not dependable and I recommended that she get a brachial cuff instead.  Ongoing paresthesias.  Plan for MRI soon.  We discussed some nonsurgical options of degenerative changes are found in the lumbar spine that may be causing lower extremity symptoms  I placed a formal referral to dermatology in hopes that they can obtain a biopsy of the lesions of her leg and confirm whether or not this is erythema nodosum.  No orders of the defined types were placed in this encounter.  No orders of the defined types were placed in this encounter.    [name], DO [place] [phone]

## 2021-02-15 ENCOUNTER — Other Ambulatory Visit: Payer: Self-pay | Admitting: Family Medicine

## 2021-02-15 DIAGNOSIS — I10 Essential (primary) hypertension: Secondary | ICD-10-CM

## 2021-03-07 DIAGNOSIS — Z961 Presence of intraocular lens: Secondary | ICD-10-CM | POA: Diagnosis not present

## 2021-03-07 DIAGNOSIS — H52223 Regular astigmatism, bilateral: Secondary | ICD-10-CM | POA: Diagnosis not present

## 2021-03-07 DIAGNOSIS — H04123 Dry eye syndrome of bilateral lacrimal glands: Secondary | ICD-10-CM | POA: Diagnosis not present

## 2021-03-07 DIAGNOSIS — H524 Presbyopia: Secondary | ICD-10-CM | POA: Diagnosis not present

## 2021-03-20 ENCOUNTER — Other Ambulatory Visit: Payer: Self-pay | Admitting: Family Medicine

## 2021-03-20 DIAGNOSIS — E7800 Pure hypercholesterolemia, unspecified: Secondary | ICD-10-CM

## 2021-04-10 DIAGNOSIS — D1801 Hemangioma of skin and subcutaneous tissue: Secondary | ICD-10-CM | POA: Diagnosis not present

## 2021-04-10 DIAGNOSIS — M793 Panniculitis, unspecified: Secondary | ICD-10-CM | POA: Diagnosis not present

## 2021-04-10 DIAGNOSIS — L718 Other rosacea: Secondary | ICD-10-CM | POA: Diagnosis not present

## 2021-04-10 DIAGNOSIS — D2371 Other benign neoplasm of skin of right lower limb, including hip: Secondary | ICD-10-CM | POA: Diagnosis not present

## 2021-04-10 DIAGNOSIS — L304 Erythema intertrigo: Secondary | ICD-10-CM | POA: Diagnosis not present

## 2021-04-10 DIAGNOSIS — L661 Lichen planopilaris: Secondary | ICD-10-CM | POA: Diagnosis not present

## 2021-04-13 ENCOUNTER — Ambulatory Visit (INDEPENDENT_AMBULATORY_CARE_PROVIDER_SITE_OTHER): Payer: Medicare PPO

## 2021-04-13 VITALS — BP 125/77 | HR 70 | Temp 97.1°F | Ht 63.0 in | Wt 192.0 lb

## 2021-04-13 DIAGNOSIS — Z0000 Encounter for general adult medical examination without abnormal findings: Secondary | ICD-10-CM | POA: Diagnosis not present

## 2021-04-13 DIAGNOSIS — Z1231 Encounter for screening mammogram for malignant neoplasm of breast: Secondary | ICD-10-CM | POA: Diagnosis not present

## 2021-04-13 NOTE — Progress Notes (Signed)
Subjective:   Miranda Wilson is a 70 y.o. female who presents for Medicare Annual (Subsequent) preventive examination.  Virtual Visit via Telephone Note  I connected with  Miranda Wilson on 04/13/21 at  9:00 AM EDT by telephone and verified that I am speaking with the correct person using two identifiers.  Location: Patient: Home Provider: [facility] Persons participating in the virtual visit: patient/Nurse Health Advisor   I discussed the limitations, risks, security and privacy concerns of performing an evaluation and management service by telephone and the availability of in person appointments. The patient expressed understanding and agreed to proceed.  Interactive audio and video telecommunications were attempted between this nurse and patient, however failed, due to patient having technical difficulties OR patient did not have access to video capability.  We continued and completed visit with audio only.  Some vital signs may be absent or patient reported.   Dhillon Comunale E Aarnav Steagall, LPN   Review of Systems     Cardiac Risk Factors include: advanced age (>33men, >86 women);dyslipidemia;obesity (BMI >30kg/m2);sedentary lifestyle;hypertension     Objective:    Today's Vitals   04/13/21 0916  BP: 125/77  Pulse: 70  Temp: (!) 97.1 F (36.2 C)  Weight: 192 lb (87.1 kg)  Height: 5\' 3"  (1.6 m)  PainSc: 3    Body mass index is 34.01 kg/m.  Advanced Directives 04/13/2021 11/21/2020 04/12/2020 04/08/2019 12/20/2016 10/01/2016 02/27/2016  Does Patient Have a Medical Advance Directive? Yes No No No Yes Yes Yes  Type of Paramedic of [place];Living will - - - [place];Living will [place];Living will [place];Living will  Does patient want to make changes to medical advance directive? - - - - Yes (Inpatient - patient defers changing a medical advance directive at this time) - -  Copy of [place] in Chart? No - copy requested - - - No - copy requested - -  Would patient like information on creating a medical advance directive? - - No - Patient declined No - Patient declined - - -    Current Medications (verified) Outpatient Encounter Medications as of 04/13/2021  Medication Sig   Cholecalciferol (VITAMIN D) 2000 units CAPS Take by mouth.   levothyroxine (SYNTHROID) 150 MCG tablet TAKE 1/2 TABLET BY MOUTH EVERY DAY EXCEPT 1 TAB ON MON, FRI.   losartan (COZAAR) 25 MG tablet TAKE 1 TABLET (25 MG TOTAL) BY MOUTH DAILY.   Multiple Vitamins-Minerals (CENTRUM SILVER PO) Take 1 tablet by mouth daily.   Multiple Vitamins-Minerals (HAIR/SKIN/NAILS/BIOTIN) TABS Take by mouth.   mupirocin ointment (BACTROBAN) 2 % 1 application 2 (two) times daily.   nystatin (MYCOSTATIN) 100000 UNIT/ML suspension Take 5 mLs by mouth 4 (four) times daily.   ondansetron (ZOFRAN ODT) 4 MG disintegrating tablet Take 1 tablet (4 mg total) by mouth every 8 (eight) hours as needed for nausea or vomiting.   Propylene Glycol (SYSTANE BALANCE OP) Apply 1 drop to eye 2 (two) times daily as needed (Dry eyes). Reported on 01/31/2016   rosuvastatin (CRESTOR) 40 MG tablet TAKE 1 TABLET BY MOUTH EVERY DAY   Triamcinolone Acetonide (TRIAMCINOLONE 0.1 % CREAM : EUCERIN) CREA Apply 1 application topically 2 (two) times daily.   vitamin B-12 (CYANOCOBALAMIN) 1000 MCG tablet Take 1,000 mcg by mouth every other day. Take one tablet every other day   Nabumetone (RELAFEN PO) Take by mouth. (Patient not taking: No sig reported)   No facility-administered encounter  medications on file as of 04/13/2021.    Allergies (verified) Clindamycin/lincomycin, Codeine, Crestor [rosuvastatin calcium], Lipitor [atorvastatin calcium], Pravachol, Simvastatin, Ultram [tramadol hcl], Vioxx [rofecoxib], Feldene [piroxicam], Myrbetriq [mirabegron], Penicillins, and Toviaz [fesoterodine fumarate er]   History: Past Medical History:  Diagnosis Date    At risk for sleep apnea    Bilateral edema of lower extremity    left > right --  wear compression hose   Cervical spondylosis    Chronic constipation    Cystitis [id number]'T   Dr Prince Rome - here    GERD (gastroesophageal reflux disease)    History of colon polyps    History of idiopathic seizure    AGE 55 to 65  X5  --  UNKNOW IDIOLOGY , PER PT WAS ANEMIC AT THE TIME--  NONE SINCE   History of tachycardia    S/P  RADIOACTIVE IODINE ABLATION OF THYROID   Hyperlipidemia    Hypertension    Hypothyroidism, postradioiodine therapy    AGE 39   IBS (irritable bowel syndrome)    Neuropathy    OA (osteoarthritis)    shoulders  left > right   PMB (postmenopausal bleeding)    S/P radioactive iodine thyroid ablation    Urge urinary incontinence    Varicose veins    Vein disorder    LEFT ANKLE VEIN VALVE REFLUX WITH DECREASED CIRCULATION    Past Surgical History:  Procedure Laterality Date   CATARACT EXTRACTION W/ INTRAOCULAR LENS  IMPLANT, BILATERAL  right 2011//  left 2013   [place]   CLOSED LEFT KNEE MANIPULATION AND RIGHT KNEE CORTISONE INJECTION  02-20-2010   COLONOSCOPY W/ POLYPECTOMY  10-25-2008   DILATION AND CURETTAGE OF UTERUS     ENDOVENOUS ABLATION SAPHENOUS VEIN W/ LASER  04/2007   EYE SURGERY Bilateral    cataract   EYE SURGERY Bilateral    eyelid lifts /// left eye - placed removed   HYSTEROSCOPY WITH D & C N/A 11/11/2014   Procedure: DILATATION AND CURETTAGE /HYSTEROSCOPY;  Surgeon: Gus Height, MD;  Location: [facility];  Service: Gynecology;  Laterality: N/A;   JOINT REPLACEMENT     LAPAROSCOPIC CHOLECYSTECTOMY  1993   and BILATERAL TUBAL LIGATION   LEFT KNEE ARTHROTOMY W/ LYSIS ADHESIONS  12-01-2010   MOUTH SURGERY     dental implants   TONSILLECTOMY  1955   TOTAL KNEE ARTHROPLASTY Left 12-19-2009   TOTAL KNEE ARTHROPLASTY Right 02/13/2016   Procedure: RIGHT TOTAL KNEE ARTHROPLASTY;  Surgeon: Gaynelle Arabian, MD;  Location: [facility];   Service: Orthopedics;  Laterality: Right;   [place] to 2010   includes laser and phlebectomies   Family History  Problem Relation Age of Onset   COPD Mother        smoked   Parkinsonism Mother    Arthritis Mother    Epilepsy Mother        early in 64's   Myasthenia gravis Father    Asthma Father    Emphysema Father        smoked   Glaucoma Father    Arthritis Brother        knees   Heart disease Brother    Diabetes Brother    Hyperlipidemia Brother    Hyperlipidemia Brother    Hyperlipidemia Brother    Heart disease Brother    Hyperlipidemia Brother    CAD Paternal Grandmother 60   Epilepsy Daughter 26  Diabetes Daughter    Kidney disease Maternal Uncle    Colon cancer Neg Hx    Social History   Socioeconomic History   Marital status: Married    Spouse name: Ronalee Belts   Number of children: 1   Years of education: 16   Highest education level: Bachelor's degree (e.g., BA, AB, BS)  Occupational History   Occupation: Retired     Comment: Pharmacist, hospital  Tobacco Use   Smoking status: Never   Smokeless tobacco: Never  Vaping Use   Vaping Use: Never used  Substance and Sexual Activity   Alcohol use: No   Drug use: No   Sexual activity: Not Currently    Birth control/protection: None    Comment: not much  Other Topics Concern   Not on file  Social History Narrative   Lives with husband in a one story home. Back end of home has steps leading out to a deck. Grandson stays with them 5 days per week while his mother works   Right handed   Drinks a lot of caffiene   Social Determinants of Radio broadcast assistant Strain: Low Risk    Difficulty of Paying Living Expenses: Not hard at all  Food Insecurity: No Food Insecurity   Worried About Charity fundraiser in the Last Year: Never true   Arboriculturist in the Last Year: Never true  Transportation Needs: No Transportation Needs   Lack of Transportation (Medical): No   Lack of  Transportation (Non-Medical): No  Physical Activity: Insufficiently Active   Days of Exercise per Week: 5 days   Minutes of Exercise per Session: 10 min  Stress: Stress Concern Present   Feeling of Stress : To some extent  Social Connections: Engineer, building services of Communication with Friends and Family: More than three times a week   Frequency of Social Gatherings with Friends and Family: More than three times a week   Attends Religious Services: More than 4 times per year   Active Member of [employer] or Organizations: Yes   Attends Music therapist: More than 4 times per year   Marital Status: Married    Tobacco Counseling Counseling given: Not Answered   Clinical Intake:  Pre-visit preparation completed: Yes  Pain : 0-10 Pain Score: 3  Pain Type: Chronic pain Pain Location: Leg Pain Orientation: Right, Left Pain Descriptors / Indicators: Aching, Sore, Discomfort Pain Onset: More than a month ago Pain Frequency: Intermittent     BMI - recorded: 34.01 Nutritional Status: BMI > 30  Obese Nutritional Risks: None Diabetes: No  How often do you need to have someone help you when you read instructions, pamphlets, or other written materials from your doctor or pharmacy?: 1 - Never  Diabetic? No  Interpreter Needed?: No  Information entered by :: Nely Dedmon, LPN   Activities of Daily Living In your present state of health, do you have any difficulty performing the following activities: 04/13/2021  Hearing? N  Vision? N  Difficulty concentrating or making decisions? N  Walking or climbing stairs? Y  Dressing or bathing? N  Doing errands, shopping? N  Preparing Food and eating ? N  Using the Toilet? N  In the past six months, have you accidently leaked urine? Y  Comment she wears depends - Urology couldn't help - she can't tolerate bladder meds  Do you have problems with loss of bowel control? N  Managing your Medications? N  Managing your  Finances? N  Housekeeping or managing your Housekeeping? N  Some recent data might be hidden    Patient Care Team: [name], DO as PCP - General (Family Medicine) Chipper Herb, MD (Inactive) as Referring Physician (Family Medicine) Alda Berthold, DO as Consulting Physician (Neurology) Ulla Gallo, MD as Consulting Physician (Dermatology) Celestia Khat, OD (Optometry)  Indicate any recent Medical Services you may have received from other than [facility] providers in the past year (date may be approximate).     Assessment:   This is a routine wellness examination for [place].  Hearing/Vision screen Hearing Screening - Comments:: Denies hearing difficulties  Vision Screening - Comments:: Wears eyeglasses - up to date with annual eye exams with [facility] in [place]  Dietary issues and exercise activities discussed: Current Exercise Habits: Home exercise routine, Type of exercise: strength training/weights;walking;stretching, Time (Minutes): 15, Frequency (Times/Week): 7, Weekly Exercise (Minutes/Week): 105, Intensity: Mild, Exercise limited by: neurologic condition(s);orthopedic condition(s)   Goals Addressed               This Visit's Progress     Exercise 3x per week (30 min per time)   Not on track     Has trouble due to leg pain and right knee stiffness and pain       Patient Stated (pt-stated)   On track     " I want to work on keeping my mind [place] by doing word puzzles"        Depression Screen PHQ 2/9 Scores 04/13/2021 01/30/2021 12/06/2020 10/25/2020 07/06/2020 05/11/2020 04/14/2020  PHQ - 2 Score 0 0 0 0 0 0 0  PHQ- 9 Score - - - 0 0 - -    Fall Risk Fall Risk  04/13/2021 01/30/2021 12/06/2020 11/21/2020 10/25/2020  Falls in the past year? 1 0 0 0 0  Number falls in past yr: 1 - - 0 -  Injury with Fall? 0 - - 0 -  Risk for fall due to : History of fall(s);Impaired balance/gait;Impaired vision;Orthopedic patient - - - -  Follow up Education provided;Falls  prevention discussed - - - -    FALL RISK PREVENTION PERTAINING TO THE HOME:  Any stairs in or around the home? Yes  If so, are there any without handrails? No  Home free of loose throw rugs in walkways, pet beds, electrical cords, etc? Yes  Adequate lighting in your home to reduce risk of falls? Yes   ASSISTIVE DEVICES UTILIZED TO PREVENT FALLS:  Life alert? No  Use of a cane, walker or w/c? Yes  Grab bars in the bathroom? No  Shower chair or bench in shower? Yes  Elevated toilet seat or a handicapped toilet? Yes   TIMED UP AND GO:  Was the test performed? No . Telephonic visit.  Cognitive Function: MMSE - Mini Mental State Exam 01/04/2020 09/30/2018 12/20/2016  Orientation to time 5 5 5   Orientation to Place 5 5 5   Registration 3 3 3   Attention/ Calculation 5 5 5   Recall 3 3 3   Language- name 2 objects 2 2 2   Language- repeat 1 1 1   Language- follow 3 step command 3 3 3   Language- read & follow direction 1 1 1   Write a sentence 1 1 1   Copy design 1 1 1   Total score 30 30 30      6CIT Screen 04/12/2020 04/08/2019  What Year? 0 points 0 points  What month? 0 points 0 points  What time? 0 points  0 points  Count back from 20 0 points 0 points  Months in reverse 0 points 0 points  Repeat phrase 0 points 0 points  Total Score 0 0    Immunizations Immunization History  Administered Date(s) Administered   Influenza, High Dose Seasonal PF 07/18/2016, 08/15/2017, 09/21/2018   Influenza,inj,Quad PF,6+ Mos 08/18/2015   Influenza-Unspecified 08/21/2019   PFIZER(Purple Top)SARS-COV-2 Vaccination 03/19/2020, 04/09/2020, 10/15/2020   Pneumococcal Conjugate-13 02/03/2015   Pneumococcal Polysaccharide-23 11/23/2016   Td 11/12/2011   Tdap 11/12/2011    TDAP status: Up to date  Flu Vaccine status: Up to date  Pneumococcal vaccine status: Up to date  Covid-19 vaccine status: Completed vaccines  Qualifies for Shingles Vaccine? Yes   Zostavax completed No   Shingrix  Completed?: No.    Education has been provided regarding the importance of this vaccine. Patient has been advised to call insurance company to determine out of pocket expense if they have not yet received this vaccine. Advised may also receive vaccine at local pharmacy or Health Dept. Verbalized acceptance and understanding.  Screening Tests Health Maintenance  Topic Date Due   Hepatitis C Screening  Never done   Zoster Vaccines- Shingrix (1 of 2) Never done   COVID-19 Vaccine (4 - Booster for Pfizer series) 02/13/2021   MAMMOGRAM  03/23/2021   INFLUENZA VACCINE  05/29/2021   TETANUS/TDAP  11/11/2021   DEXA SCAN  10/01/2023   COLONOSCOPY (Pts 45-90yrs Insurance coverage will need to be confirmed)  10/11/2026   PNA vac Low Risk Adult  Completed   HPV VACCINES  Aged Out    Health Maintenance  Health Maintenance Due  Topic Date Due   Hepatitis C Screening  Never done   Zoster Vaccines- Shingrix (1 of 2) Never done   COVID-19 Vaccine (4 - Booster for Pfizer series) 02/13/2021   MAMMOGRAM  03/23/2021    Colorectal cancer screening: Type of screening: Colonoscopy. Completed 10/11/2016. Repeat every 10 years  Mammogram status: Ordered 04/13/21. Pt provided with contact info and advised to call to schedule appt.   Bone Density status: Completed 09/30/2018. Results reflect: Bone density results: NORMAL. Repeat every 5 years.  Lung Cancer Screening: (Low Dose CT Chest recommended if Age 49-80 years, 30 pack-year currently smoking OR have quit w/in 15years.) does not qualify.   Additional Screening:  Hepatitis C Screening: does qualify; Needs this drawn  Vision Screening: Recommended annual ophthalmology exams for early detection of glaucoma and other disorders of the eye. Is the patient up to date with their annual eye exam?  Yes  Who is the provider or what is the name of the office in which the patient attends annual eye exams? [facility] in [place] If pt is not established with a  provider, would they like to be referred to a provider to establish care? No .   Dental Screening: Recommended annual dental exams for proper oral hygiene  Community Resource Referral / Chronic Care Management: CRR required this visit?  No   CCM required this visit?  No      Plan:     I have personally reviewed and noted the following in the patient's chart:   Medical and social history Use of alcohol, tobacco or illicit drugs  Current medications and supplements including opioid prescriptions.  Functional ability and status Nutritional status Physical activity Advanced directives List of other physicians Hospitalizations, surgeries, and ER visits in previous 12 months Vitals Screenings to include cognitive, depression, and falls Referrals and appointments  In addition,  I have reviewed and discussed with patient certain preventive protocols, quality metrics, and best practice recommendations. A written personalized care plan for preventive services as well as general preventive health recommendations were provided to patient.     Sandrea Hammond, LPN   06/08/8866   Nurse Notes: None

## 2021-04-13 NOTE — Patient Instructions (Signed)
[name] , Thank you for taking time to come for your Medicare Wellness Visit. I appreciate your ongoing commitment to your health goals. Please review the following plan we discussed and let me know if I can assist you in the future.   Screening recommendations/referrals: Colonoscopy: Done 10/11/2016 0 Repeat in 10 years Mammogram: Done 03/23/2020 - Repeat annually (ordered today) Bone Density: Done 09/30/2018 - Repeat every 2 years Recommended yearly ophthalmology/optometry visit for glaucoma screening and checkup Recommended yearly dental visit for hygiene and checkup  Vaccinations: Influenza vaccine: Done 2021 - Repeat every fall Pneumococcal vaccine: Done 02/03/2015 & 11/23/2016 Tdap vaccine: Done 11/12/2011 - Repeat in 10 years Shingles vaccine: Due (2 doses 2-6 months apart)   Covid-19:Done 03/19/20, 04/09/20, & 10/15/20 - due for second booster  Advanced directives: Please bring a copy of your health care power of attorney and living will to the office to be added to your chart at your convenience.  Conditions/risks identified: Continue seated exercises - aim for 30 minutes each day and walking as much as possible. Practice fall prevention.  Next appointment: Follow up in one year for your annual wellness visit    Preventive Care 65 Years and Older, Female Preventive care refers to lifestyle choices and visits with your health care provider that can promote health and wellness. What does preventive care include? A yearly physical exam. This is also called an annual well check. Dental exams once or twice a year. Routine eye exams. Ask your health care provider how often you should have your eyes checked. Personal lifestyle choices, including: Daily care of your teeth and gums. Regular physical activity. Eating a healthy diet. Avoiding tobacco and drug use. Limiting alcohol use. Practicing safe sex. Taking low-dose aspirin every day. Taking vitamin and mineral supplements as  recommended by your health care provider. What happens during an annual well check? The services and screenings done by your health care provider during your annual well check will depend on your age, overall health, lifestyle risk factors, and family history of disease. Counseling  Your health care provider may ask you questions about your: Alcohol use. Tobacco use. Drug use. Emotional well-being. Home and relationship well-being. Sexual activity. Eating habits. History of falls. Memory and ability to understand (cognition). Work and work Statistician. Reproductive health. Screening  You may have the following tests or measurements: Height, weight, and BMI. Blood pressure. Lipid and cholesterol levels. These may be checked every 5 years, or more frequently if you are over 76 years old. Skin check. Lung cancer screening. You may have this screening every year starting at age 34 if you have a 30-pack-year history of smoking and currently smoke or have quit within the past 15 years. Fecal occult blood test (FOBT) of the stool. You may have this test every year starting at age 53. Flexible sigmoidoscopy or colonoscopy. You may have a sigmoidoscopy every 5 years or a colonoscopy every 10 years starting at age 69. Hepatitis C blood test. Hepatitis B blood test. Sexually transmitted disease (STD) testing. Diabetes screening. This is done by checking your blood sugar (glucose) after you have not eaten for a while (fasting). You may have this done every 1-3 years. Bone density scan. This is done to screen for osteoporosis. You may have this done starting at age 68. Mammogram. This may be done every 1-2 years. Talk to your health care provider about how often you should have regular mammograms. Talk with your health care provider about your test results, treatment  options, and if necessary, the need for more tests. Vaccines  Your health care provider may recommend certain vaccines, such  as: Influenza vaccine. This is recommended every year. Tetanus, diphtheria, and acellular pertussis (Tdap, Td) vaccine. You may need a Td booster every 10 years. Zoster vaccine. You may need this after age 89. Pneumococcal 13-valent conjugate (PCV13) vaccine. One dose is recommended after age 74. Pneumococcal polysaccharide (PPSV23) vaccine. One dose is recommended after age 50. Talk to your health care provider about which screenings and vaccines you need and how often you need them. This information is not intended to replace advice given to you by your health care provider. Make sure you discuss any questions you have with your health care provider. Document Released: 11/11/2015 Document Revised: 07/04/2016 Document Reviewed: 08/16/2015 Elsevier Interactive Patient Education  2017 [place] Prevention in the Home Falls can cause injuries. They can happen to people of all ages. There are many things you can do to make your home safe and to help prevent falls. What can I do on the outside of my home? Regularly fix the edges of walkways and driveways and fix any cracks. Remove anything that might make you trip as you walk through a door, such as a raised step or threshold. Trim any bushes or trees on the path to your home. Use bright outdoor lighting. Clear any walking paths of anything that might make someone trip, such as rocks or tools. Regularly check to see if handrails are loose or broken. Make sure that both sides of any steps have handrails. Any raised decks and porches should have guardrails on the edges. Have any leaves, snow, or ice cleared regularly. Use sand or salt on walking paths during winter. Clean up any spills in your garage right away. This includes oil or grease spills. What can I do in the bathroom? Use night lights. Install grab bars by the toilet and in the tub and shower. Do not use towel bars as grab bars. Use non-skid mats or decals in the tub or  shower. If you need to sit down in the shower, use a plastic, non-slip stool. Keep the floor dry. Clean up any water that spills on the floor as soon as it happens. Remove soap buildup in the tub or shower regularly. Attach bath mats securely with double-sided non-slip rug tape. Do not have throw rugs and other things on the floor that can make you trip. What can I do in the bedroom? Use night lights. Make sure that you have a light by your bed that is easy to reach. Do not use any sheets or blankets that are too big for your bed. They should not hang down onto the floor. Have a firm chair that has side arms. You can use this for support while you get dressed. Do not have throw rugs and other things on the floor that can make you trip. What can I do in the kitchen? Clean up any spills right away. Avoid walking on wet floors. Keep items that you use a lot in easy-to-reach places. If you need to reach something above you, use a strong step stool that has a grab bar. Keep electrical cords out of the way. Do not use floor polish or wax that makes floors slippery. If you must use wax, use non-skid floor wax. Do not have throw rugs and other things on the floor that can make you trip. What can I do with my stairs? Do not  leave any items on the stairs. Make sure that there are handrails on both sides of the stairs and use them. Fix handrails that are broken or loose. Make sure that handrails are as long as the stairways. Check any carpeting to make sure that it is firmly attached to the stairs. Fix any carpet that is loose or worn. Avoid having throw rugs at the top or bottom of the stairs. If you do have throw rugs, attach them to the floor with carpet tape. Make sure that you have a light switch at the top of the stairs and the bottom of the stairs. If you do not have them, ask someone to add them for you. What else can I do to help prevent falls? Wear shoes that: Do not have high heels. Have  rubber bottoms. Are comfortable and fit you well. Are closed at the toe. Do not wear sandals. If you use a stepladder: Make sure that it is fully opened. Do not climb a closed stepladder. Make sure that both sides of the stepladder are locked into place. Ask someone to hold it for you, if possible. Clearly mark and make sure that you can see: Any grab bars or handrails. First and last steps. Where the edge of each step is. Use tools that help you move around (mobility aids) if they are needed. These include: Canes. Walkers. Scooters. Crutches. Turn on the lights when you go into a dark area. Replace any light bulbs as soon as they burn out. Set up your furniture so you have a clear path. Avoid moving your furniture around. If any of your floors are uneven, fix them. If there are any pets around you, be aware of where they are. Review your medicines with your doctor. Some medicines can make you feel dizzy. This can increase your chance of falling. Ask your doctor what other things that you can do to help prevent falls. This information is not intended to replace advice given to you by your health care provider. Make sure you discuss any questions you have with your health care provider. Document Released: 08/11/2009 Document Revised: 03/22/2016 Document Reviewed: 11/19/2014 Elsevier Interactive Patient Education  2017 [employer].

## 2021-05-08 ENCOUNTER — Encounter: Payer: Self-pay | Admitting: Family Medicine

## 2021-06-16 DIAGNOSIS — L661 Lichen planopilaris: Secondary | ICD-10-CM | POA: Diagnosis not present

## 2021-06-16 DIAGNOSIS — M793 Panniculitis, unspecified: Secondary | ICD-10-CM | POA: Diagnosis not present

## 2021-06-16 DIAGNOSIS — L718 Other rosacea: Secondary | ICD-10-CM | POA: Diagnosis not present

## 2021-06-26 ENCOUNTER — Other Ambulatory Visit: Payer: Self-pay | Admitting: Family Medicine

## 2021-06-26 DIAGNOSIS — Z1231 Encounter for screening mammogram for malignant neoplasm of breast: Secondary | ICD-10-CM

## 2021-06-28 ENCOUNTER — Other Ambulatory Visit: Payer: Self-pay

## 2021-06-28 ENCOUNTER — Ambulatory Visit
Admission: RE | Admit: 2021-06-28 | Discharge: 2021-06-28 | Disposition: A | Discharge status: Home / Self Care | Payer: Medicare PPO | Source: Ambulatory Visit | Attending: Family Medicine | Admitting: Family Medicine

## 2021-06-28 DIAGNOSIS — Z1231 Encounter for screening mammogram for malignant neoplasm of breast: Secondary | ICD-10-CM

## 2021-08-01 ENCOUNTER — Encounter: Payer: Self-pay | Admitting: Family Medicine

## 2021-08-01 ENCOUNTER — Other Ambulatory Visit: Payer: Medicare PPO

## 2021-08-01 ENCOUNTER — Ambulatory Visit: Payer: Medicare PPO | Admitting: Family Medicine

## 2021-08-01 ENCOUNTER — Other Ambulatory Visit: Payer: Self-pay

## 2021-08-01 VITALS — BP 137/72 | HR 67 | Temp 97.9°F | Ht 63.0 in | Wt 198.2 lb

## 2021-08-01 DIAGNOSIS — M793 Panniculitis, unspecified: Secondary | ICD-10-CM | POA: Insufficient documentation

## 2021-08-01 DIAGNOSIS — I8312 Varicose veins of left lower extremity with inflammation: Secondary | ICD-10-CM | POA: Diagnosis not present

## 2021-08-01 DIAGNOSIS — I10 Essential (primary) hypertension: Secondary | ICD-10-CM | POA: Diagnosis not present

## 2021-08-01 DIAGNOSIS — I8311 Varicose veins of right lower extremity with inflammation: Secondary | ICD-10-CM

## 2021-08-01 DIAGNOSIS — Z23 Encounter for immunization: Secondary | ICD-10-CM | POA: Diagnosis not present

## 2021-08-01 DIAGNOSIS — E7800 Pure hypercholesterolemia, unspecified: Secondary | ICD-10-CM

## 2021-08-01 DIAGNOSIS — E890 Postprocedural hypothyroidism: Secondary | ICD-10-CM

## 2021-08-01 DIAGNOSIS — E559 Vitamin D deficiency, unspecified: Secondary | ICD-10-CM | POA: Diagnosis not present

## 2021-08-01 DIAGNOSIS — E538 Deficiency of other specified B group vitamins: Secondary | ICD-10-CM

## 2021-08-01 MED ORDER — LOSARTAN POTASSIUM 25 MG PO TABS: 25.0000 mg | ORAL_TABLET | Freq: Every day | ORAL | 3 refills | Status: DC

## 2021-08-01 NOTE — Progress Notes (Signed)
Subjective: DS:Miranda Wilson PCP: [name], DO TDH:RCBULAG Miranda Wilson is a 70 y.o. female presenting to clinic today for:  1. Hypothyroidism/hypertension Patient is compliant with Synthroid 150 mcg.  She does not report any change in voice, difficulty swallowing or tremor.  She has had some increased anxiety surrounding the health of her husband.  She is noted some fluctuations in her blood pressure as well.  She brings a detailed record of this today.  She is had a couple of outliers where systolic blood pressures have been 152 and 161 respectively.  The remainder of her systolic blood pressures have remained within the 120s to 130s range.  2.  Skin lesions Patient was told by dermatology that she in fact did not have erythema nodosum and therefore no biopsy was obtained.  She was diagnosed with a lipomodermatosclerosis and told that compression hose and topical compound were to be continued.  She had 3 corticosteroid shots in the lower extremities and this did not help.  In fact she feels like it because some discoloration in the lower extremity.  ROS: Per HPI  Allergies  Allergen Reactions   Clindamycin/Lincomycin Other (See Comments)    Dizzy spells   Codeine Nausea And Vomiting   Crestor [Rosuvastatin Calcium] Other (See Comments)    Aching flu like large dose    Lipitor [Atorvastatin Calcium] Other (See Comments)    Numbness in feet    Pravachol Other (See Comments)    Back pain     Simvastatin Other (See Comments)    Back pain     Ultram [Tramadol Hcl] Nausea And Vomiting   Vioxx [Rofecoxib] Other (See Comments)    Fluid rentention , elevated blood pressure    Feldene [Piroxicam] Rash   Myrbetriq [Mirabegron] Other (See Comments)    Constipation, headache   Penicillins Rash    Pt states she CAN take Keflex   Toviaz [Fesoterodine Fumarate Er] Other (See Comments)    Dry skin, dry eyes, constipation   Past Medical History:  Diagnosis Date   At risk for sleep  apnea    Bilateral edema of lower extremity    left > right --  wear compression hose   Cervical spondylosis    Chronic constipation    Cystitis [id number]'[id number]   Dr Prince Rome - here    GERD (gastroesophageal reflux disease)    History of colon polyps    History of idiopathic seizure    AGE 83 to 20  X5  --  UNKNOW IDIOLOGY , PER PT WAS ANEMIC AT THE TIME--  NONE SINCE   History of tachycardia    S/P  RADIOACTIVE IODINE ABLATION OF THYROID   Hyperlipidemia    Hypertension    Hypothyroidism, postradioiodine therapy    AGE 7   IBS (irritable bowel syndrome)    Neuropathy    OA (osteoarthritis)    shoulders  left > right   PMB (postmenopausal bleeding)    S/P radioactive iodine thyroid ablation    Urge urinary incontinence    Varicose veins    Vein disorder    LEFT ANKLE VEIN VALVE REFLUX WITH DECREASED CIRCULATION     Current Outpatient Medications:    Cholecalciferol (VITAMIN D) 2000 units CAPS, Take by mouth., Disp: , Rfl:    levothyroxine (SYNTHROID) 150 MCG tablet, TAKE 1/2 TABLET BY MOUTH EVERY DAY EXCEPT 1 TAB ON MON, FRI., Disp: 54 tablet, Rfl: 3   losartan (COZAAR) 25 MG tablet, TAKE 1 TABLET (25 MG TOTAL) BY  MOUTH DAILY., Disp: 30 tablet, Rfl: 5   Multiple Vitamins-Minerals (CENTRUM SILVER PO), Take 1 tablet by mouth daily., Disp: , Rfl:    Multiple Vitamins-Minerals (HAIR/SKIN/NAILS/BIOTIN) TABS, Take by mouth., Disp: , Rfl:    ondansetron (ZOFRAN ODT) 4 MG disintegrating tablet, Take 1 tablet (4 mg total) by mouth every 8 (eight) hours as needed for nausea or vomiting., Disp: 20 tablet, Rfl: 0   Propylene Glycol (SYSTANE BALANCE OP), Apply 1 drop to eye 2 (two) times daily as needed (Dry eyes). Reported on 01/31/2016, Disp: , Rfl:    rosuvastatin (CRESTOR) 40 MG tablet, TAKE 1 TABLET BY MOUTH EVERY DAY, Disp: 90 tablet, Rfl: 3   Triamcinolone Acetonide (TRIAMCINOLONE 0.1 % CREAM : EUCERIN) CREA, Apply 1 application topically 2 (two) times daily., Disp: , Rfl:    vitamin B-12  (CYANOCOBALAMIN) 1000 MCG tablet, Take 1,000 mcg by mouth every other day. Take one tablet every other day, Disp: , Rfl:  Social History   Socioeconomic History   Marital status: Married    Spouse name: Ronalee Belts   Number of children: 1   Years of education: 16   Highest education level: Bachelor's degree (e.g., BA, AB, BS)  Occupational History   Occupation: Retired     Comment: Pharmacist, hospital  Tobacco Use   Smoking status: Never   Smokeless tobacco: Never  Vaping Use   Vaping Use: Never used  Substance and Sexual Activity   Alcohol use: No   Drug use: No   Sexual activity: Not Currently    Birth control/protection: None    Comment: not much  Other Topics Concern   Not on file  Social History Narrative   Lives with husband in a one story home. Back end of home has steps leading out to a deck. Grandson stays with them 5 days per week while his mother works   Right handed   Drinks a lot of caffiene   Social Determinants of Radio broadcast assistant Strain: Low Risk    Difficulty of Paying Living Expenses: Not hard at all  Food Insecurity: No Food Insecurity   Worried About Charity fundraiser in the Last Year: Never true   Arboriculturist in the Last Year: Never true  Transportation Needs: No Transportation Needs   Lack of Transportation (Medical): No   Lack of Transportation (Non-Medical): No  Physical Activity: Insufficiently Active   Days of Exercise per Week: 5 days   Minutes of Exercise per Session: 10 min  Stress: Stress Concern Present   Feeling of Stress : To some extent  Social Connections: Engineer, building services of Communication with Friends and Family: More than three times a week   Frequency of Social Gatherings with Friends and Family: More than three times a week   Attends Religious Services: More than 4 times per year   Active Member of [employer] or Organizations: Yes   Attends Music therapist: More than 4 times per year   Marital Status:  Married  Human resources officer Violence: Not At Risk   Fear of Current or Ex-Partner: No   Emotionally Abused: No   Physically Abused: No   Sexually Abused: No   Family History  Problem Relation Age of Onset   COPD Mother        smoked   Parkinsonism Mother    Arthritis Mother    Epilepsy Mother        early in 20's   Myasthenia gravis Father  Asthma Father    Emphysema Father        smoked   Glaucoma Father    Arthritis Brother        knees   Heart disease Brother    Diabetes Brother    Hyperlipidemia Brother    Hyperlipidemia Brother    Hyperlipidemia Brother    Heart disease Brother    Hyperlipidemia Brother    CAD Paternal Grandmother 84   Epilepsy Daughter 60   Diabetes Daughter    Kidney disease Maternal Uncle    Colon cancer Neg Hx     Objective: Office vital signs reviewed. BP 137/72   Pulse 67   Temp 97.9 F (36.6 C)   Ht _0  (1.6 m)   Wt 198 lb 3.2 oz (89.9 kg)   LMP 04/29/1991   SpO2 100%   BMI 35.11 kg/m   Physical Examination:  General: Awake, alert, well nourished, No acute distress HEENT: Normal; no exophthalmos.  No goiter Cardio: regular rate and rhythm, S1S2 heard, no murmurs appreciated Pulm: clear to auscultation bilaterally, no wheezes, rhonchi or rales; normal work of breathing on room air Skin: Continues to have nodular lesions noted in the right lower extremity.  She has some hyperpigmentation traversing the anterior shin on the right.  Assessment/ Plan: 70 y.o. female   Postablative hypothyroidism - Plan: T4, Free, TSH, CANCELED: TSH, CANCELED: T4, Free  Vitamin B 12 deficiency - Plan: Vitamin B12, CANCELED: Vitamin B12  Vitamin D deficiency - Plan: VITAMIN D 25 Hydroxy (Vit-D Deficiency, Fractures), CANCELED: VITAMIN D 25 Hydroxy (Vit-D Deficiency, Fractures)  Essential hypertension, benign - Plan: CMP14+EGFR, losartan (COZAAR) 25 MG tablet, CANCELED: CMP14+EGFR  Pure hypercholesterolemia - Plan: Lipid panel, CANCELED: LDL  Cholesterol, Direct  Lipodermatosclerosis of both lower extremities  Need for immunization against influenza - Plan: Flu Vaccine QUAD High Dose(Fluad)  Asymptomatic from a thyroid standpoint.  She will return for thyroid labs that she has been taking her statin nail vitamins prior to today's appointment  I think that the lability of blood pressure is likely secondary to stress.  Overall her blood pressures are well controlled and therefore no medication adjustment has been made.  Losartan renewed  We will plan to check her vitamin B12, vitamin D and fasting lipid panel in 2 weeks.  I have updated her record to include new diagnosis by dermatology.  Symptoms unfortunately are not under excellent control at this time despite interventions  Influenza vaccination administered  No orders of the defined types were placed in this encounter.  No orders of the defined types were placed in this encounter.    [name], DO [place] [phone]

## 2021-08-01 NOTE — Patient Instructions (Addendum)
All COVID shots are accounted for.  Return in 2 weeks for labs.    Please remember any biotin containing products should be held for 2 weeks prior to thyroid lab tests.  If you will fast, I will check your cholesterol as well.

## 2021-08-10 ENCOUNTER — Encounter: Payer: Self-pay | Admitting: Family Medicine

## 2021-08-15 ENCOUNTER — Ambulatory Visit (INDEPENDENT_AMBULATORY_CARE_PROVIDER_SITE_OTHER): Payer: Medicare PPO

## 2021-08-15 ENCOUNTER — Other Ambulatory Visit: Payer: Medicare PPO

## 2021-08-15 ENCOUNTER — Other Ambulatory Visit: Payer: Self-pay

## 2021-08-15 DIAGNOSIS — E890 Postprocedural hypothyroidism: Secondary | ICD-10-CM | POA: Diagnosis not present

## 2021-08-15 DIAGNOSIS — E7800 Pure hypercholesterolemia, unspecified: Secondary | ICD-10-CM | POA: Diagnosis not present

## 2021-08-15 DIAGNOSIS — E538 Deficiency of other specified B group vitamins: Secondary | ICD-10-CM | POA: Diagnosis not present

## 2021-08-15 DIAGNOSIS — Z780 Asymptomatic menopausal state: Secondary | ICD-10-CM

## 2021-08-15 DIAGNOSIS — E559 Vitamin D deficiency, unspecified: Secondary | ICD-10-CM | POA: Diagnosis not present

## 2021-08-15 DIAGNOSIS — I10 Essential (primary) hypertension: Secondary | ICD-10-CM

## 2021-08-16 ENCOUNTER — Other Ambulatory Visit: Payer: Self-pay | Admitting: *Deleted

## 2021-08-16 DIAGNOSIS — E890 Postprocedural hypothyroidism: Secondary | ICD-10-CM

## 2021-08-16 DIAGNOSIS — Z780 Asymptomatic menopausal state: Secondary | ICD-10-CM | POA: Diagnosis not present

## 2021-08-16 LAB — VITAMIN D 25 HYDROXY (VIT D DEFICIENCY, FRACTURES): Vit D, 25-Hydroxy: 58.5 ng/mL (ref 30.0–100.0)

## 2021-08-16 LAB — CMP14+EGFR
ALT: 13 IU/L (ref 0–32)
AST: 25 IU/L (ref 0–40)
Albumin/Globulin Ratio: 2.1 (ref 1.2–2.2)
Albumin: 4.2 g/dL (ref 3.7–4.7)
Alkaline Phosphatase: 115 IU/L (ref 44–121)
BUN/Creatinine Ratio: 12 (ref 12–28)
BUN: 12 mg/dL (ref 8–27)
Bilirubin Total: 0.5 mg/dL (ref 0.0–1.2)
CO2: 23 mmol/L (ref 20–29)
Calcium: 9.1 mg/dL (ref 8.7–10.3)
Chloride: 105 mmol/L (ref 96–106)
Creatinine, Ser: 0.99 mg/dL (ref 0.57–1.00)
Globulin, Total: 2 g/dL (ref 1.5–4.5)
Glucose: 85 mg/dL (ref 70–99)
Potassium: 4.4 mmol/L (ref 3.5–5.2)
Sodium: 140 mmol/L (ref 134–144)
Total Protein: 6.2 g/dL (ref 6.0–8.5)
eGFR: 61 mL/min/{1.73_m2} (ref >59)

## 2021-08-16 LAB — VITAMIN B12: Vitamin B-12: 899 pg/mL (ref 232–1245)

## 2021-08-16 LAB — LIPID PANEL
Chol/HDL Ratio: 2.7 ratio (ref 0.0–4.4)
Cholesterol, Total: 171 mg/dL (ref 100–199)
HDL: 64 mg/dL (ref >39)
LDL Chol Calc (NIH): 91 mg/dL (ref 0–99)
Triglycerides: 85 mg/dL (ref 0–149)
VLDL Cholesterol Cal: 16 mg/dL (ref 5–40)

## 2021-08-16 LAB — T4, FREE: Free T4: 1.47 ng/dL (ref 0.82–1.77)

## 2021-08-16 LAB — TSH: TSH: 0.444 u[IU]/mL — ABNORMAL LOW (ref 0.450–4.500)

## 2021-11-10 ENCOUNTER — Other Ambulatory Visit: Payer: Medicare PPO

## 2021-11-10 DIAGNOSIS — E890 Postprocedural hypothyroidism: Secondary | ICD-10-CM | POA: Diagnosis not present

## 2021-11-11 LAB — THYROID PANEL WITH TSH
Free Thyroxine Index: 2 (ref 1.2–4.9)
T3 Uptake Ratio: 22 % — ABNORMAL LOW (ref 24–39)
T4, Total: 9.2 ug/dL (ref 4.5–12.0)
TSH: 0.969 u[IU]/mL (ref 0.450–4.500)

## 2021-11-27 ENCOUNTER — Encounter: Payer: Self-pay | Admitting: Family Medicine

## 2021-11-27 ENCOUNTER — Ambulatory Visit (INDEPENDENT_AMBULATORY_CARE_PROVIDER_SITE_OTHER): Payer: Medicare PPO

## 2021-11-27 ENCOUNTER — Ambulatory Visit: Payer: Medicare PPO | Admitting: Family Medicine

## 2021-11-27 VITALS — BP 137/66 | HR 67 | Temp 97.6°F | Ht 63.0 in | Wt 196.4 lb

## 2021-11-27 DIAGNOSIS — K2100 Gastro-esophageal reflux disease with esophagitis, without bleeding: Secondary | ICD-10-CM

## 2021-11-27 DIAGNOSIS — M722 Plantar fascial fibromatosis: Secondary | ICD-10-CM | POA: Diagnosis not present

## 2021-11-27 DIAGNOSIS — M79671 Pain in right foot: Secondary | ICD-10-CM | POA: Diagnosis not present

## 2021-11-27 DIAGNOSIS — M7731 Calcaneal spur, right foot: Secondary | ICD-10-CM | POA: Diagnosis not present

## 2021-11-27 MED ORDER — PANTOPRAZOLE SODIUM 40 MG PO TBEC: 40.0000 mg | DELAYED_RELEASE_TABLET | Freq: Every day | ORAL | 1 refills | Status: DC

## 2021-11-27 NOTE — Progress Notes (Signed)
Subjective: CC: Heel pain, coughing PCP: [name], DO Miranda Wilson is a 71 y.o. female presenting to clinic today for:  1.  Heel pain Patient reports that she has been experiencing right-sided heel pain for quite some time now.  She points to the ball of her heel as the area of pain.  She admits that it hurts more when she goes to stand up.  No preceding injury but she wonders if she might have a heel spur and is asking for an x-ray of that today.  2.  GERD Patient reports that some days she coughs so bad that she chokes.  Coughing seems to be worse after meals.  She had an event where she bent over and threw up and it came out through her nose.  This has gotten better but she has noticed that symptoms seem to be worse with eating spicy or crunchy foods.  She has subsequently been avoiding these types of foods over the last couple weeks and symptoms are getting slightly better.  She thinks that she may need referral to gastroenterology for some swallowing issues.  No blood in stool.  No hematemesis reported.  Not currently on any acid reducing medications.   ROS: Per HPI  Allergies  Allergen Reactions   Clindamycin/Lincomycin Other (See Comments)    Dizzy spells   Codeine Nausea And Vomiting   Crestor [Rosuvastatin Calcium] Other (See Comments)    Aching flu like large dose    Lipitor [Atorvastatin Calcium] Other (See Comments)    Numbness in feet    Pravachol Other (See Comments)    Back pain     Simvastatin Other (See Comments)    Back pain     Ultram [Tramadol Hcl] Nausea And Vomiting   Vioxx [Rofecoxib] Other (See Comments)    Fluid rentention , elevated blood pressure    Feldene [Piroxicam] Rash   Myrbetriq [Mirabegron] Other (See Comments)    Constipation, headache   Penicillins Rash    Pt states she CAN take Keflex   Toviaz [Fesoterodine Fumarate Er] Other (See Comments)    Dry skin, dry eyes, constipation   Past Medical History:  Diagnosis Date    At risk for sleep apnea    Bilateral edema of lower extremity    left > right --  wear compression hose   Cervical spondylosis    Chronic constipation    Cystitis [id number]'I   Dr Prince Rome - here    GERD (gastroesophageal reflux disease)    History of colon polyps    History of idiopathic seizure    AGE 64 to 20  X5  --  UNKNOW IDIOLOGY , PER PT WAS ANEMIC AT THE TIME--  NONE SINCE   History of tachycardia    S/P  RADIOACTIVE IODINE ABLATION OF THYROID   Hyperlipidemia    Hypertension    Hypothyroidism, postradioiodine therapy    AGE 31   IBS (irritable bowel syndrome)    Neuropathy    OA (osteoarthritis)    shoulders  left > right   PMB (postmenopausal bleeding)    S/P radioactive iodine thyroid ablation    Urge urinary incontinence    Varicose veins    Vein disorder    LEFT ANKLE VEIN VALVE REFLUX WITH DECREASED CIRCULATION     Current Outpatient Medications:    Cholecalciferol (VITAMIN D) 2000 units CAPS, Take by mouth., Disp: , Rfl:    levothyroxine (SYNTHROID) 150 MCG tablet, TAKE 1/2 TABLET BY MOUTH EVERY  DAY EXCEPT 1 TAB ON MON, FRI., Disp: 54 tablet, Rfl: 3   losartan (COZAAR) 25 MG tablet, Take 1 tablet (25 mg total) by mouth daily., Disp: 90 tablet, Rfl: 3   Multiple Vitamins-Minerals (CENTRUM SILVER PO), Take 1 tablet by mouth daily., Disp: , Rfl:    Multiple Vitamins-Minerals (HAIR/SKIN/NAILS/BIOTIN) TABS, Take by mouth., Disp: , Rfl:    nabumetone (RELAFEN) 500 MG tablet, Take 500 mg by mouth daily., Disp: , Rfl:    Propylene Glycol (SYSTANE BALANCE OP), Apply 1 drop to eye 2 (two) times daily as needed (Dry eyes). Reported on 01/31/2016, Disp: , Rfl:    rosuvastatin (CRESTOR) 40 MG tablet, TAKE 1 TABLET BY MOUTH EVERY DAY, Disp: 90 tablet, Rfl: 3   Triamcinolone Acetonide (TRIAMCINOLONE 0.1 % CREAM : EUCERIN) CREA, Apply 1 application topically 2 (two) times daily., Disp: , Rfl:    vitamin B-12 (CYANOCOBALAMIN) 1000 MCG tablet, Take 1,000 mcg by mouth every other  day. Take one tablet every other day, Disp: , Rfl:  Social History   Socioeconomic History   Marital status: Married    Spouse name: Ronalee Belts   Number of children: 1   Years of education: 16   Highest education level: Bachelor's degree (e.g., BA, AB, BS)  Occupational History   Occupation: Retired     Comment: Pharmacist, hospital  Tobacco Use   Smoking status: Never   Smokeless tobacco: Never  Vaping Use   Vaping Use: Never used  Substance and Sexual Activity   Alcohol use: No   Drug use: No   Sexual activity: Not Currently    Birth control/protection: None    Comment: not much  Other Topics Concern   Not on file  Social History Narrative   Lives with husband in a one story home. Back end of home has steps leading out to a deck. Grandson stays with them 5 days per week while his mother works   Right handed   Drinks a lot of caffiene   Social Determinants of Radio broadcast assistant Strain: Low Risk    Difficulty of Paying Living Expenses: Not hard at all  Food Insecurity: No Food Insecurity   Worried About Charity fundraiser in the Last Year: Never true   Arboriculturist in the Last Year: Never true  Transportation Needs: No Transportation Needs   Lack of Transportation (Medical): No   Lack of Transportation (Non-Medical): No  Physical Activity: Insufficiently Active   Days of Exercise per Week: 5 days   Minutes of Exercise per Session: 10 min  Stress: Stress Concern Present   Feeling of Stress : To some extent  Social Connections: Engineer, building services of Communication with Friends and Family: More than three times a week   Frequency of Social Gatherings with Friends and Family: More than three times a week   Attends Religious Services: More than 4 times per year   Active Member of [employer] or Organizations: Yes   Attends Music therapist: More than 4 times per year   Marital Status: Married  Human resources officer Violence: Not At Risk   Fear of Current or  Ex-Partner: No   Emotionally Abused: No   Physically Abused: No   Sexually Abused: No   Family History  Problem Relation Age of Onset   COPD Mother        smoked   Parkinsonism Mother    Arthritis Mother    Epilepsy Mother  early in 20's   Myasthenia gravis Father    Asthma Father    Emphysema Father        smoked   Glaucoma Father    Arthritis Brother        knees   Heart disease Brother    Diabetes Brother    Hyperlipidemia Brother    Hyperlipidemia Brother    Hyperlipidemia Brother    Heart disease Brother    Hyperlipidemia Brother    CAD Paternal Grandmother 64   Epilepsy Daughter 74   Diabetes Daughter    Kidney disease Maternal Uncle    Colon cancer Neg Hx     Objective: Office vital signs reviewed. BP 137/66    Pulse 67    Temp 97.6 F (36.4 C)    Ht 5\' 3"  (1.6 m)    Wt 196 lb 6.4 oz (89.1 kg)    LMP 04/29/1991    SpO2 100%    BMI 34.79 kg/m   Physical Examination:  General: Awake, alert, nontoxic-appearing female, No acute distress HEENT: Sclera white Cardio: regular rate and rhythm, S1S2 heard, no murmurs appreciated Pulm: clear to auscultation bilaterally, no wheezes, rhonchi or rales; normal work of breathing on room air GI: Epigastric tenderness palpation present.  Abdomen soft.  Nondistended.  No palpable masses MSK: Ambulating independently.  No gross deformity of the heel appreciated  DG Os Calcis Right  Result Date: 11/28/2021 CLINICAL DATA:  Right heel pain. EXAM: RIGHT OS CALCIS - 2+ VIEW COMPARISON:  None. FINDINGS: Moderate to large plantar calcaneal heel spur. Minimal chronic spurring at the Achilles insertion on the calcaneus. Joint spaces appear preserved. No acute fracture or dislocation. IMPRESSION: Moderate to large plantar calcaneal heel spur. Electronically Signed   By: Yvonne Kendall M.D.   On: 11/28/2021 13:31      Assessment/ Plan: 71 y.o. female   Pain of right heel - Plan: DG Os Calcis Right  Plantar fasciitis - Plan: DG  Os Calcis Right  Calcaneal spur, right  Gastroesophageal reflux disease with esophagitis without hemorrhage - Plan: pantoprazole (PROTONIX) 40 MG tablet, Ambulatory referral to Gastroenterology  Plain film does demonstrate a calcaneal spur on the right.  I suspect that this is causing some plantar fasciitis.  I gave her home physical therapy stretches and recommendations to treat the plantar fasciitis.  If she has ongoing symptoms despite conservative measures, plan for referral to podiatry for consideration of foot injection and/or surgical intervention for the plantar spur.  I suspect that her GI symptoms are precipitated by uncontrolled GERD.  Her physical exam was notable for mild epigastric tenderness to palpation.  I am going to trial her on Protonix and place referral back to her GI provider at [facility] for the dysphagia she described.  No orders of the defined types were placed in this encounter.  No orders of the defined types were placed in this encounter.    [name], DO [facility] [phone]

## 2021-12-05 ENCOUNTER — Encounter: Payer: Self-pay | Admitting: Family Medicine

## 2021-12-05 ENCOUNTER — Encounter: Payer: Self-pay | Admitting: Internal Medicine

## 2021-12-05 ENCOUNTER — Other Ambulatory Visit: Payer: Self-pay | Admitting: Family Medicine

## 2022-01-17 ENCOUNTER — Ambulatory Visit: Payer: Medicare PPO | Admitting: Internal Medicine

## 2022-01-17 ENCOUNTER — Encounter: Payer: Self-pay | Admitting: Internal Medicine

## 2022-01-17 VITALS — BP 150/80 | Ht 63.0 in | Wt 198.0 lb

## 2022-01-17 DIAGNOSIS — R1319 Other dysphagia: Secondary | ICD-10-CM

## 2022-01-17 DIAGNOSIS — K219 Gastro-esophageal reflux disease without esophagitis: Secondary | ICD-10-CM

## 2022-01-17 DIAGNOSIS — R053 Chronic cough: Secondary | ICD-10-CM | POA: Diagnosis not present

## 2022-01-17 DIAGNOSIS — T7840XD Allergy, unspecified, subsequent encounter: Secondary | ICD-10-CM

## 2022-01-17 NOTE — Progress Notes (Signed)
? ?Miranda Wilson 71 y.o. 1951/03/09 [id number] ? ?Assessment & Plan:  ? ?Encounter Diagnoses  ?Name Primary?  ? Esophageal dysphagia Yes  ? Chronic cough   ? Gastroesophageal reflux disease, suspected   ? Allergic reaction to drug, pantoprazole   ? ?Evaluate these problems with EGD.  It sounds like mainly an esophageal issue I do have some question if there could be some sort of aspiration type symptomatology with the cough but it is not clear.  Could need a modified barium swallow pending results of the EGD. ? ?Depending upon what we see we may need to try a different PPI and hopefully it will not precipitate a reaction. ? ?The risks and benefits as well as alternatives of endoscopic procedure(s) have been discussed and reviewed. All questions answered. The patient agrees to proceed. ? ?I appreciate the opportunity to care for this patient. ?CC: Miranda Norlander, DO ? ? ? ? ?Subjective:  ? ?Chief Complaint: cough, dysphagia and reflux/regurgitation ? ?HPI ?71 year old white woman known to me from prior colonoscopy procedures (see below) who also has a history of hypothyroidism status post radioiodine therapy, remote seizure disorder, IBS, osteoarthritis, who presents with a several year history of intermittent solid food dysphagia, sometimes pill dysphagia and a sensation where she coughs a lot after ingesting different foods.  She does not eat steak anymore due to dysphagia.  She thinks she has lost a little bit of weight but nothing excessive.  She has never had an upper endoscopy.  She recalls at Christmas time bending over while doing dishes and having regurgitation of hot fluid all the way up her esophagus and throat and nose.  She saw Dr. Lajuana Wilson about this she was tender in the epigastrium and Dr. Lajuana Wilson prescribed pantoprazole but after 2 days the patient reported a diffuse rash so she stopped it.  That visit was on January 30 and was reviewed. ? ?She also think she has too much flatus.   Bowel movements are otherwise okay I believe.  GI review of systems otherwise negative. ? ?Normal colonoscopy 2017 history of diminutive adenoma 2009 plan for repeat 2027 (age/medical status permitting) ? ?Allergies  ?Allergen Reactions  ? Clindamycin/Lincomycin Other (See Comments)  ?  Dizzy spells  ? Codeine Nausea And Vomiting  ? Crestor [Rosuvastatin Calcium] Other (See Comments)  ?  Aching flu like large dose   ? Lipitor [Atorvastatin Calcium] Other (See Comments)  ?  Numbness in feet   ? Pravachol Other (See Comments)  ?  Back pain  ?  ? Simvastatin Other (See Comments)  ?  Back pain  ?  ? Ultram [Tramadol Hcl] Nausea And Vomiting  ? Vioxx [Rofecoxib] Other (See Comments)  ?  Fluid rentention , elevated blood pressure   ? Feldene [Piroxicam] Rash  ? Myrbetriq [Mirabegron] Other (See Comments)  ?  Constipation, headache  ? Penicillins Rash  ?  Pt states she CAN take Keflex  ? Protonix [Pantoprazole] Rash  ? Toviaz [Fesoterodine Fumarate Er] Other (See Comments)  ?  Dry skin, dry eyes, constipation  ? ?Current Meds  ?Medication Sig  ? Cholecalciferol (VITAMIN D) 2000 units CAPS Take by mouth.  ? levothyroxine (SYNTHROID) 150 MCG tablet TAKE 1/2 TABLET BY MOUTH EVERY DAY EXCEPT 1 TAB ON MON, FRI.  ? losartan (COZAAR) 25 MG tablet Take 1 tablet (25 mg total) by mouth daily.  ? Multiple Vitamins-Minerals (CENTRUM SILVER PO) Take 1 tablet by mouth daily.  ? Multiple Vitamins-Minerals (HAIR/SKIN/NAILS/BIOTIN) TABS Take by  mouth.  ? nabumetone (RELAFEN) 500 MG tablet Take 500 mg by mouth daily.  ? nystatin cream (MYCOSTATIN) Apply 1 application. topically as needed for dry skin.  ? Propylene Glycol (SYSTANE BALANCE OP) Apply 1 drop to eye 2 (two) times daily as needed (Dry eyes). Reported on 01/31/2016  ? rosuvastatin (CRESTOR) 40 MG tablet TAKE 1 TABLET BY MOUTH EVERY DAY  ? Triamcinolone Acetonide (TRIAMCINOLONE 0.1 % CREAM : EUCERIN) CREA Apply 1 application topically 2 (two) times daily.  ? vitamin B-12  (CYANOCOBALAMIN) 1000 MCG tablet Take 1,000 mcg by mouth every other day. Take one tablet every other day  ? ?Past Medical History:  ?Diagnosis Date  ? At risk for sleep apnea   ? Bilateral edema of lower extremity   ? left > right --  wear compression hose  ? Cervical spondylosis   ? Chronic constipation   ? Cystitis 0623-7628'B  ? Dr Miranda Wilson - here   ? GERD (gastroesophageal reflux disease)   ? History of colon polyps   ? History of idiopathic seizure   ? AGE 89 to 20  X5  --  UNKNOW IDIOLOGY , PER PT WAS ANEMIC AT THE TIME--  NONE SINCE  ? History of tachycardia   ? S/P  RADIOACTIVE IODINE ABLATION OF THYROID  ? Hyperlipidemia   ? Hypertension   ? Hypothyroidism, postradioiodine therapy   ? AGE 56  ? IBS (irritable bowel syndrome)   ? Neuropathy   ? OA (osteoarthritis)   ? shoulders  left > right  ? PMB (postmenopausal bleeding)   ? S/P radioactive iodine thyroid ablation   ? Urge urinary incontinence   ? Varicose veins   ? Vein disorder   ? LEFT ANKLE VEIN VALVE REFLUX WITH DECREASED CIRCULATION   ? ?Past Surgical History:  ?Procedure Laterality Date  ? CATARACT EXTRACTION W/ INTRAOCULAR LENS  IMPLANT, BILATERAL  right 2011//  left 2013  ? Miranda Wilson  ? CLOSED LEFT KNEE MANIPULATION AND RIGHT KNEE CORTISONE INJECTION  02-20-2010  ? COLONOSCOPY W/ POLYPECTOMY  10-25-2008  ? DILATION AND CURETTAGE OF UTERUS    ? ENDOVENOUS ABLATION SAPHENOUS VEIN W/ LASER  04/2007  ? EYE SURGERY Bilateral   ? cataract  ? EYE SURGERY Bilateral   ? eyelid lifts /// left eye - placed removed  ? HYSTEROSCOPY WITH D & C N/A 11/11/2014  ? Procedure: DILATATION AND CURETTAGE /HYSTEROSCOPY;  Surgeon: Miranda Height, MD;  Location: [facility];  Service: Gynecology;  Laterality: N/A;  ? JOINT REPLACEMENT    ? [place]  ? and BILATERAL TUBAL LIGATION  ? LEFT KNEE ARTHROTOMY W/ LYSIS ADHESIONS  12-01-2010  ? MOUTH SURGERY    ? dental implants  ? TONSILLECTOMY  1955  ? TOTAL KNEE ARTHROPLASTY Left  12-19-2009  ? TOTAL KNEE ARTHROPLASTY Right 02/13/2016  ? Procedure: RIGHT TOTAL KNEE ARTHROPLASTY;  Surgeon: Miranda Arabian, MD;  Location: [facility];  Service: Orthopedics;  Laterality: Right;  ? TUBAL LIGATION    ? [place] to 2010  ? includes laser and phlebectomies  ? ?Social History  ? ?Social History Narrative  ? Lives with husband in a one story home. Back end of home has steps leading out to a deck. Grandson stays with them 5 days per week while his mother works  ? Right handed  ? Drinks a lot of caffiene  ? ?family history includes Arthritis in her brother and mother; Asthma in her  father; CAD (age of onset: 66) in her paternal grandmother; COPD in her mother; Diabetes in her brother and daughter; Emphysema in her father; Epilepsy in her mother; Epilepsy (age of onset: 26) in her daughter; Glaucoma in her father; Heart disease in her brother and brother; Hyperlipidemia in her brother, brother, brother, and brother; Kidney disease in her maternal uncle; Myasthenia gravis in her father; Parkinsonism in her mother. ? ? ?Review of Systems ?Heel pain bone spur question plantar fasciitis ?Poor mobility due to arthritis uses a cane ?All other review of systems negative ? ?Objective:  ? Physical Exam ?'@BP'$  (!) 150/80   Ht '5\' 3"'$  (1.6 m)   Wt 198 lb (89.8 kg)   LMP 04/29/1991   BMI 35.07 kg/m? @ ? ?General:  Well-developed, well-nourished and in no acute distress ?Eyes:  anicteric. ?ENT:   Mouth and posterior pharynx free of lesions.  ?Neck:   supple w/o thyromegaly or mass.  ?Lungs: Clear to auscultation bilaterally. ?Heart:  S1S2, no rubs, murmurs, gallops. ?Abdomen:  Obese, soft, non-tender, no hepatosplenomegaly, hernia, or mass and BS+.  ? ?Lymph:  no cervical or supraclavicular adenopathy. ?Extremities:   no edema, cyanosis or clubbing ?Marland Kitchen ?Neuro:  A&O x 3.  ?Psych:  appropriate mood and  Affect. ? ? ?Data Reviewed: ?See HPI ?I have also reviewed labs in the EMR from October 2022 in January  2023 with a thyroid panel that was overall normal except a very slight T3 uptake ratio being low, normal CMET in October 22, good lipid panel then as well B12 899, with last CBC normal December 2021. ? ?

## 2022-01-17 NOTE — Patient Instructions (Signed)
You have been scheduled for an endoscopy. Please follow written instructions given to you at your visit today. ?If you use inhalers (even only as needed), please bring them with you on the day of your procedure. ? ?If you are age 73 or older, your body mass index should be between 23-30. Your Body mass index is 35.07 kg/m?Marland Kitchen If this is out of the aforementioned range listed, please consider follow up with your Primary Care Provider. ? ?If you are age 63 or younger, your body mass index should be between 19-25. Your Body mass index is 35.07 kg/m?Marland Kitchen If this is out of the aformentioned range listed, please consider follow up with your Primary Care Provider.  ? ?________________________________________________________ ? ?The [facility] providers would like to encourage you to use [facility] to communicate with providers for non-urgent requests or questions.  Due to long hold times on the telephone, sending your provider a message by [facility] may be a faster and more efficient way to get a response.  Please allow 48 business hours for a response.  Please remember that this is for non-urgent requests.  ?_______________________________________________________ ? ? ?I appreciate the opportunity to care for you. ?Silvano Rusk, MD, [facility] ?

## 2022-01-31 ENCOUNTER — Encounter: Payer: Medicare PPO | Admitting: Family Medicine

## 2022-02-15 ENCOUNTER — Other Ambulatory Visit: Payer: Self-pay | Admitting: Family Medicine

## 2022-03-08 ENCOUNTER — Encounter: Payer: Self-pay | Admitting: Internal Medicine

## 2022-03-08 ENCOUNTER — Ambulatory Visit (AMBULATORY_SURGERY_CENTER): Payer: Medicare PPO | Admitting: Internal Medicine

## 2022-03-08 VITALS — BP 143/74 | HR 59 | Temp 98.4°F | Resp 23 | Ht 63.0 in | Wt 198.0 lb

## 2022-03-08 DIAGNOSIS — R12 Heartburn: Secondary | ICD-10-CM

## 2022-03-08 DIAGNOSIS — K319 Disease of stomach and duodenum, unspecified: Secondary | ICD-10-CM

## 2022-03-08 DIAGNOSIS — K449 Diaphragmatic hernia without obstruction or gangrene: Secondary | ICD-10-CM

## 2022-03-08 DIAGNOSIS — R051 Acute cough: Secondary | ICD-10-CM

## 2022-03-08 DIAGNOSIS — K3189 Other diseases of stomach and duodenum: Secondary | ICD-10-CM | POA: Diagnosis not present

## 2022-03-08 DIAGNOSIS — R1319 Other dysphagia: Secondary | ICD-10-CM | POA: Diagnosis not present

## 2022-03-08 DIAGNOSIS — R1310 Dysphagia, unspecified: Secondary | ICD-10-CM | POA: Diagnosis not present

## 2022-03-08 MED ORDER — SODIUM CHLORIDE 0.9 % IV SOLN: 500.0000 mL | Freq: Once | INTRAVENOUS | Status: DC

## 2022-03-08 NOTE — Op Note (Signed)
[place] ?Patient Name: Miranda Wilson ?Procedure Date: 03/08/2022 10:22 AM ?MRN: [id number] ?Endoscopist: Gatha Mayer , MD ?Age: 71 ?Referring MD:  ?Date of Birth: 1951/05/26 ?Gender: Female ?Account #: [id number] ?Procedure:                Upper GI endoscopy ?Indications:              Dysphagia, Heartburn, Chronic cough ?Medicines:                Monitored Anesthesia Care ?Procedure:                Pre-Anesthesia Assessment: ?                          - Prior to the procedure, a History and Physical  ?                          was performed, and patient medications and  ?                          allergies were reviewed. The patient's tolerance of  ?                          previous anesthesia was also reviewed. The risks  ?                          and benefits of the procedure and the sedation  ?                          options and risks were discussed with the patient.  ?                          All questions were answered, and informed consent  ?                          was obtained. Prior Anticoagulants: The patient has  ?                          taken no previous anticoagulant or antiplatelet  ?                          agents. ASA Grade Assessment: II - A patient with  ?                          mild systemic disease. After reviewing the risks  ?                          and benefits, the patient was deemed in  ?                          satisfactory condition to undergo the procedure. ?                          After obtaining informed consent, the endoscope was  ?  passed under direct vision. Throughout the  ?                          procedure, the patient's blood pressure, pulse, and  ?                          oxygen saturations were monitored continuously. The  ?                          GIF [id number] #[id number] was introduced through the  ?                          mouth, and advanced to the second part of duodenum.  ?                          The upper GI  endoscopy was accomplished without  ?                          difficulty. The patient tolerated the procedure  ?                          well. ?Scope In: ?Scope Out: ?Findings:                 No endoscopic abnormality was evident in the  ?                          esophagus to explain the patient's complaint of  ?                          dysphagia. It was decided, however, to proceed with  ?                          dilation of the entire esophagus. The scope was  ?                          withdrawn. Dilation was performed with a Venia Minks  ?                          dilator with mild resistance at 92 Fr. The dilation  ?                          site was examined following endoscope reinsertion  ?                          and showed slight heme in UES area. Estimated blood  ?                          loss was minimal. ?                          The gastroesophageal flap valve was visualized  ?                          endoscopically  and classified as Hill Grade IV (no  ?                          fold, wide open lumen, hiatal hernia present). ?                          A 2 cm hiatal hernia was present. ?                          Striped moderately erythematous mucosa was found in  ?                          the gastric antrum. Biopsies were taken with a cold  ?                          forceps for histology. Verification of patient  ?                          identification for the specimen was done. Estimated  ?                          blood loss was minimal. ?                          The examined duodenum was normal. ?                          The cardia and gastric fundus were normal on  ?                          retroflexion. ?                          The exam was otherwise without abnormality. ?Complications:            No immediate complications. ?Estimated Blood Loss:     Estimated blood loss was minimal. ?Impression:               - No endoscopic esophageal abnormality to explain  ?                           patient's dysphagia. GE junction was patulous.  ?                          Esophagus dilated. Dilated 54 Fr - some UES area  ?                          heme ? occult web or stricture dilated ?                          - Gastroesophageal flap valve classified as Hill  ?                          Grade IV (no fold, wide open lumen, hiatal hernia  ?  present). ?                          - 2 cm hiatal hernia. ?                          - Erythematous mucosa in the antrum. Biopsied. ?                          - Normal examined duodenum. ?                          - The examination was otherwise normal. ?Recommendation:           - Patient has a contact number available for  ?                          emergencies. The signs and symptoms of potential  ?                          delayed complications were discussed with the  ?                          patient. Return to normal activities tomorrow.  ?                          Written discharge instructions were provided to the  ?                          patient. ?                          - Clear liquids x 1 hour then soft foods rest of  ?                          day. Start prior diet tomorrow. ?                          - Continue present medications. ?                          - Await pathology results. If dysphagia persists  ?                          then barium swallow and modified barium swallow  ?                          will be ordered ?Gatha Mayer, MD ?03/08/2022 10:41:03 AM ?This report has been signed electronically. ?

## 2022-03-08 NOTE — Progress Notes (Signed)
[facility] History and Physical ? ? ?Primary Care Physician:  [name], DO ? ? ?Reason for Procedure:   Cough and dysphagia ? ?Plan:    EGD ? ? ? ? ?HPI: Miranda Wilson is a 71 y.o. female  seen 01/17/2022 with A/P and HPI as below - issues are the same ? ?Encounter Diagnoses  ?Name Primary?  ? Esophageal dysphagia Yes  ? Chronic cough    ? Gastroesophageal reflux disease, suspected    ? Allergic reaction to drug, pantoprazole    ?  ?Evaluate these problems with EGD.  It sounds like mainly an esophageal issue I do have some question if there could be some sort of aspiration type symptomatology with the cough but it is not clear.  Could need a modified barium swallow pending results of the EGD. ?  ?Depending upon what we see we may need to try a different PPI and hopefully it will not precipitate a reaction. ?  ?The risks and benefits as well as alternatives of endoscopic procedure(s) have been discussed and reviewed. All questions answered. The patient agrees to proceed. ?  ?I appreciate the opportunity to care for this patient. ?CC: [name], DO ?  ?  ?  ?  ?Subjective:  ?  ?Chief Complaint: cough, dysphagia and reflux/regurgitation ?  ?HPI ?71 year old white woman known to me from prior colonoscopy procedures (see below) who also has a history of hypothyroidism status post radioiodine therapy, remote seizure disorder, IBS, osteoarthritis, who presents with a several year history of intermittent solid food dysphagia, sometimes pill dysphagia and a sensation where she coughs a lot after ingesting different foods.  She does not eat steak anymore due to dysphagia.  She thinks she has lost a little bit of weight but nothing excessive.  She has never had an upper endoscopy.  She recalls at Christmas time bending over while doing dishes and having regurgitation of hot fluid all the way up her esophagus and throat and nose.  She saw Dr. Lajuana Ripple about this she was tender in the  epigastrium and Dr. Lajuana Ripple prescribed pantoprazole but after 2 days the patient reported a diffuse rash so she stopped it.  That visit was on January 30 and was reviewed. ?  ?She also think she has too much flatus.  Bowel movements are otherwise okay I believe.  GI review of systems otherwise negative. ?  ?Normal colonoscopy 2017 history of diminutive adenoma 2009 plan for repeat 2027 (age/medical status permitting) ?  ? ?Past Medical History:  ?Diagnosis Date  ? At risk for sleep apnea   ? Bilateral edema of lower extremity   ? left > right --  wear compression hose  ? Cervical spondylosis   ? Chronic constipation   ? Cystitis [id number]'P  ? Dr Prince Rome - here   ? GERD (gastroesophageal reflux disease)   ? History of colon polyps   ? History of idiopathic seizure   ? AGE 56 to 20  X5  --  UNKNOW IDIOLOGY , PER PT WAS ANEMIC AT THE TIME--  NONE SINCE  ? History of tachycardia   ? S/P  RADIOACTIVE IODINE ABLATION OF THYROID  ? Hyperlipidemia   ? Hypertension   ? Hypothyroidism, postradioiodine therapy   ? AGE 2  ? IBS (irritable bowel syndrome)   ? Neuropathy   ? OA (osteoarthritis)   ? shoulders  left > right  ? PMB (postmenopausal bleeding)   ? S/P radioactive iodine thyroid ablation   ?  Urge urinary incontinence   ? Varicose veins   ? Vein disorder   ? LEFT ANKLE VEIN VALVE REFLUX WITH DECREASED CIRCULATION   ? ? ?Past Surgical History:  ?Procedure Laterality Date  ? CATARACT EXTRACTION W/ INTRAOCULAR LENS  IMPLANT, BILATERAL  right 2011//  left 2013  ? [place]  ? CLOSED LEFT KNEE MANIPULATION AND RIGHT KNEE CORTISONE INJECTION  02-20-2010  ? COLONOSCOPY W/ POLYPECTOMY  10-25-2008  ? DILATION AND CURETTAGE OF UTERUS    ? ENDOVENOUS ABLATION SAPHENOUS VEIN W/ LASER  04/2007  ? EYE SURGERY Bilateral   ? cataract  ? EYE SURGERY Bilateral   ? eyelid lifts /// left eye - placed removed  ? HYSTEROSCOPY WITH D & C N/A 11/11/2014  ? Procedure: DILATATION AND CURETTAGE /HYSTEROSCOPY;  Surgeon: Gus Height,  MD;  Location: [facility];  Service: Gynecology;  Laterality: N/A;  ? JOINT REPLACEMENT    ? [place]  ? and BILATERAL TUBAL LIGATION  ? LEFT KNEE ARTHROTOMY W/ LYSIS ADHESIONS  12-01-2010  ? MOUTH SURGERY    ? dental implants  ? TONSILLECTOMY  1955  ? TOTAL KNEE ARTHROPLASTY Left 12-19-2009  ? TOTAL KNEE ARTHROPLASTY Right 02/13/2016  ? Procedure: RIGHT TOTAL KNEE ARTHROPLASTY;  Surgeon: Gaynelle Arabian, MD;  Location: [facility];  Service: Orthopedics;  Laterality: Right;  ? TUBAL LIGATION    ? [place] to 2010  ? includes laser and phlebectomies  ? ? ?Prior to Admission medications   ?Medication Sig Start Date End Date Taking? Authorizing Provider  ?cephALEXin (KEFLEX) 500 MG capsule Take 500 mg by mouth as needed (Before dental procedures).   Yes [provider]  ?Cholecalciferol (VITAMIN D) 2000 units CAPS Take by mouth.   Yes [provider]  ?levothyroxine (SYNTHROID) 150 MCG tablet TAKE 1/2 TABLET BY MOUTH EVERY DAY EXCEPT 1 TAB ON MON, FRI. 02/15/22  Yes Gottschalk, Ashly M, DO  ?losartan (COZAAR) 25 MG tablet Take 1 tablet (25 mg total) by mouth daily. 08/01/21  Yes [name], DO  ?Multiple Vitamins-Minerals (CENTRUM SILVER PO) Take 1 tablet by mouth daily.   Yes [provider]  ?Multiple Vitamins-Minerals (HAIR/SKIN/NAILS/BIOTIN) TABS Take by mouth.   Yes [provider]  ?nystatin cream (MYCOSTATIN) Apply 1 application. topically as needed for dry skin.   Yes [provider]  ?rosuvastatin (CRESTOR) 40 MG tablet TAKE 1 TABLET BY MOUTH EVERY DAY 03/21/21  Yes [name], DO  ?Triamcinolone Acetonide (TRIAMCINOLONE 0.1 % CREAM : EUCERIN) CREA Apply 1 application topically 2 (two) times daily.   Yes [provider]  ?vitamin B-12 (CYANOCOBALAMIN) 1000 MCG tablet Take 1,000 mcg by mouth every other day. Take one tablet every other day   Yes [provider]  ?nabumetone  (RELAFEN) 500 MG tablet Take 500 mg by mouth daily.    [provider]  ?Propylene Glycol (SYSTANE BALANCE OP) Apply 1 drop to eye 2 (two) times daily as needed (Dry eyes). Reported on 01/31/2016    [provider]  ? ? ?Current Outpatient Medications  ?Medication Sig Dispense Refill  ? cephALEXin (KEFLEX) 500 MG capsule Take 500 mg by mouth as needed (Before dental procedures).    ? Cholecalciferol (VITAMIN D) 2000 units CAPS Take by mouth.    ? levothyroxine (SYNTHROID) 150 MCG tablet TAKE 1/2 TABLET BY MOUTH EVERY DAY EXCEPT 1 TAB ON MON, FRI. 54 tablet 2  ? losartan (COZAAR) 25 MG  tablet Take 1 tablet (25 mg total) by mouth daily. 90 tablet 3  ? Multiple Vitamins-Minerals (CENTRUM SILVER PO) Take 1 tablet by mouth daily.    ? Multiple Vitamins-Minerals (HAIR/SKIN/NAILS/BIOTIN) TABS Take by mouth.    ? nystatin cream (MYCOSTATIN) Apply 1 application. topically as needed for dry skin.    ? rosuvastatin (CRESTOR) 40 MG tablet TAKE 1 TABLET BY MOUTH EVERY DAY 90 tablet 3  ? Triamcinolone Acetonide (TRIAMCINOLONE 0.1 % CREAM : EUCERIN) CREA Apply 1 application topically 2 (two) times daily.    ? vitamin B-12 (CYANOCOBALAMIN) 1000 MCG tablet Take 1,000 mcg by mouth every other day. Take one tablet every other day    ? nabumetone (RELAFEN) 500 MG tablet Take 500 mg by mouth daily.    ? Propylene Glycol (SYSTANE BALANCE OP) Apply 1 drop to eye 2 (two) times daily as needed (Dry eyes). Reported on 01/31/2016    ? ?Current Facility-Administered Medications  ?Medication Dose Route Frequency Provider Last Rate Last Admin  ? 0.9 %  sodium chloride infusion  500 mL Intravenous Once Gatha Mayer, MD      ? ? ?Allergies as of 03/08/2022 - Review Complete 03/08/2022  ?Allergen Reaction Noted  ? Clindamycin/lincomycin Other (See Comments) 11/08/2014  ? Codeine Nausea And Vomiting 10/11/2008  ? Crestor [rosuvastatin calcium] Other (See Comments) 03/07/2011  ? Lipitor [atorvastatin calcium] Other (See Comments)  03/07/2011  ? Pravachol Other (See Comments) 03/07/2011  ? Simvastatin Other (See Comments) 03/07/2011  ? Ultram [tramadol hcl] Nausea And Vomiting 10/11/2008  ? Vioxx [rofecoxib] Other (See Comments) 03/07/2011  ? Fe

## 2022-03-08 NOTE — Patient Instructions (Addendum)
I did not see any obstruction or narrowing in the esophagus - I did dilate to see if that will help. It may have had some effect in the upper esophagus. ? ?I took stomach biopsies because it was red and that could be inflammation and/or infection. ? ?Small hiatal hernia seen. ? ?No damage from acid reflux seen. ? ?Once I have results we will contact you to inform and reassess. ? ?I appreciate the opportunity to care for you. ?Gatha Mayer, MD, [facility] ? ? ?Handouts on esophagitis and stricture, gastritis, heartburn, hiatal hernia, and GERD.  Instructions given for post-dilation diet. ? ?YOU HAD AN ENDOSCOPIC PROCEDURE TODAY AT [facility]:   Refer to the procedure report that was given to you for any specific questions about what was found during the examination.  If the procedure report does not answer your questions, please call your gastroenterologist to clarify.  If you requested that your care partner not be given the details of your procedure findings, then the procedure report has been included in a sealed envelope for you to review at your convenience later. ? ?YOU SHOULD EXPECT: Some feelings of bloating in the abdomen. Passage of more gas than usual.  Walking can help get rid of the air that was put into your GI tract during the procedure and reduce the bloating. If you had a lower endoscopy (such as a colonoscopy or flexible sigmoidoscopy) you may notice spotting of blood in your stool or on the toilet paper. If you underwent a bowel prep for your procedure, you may not have a normal bowel movement for a few days. ? ?Please Note:  You might notice some irritation and congestion in your nose or some drainage.  This is from the oxygen used during your procedure.  There is no need for concern and it should clear up in a day or so. ? ?SYMPTOMS TO REPORT IMMEDIATELY: ? ? ?Following upper endoscopy (EGD) ? Vomiting of blood or coffee ground material ? New chest pain or pain under the shoulder  blades ? Painful or persistently difficult swallowing ? New shortness of breath ? Fever of 100?F or higher ? Black, tarry-looking stools ? ?For urgent or emergent issues, a gastroenterologist can be reached at any hour by calling [phone]. ?Do not use MyChart messaging for urgent concerns.  ? ? ?DIET:  Nothing by mouth until 11:35 a.m., then clear liquids from 11:35-12-35.  Soft diet for the rest of the day.  May resume prior diet tomorrow.  Drink plenty of fluids but you should avoid alcoholic beverages for 24 hours. ? ?ACTIVITY:  You should plan to take it easy for the rest of today and you should NOT DRIVE or use heavy machinery until tomorrow (because of the sedation medicines used during the test).   ? ?FOLLOW UP: ?Our staff will call the number listed on your records 48-72 hours following your procedure to check on you and address any questions or concerns that you may have regarding the information given to you following your procedure. If we do not reach you, we will leave a message.  We will attempt to reach you two times.  During this call, we will ask if you have developed any symptoms of COVID 19. If you develop any symptoms (ie: fever, flu-like symptoms, shortness of breath, cough etc.) before then, please call [phone].  If you test positive for Covid 19 in the 2 weeks post procedure, please call and report this information to  [place].   ? ?If any biopsies were taken you will be contacted by phone or by letter within the next 1-3 weeks.  Please call us at [phone] if you have not heard about the biopsies in 3 weeks.  ? ? ?SIGNATURES/CONFIDENTIALITY: ?You and/or your care partner have signed paperwork which will be entered into your electronic medical record.  These signatures attest to the fact that that the information above on your After Visit Summary has been reviewed and is understood.  Full responsibility of the confidentiality of this discharge information lies with you and/or your  care-partner.  ?

## 2022-03-08 NOTE — Progress Notes (Signed)
Report to pacu rn; vss. ?

## 2022-03-08 NOTE — Progress Notes (Signed)
Called to room to assist during endoscopic procedure.  Patient ID and intended procedure confirmed with present staff. Received instructions for my participation in the procedure from the performing physician.  

## 2022-03-12 ENCOUNTER — Telehealth: Payer: Self-pay | Admitting: *Deleted

## 2022-03-12 ENCOUNTER — Telehealth: Payer: Self-pay

## 2022-03-12 NOTE — Telephone Encounter (Signed)
?  Follow up Call- ? ? ?  03/08/2022  ?  9:07 AM  ?Call back number  ?Post procedure Call Back phone  # [phone]  ?Permission to leave phone message Yes  ?  ? ?Patient questions: ? ?Do you have a fever, pain , or abdominal swelling? No. ?Pain Score  0 * ? ?Have you tolerated food without any problems? Yes.   ? ?Have you been able to return to your normal activities? Yes.   ? ?Do you have any questions about your discharge instructions: ?Diet   No. ?Medications  No. ?Follow up visit  No. ? ?Do you have questions or concerns about your Care? No. ? ?Actions: ?* If pain score is 4 or above: ?No action needed, pain <4. ? ? ?

## 2022-03-12 NOTE — Telephone Encounter (Signed)
Follow up call placed, VM obtained and message left. ?SChaplin, RN,BSN ? ?

## 2022-04-01 ENCOUNTER — Other Ambulatory Visit: Payer: Self-pay | Admitting: Family Medicine

## 2022-04-01 DIAGNOSIS — E7800 Pure hypercholesterolemia, unspecified: Secondary | ICD-10-CM

## 2022-04-13 NOTE — Patient Instructions (Signed)
Miranda Wilson , Thank you for taking time to come for your Medicare Wellness Visit. I appreciate your ongoing commitment to your health goals. Please review the following plan we discussed and let me know if I can assist you in the future.   Screening recommendations/referrals: Colonoscopy: Done 10/11/2016 - Repeat in 10 years  Mammogram: Done 06/28/2021 - Repeat annually Bone Density: Done 08/16/2021 - Repeat in 5 years  Recommended yearly ophthalmology/optometry visit for glaucoma screening and checkup Recommended yearly dental visit for hygiene and checkup  Vaccinations: Influenza vaccine: Done 08/01/2021 - Repeat annually  Pneumococcal vaccine: Done 02/03/2015 & 11/23/2016 Tdap vaccine: Done 11/12/2011 - Repeat in 10 years *due Shingles vaccine: Due* Shingrix is 2 doses 2-6 months apart and over 90% effective     Covid-19: Done 03/19/2020, 04/09/2020, 10/15/2020, & 05/13/2021  Advanced directives: Please bring a copy of your health care power of attorney and living will to the office to be added to your chart at your convenience.   Conditions/risks identified: Aim for 30 minutes of exercise or brisk walking, 6-8 glasses of water, and 5 servings of fruits and vegetables each day.   Next appointment: Follow up in one year for your annual wellness visit    Preventive Care 65 Years and Older, Female Preventive care refers to lifestyle choices and visits with your health care provider that can promote health and wellness. What does preventive care include? A yearly physical exam. This is also called an annual well check. Dental exams once or twice a year. Routine eye exams. Ask your health care provider how often you should have your eyes checked. Personal lifestyle choices, including: Daily care of your teeth and gums. Regular physical activity. Eating a healthy diet. Avoiding tobacco and drug use. Limiting alcohol use. Practicing safe sex. Taking low-dose aspirin every day. Taking vitamin  and mineral supplements as recommended by your health care provider. What happens during an annual well check? The services and screenings done by your health care provider during your annual well check will depend on your age, overall health, lifestyle risk factors, and family history of disease. Counseling  Your health care provider may ask you questions about your: Alcohol use. Tobacco use. Drug use. Emotional well-being. Home and relationship well-being. Sexual activity. Eating habits. History of falls. Memory and ability to understand (cognition). Work and work Statistician. Reproductive health. Screening  You may have the following tests or measurements: Height, weight, and BMI. Blood pressure. Lipid and cholesterol levels. These may be checked every 5 years, or more frequently if you are over 54 years old. Skin check. Lung cancer screening. You may have this screening every year starting at age 26 if you have a 30-pack-year history of smoking and currently smoke or have quit within the past 15 years. Fecal occult blood test (FOBT) of the stool. You may have this test every year starting at age 74. Flexible sigmoidoscopy or colonoscopy. You may have a sigmoidoscopy every 5 years or a colonoscopy every 10 years starting at age 41. Hepatitis C blood test. Hepatitis B blood test. Sexually transmitted disease (STD) testing. Diabetes screening. This is done by checking your blood sugar (glucose) after you have not eaten for a while (fasting). You may have this done every 1-3 years. Bone density scan. This is done to screen for osteoporosis. You may have this done starting at age 23. Mammogram. This may be done every 1-2 years. Talk to your health care provider about how often you should have regular mammograms.  Talk with your health care provider about your test results, treatment options, and if necessary, the need for more tests. Vaccines  Your health care provider may recommend  certain vaccines, such as: Influenza vaccine. This is recommended every year. Tetanus, diphtheria, and acellular pertussis (Tdap, Td) vaccine. You may need a Td booster every 10 years. Zoster vaccine. You may need this after age 61. Pneumococcal 13-valent conjugate (PCV13) vaccine. One dose is recommended after age 76. Pneumococcal polysaccharide (PPSV23) vaccine. One dose is recommended after age 31. Talk to your health care provider about which screenings and vaccines you need and how often you need them. This information is not intended to replace advice given to you by your health care provider. Make sure you discuss any questions you have with your health care provider. Document Released: 11/11/2015 Document Revised: 07/04/2016 Document Reviewed: 08/16/2015 Elsevier Interactive Patient Education  2017 [employer] Prevention in the Home Falls can cause injuries. They can happen to people of all ages. There are many things you can do to make your home safe and to help prevent falls. What can I do on the outside of my home? Regularly fix the edges of walkways and driveways and fix any cracks. Remove anything that might make you trip as you walk through a door, such as a raised step or threshold. Trim any bushes or trees on the path to your home. Use bright outdoor lighting. Clear any walking paths of anything that might make someone trip, such as rocks or tools. Regularly check to see if handrails are loose or broken. Make sure that both sides of any steps have handrails. Any raised decks and porches should have guardrails on the edges. Have any leaves, snow, or ice cleared regularly. Use sand or salt on walking paths during winter. Clean up any spills in your garage right away. This includes oil or grease spills. What can I do in the bathroom? Use night lights. Install grab bars by the toilet and in the tub and shower. Do not use towel bars as grab bars. Use non-skid mats or  decals in the tub or shower. If you need to sit down in the shower, use a plastic, non-slip stool. Keep the floor dry. Clean up any water that spills on the floor as soon as it happens. Remove soap buildup in the tub or shower regularly. Attach bath mats securely with double-sided non-slip rug tape. Do not have throw rugs and other things on the floor that can make you trip. What can I do in the bedroom? Use night lights. Make sure that you have a light by your bed that is easy to reach. Do not use any sheets or blankets that are too big for your bed. They should not hang down onto the floor. Have a firm chair that has side arms. You can use this for support while you get dressed. Do not have throw rugs and other things on the floor that can make you trip. What can I do in the kitchen? Clean up any spills right away. Avoid walking on wet floors. Keep items that you use a lot in easy-to-reach places. If you need to reach something above you, use a strong step stool that has a grab bar. Keep electrical cords out of the way. Do not use floor polish or wax that makes floors slippery. If you must use wax, use non-skid floor wax. Do not have throw rugs and other things on the floor that can make  you trip. What can I do with my stairs? Do not leave any items on the stairs. Make sure that there are handrails on both sides of the stairs and use them. Fix handrails that are broken or loose. Make sure that handrails are as long as the stairways. Check any carpeting to make sure that it is firmly attached to the stairs. Fix any carpet that is loose or worn. Avoid having throw rugs at the top or bottom of the stairs. If you do have throw rugs, attach them to the floor with carpet tape. Make sure that you have a light switch at the top of the stairs and the bottom of the stairs. If you do not have them, ask someone to add them for you. What else can I do to help prevent falls? Wear shoes that: Do not  have high heels. Have rubber bottoms. Are comfortable and fit you well. Are closed at the toe. Do not wear sandals. If you use a stepladder: Make sure that it is fully opened. Do not climb a closed stepladder. Make sure that both sides of the stepladder are locked into place. Ask someone to hold it for you, if possible. Clearly mark and make sure that you can see: Any grab bars or handrails. First and last steps. Where the edge of each step is. Use tools that help you move around (mobility aids) if they are needed. These include: Canes. Walkers. Scooters. Crutches. Turn on the lights when you go into a dark area. Replace any light bulbs as soon as they burn out. Set up your furniture so you have a clear path. Avoid moving your furniture around. If any of your floors are uneven, fix them. If there are any pets around you, be aware of where they are. Review your medicines with your doctor. Some medicines can make you feel dizzy. This can increase your chance of falling. Ask your doctor what other things that you can do to help prevent falls. This information is not intended to replace advice given to you by your health care provider. Make sure you discuss any questions you have with your health care provider. Document Released: 08/11/2009 Document Revised: 03/22/2016 Document Reviewed: 11/19/2014 Elsevier Interactive Patient Education  2017 [employer].

## 2022-04-16 ENCOUNTER — Ambulatory Visit (INDEPENDENT_AMBULATORY_CARE_PROVIDER_SITE_OTHER): Payer: Medicare PPO

## 2022-04-16 ENCOUNTER — Telehealth: Payer: Self-pay

## 2022-04-16 VITALS — BP 121/66 | HR 61 | Wt 198.0 lb

## 2022-04-16 DIAGNOSIS — Z0000 Encounter for general adult medical examination without abnormal findings: Secondary | ICD-10-CM | POA: Diagnosis not present

## 2022-04-16 NOTE — Progress Notes (Signed)
Subjective:   Miranda Wilson is a 71 y.o. female who presents for Medicare Annual (Subsequent) preventive examination.  Virtual Visit via Telephone Note  I connected with  Miranda Wilson on 04/16/22 at  8:15 AM EDT by telephone and verified that I am speaking with the correct person using two identifiers.  Location: Patient: Home Provider: [facility] Persons participating in the virtual visit: patient/Nurse Health Advisor   I discussed the limitations, risks, security and privacy concerns of performing an evaluation and management service by telephone and the availability of in person appointments. The patient expressed understanding and agreed to proceed.  Interactive audio and video telecommunications were attempted between this nurse and patient, however failed, due to patient having technical difficulties OR patient did not have access to video capability.  We continued and completed visit with audio only.  Some vital signs may be absent or patient reported.   Yusuf Yu E Lauralyn Shadowens, LPN   Review of Systems     Cardiac Risk Factors include: advanced age (>30mn, >>13women);obesity (BMI >30kg/m2);sedentary lifestyle;dyslipidemia;hypertension;Other (see comment), Risk factor comments: venous insufficiency, lipodermatosclerosis bilateral lower extremities     Objective:    Today's Vitals   04/16/22 0809  BP: 121/66  Pulse: 61  Weight: 198 lb (89.8 kg)  PainSc: 6    Body mass index is 35.07 kg/m.     04/16/2022    8:31 AM 04/13/2021    9:36 AM 11/21/2020    8:50 AM 04/12/2020    8:54 AM 04/08/2019    9:03 AM 12/20/2016   10:54 AM 10/01/2016    9:53 AM  Advanced Directives  Does Patient Have a Medical Advance Directive? Yes Yes No No No Yes Yes  Type of AParamed[place]of [place] will [place] will    [place] will [place] will  Does patient want to make changes to medical  advance directive?      Yes (Inpatient - patient defers changing a medical advance directive at this time)   Copy of [place] Chart? No - copy requested No - copy requested    No - copy requested   Would patient like information on creating a medical advance directive?    No - Patient declined No - Patient declined      Current Medications (verified) Outpatient Encounter Medications as of 04/16/2022  Medication Sig   Cholecalciferol (VITAMIN D) 2000 units CAPS Take by mouth.   levothyroxine (SYNTHROID) 150 MCG tablet TAKE 1/2 TABLET BY MOUTH EVERY DAY EXCEPT 1 TAB ON MON, FRI.   losartan (COZAAR) 25 MG tablet Take 1 tablet (25 mg total) by mouth daily.   Multiple Vitamins-Minerals (CENTRUM SILVER PO) Take 1 tablet by mouth daily.   Multiple Vitamins-Minerals (HAIR/SKIN/NAILS/BIOTIN) TABS Take by mouth.   nabumetone (RELAFEN) 500 MG tablet Take 500 mg by mouth daily.   nystatin cream (MYCOSTATIN) Apply 1 application. topically as needed for dry skin.   Propylene Glycol (SYSTANE BALANCE OP) Apply 1 drop to eye 2 (two) times daily as needed (Dry eyes). Reported on 01/31/2016   rosuvastatin (CRESTOR) 40 MG tablet TAKE 1 TABLET BY MOUTH EVERY DAY   Triamcinolone Acetonide (TRIAMCINOLONE 0.1 % CREAM : EUCERIN) CREA Apply 1 application topically 2 (two) times daily.   vitamin B-12 (CYANOCOBALAMIN) 1000 MCG tablet Take 1,000 mcg by mouth every other day. Take one tablet every other day   cephALEXin (KEFLEX) 500 MG capsule Take 500  mg by mouth as needed (Before dental procedures). (Patient not taking: Reported on 04/16/2022)   No facility-administered encounter medications on file as of 04/16/2022.    Allergies (verified) Clindamycin/lincomycin, Codeine, Crestor [rosuvastatin calcium], Lipitor [atorvastatin calcium], Pravachol, Simvastatin, Ultram [tramadol hcl], Vioxx [rofecoxib], Feldene [piroxicam], Myrbetriq [mirabegron], Penicillins, Protonix [pantoprazole], and Toviaz  [fesoterodine fumarate er]   History: Past Medical History:  Diagnosis Date   At risk for sleep apnea    Bilateral edema of lower extremity    left > right --  wear compression hose   Cervical spondylosis    Chronic constipation    Cystitis [id number]'B   Dr Prince Rome - here    GERD (gastroesophageal reflux disease)    History of colon polyps    History of idiopathic seizure    AGE 35 to 24  X5  --  UNKNOW IDIOLOGY , PER PT WAS ANEMIC AT THE TIME--  NONE SINCE   History of tachycardia    S/P  RADIOACTIVE IODINE ABLATION OF THYROID   Hyperlipidemia    Hypertension    Hypothyroidism, postradioiodine therapy    AGE 30   IBS (irritable bowel syndrome)    Neuropathy    OA (osteoarthritis)    shoulders  left > right   PMB (postmenopausal bleeding)    S/P radioactive iodine thyroid ablation    Urge urinary incontinence    Varicose veins    Vein disorder    LEFT ANKLE VEIN VALVE REFLUX WITH DECREASED CIRCULATION    Past Surgical History:  Procedure Laterality Date   CATARACT EXTRACTION W/ INTRAOCULAR LENS  IMPLANT, BILATERAL  right 2011//  left 2013   [place]   CLOSED LEFT KNEE MANIPULATION AND RIGHT KNEE CORTISONE INJECTION  02-20-2010   COLONOSCOPY W/ POLYPECTOMY  10-25-2008   DILATION AND CURETTAGE OF UTERUS     ENDOVENOUS ABLATION SAPHENOUS VEIN W/ LASER  04/2007   EYE SURGERY Bilateral    cataract   EYE SURGERY Bilateral    eyelid lifts /// left eye - placed removed   HYSTEROSCOPY WITH D & C N/A 11/11/2014   Procedure: DILATATION AND CURETTAGE /HYSTEROSCOPY;  Surgeon: Gus Height, MD;  Location: [facility];  Service: Gynecology;  Laterality: N/A;   JOINT REPLACEMENT     LAPAROSCOPIC CHOLECYSTECTOMY  1993   and BILATERAL TUBAL LIGATION   LEFT KNEE ARTHROTOMY W/ LYSIS ADHESIONS  12-01-2010   MOUTH SURGERY     dental implants   TONSILLECTOMY  1955   TOTAL KNEE ARTHROPLASTY Left 12-19-2009   TOTAL KNEE ARTHROPLASTY Right 02/13/2016   Procedure:  RIGHT TOTAL KNEE ARTHROPLASTY;  Surgeon: Gaynelle Arabian, MD;  Location: [facility];  Service: Orthopedics;  Laterality: Right;   [place] to 2010   includes laser and phlebectomies   Family History  Problem Relation Age of Onset   COPD Mother        smoked   Parkinsonism Mother    Arthritis Mother    Epilepsy Mother        early in 75's   Myasthenia gravis Father    Asthma Father    Emphysema Father        smoked   Glaucoma Father    Arthritis Brother        knees   Heart disease Brother    Diabetes Brother    Hyperlipidemia Brother    Hyperlipidemia Brother    Hyperlipidemia Brother    Heart  disease Brother    Hyperlipidemia Brother    Kidney disease Maternal Uncle    CAD Paternal Grandmother 33   Epilepsy Daughter 49   Diabetes Daughter    Colon cancer Neg Hx    Esophageal cancer Neg Hx    Stomach cancer Neg Hx    Rectal cancer Neg Hx    Social History   Socioeconomic History   Marital status: Married    Spouse name: Ronalee Belts   Number of children: 1   Years of education: 16   Highest education level: Bachelor's degree (e.g., BA, AB, BS)  Occupational History   Occupation: Retired     Comment: Pharmacist, hospital  Tobacco Use   Smoking status: Never   Smokeless tobacco: Never  Vaping Use   Vaping Use: Never used  Substance and Sexual Activity   Alcohol use: No   Drug use: No   Sexual activity: Not Currently    Birth control/protection: None    Comment: not much  Other Topics Concern   Not on file  Social History Narrative   Lives with husband in a one story home. Back end of home has steps leading out to a deck. Grandson stays with them 5 days per week while his mother works   Her husband is very [place], had recent brain surgery, she's his caretaker   Right handed   Drinks a lot of caffiene   Social Determinants of Health   Financial Resource Strain: [place]  (04/16/2022)   Overall Financial Resource Strain (CARDIA)    Difficulty of  Paying Living Expenses: Not hard at all  Food Insecurity: No Food Insecurity (04/16/2022)   Hunger Vital Sign    Worried About Running Out of Food in the Last Year: Never true    [place] in the Last Year: Never true  Transportation Needs: No Transportation Needs (04/16/2022)   PRAPARE - Hydrologist (Medical): No    Lack of Transportation (Non-Medical): No  Physical Activity: Insufficiently Active (04/16/2022)   Exercise Vital Sign    Days of Exercise per Week: 7 days    Minutes of Exercise per Session: 20 min  Stress: Stress Concern Present (04/16/2022)   [place]    Feeling of Stress : To some extent  Social Connections: Moderately Isolated (04/16/2022)   Social Connection and Isolation Panel [NHANES]    Frequency of Communication with Friends and Family: More than three times a week    Frequency of Social Gatherings with Friends and Family: More than three times a week    Attends Religious Services: Never    Marine scientist or Organizations: No    Attends Music therapist: Never    Marital Status: Married    Tobacco Counseling Counseling given: Not Answered   Clinical Intake:  Pre-visit preparation completed: Yes  Pain : 0-10 Pain Score: 6  Pain Type: Chronic pain Pain Location: Generalized Pain Descriptors / Indicators: Sore, Tender, Discomfort, Aching Pain Onset: More than a month ago Pain Frequency: Intermittent     BMI - recorded: 35.07 Nutritional Status: BMI > 30  Obese Nutritional Risks: None Diabetes: No  How often do you need to have someone help you when you read instructions, pamphlets, or other written materials from your doctor or pharmacy?: 1 - Never  Diabetic? no  Interpreter Needed?: No  Information entered by :: Keifer Habib, LPN   Activities of Daily Living  04/16/2022    8:27 AM  In your present state of health, do  you have any difficulty performing the following activities:  Hearing? 0  Vision? 0  Difficulty concentrating or making decisions? 1  Walking or climbing stairs? 1  Dressing or bathing? 1  Comment trouble with buttons and zippers - poor dexterity lately  Doing errands, shopping? 0  Preparing Food and eating ? N  Using the Toilet? N  In the past six months, have you accidently leaked urine? Y  Comment has seen Urology several times  Do you have problems with loss of bowel control? N  Managing your Medications? N  Managing your Finances? N  Housekeeping or managing your Housekeeping? N    Patient Care Team: [name], DO as PCP - General (Family Medicine) Ulla Gallo, MD as Consulting Physician (Dermatology) Celestia Khat, [place] (Optometry) Gatha Mayer, MD as Consulting Physician (Gastroenterology)  Indicate any recent Medical Services you may have received from other than [facility] providers in the past year (date may be approximate).     Assessment:   This is a routine wellness examination for [place].  Hearing/Vision screen Hearing Screening - Comments:: Denies hearing difficulties   Vision Screening - Comments:: Wears rx glasses - up to date with routine eye exams with [facility]  Dietary issues and exercise activities discussed: Current Exercise Habits: Home exercise routine, Type of exercise: stretching, Time (Minutes): 20, Frequency (Times/Week): 7, Weekly Exercise (Minutes/Week): 140, Intensity: Mild, Exercise limited by: orthopedic condition(s);neurologic condition(s)   Goals Addressed               This Visit's Progress     Patient Stated (pt-stated)   On track     " I want to work on keeping my mind [place] by doing word puzzles"      Patient Stated   On track     04/16/2022 AWV Goal: Keep All Scheduled Appointments  Over the next year, patient will attend all scheduled appointments with their PCP and any specialists that they see.         Depression Screen    04/16/2022    8:32 AM 11/27/2021    4:10 PM 08/01/2021    3:09 PM 04/13/2021    9:32 AM 01/30/2021    2:32 PM 12/06/2020    3:25 PM 10/25/2020    3:44 PM  PHQ 2/9 Scores  PHQ - 2 Score 1 1 0 0 0 0 0  PHQ- 9 Score  7 1    0    Fall Risk    04/16/2022    8:10 AM 11/27/2021    4:10 PM 08/01/2021    3:09 PM 04/13/2021    9:33 AM 01/30/2021    2:32 PM  Fall Risk   Falls in the past year? '1 1 1 1 '$ 0  Number falls in past yr: 1 1 0 1   Injury with Fall? 0 0 0 0   Risk for fall due to : History of fall(s);Impaired balance/gait;Orthopedic patient History of fall(s) History of fall(s) History of fall(s);Impaired balance/gait;Impaired vision;Orthopedic patient   Follow up Education provided;Falls prevention discussed Education provided Education provided Education provided;Falls prevention discussed     FALL RISK PREVENTION PERTAINING TO THE HOME:  Any stairs in or around the home? Yes  If so, are there any without handrails? No  Home free of loose throw rugs in walkways, pet beds, electrical cords, etc? Yes  Adequate lighting in your home to reduce  risk of falls? Yes   ASSISTIVE DEVICES UTILIZED TO PREVENT FALLS:  Life alert? No  Use of a cane, walker or w/c? Yes  Grab bars in the bathroom? Yes  Shower chair or bench in shower? Yes  Elevated toilet seat or a handicapped toilet? Yes   TIMED UP AND GO:  Was the test performed? No . Telephonic visit  Cognitive Function:    01/04/2020   11:39 AM 09/30/2018   10:18 AM 12/20/2016   11:01 AM  MMSE - Mini Mental State Exam  Orientation to time '5 5 5  '$ Orientation to Place '5 5 5  '$ Registration '3 3 3  '$ Attention/ Calculation '5 5 5  '$ Recall '3 3 3  '$ Language- name 2 objects '2 2 2  '$ Language- repeat '1 1 1  '$ Language- follow 3 step command '3 3 3  '$ Language- read & follow direction '1 1 1  '$ Write a sentence '1 1 1  '$ Copy design '1 1 1  '$ Total score '30 30 30        '$ 04/16/2022    8:34 AM 04/12/2020    9:03 AM 04/08/2019    9:11 AM   6CIT Screen  What Year? 0 points 0 points 0 points  What month? 0 points 0 points 0 points  What time? 0 points 0 points 0 points  Count back from 20 0 points 0 points 0 points  Months in reverse 0 points 0 points 0 points  Repeat phrase 0 points 0 points 0 points  Total Score 0 points 0 points 0 points    Immunizations Immunization History  Administered Date(s) Administered   Fluad Quad(high Dose 65+) 08/01/2021   Influenza, High Dose Seasonal PF 07/18/2016, 08/15/2017, 09/21/2018   Influenza,inj,Quad PF,6+ Mos 08/18/2015   Influenza-Unspecified 08/21/2019   PFIZER(Purple Top)SARS-COV-2 Vaccination 03/19/2020, 04/09/2020, 10/15/2020, 05/13/2021   Pneumococcal Conjugate-13 02/03/2015   Pneumococcal Polysaccharide-23 11/23/2016   Td 11/12/2011   Tdap 11/12/2011    TDAP status: Due, Education has been provided regarding the importance of this vaccine. Advised may receive this vaccine at local pharmacy or Health Dept. Aware to provide a copy of the vaccination record if obtained from local pharmacy or Health Dept. Verbalized acceptance and understanding.  Flu Vaccine status: Up to date  Pneumococcal vaccine status: Up to date  Covid-19 vaccine status: Completed vaccines  Qualifies for Shingles Vaccine? Yes   Zostavax completed No   Shingrix Completed?: No.    Education has been provided regarding the importance of this vaccine. Patient has been advised to call insurance company to determine out of pocket expense if they have not yet received this vaccine. Advised may also receive vaccine at local pharmacy or Health Dept. Verbalized acceptance and understanding.  Screening Tests Health Maintenance  Topic Date Due   Zoster Vaccines- Shingrix (1 of 2) Never done   COVID-19 Vaccine (5 - Pfizer series) 07/08/2021   TETANUS/TDAP  11/11/2021   Hepatitis C Screening  08/01/2022 (Originally 04/12/1969)   INFLUENZA VACCINE  05/29/2022   MAMMOGRAM  06/28/2022   DEXA SCAN  08/16/2026    COLONOSCOPY (Pts 45-60yr Insurance coverage will need to be confirmed)  10/11/2026   Pneumonia Vaccine 71 Years old  Completed   HPV VACCINES  Aged Out    Health Maintenance  Health Maintenance Due  Topic Date Due   Zoster Vaccines- Shingrix (1 of 2) Never done   COVID-19 Vaccine (5 - Pfizer series) 07/08/2021   TETANUS/TDAP  11/11/2021    Colorectal cancer screening: Type  of screening: Colonoscopy. Completed 10/11/2016. Repeat every 10 years  Mammogram status: Completed 06/28/2021. Repeat every year  Bone Density status: Completed 08/16/2021. Results reflect: Bone density results: NORMAL. Repeat every 5 years.  Lung Cancer Screening: (Low Dose CT Chest recommended if Age 65-80 years, 30 pack-year currently smoking OR have quit w/in 15years.) does not qualify.   Additional Screening:  Hepatitis C Screening: does qualify; DUE  Vision Screening: Recommended annual ophthalmology exams for early detection of glaucoma and other disorders of the eye. Is the patient up to date with their annual eye exam?  Yes  Who is the provider or what is the name of the office in which the patient attends annual eye exams? [facility] If pt is not established with a provider, would they like to be referred to a provider to establish care? No .   Dental Screening: Recommended annual dental exams for proper oral hygiene  Community Resource Referral / Chronic Care Management: CRR required this visit?  No   CCM required this visit?  No      Plan:     I have personally reviewed and noted the following in the patient's chart:   Medical and social history Use of alcohol, tobacco or illicit drugs  Current medications and supplements including opioid prescriptions.  Functional ability and status Nutritional status Physical activity Advanced directives List of other physicians Hospitalizations, surgeries, and ER visits in previous 12 months Vitals Screenings to include cognitive,  depression, and falls Referrals and appointments  In addition, I have reviewed and discussed with patient certain preventive protocols, quality metrics, and best practice recommendations. A written personalized care plan for preventive services as well as general preventive health recommendations were provided to patient.     Sandrea Hammond, LPN   [id number]/[id number]   Nurse Notes: Still having terrible neuropathy, her ankles swell when standing for more than a few minutes - bumps and nodules on legs have improved - her balance is much worse lately, and she also doesn't have good grip strength and keeps dropping things and knocks things over, her dexterity is worse - cannot button or zip things well - flossing, sewing, writing, anything she tries to do leaves a red indention on her fingers. She had her esophagus stretched recently which seemed to help some with coughing and reflux. Her memory is worsening lately, even names of very close people and things she knows well, such as flowers and books. Recently has had increased pain in lower back radiating to right hip. It is difficult to stand up after sitting - feels as if her legs turn to cement. Urinary incontinence is worse and sometimes one pad isn't enough if she goes out somewhere.

## 2022-04-16 NOTE — Telephone Encounter (Signed)
Pt AWARE 

## 2022-04-16 NOTE — Telephone Encounter (Signed)
Doesn't have to hold anything BUT supplements containing biotin, as this can affect her thyroid labs.  Must be held for 2 weeks prior to lab draw

## 2022-05-17 ENCOUNTER — Ambulatory Visit: Payer: Medicare PPO | Admitting: Family Medicine

## 2022-05-17 ENCOUNTER — Encounter: Payer: Self-pay | Admitting: Family Medicine

## 2022-05-17 VITALS — BP 130/80 | HR 49 | Temp 97.8°F | Ht 63.0 in | Wt 196.1 lb

## 2022-05-17 DIAGNOSIS — U071 COVID-19: Secondary | ICD-10-CM

## 2022-05-17 MED ORDER — BENZONATATE 100 MG PO CAPS: 100.0000 mg | ORAL_CAPSULE | Freq: Three times a day (TID) | ORAL | 0 refills | Status: DC | PRN

## 2022-05-17 NOTE — Progress Notes (Signed)
Acute Office Visit  Subjective:     Patient ID: Miranda Wilson, female    DOB: [birth year], 71 y.o.   MRN: [id number]  Chief Complaint  Patient presents with   Covid Positive    HPI Patient is in today for positive Covid test. She took a home test yesterday and it was positive. She has family members who have tested positive for Covid. She reports a cough and congestion that started this morning. She denies fever, chills, shortness of breath, chest pain, myalgias, nausea, vomiting, or diarrhea. She has not taken anything for her symptoms. She would like to have a PCR test to be done to verify. She last had a booster about 1 year ago.   ROS As per HPI.      Objective:    BP 130/80 Comment: at home reading per pt  Pulse (!) 49   Temp 97.8 F (36.6 C) (Temporal)   Ht '5\' 3"'$  (1.6 m)   Wt 196 lb 2 oz (89 kg)   LMP 04/29/1991   SpO2 98%   BMI 34.74 kg/m    Physical Exam Vitals and nursing note reviewed.  Constitutional:      General: She is not in acute distress.    Appearance: She is not ill-appearing, toxic-appearing or diaphoretic.  HENT:     Head: Normocephalic and atraumatic.     Right Ear: Tympanic membrane, ear canal and external ear normal.     Left Ear: Tympanic membrane, ear canal and external ear normal.     Nose: Nose normal.     Mouth/Throat:     Mouth: Mucous membranes are moist.     Pharynx: Oropharynx is clear. No oropharyngeal exudate or posterior oropharyngeal erythema.  Eyes:     Extraocular Movements: Extraocular movements intact.     Conjunctiva/sclera: Conjunctivae normal.  Cardiovascular:     Rate and Rhythm: Normal rate and regular rhythm.     Heart sounds: Normal heart sounds. No murmur heard. Pulmonary:     Effort: Pulmonary effort is normal. No respiratory distress.     Breath sounds: Normal breath sounds.  Abdominal:     General: Bowel sounds are normal. There is no distension.     Palpations: Abdomen is soft.     Tenderness: There is  no abdominal tenderness. There is no guarding or rebound.  Musculoskeletal:     Right lower leg: No edema.     Left lower leg: No edema.  Skin:    General: Skin is warm and dry.  Neurological:     General: No focal deficit present.     Mental Status: She is alert and oriented to person, place, and time.     Gait: Gait abnormal (using cane).  Psychiatric:        Mood and Affect: Mood normal.        Behavior: Behavior normal.    No results found for any visits on 05/17/22.     Assessment & Plan:   Miranda Wilson was seen today for covid positive.  Diagnoses and all orders for this visit:  COVID-19 PCR obtained and sent per patient request. Discussed molnupiriavor as she is a high risk patient. She is interested in this if the PCR test is positive. Discussed isolation. Discussed symptomatic care and return precautions. Will notify patient of test results when available.  -     benzonatate (TESSALON PERLES) 100 MG capsule; Take 1 capsule (100 mg total) by mouth 3 (three) times daily as needed  for cough.  Return if symptoms worsen or fail to improve.  The patient indicates understanding of these issues and agrees with the plan.  Gwenlyn Perking, FNP

## 2022-05-17 NOTE — Patient Instructions (Signed)

## 2022-05-17 NOTE — Addendum Note (Signed)
Addended by: Milas Hock on: 05/17/2022 12:55 PM   Modules accepted: Orders

## 2022-05-18 ENCOUNTER — Other Ambulatory Visit: Payer: Self-pay | Admitting: Family Medicine

## 2022-05-18 DIAGNOSIS — U071 COVID-19: Secondary | ICD-10-CM

## 2022-05-18 LAB — NOVEL CORONAVIRUS, NAA: SARS-CoV-2, NAA: DETECTED — AB

## 2022-05-18 MED ORDER — MOLNUPIRAVIR EUA 200MG CAPSULE: 4.0000 | ORAL_CAPSULE | Freq: Two times a day (BID) | ORAL | 0 refills | Status: AC

## 2022-05-25 ENCOUNTER — Ambulatory Visit (INDEPENDENT_AMBULATORY_CARE_PROVIDER_SITE_OTHER): Payer: Medicare PPO | Admitting: Family Medicine

## 2022-05-25 ENCOUNTER — Encounter: Payer: Self-pay | Admitting: Family Medicine

## 2022-05-25 ENCOUNTER — Ambulatory Visit (INDEPENDENT_AMBULATORY_CARE_PROVIDER_SITE_OTHER): Payer: Medicare PPO

## 2022-05-25 VITALS — BP 135/79 | HR 52 | Temp 97.5°F | Ht 63.0 in | Wt 191.6 lb

## 2022-05-25 DIAGNOSIS — Z8616 Personal history of COVID-19: Secondary | ICD-10-CM | POA: Diagnosis not present

## 2022-05-25 DIAGNOSIS — E7800 Pure hypercholesterolemia, unspecified: Secondary | ICD-10-CM

## 2022-05-25 DIAGNOSIS — M545 Low back pain, unspecified: Secondary | ICD-10-CM | POA: Diagnosis not present

## 2022-05-25 DIAGNOSIS — I10 Essential (primary) hypertension: Secondary | ICD-10-CM | POA: Diagnosis not present

## 2022-05-25 DIAGNOSIS — E8881 Metabolic syndrome: Secondary | ICD-10-CM

## 2022-05-25 DIAGNOSIS — R739 Hyperglycemia, unspecified: Secondary | ICD-10-CM | POA: Diagnosis not present

## 2022-05-25 DIAGNOSIS — E890 Postprocedural hypothyroidism: Secondary | ICD-10-CM | POA: Diagnosis not present

## 2022-05-25 DIAGNOSIS — Z0001 Encounter for general adult medical examination with abnormal findings: Secondary | ICD-10-CM

## 2022-05-25 DIAGNOSIS — Z0000 Encounter for general adult medical examination without abnormal findings: Secondary | ICD-10-CM

## 2022-05-25 DIAGNOSIS — M5441 Lumbago with sciatica, right side: Secondary | ICD-10-CM | POA: Diagnosis not present

## 2022-05-25 LAB — BAYER DCA HB A1C WAIVED: HB A1C (BAYER DCA - WAIVED): 5.6 % (ref 4.8–5.6)

## 2022-05-25 MED ORDER — LOSARTAN POTASSIUM 25 MG PO TABS
25.0000 mg | ORAL_TABLET | Freq: Every day | ORAL | 3 refills | Status: DC
Start: 2022-05-25 — End: 2023-06-12

## 2022-05-25 MED ORDER — ROSUVASTATIN CALCIUM 40 MG PO TABS
40.0000 mg | ORAL_TABLET | Freq: Every day | ORAL | 3 refills | Status: DC
Start: 2022-05-25 — End: 2023-08-05

## 2022-05-25 NOTE — Progress Notes (Signed)
Miranda Wilson is a 71 y.o. female presents to office today for annual physical exam examination.    Concerns today include: 1.  Right low back pain Patient reports that she has been having some increased pain since June in the right low back radiating to her hip and sometimes down her right lower extremity.  She denies any preceding injury.  No sensory changes outside of her baseline reported.  She has been using Tylenol and trying to rest but symptoms are persistent and affecting her ability to ambulate independently.  Occupation: retired  Diet: balanced, Exercise: no structured Last eye exam: UTD Last dental exam: UTD Last colonoscopy: UTD Last mammogram: Due Last pap smear: n/a Refills needed today: none Immunizations needed: Immunization History  Administered Date(s) Administered   Fluad Quad(high Dose 65+) 08/01/2021   Influenza, High Dose Seasonal PF 07/18/2016, 08/15/2017, 09/21/2018   Influenza,inj,Quad PF,6+ Mos 08/18/2015   Influenza-Unspecified 08/21/2019   PFIZER(Purple Top)SARS-COV-2 Vaccination 03/19/2020, 04/09/2020, 10/15/2020, 05/13/2021   Pneumococcal Conjugate-13 02/03/2015   Pneumococcal Polysaccharide-23 11/23/2016   Td 11/12/2011   Tdap 11/12/2011     Past Medical History:  Diagnosis Date   At risk for sleep apnea    Bilateral edema of lower extremity    left > right --  wear compression hose   Cervical spondylosis    Chronic constipation    Cystitis [id number]'[id number]   Dr Prince Rome - here    GERD (gastroesophageal reflux disease)    History of colon polyps    History of idiopathic seizure    AGE 71 to 22  X5  --  UNKNOW IDIOLOGY , PER PT WAS ANEMIC AT THE TIME--  NONE SINCE   History of tachycardia    S/P  RADIOACTIVE IODINE ABLATION OF THYROID   Hyperlipidemia    Hypertension    Hypothyroidism, postradioiodine therapy    AGE 69   IBS (irritable bowel syndrome)    Neuropathy    OA (osteoarthritis)    shoulders  left > right   PMB (postmenopausal  bleeding)    S/P radioactive iodine thyroid ablation    Urge urinary incontinence    Varicose veins    Vein disorder    LEFT ANKLE VEIN VALVE REFLUX WITH DECREASED CIRCULATION    Social History   Socioeconomic History   Marital status: Married    Spouse name: Ronalee Belts   Number of children: 1   Years of education: 16   Highest education level: Bachelor's degree (e.g., BA, AB, BS)  Occupational History   Occupation: Retired     Comment: Pharmacist, hospital  Tobacco Use   Smoking status: Never   Smokeless tobacco: Never  Vaping Use   Vaping Use: Never used  Substance and Sexual Activity   Alcohol use: No   Drug use: No   Sexual activity: Not Currently    Birth control/protection: None    Comment: not much  Other Topics Concern   Not on file  Social History Narrative   Lives with husband in a one story home. Back end of home has steps leading out to a deck. Grandson stays with them 5 days per week while his mother works   Her husband is very [place], had recent brain surgery, she's his caretaker   Right handed   Drinks a lot of caffiene   Social Determinants of Health   Financial Resource Strain: Low Risk  (04/16/2022)   Overall Financial Resource Strain (CARDIA)    Difficulty of Paying Living Expenses: Not hard  at all  Food Insecurity: No Food Insecurity (04/16/2022)   Hunger Vital Sign    Worried About Running Out of Food in the Last Year: Never true    Ran Out of Food in the Last Year: Never true  Transportation Needs: No Transportation Needs (04/16/2022)   PRAPARE - Transportation    Lack of Transportation (Medical): No    Lack of Transportation (Non-Medical): No  Physical Activity: Insufficiently Active (04/16/2022)   Exercise Vital Sign    Days of Exercise per Week: 7 days    Minutes of Exercise per Session: 20 min  Stress: Stress Concern Present (04/16/2022)   [employer] - Occupational Stress Questionnaire    Feeling of Stress : To some extent  Social  Connections: Moderately Isolated (04/16/2022)   Social Connection and Isolation Panel [NHANES]    Frequency of Communication with Friends and Family: More than three times a week    Frequency of Social Gatherings with Friends and Family: More than three times a week    Attends Religious Services: Never    Active Member of Clubs or Organizations: No    Attends Club or Organization Meetings: Never    Marital Status: Married  Intimate Partner Violence: Not At Risk (04/16/2022)   Humiliation, Afraid, Rape, and Kick questionnaire    Fear of Current or Ex-Partner: No    Emotionally Abused: No    Physically Abused: No    Sexually Abused: No   Past Surgical History:  Procedure Laterality Date   CATARACT EXTRACTION W/ INTRAOCULAR LENS  IMPLANT, BILATERAL  right 2011//  left 2013   CESAREAN SECTION  1979   CLOSED LEFT KNEE MANIPULATION AND RIGHT KNEE CORTISONE INJECTION  02-20-2010   COLONOSCOPY W/ POLYPECTOMY  10-25-2008   DILATION AND CURETTAGE OF UTERUS     ENDOVENOUS ABLATION SAPHENOUS VEIN W/ LASER  04/2007   EYE SURGERY Bilateral    cataract   EYE SURGERY Bilateral    eyelid lifts /// left eye - placed removed   HYSTEROSCOPY WITH D & C N/A 11/11/2014   Procedure: DILATATION AND CURETTAGE /HYSTEROSCOPY;  Surgeon: Allan Ross, MD;  Location: [facility];  Service: Gynecology;  Laterality: N/A;   JOINT REPLACEMENT     LAPAROSCOPIC CHOLECYSTECTOMY  1993   and BILATERAL TUBAL LIGATION   LEFT KNEE ARTHROTOMY W/ LYSIS ADHESIONS  12-01-2010   MOUTH SURGERY     dental implants   TONSILLECTOMY  1955   TOTAL KNEE ARTHROPLASTY Left 12-19-2009   TOTAL KNEE ARTHROPLASTY Right 02/13/2016   Procedure: RIGHT TOTAL KNEE ARTHROPLASTY;  Surgeon: Frank Aluisio, MD;  Location: [facility];  Service: Orthopedics;  Laterality: Right;   TUBAL LIGATION     VARICOSE VEIN SURGERY  1998 to 2010   includes laser and phlebectomies   Family History  Problem Relation Age of Onset   COPD Mother         smoked   Parkinsonism Mother    Arthritis Mother    Epilepsy Mother        early in 20's   Myasthenia gravis Father    Asthma Father    Emphysema Father        smoked   Glaucoma Father    Arthritis Brother        knees   Heart disease Brother    Diabetes Brother    Hyperlipidemia Brother    Hyperlipidemia Brother    Hyperlipidemia Brother    Heart disease Brother      Hyperlipidemia Brother    Kidney disease Maternal Uncle    CAD Paternal Grandmother 60   Epilepsy Daughter 13   Diabetes Daughter    Colon cancer Neg Hx    Esophageal cancer Neg Hx    Stomach cancer Neg Hx    Rectal cancer Neg Hx     Current Outpatient Medications:    Cholecalciferol (VITAMIN D) 2000 units CAPS, Take by mouth., Disp: , Rfl:    levothyroxine (SYNTHROID) 150 MCG tablet, TAKE 1/2 TABLET BY MOUTH EVERY DAY EXCEPT 1 TAB ON MON, FRI., Disp: 54 tablet, Rfl: 2   losartan (COZAAR) 25 MG tablet, Take 1 tablet (25 mg total) by mouth daily., Disp: 90 tablet, Rfl: 3   Multiple Vitamins-Minerals (CENTRUM SILVER PO), Take 1 tablet by mouth daily., Disp: , Rfl:    Multiple Vitamins-Minerals (HAIR/SKIN/NAILS/BIOTIN) TABS, Take by mouth., Disp: , Rfl:    nabumetone (RELAFEN) 500 MG tablet, Take 500 mg by mouth daily., Disp: , Rfl:    nystatin cream (MYCOSTATIN), Apply 1 application. topically as needed for dry skin., Disp: , Rfl:    Propylene Glycol (SYSTANE BALANCE OP), Apply 1 drop to eye 2 (two) times daily as needed (Dry eyes). Reported on 01/31/2016, Disp: , Rfl:    rosuvastatin (CRESTOR) 40 MG tablet, TAKE 1 TABLET BY MOUTH EVERY DAY, Disp: 90 tablet, Rfl: 0   Triamcinolone Acetonide (TRIAMCINOLONE 0.1 % CREAM : EUCERIN) CREA, Apply 1 application topically 2 (two) times daily., Disp: , Rfl:    vitamin B-12 (CYANOCOBALAMIN) 1000 MCG tablet, Take 1,000 mcg by mouth every other day. Take one tablet every other day, Disp: , Rfl:    cephALEXin (KEFLEX) 500 MG capsule, Take 500 mg by mouth as needed (Before dental  procedures). (Patient not taking: Reported on 04/16/2022), Disp: , Rfl:   Allergies  Allergen Reactions   Clindamycin/Lincomycin Other (See Comments)    Dizzy spells   Codeine Nausea And Vomiting   Crestor [Rosuvastatin Calcium] Other (See Comments)    Aching flu like large dose    Lipitor [Atorvastatin Calcium] Other (See Comments)    Numbness in feet    Pravachol Other (See Comments)    Back pain     Simvastatin Other (See Comments)    Back pain     Ultram [Tramadol Hcl] Nausea And Vomiting   Vioxx [Rofecoxib] Other (See Comments)    Fluid rentention , elevated blood pressure    Feldene [Piroxicam] Rash   Myrbetriq [Mirabegron] Other (See Comments)    Constipation, headache   Penicillins Rash    Pt states she CAN take Keflex   Protonix [Pantoprazole] Rash   Toviaz [Fesoterodine Fumarate Er] Other (See Comments)    Dry skin, dry eyes, constipation     ROS: Review of Systems A comprehensive review of systems was negative except for: Ears, nose, mouth, throat, and face: positive for eyeglasses Musculoskeletal: positive for back pain Neurological: positive for gait problems and paresthesia    Physical exam BP 135/79   Pulse (!) 52   Temp (!) 97.5 F (36.4 C)   Ht 5' 3" (1.6 m)   Wt 191 lb 9.6 oz (86.9 kg)   LMP 04/29/1991   SpO2 99%   BMI 33.94 kg/m  General appearance: alert, cooperative, appears stated age, and no distress Head: Normocephalic, without obvious abnormality, atraumatic Eyes: negative findings: lids and lashes normal, conjunctivae and sclerae normal, corneas clear, and pupils equal, round, reactive to light and accomodation Ears: normal TM's and external ear canals   both ears Nose: Nares normal. Septum midline. Mucosa normal. No drainage or sinus tenderness. Throat: lips, mucosa, and tongue normal; teeth and gums normal Neck: no adenopathy, no carotid bruit, supple, symmetrical, trachea midline, and thyroid without masses Back:  Lumbar spine without  midline tenderness palpation but she has quite a bit of tenderness palpation over the lumbosacral and sacroiliac joint on the right.  No tenderness palpation over the trochanteric bursa Lungs: clear to auscultation bilaterally Heart: regular rate and rhythm, S1, S2 normal, no murmur, click, rub or gallop Abdomen: soft, non-tender; bowel sounds normal; no masses,  no organomegaly Extremities: extremities normal, atraumatic, no cyanosis or edema Pulses: 2+ and symmetric Skin:  Mild nodularity of the lower extremities Lymph nodes: Cervical, supraclavicular, and axillary nodes normal. Neurologic: Ambulates with use of cane.  Has sensory issues in the periphery    05/25/2022    1:05 PM 05/17/2022    1:17 PM 04/16/2022    8:32 AM  Depression screen PHQ 2/9  Decreased Interest 0 0 0  Down, Depressed, Hopeless 0 0 1  PHQ - 2 Score 0 0 1  Altered sleeping 1 3   Tired, decreased energy 1 1   Change in appetite 1 0   Feeling bad or failure about yourself  0 0   Trouble concentrating 1 0   Moving slowly or fidgety/restless 3 0   Suicidal thoughts  0   PHQ-9 Score 7 4   Difficult doing work/chores Somewhat difficult Somewhat difficult       05/25/2022    1:05 PM 05/17/2022    1:17 PM 11/27/2021    4:11 PM 08/01/2021    3:09 PM  GAD 7 : Generalized Anxiety Score  Nervous, Anxious, on Edge 1 1 0 1  Control/stop worrying 0 0 0 1  Worry too much - different things 0 0 0 0  Trouble relaxing 1 0 0 0  Restless 0 0 0 0  Easily annoyed or irritable 0 0 0 0  Afraid - awful might happen 0 1 0 1  Total GAD 7 Score 2 2 0 3  Anxiety Difficulty Not difficult at all Not difficult at all Not difficult at all Somewhat difficult    Assessment/ Plan: [name] here for annual physical exam.   Annual physical exam  Personal history of COVID-19 - Plan: CBC  Postablative hypothyroidism - Plan: TSH, T4, Free  Primary hypertension - Plan: CMP14+EGFR, losartan (COZAAR) 25 MG tablet  Metabolic  syndrome - Plan: Bayer DCA Hb A1c Waived  Pure hypercholesterolemia - Plan: CMP14+EGFR, Lipid Panel, rosuvastatin (CRESTOR) 40 MG tablet  Acute right-sided low back pain with right-sided sciatica - Plan: DG Lumbar Spine 2-3 Views  Recent history of COVID-19, actively recovering but still mildly symptomatic with drainage.  Declined any nasal sprays etc.  Encouraged use of honey for cough suppressant.  Check CBC  Check TSH, free T4.  Blood pressure well controlled.  No changes.  Check A1c, CMP and lipid panel.  Continue statin  Plain films of the lumbar spine ordered.  Further work-up pending results.  May refer to physical therapy versus refer to orthopedics for intervention depending on severity of I suspect to be degenerative changes in her lumbar spine.  Has known degenerative disc disease in the cervical spine.  Jasin Brazel M. Lajuana Ripple, DO

## 2022-05-25 NOTE — Patient Instructions (Signed)
Honey for cough  Preventive Care 65 Years and Older, Female Preventive care refers to lifestyle choices and visits with your health care provider that can promote health and wellness. Preventive care visits are also called wellness exams. What can I expect for my preventive care visit? Counseling Your health care provider may ask you questions about your: Medical history, including: Past medical problems. Family medical history. Pregnancy and menstrual history. History of falls. Current health, including: Memory and ability to understand (cognition). Emotional well-being. Home life and relationship well-being. Sexual activity and sexual health. Lifestyle, including: Alcohol, nicotine or tobacco, and drug use. Access to firearms. Diet, exercise, and sleep habits. Work and work Statistician. Sunscreen use. Safety issues such as seatbelt and bike helmet use. Physical exam Your health care provider will check your: Height and weight. These may be used to calculate your BMI (body mass index). BMI is a measurement that tells if you are at a healthy weight. Waist circumference. This measures the distance around your waistline. This measurement also tells if you are at a healthy weight and may help predict your risk of certain diseases, such as type 2 diabetes and high blood pressure. Heart rate and blood pressure. Body temperature. Skin for abnormal spots. What immunizations do I need?  Vaccines are usually given at various ages, according to a schedule. Your health care provider will recommend vaccines for you based on your age, medical history, and lifestyle or other factors, such as travel or where you work. What tests do I need? Screening Your health care provider may recommend screening tests for certain conditions. This may include: Lipid and cholesterol levels. Hepatitis C test. Hepatitis B test. HIV (human immunodeficiency virus) test. STI (sexually transmitted infection)  testing, if you are at risk. Lung cancer screening. Colorectal cancer screening. Diabetes screening. This is done by checking your blood sugar (glucose) after you have not eaten for a while (fasting). Mammogram. Talk with your health care provider about how often you should have regular mammograms. BRCA-related cancer screening. This may be done if you have a family history of breast, ovarian, tubal, or peritoneal cancers. Bone density scan. This is done to screen for osteoporosis. Talk with your health care provider about your test results, treatment options, and if necessary, the need for more tests. Follow these instructions at home: Eating and drinking  Eat a diet that includes fresh fruits and vegetables, whole grains, lean protein, and low-fat dairy products. Limit your intake of foods with high amounts of sugar, saturated fats, and salt. Take vitamin and mineral supplements as recommended by your health care provider. Do not drink alcohol if your health care provider tells you not to drink. If you drink alcohol: Limit how much you have to 0-1 drink a day. Know how much alcohol is in your drink. In the [place] one drink equals one 12 oz bottle of beer (355 mL), one 5 oz glass of wine (148 mL), or one 1 oz glass of hard liquor (44 mL). Lifestyle Brush your teeth every morning and night with fluoride toothpaste. Floss one time each day. Exercise for at least 30 minutes 5 or more days each week. Do not use any products that contain nicotine or tobacco. These products include cigarettes, chewing tobacco, and vaping devices, such as e-cigarettes. If you need help quitting, ask your health care provider. Do not use drugs. If you are sexually active, practice safe sex. Use a condom or other form of protection in order to prevent STIs. Take aspirin  only as told by your health care provider. Make sure that you understand how much to take and what form to take. Work with your health care provider  to find out whether it is safe and beneficial for you to take aspirin daily. Ask your health care provider if you need to take a cholesterol-lowering medicine (statin). Find healthy ways to manage stress, such as: Meditation, yoga, or listening to music. Journaling. Talking to a trusted person. Spending time with friends and family. Minimize exposure to UV radiation to reduce your risk of skin cancer. Safety Always wear your seat belt while driving or riding in a vehicle. Do not drive: If you have been drinking alcohol. Do not ride with someone who has been drinking. When you are tired or distracted. While texting. If you have been using any mind-altering substances or drugs. Wear a helmet and other protective equipment during sports activities. If you have firearms in your house, make sure you follow all gun safety procedures. What's next? Visit your health care provider once a year for an annual wellness visit. Ask your health care provider how often you should have your eyes and teeth checked. Stay up to date on all vaccines. This information is not intended to replace advice given to you by your health care provider. Make sure you discuss any questions you have with your health care provider. Document Revised: 04/12/2021 Document Reviewed: 04/12/2021 Elsevier Patient Education  [place].

## 2022-05-26 LAB — CMP14+EGFR
ALT: 19 IU/L (ref 0–32)
AST: 33 IU/L (ref 0–40)
Albumin/Globulin Ratio: 2 (ref 1.2–2.2)
Albumin: 4.2 g/dL (ref 3.8–4.8)
Alkaline Phosphatase: 100 IU/L (ref 44–121)
BUN/Creatinine Ratio: 16 (ref 12–28)
BUN: 15 mg/dL (ref 8–27)
Bilirubin Total: 0.5 mg/dL (ref 0.0–1.2)
CO2: 23 mmol/L (ref 20–29)
Calcium: 9.2 mg/dL (ref 8.7–10.3)
Chloride: 104 mmol/L (ref 96–106)
Creatinine, Ser: 0.94 mg/dL (ref 0.57–1.00)
Globulin, Total: 2.1 g/dL (ref 1.5–4.5)
Glucose: 82 mg/dL (ref 70–99)
Potassium: 4.4 mmol/L (ref 3.5–5.2)
Sodium: 140 mmol/L (ref 134–144)
Total Protein: 6.3 g/dL (ref 6.0–8.5)
eGFR: 64 mL/min/{1.73_m2} (ref >59)

## 2022-05-26 LAB — CBC
Hematocrit: 39.5 % (ref 34.0–46.6)
Hemoglobin: 12.8 g/dL (ref 11.1–15.9)
MCH: 27.5 pg (ref 26.6–33.0)
MCHC: 32.4 g/dL (ref 31.5–35.7)
MCV: 85 fL (ref 79–97)
Platelets: 180 10*3/uL (ref 150–450)
RBC: 4.66 x10E6/uL (ref 3.77–5.28)
RDW: 14.7 % (ref 11.7–15.4)
WBC: 4.1 10*3/uL (ref 3.4–10.8)

## 2022-05-26 LAB — LIPID PANEL
Chol/HDL Ratio: 3.4 ratio (ref 0.0–4.4)
Cholesterol, Total: 171 mg/dL (ref 100–199)
HDL: 50 mg/dL (ref >39)
LDL Chol Calc (NIH): 103 mg/dL — ABNORMAL HIGH (ref 0–99)
Triglycerides: 97 mg/dL (ref 0–149)
VLDL Cholesterol Cal: 18 mg/dL (ref 5–40)

## 2022-05-26 LAB — T4, FREE: Free T4: 1.72 ng/dL (ref 0.82–1.77)

## 2022-05-26 LAB — TSH: TSH: 0.169 u[IU]/mL — ABNORMAL LOW (ref 0.450–4.500)

## 2022-07-03 ENCOUNTER — Other Ambulatory Visit: Payer: Self-pay | Admitting: Family Medicine

## 2022-07-03 DIAGNOSIS — Z1231 Encounter for screening mammogram for malignant neoplasm of breast: Secondary | ICD-10-CM

## 2022-07-04 ENCOUNTER — Ambulatory Visit
Admission: RE | Admit: 2022-07-04 | Discharge: 2022-07-04 | Disposition: A | Discharge status: Home / Self Care | Payer: Medicare PPO | Source: Ambulatory Visit | Attending: Family Medicine | Admitting: Family Medicine

## 2022-07-04 DIAGNOSIS — Z1231 Encounter for screening mammogram for malignant neoplasm of breast: Secondary | ICD-10-CM | POA: Diagnosis not present

## 2022-08-06 DIAGNOSIS — Z961 Presence of intraocular lens: Secondary | ICD-10-CM | POA: Diagnosis not present

## 2022-08-06 DIAGNOSIS — H52223 Regular astigmatism, bilateral: Secondary | ICD-10-CM | POA: Diagnosis not present

## 2022-08-06 DIAGNOSIS — H524 Presbyopia: Secondary | ICD-10-CM | POA: Diagnosis not present

## 2022-08-06 DIAGNOSIS — H04123 Dry eye syndrome of bilateral lacrimal glands: Secondary | ICD-10-CM | POA: Diagnosis not present

## 2022-08-06 DIAGNOSIS — H43812 Vitreous degeneration, left eye: Secondary | ICD-10-CM | POA: Diagnosis not present

## 2022-10-28 ENCOUNTER — Other Ambulatory Visit: Payer: Self-pay | Admitting: Family Medicine

## 2022-10-28 DIAGNOSIS — E890 Postprocedural hypothyroidism: Secondary | ICD-10-CM

## 2022-10-30 NOTE — Telephone Encounter (Signed)
See if she can pop in for a TSH/ FT4.  If so, please order and schedule a lab visit.

## 2022-11-07 ENCOUNTER — Encounter: Payer: Self-pay | Admitting: Family Medicine

## 2022-11-07 ENCOUNTER — Telehealth (INDEPENDENT_AMBULATORY_CARE_PROVIDER_SITE_OTHER): Payer: Medicare PPO | Admitting: Family Medicine

## 2022-11-07 DIAGNOSIS — J069 Acute upper respiratory infection, unspecified: Secondary | ICD-10-CM

## 2022-11-07 DIAGNOSIS — H1032 Unspecified acute conjunctivitis, left eye: Secondary | ICD-10-CM | POA: Diagnosis not present

## 2022-11-07 MED ORDER — DOXYCYCLINE HYCLATE 100 MG PO TABS: 100.0000 mg | ORAL_TABLET | Freq: Two times a day (BID) | ORAL | 0 refills | Status: AC

## 2022-11-07 MED ORDER — POLYMYXIN B-TRIMETHOPRIM 10000-0.1 UNIT/ML-% OP SOLN: 1.0000 [drp] | Freq: Four times a day (QID) | OPHTHALMIC | 0 refills | Status: AC

## 2022-11-07 NOTE — Addendum Note (Signed)
Addended by: Antonietta Barcelona D on: 11/07/2022 09:05 AM   Modules accepted: Orders

## 2022-11-07 NOTE — Progress Notes (Signed)
Virtual Visit via telephone Note Due to COVID-19 pandemic this visit was conducted virtually. This visit type was conducted due to national recommendations for restrictions regarding the COVID-19 Pandemic (e.g. social distancing, sheltering in place) in an effort to limit this patient's exposure and mitigate transmission in our community. All issues noted in this document were discussed and addressed.  A physical exam was not performed with this format.   I connected with Miranda Wilson on 11/07/2022 at 1100 by telephone and verified that I am speaking with the correct person using two identifiers. Miranda Wilson is currently located at home and patient is currently with them during visit. The provider, Monia Pouch, FNP is located in their office at time of visit.  I discussed the limitations, risks, security and privacy concerns of performing an evaluation and management service by virtual visit and the availability of in person appointments. I also discussed with the patient that there may be a patient responsible charge related to this service. The patient expressed understanding and agreed to proceed.  Subjective:  Patient ID: Miranda Wilson, female    DOB: 06-28-1951, 72 y.o.   MRN: [id number]  Chief Complaint:  Eye Problem and URI   HPI: Miranda Wilson is a 72 y.o. female presenting on 11/07/2022 for Eye Problem and URI   Pt reports ongoing URI symptoms since 10/22/2022. States symptoms seem to be worsening and not improving. States she now has purulent sputum production.  She developed redness and irritation to her left eye 4 days ago. Has been using warm compresses and lubricating eye drops without relief of symptoms. Denies injuries.   Eye Problem  The left eye is affected. This is a new problem. The current episode started in the past 7 days. The problem occurs constantly. The problem has been gradually worsening. There was no injury mechanism. She Does not wear contacts.  Associated symptoms include blurred vision, an eye discharge, eye redness, itching and a recent URI. Pertinent negatives include no double vision, fever, foreign body sensation, nausea, photophobia, vomiting or weakness. She has tried water and eye drops for the symptoms. The treatment provided no relief.  URI  This is a new problem. Episode onset: 10/22/2022. The problem has been gradually worsening. Associated symptoms include congestion, coughing, a plugged ear sensation, rhinorrhea and a sore throat. Pertinent negatives include no abdominal pain, chest pain, diarrhea, dysuria, ear pain, headaches, joint pain, joint swelling, nausea, neck pain, rash, sinus pain, sneezing, swollen glands, vomiting or wheezing. She has tried nothing for the symptoms. The treatment provided no relief.     Relevant past medical, surgical, family, and social history reviewed and updated as indicated.  Allergies and medications reviewed and updated.   Past Medical History:  Diagnosis Date   At risk for sleep apnea    Bilateral edema of lower extremity    left > right --  wear compression hose   Cervical spondylosis    Chronic constipation    Cystitis [id number]'[id number]   Dr Prince Rome - here    GERD (gastroesophageal reflux disease)    History of colon polyps    History of idiopathic seizure    AGE 60 to 43  X5  --  UNKNOW IDIOLOGY , PER PT WAS ANEMIC AT THE TIME--  NONE SINCE   History of tachycardia    S/P  RADIOACTIVE IODINE ABLATION OF THYROID   Hyperlipidemia    Hypertension    Hypothyroidism, postradioiodine therapy    AGE 35  IBS (irritable bowel syndrome)    Neuropathy    OA (osteoarthritis)    shoulders  left > right   PMB (postmenopausal bleeding)    S/P radioactive iodine thyroid ablation    Urge urinary incontinence    Varicose veins    Vein disorder    LEFT ANKLE VEIN VALVE REFLUX WITH DECREASED CIRCULATION     Past Surgical History:  Procedure Laterality Date   CATARACT EXTRACTION W/  INTRAOCULAR LENS  IMPLANT, BILATERAL  right 2011//  left 2013   [place]   CLOSED LEFT KNEE MANIPULATION AND RIGHT KNEE CORTISONE INJECTION  02-20-2010   COLONOSCOPY W/ POLYPECTOMY  10-25-2008   DILATION AND CURETTAGE OF UTERUS     ENDOVENOUS ABLATION SAPHENOUS VEIN W/ LASER  04/2007   EYE SURGERY Bilateral    cataract   EYE SURGERY Bilateral    eyelid lifts /// left eye - placed removed   HYSTEROSCOPY WITH D & C N/A 11/11/2014   Procedure: DILATATION AND CURETTAGE /HYSTEROSCOPY;  Surgeon: Gus Height, MD;  Location: [place];  Service: Gynecology;  Laterality: N/A;   JOINT REPLACEMENT     LAPAROSCOPIC CHOLECYSTECTOMY  1993   and BILATERAL TUBAL LIGATION   LEFT KNEE ARTHROTOMY W/ LYSIS ADHESIONS  12-01-2010   MOUTH SURGERY     dental implants   TONSILLECTOMY  1955   TOTAL KNEE ARTHROPLASTY Left 12-19-2009   TOTAL KNEE ARTHROPLASTY Right 02/13/2016   Procedure: RIGHT TOTAL KNEE ARTHROPLASTY;  Surgeon: Gaynelle Arabian, MD;  Location: [facility];  Service: Orthopedics;  Laterality: Right;   TUBAL LIGATION     VARICOSE VEIN SURGERY  1998 to 2010   includes laser and phlebectomies    Social History   Socioeconomic History   Marital status: Married    Spouse name: Ronalee Belts   Number of children: 1   Years of education: 16   Highest education level: Bachelor's degree (e.g., BA, AB, BS)  Occupational History   Occupation: Retired     Comment: Pharmacist, hospital  Tobacco Use   Smoking status: Never   Smokeless tobacco: Never  Vaping Use   Vaping Use: Never used  Substance and Sexual Activity   Alcohol use: No   Drug use: No   Sexual activity: Not Currently    Birth control/protection: None    Comment: not much  Other Topics Concern   Not on file  Social History Narrative   Lives with husband in a one story home. Back end of home has steps leading out to a deck. Grandson stays with them 5 days per week while his mother works   Her husband is very [place], had recent brain  surgery, she's his caretaker   Right handed   Drinks a lot of caffiene   Social Determinants of Health   Financial Resource Strain: [place]  (04/16/2022)   Overall Financial Resource Strain (CARDIA)    Difficulty of Paying Living Expenses: Not hard at all  Food Insecurity: No Food Insecurity (04/16/2022)   Hunger Vital Sign    Worried About Running Out of Food in the Last Year: Never true    [place] in the Last Year: Never true  Transportation Needs: No Transportation Needs (04/16/2022)   PRAPARE - Hydrologist (Medical): No    Lack of Transportation (Non-Medical): No  Physical Activity: Insufficiently Active (04/16/2022)   Exercise Vital Sign    Days of Exercise per Week: 7 days  Minutes of Exercise per Session: 20 min  Stress: Stress Concern Present (04/16/2022)   [place]    Feeling of Stress : To some extent  Social Connections: Moderately Isolated (04/16/2022)   Social Connection and Isolation Panel [NHANES]    Frequency of Communication with Friends and Family: More than three times a week    Frequency of Social Gatherings with Friends and Family: More than three times a week    Attends Religious Services: Never    Marine scientist or Organizations: No    Attends Archivist Meetings: Never    Marital Status: Married  Human resources officer Violence: Not At Risk (04/16/2022)   Humiliation, Afraid, Rape, and Kick questionnaire    Fear of Current or Ex-Partner: No    Emotionally Abused: No    Physically Abused: No    Sexually Abused: No    Outpatient Encounter Medications as of 11/07/2022  Medication Sig   doxycycline (VIBRA-TABS) 100 MG tablet Take 1 tablet (100 mg total) by mouth 2 (two) times daily for 10 days. 1 po bid   trimethoprim-polymyxin b (POLYTRIM) ophthalmic solution Place 1 drop into the left eye 4 (four) times daily for 5 days.   cephALEXin  (KEFLEX) 500 MG capsule Take 500 mg by mouth as needed (Before dental procedures). (Patient not taking: Reported on 04/16/2022)   Cholecalciferol (VITAMIN D) 2000 units CAPS Take by mouth.   levothyroxine (SYNTHROID) 150 MCG tablet TAKE 1/2 TABLET BY MOUTH EVERY DAY EXCEPT 1 TABLET ON MONDAYS AND FRIDAYS.   losartan (COZAAR) 25 MG tablet Take 1 tablet (25 mg total) by mouth daily.   Multiple Vitamins-Minerals (CENTRUM SILVER PO) Take 1 tablet by mouth daily.   Multiple Vitamins-Minerals (HAIR/SKIN/NAILS/BIOTIN) TABS Take by mouth.   nabumetone (RELAFEN) 500 MG tablet Take 500 mg by mouth daily.   nystatin cream (MYCOSTATIN) Apply 1 application. topically as needed for dry skin.   Propylene Glycol (SYSTANE BALANCE OP) Apply 1 drop to eye 2 (two) times daily as needed (Dry eyes). Reported on 01/31/2016   rosuvastatin (CRESTOR) 40 MG tablet Take 1 tablet (40 mg total) by mouth daily.   Triamcinolone Acetonide (TRIAMCINOLONE 0.1 % CREAM : EUCERIN) CREA Apply 1 application topically 2 (two) times daily.   vitamin B-12 (CYANOCOBALAMIN) 1000 MCG tablet Take 1,000 mcg by mouth every other day. Take one tablet every other day   No facility-administered encounter medications on file as of 11/07/2022.    Allergies  Allergen Reactions   Clindamycin/Lincomycin Other (See Comments)    Dizzy spells   Codeine Nausea And Vomiting   Crestor [Rosuvastatin Calcium] Other (See Comments)    Aching flu like large dose    Lipitor [Atorvastatin Calcium] Other (See Comments)    Numbness in feet    Pravachol Other (See Comments)    Back pain     Simvastatin Other (See Comments)    Back pain     Ultram [Tramadol Hcl] Nausea And Vomiting   Vioxx [Rofecoxib] Other (See Comments)    Fluid rentention , elevated blood pressure    Feldene [Piroxicam] Rash   Myrbetriq [Mirabegron] Other (See Comments)    Constipation, headache   Penicillins Rash    Pt states she CAN take Keflex   Protonix [Pantoprazole] Rash    Toviaz [Fesoterodine Fumarate Er] Other (See Comments)    Dry skin, dry eyes, constipation    Review of Systems  Constitutional:  Positive for activity change. Negative  for appetite change, chills, diaphoresis, fatigue, fever and unexpected weight change.  HENT:  Positive for congestion, postnasal drip, rhinorrhea and sore throat. Negative for dental problem, drooling, ear discharge, ear pain, facial swelling, hearing loss, mouth sores, nosebleeds, sinus pressure, sinus pain, sneezing, tinnitus, trouble swallowing and voice change.   Eyes:  Positive for blurred vision, discharge, redness, itching and visual disturbance. Negative for double vision, photophobia and pain.  Respiratory:  Positive for cough. Negative for apnea, choking, chest tightness, shortness of breath, wheezing and stridor.   Cardiovascular:  Negative for chest pain, palpitations and leg swelling.  Gastrointestinal:  Negative for abdominal pain, blood in stool, constipation, diarrhea, nausea and vomiting.  Endocrine: Negative.   Genitourinary:  Negative for decreased urine volume, difficulty urinating, dysuria, frequency and urgency.  Musculoskeletal:  Negative for arthralgias, joint pain, myalgias and neck pain.  Skin: Negative.  Negative for rash.  Allergic/Immunologic: Negative.   Neurological:  Negative for dizziness, weakness and headaches.  Hematological: Negative.   Psychiatric/Behavioral:  Negative for confusion, hallucinations, sleep disturbance and suicidal ideas.   All other systems reviewed and are negative.        Observations/Objective: No vital signs or physical exam, this was a virtual health encounter.  Pt alert and oriented, answers all questions appropriately, and able to speak in full sentences.    Assessment and Plan: Nina was seen today for eye problem and uri.  Diagnoses and all orders for this visit:  URI with cough and congestion Ongoing symptoms for over 2 weeks, will initiate  antibiotic therapy. Symptomatic care at home discussed in detail. Aware to report any new, worsening, or persistent symptoms.  -     doxycycline (VIBRA-TABS) 100 MG tablet; Take 1 tablet (100 mg total) by mouth 2 (two) times daily for 10 days. 1 po bid  Acute bacterial conjunctivitis of left eye Reported symptoms consistent with bacterial conjunctivitis. Will treat with below. Pt aware of red flags which need an in person evaluation.  -     trimethoprim-polymyxin b (POLYTRIM) ophthalmic solution; Place 1 drop into the left eye 4 (four) times daily for 5 days.     Follow Up Instructions: Return if symptoms worsen or fail to improve.    I discussed the assessment and treatment plan with the patient. The patient was provided an opportunity to ask questions and all were answered. The patient agreed with the plan and demonstrated an understanding of the instructions.   The patient was advised to call back or seek an in-person evaluation if the symptoms worsen or if the condition fails to improve as anticipated.  The above assessment and management plan was discussed with the patient. The patient verbalized understanding of and has agreed to the management plan. Patient is aware to call the clinic if they develop any new symptoms or if symptoms persist or worsen. Patient is aware when to return to the clinic for a follow-up visit. Patient educated on when it is appropriate to go to the emergency department.    I provided 15 minutes of time during this telephone encounter.   Monia Pouch, FNP-C [facility] [address] [place], [place] [address] [phone] 11/07/2022

## 2022-11-07 NOTE — Telephone Encounter (Signed)
Pt aware to come in for labwork, orders placed

## 2022-11-26 ENCOUNTER — Other Ambulatory Visit: Payer: Medicare PPO

## 2022-11-26 DIAGNOSIS — E890 Postprocedural hypothyroidism: Secondary | ICD-10-CM | POA: Diagnosis not present

## 2022-11-27 ENCOUNTER — Other Ambulatory Visit: Payer: Self-pay | Admitting: Family Medicine

## 2022-11-27 DIAGNOSIS — E890 Postprocedural hypothyroidism: Secondary | ICD-10-CM

## 2022-11-27 LAB — TSH: TSH: 0.387 u[IU]/mL — ABNORMAL LOW (ref 0.450–4.500)

## 2022-11-27 LAB — T4, FREE: Free T4: 1.61 ng/dL (ref 0.82–1.77)

## 2022-11-27 MED ORDER — LEVOTHYROXINE SODIUM 75 MCG PO TABS: ORAL_TABLET | ORAL | 3 refills | Status: DC

## 2023-02-14 ENCOUNTER — Other Ambulatory Visit: Payer: Medicare PPO

## 2023-02-14 DIAGNOSIS — E890 Postprocedural hypothyroidism: Secondary | ICD-10-CM

## 2023-02-15 LAB — TSH: TSH: 0.951 u[IU]/mL (ref 0.450–4.500)

## 2023-02-15 LAB — T4, FREE: Free T4: 1.26 ng/dL (ref 0.82–1.77)

## 2023-04-18 ENCOUNTER — Telehealth: Payer: Self-pay

## 2023-04-18 ENCOUNTER — Ambulatory Visit (INDEPENDENT_AMBULATORY_CARE_PROVIDER_SITE_OTHER): Payer: Medicare PPO

## 2023-04-18 VITALS — Ht 63.0 in | Wt 191.0 lb

## 2023-04-18 DIAGNOSIS — Z0000 Encounter for general adult medical examination without abnormal findings: Secondary | ICD-10-CM | POA: Diagnosis not present

## 2023-04-18 NOTE — Progress Notes (Signed)
Subjective:   Miranda Wilson is a 72 y.o. female who presents for Medicare Annual (Subsequent) preventive examination.  Visit Complete: Virtual  I connected with  Miranda Wilson on 04/18/23 by a audio enabled telemedicine application and verified that I am speaking with the correct person using two identifiers.  Patient Location: Home  Provider Location: Home Office  I discussed the limitations of evaluation and management by telemedicine. The patient expressed understanding and agreed to proceed.  Patient Medicare AWV questionnaire was completed by the patient on 04/18/2023; I have confirmed that all information answered by patient is correct and no changes since this date.  Review of Systems     Cardiac Risk Factors include: advanced age (>90men, >82 women);dyslipidemia     Objective:    Today's Vitals   04/18/23 0820  Weight: 191 lb (86.6 kg)  Height: 5\' 3"  (1.6 m)   Body mass index is 33.83 kg/m.     04/18/2023    8:27 AM 04/16/2022    8:31 AM 04/13/2021    9:36 AM 11/21/2020    8:50 AM 04/12/2020    8:54 AM 04/08/2019    9:03 AM 12/20/2016   10:54 AM  Advanced Directives  Does Patient Have a Medical Advance Directive? Yes Yes Yes No No No Yes  Type of Estate agent of [place];Living will Healthcare Power of [place];Living will Healthcare Power of [place];Living will    Healthcare Power of [place];Living will  Does patient want to make changes to medical advance directive?       Yes (Inpatient - patient defers changing a medical advance directive at this time)  Copy of Healthcare Power of Attorney in Chart? No - copy requested No - copy requested No - copy requested    No - copy requested  Would patient like information on creating a medical advance directive?     No - Patient declined No - Patient declined     Current Medications (verified) Outpatient Encounter Medications as of 04/18/2023  Medication Sig   Cholecalciferol (VITAMIN D) 2000  units CAPS Take by mouth.   levothyroxine (SYNTHROID) 75 MCG tablet Take 1 tablet daily. **change in dose   losartan (COZAAR) 25 MG tablet Take 1 tablet (25 mg total) by mouth daily.   Multiple Vitamins-Minerals (CENTRUM SILVER PO) Take 1 tablet by mouth daily.   nabumetone (RELAFEN) 500 MG tablet Take 500 mg by mouth daily.   Propylene Glycol (SYSTANE BALANCE OP) Apply 1 drop to eye 2 (two) times daily as needed (Dry eyes). Reported on 01/31/2016   rosuvastatin (CRESTOR) 40 MG tablet Take 1 tablet (40 mg total) by mouth daily.   Triamcinolone Acetonide (TRIAMCINOLONE 0.1 % CREAM : EUCERIN) CREA Apply 1 application topically 2 (two) times daily.   vitamin B-12 (CYANOCOBALAMIN) 1000 MCG tablet Take 1,000 mcg by mouth every other day. Take one tablet every other day   cephALEXin (KEFLEX) 500 MG capsule Take 500 mg by mouth as needed (Before dental procedures).   Multiple Vitamins-Minerals (HAIR/SKIN/NAILS/BIOTIN) TABS Take by mouth.   nystatin cream (MYCOSTATIN) Apply 1 application. topically as needed for dry skin.   No facility-administered encounter medications on file as of 04/18/2023.    Allergies (verified) Clindamycin/lincomycin, Codeine, Crestor [rosuvastatin calcium], Lipitor [atorvastatin calcium], Pravachol, Simvastatin, Ultram [tramadol hcl], Vioxx [rofecoxib], Feldene [piroxicam], Myrbetriq [mirabegron], Penicillins, Protonix [pantoprazole], and Toviaz [fesoterodine fumarate er]   History: Past Medical History:  Diagnosis Date   At risk for sleep apnea    Bilateral  edema of lower extremity    left > right --  wear compression hose   Cervical spondylosis    Chronic constipation    Cystitis [id number]'[id number]   Dr Lorenso Courier - here    GERD (gastroesophageal reflux disease)    History of colon polyps    History of idiopathic seizure    AGE 28 to 20  X5  --  UNKNOW IDIOLOGY , PER PT WAS ANEMIC AT THE TIME--  NONE SINCE   History of tachycardia    S/P  RADIOACTIVE IODINE ABLATION OF THYROID    Hyperlipidemia    Hypertension    Hypothyroidism, postradioiodine therapy    AGE 64   IBS (irritable bowel syndrome)    Neuropathy    OA (osteoarthritis)    shoulders  left > right   PMB (postmenopausal bleeding)    S/P radioactive iodine thyroid ablation    Urge urinary incontinence    Varicose veins    Vein disorder    LEFT ANKLE VEIN VALVE REFLUX WITH DECREASED CIRCULATION    Past Surgical History:  Procedure Laterality Date   CATARACT EXTRACTION W/ INTRAOCULAR LENS  IMPLANT, BILATERAL  right 2011//  left 2013   CESAREAN SECTION  1979   CLOSED LEFT KNEE MANIPULATION AND RIGHT KNEE CORTISONE INJECTION  02-20-2010   COLONOSCOPY W/ POLYPECTOMY  10-25-2008   DILATION AND CURETTAGE OF UTERUS     ENDOVENOUS ABLATION SAPHENOUS VEIN W/ LASER  04/2007   EYE SURGERY Bilateral    cataract   EYE SURGERY Bilateral    eyelid lifts /// left eye - placed removed   HYSTEROSCOPY WITH D & C N/A 11/11/2014   Procedure: DILATATION AND CURETTAGE /HYSTEROSCOPY;  Surgeon: Miguel Aschoff, MD;  Location: [facility];  Service: Gynecology;  Laterality: N/A;   JOINT REPLACEMENT     LAPAROSCOPIC CHOLECYSTECTOMY  1993   and BILATERAL TUBAL LIGATION   LEFT KNEE ARTHROTOMY W/ LYSIS ADHESIONS  12-01-2010   MOUTH SURGERY     dental implants   TONSILLECTOMY  1955   TOTAL KNEE ARTHROPLASTY Left 12-19-2009   TOTAL KNEE ARTHROPLASTY Right 02/13/2016   Procedure: RIGHT TOTAL KNEE ARTHROPLASTY;  Surgeon: Ollen Gross, MD;  Location: [facility];  Service: Orthopedics;  Laterality: Right;   TUBAL LIGATION     VARICOSE VEIN SURGERY  1998 to 2010   includes laser and phlebectomies   Family History  Problem Relation Age of Onset   COPD Mother        smoked   Parkinsonism Mother    Arthritis Mother    Epilepsy Mother        early in 55's   Myasthenia gravis Father    Asthma Father    Emphysema Father        smoked   Glaucoma Father    Arthritis Brother        knees   Heart disease Brother     Diabetes Brother    Hyperlipidemia Brother    Hyperlipidemia Brother    Hyperlipidemia Brother    Heart disease Brother    Hyperlipidemia Brother    Kidney disease Maternal Uncle    CAD Paternal Grandmother 61   Epilepsy Daughter 44   Diabetes Daughter    Colon cancer Neg Hx    Esophageal cancer Neg Hx    Stomach cancer Neg Hx    Rectal cancer Neg Hx    Social History   Socioeconomic History   Marital status: Married    Spouse name:  Kathlene November   Number of children: 1   Years of education: 16   Highest education level: Bachelor's degree (e.g., BA, AB, BS)  Occupational History   Occupation: Retired     Comment: Runner, broadcasting/film/video  Tobacco Use   Smoking status: Never   Smokeless tobacco: Never  Vaping Use   Vaping Use: Never used  Substance and Sexual Activity   Alcohol use: No   Drug use: No   Sexual activity: Not Currently    Birth control/protection: None    Comment: not much  Other Topics Concern   Not on file  Social History Narrative   Lives with husband in a one story home. Back end of home has steps leading out to a deck. Grandson stays with them 5 days per week while his mother works   Her husband is very HOH, had recent brain surgery, she's his caretaker   Right handed   Drinks a lot of caffiene   Social Determinants of Health   Financial Resource Strain: Low Risk  (04/18/2023)   Overall Financial Resource Strain (CARDIA)    Difficulty of Paying Living Expenses: Not hard at all  Food Insecurity: No Food Insecurity (04/18/2023)   Hunger Vital Sign    Worried About Running Out of Food in the Last Year: Never true    Ran Out of Food in the Last Year: Never true  Transportation Needs: No Transportation Needs (04/18/2023)   PRAPARE - Administrator, Civil Service (Medical): No    Lack of Transportation (Non-Medical): No  Physical Activity: Insufficiently Active (04/18/2023)   Exercise Vital Sign    Days of Exercise per Week: 2 days    Minutes of Exercise per  Session: 20 min  Stress: No Stress Concern Present (04/18/2023)   [employer] of Occupational Health - Occupational Stress Questionnaire    Feeling of Stress : Not at all  Social Connections: Moderately Isolated (04/18/2023)   Social Connection and Isolation Panel [NHANES]    Frequency of Communication with Friends and Family: More than three times a week    Frequency of Social Gatherings with Friends and Family: More than three times a week    Attends Religious Services: Never    Database administrator or Organizations: No    Attends Engineer, structural: Never    Marital Status: Married    Tobacco Counseling Counseling given: Not Answered   Clinical Intake:  Pre-visit preparation completed: Yes  Pain : No/denies pain     Nutritional Risks: None Diabetes: No  How often do you need to have someone help you when you read instructions, pamphlets, or other written materials from your doctor or pharmacy?: 1 - Never  Interpreter Needed?: No  Information entered by :: Renie Ora, LPN   Activities of Daily Living    04/18/2023    8:27 AM 04/17/2023    2:49 PM  In your present state of health, do you have any difficulty performing the following activities:  Hearing? 0 0  Vision? 0 0  Difficulty concentrating or making decisions? 0 1  Walking or climbing stairs? 0 1  Dressing or bathing? 0 1  Doing errands, shopping? 0 0  Preparing Food and eating ? N N  Using the Toilet? N Y  In the past six months, have you accidently leaked urine? N Y  Do you have problems with loss of bowel control? N N  Managing your Medications? N N  Managing your Finances? N N  Housekeeping or managing your Housekeeping? N Y    Patient Care Team: Raliegh Ip, DO as PCP - General (Family Medicine) Bettey Costa, MD as Consulting Physician (Dermatology) Delora Fuel, [place] (Optometry) Iva Boop, MD as Consulting Physician (Gastroenterology)  Indicate any recent  Medical Services you may have received from other than [facility] providers in the past year (date may be approximate).     Assessment:   This is a routine wellness examination for [place].  Hearing/Vision screen Vision Screening - Comments:: Wears rx glasses - up to date with routine eye exams with  Dr.Johnson   Dietary issues and exercise activities discussed:     Goals Addressed             This Visit's Progress    DIET - INCREASE WATER INTAKE         Depression Screen    04/18/2023    8:26 AM 05/25/2022    1:05 PM 05/17/2022    1:17 PM 04/16/2022    8:32 AM 11/27/2021    4:10 PM 08/01/2021    3:09 PM 04/13/2021    9:32 AM  PHQ 2/9 Scores  PHQ - 2 Score 0 0 0 1 1 0 0  PHQ- 9 Score  7 4  7 1      Fall Risk    04/18/2023    8:22 AM 04/17/2023    2:49 PM 05/25/2022    1:05 PM 04/16/2022    8:10 AM 11/27/2021    4:10 PM  Fall Risk   Falls in the past year? 0 0 1 1 1   Number falls in past yr: 0  0 1 1  Injury with Fall? 0  0 0 0  Risk for fall due to : No Fall Risks  History of fall(s) History of fall(s);Impaired balance/gait;Orthopedic patient History of fall(s)  Follow up Falls prevention discussed  Education provided Education provided;Falls prevention discussed Education provided    MEDICARE RISK AT HOME:  Medicare Risk at Home - 04/18/23 8119     Any stairs in or around the home? Yes    If so, are there any without handrails? No    Home free of loose throw rugs in walkways, pet beds, electrical cords, etc? Yes    Adequate lighting in your home to reduce risk of falls? Yes    Life alert? No    Use of a cane, walker or w/c? Yes    Grab bars in the bathroom? Yes    Shower chair or bench in shower? Yes    Elevated toilet seat or a handicapped toilet? Yes             TIMED UP AND GO:  Was the test performed?  No    Cognitive Function:    01/04/2020   11:39 AM 09/30/2018   10:18 AM 12/20/2016   11:01 AM  MMSE - Mini Mental State Exam  Orientation to time 5 5 5    Orientation to Place 5 5 5   Registration 3 3 3   Attention/ Calculation 5 5 5   Recall 3 3 3   Language- name 2 objects 2 2 2   Language- repeat 1 1 1   Language- follow 3 step command 3 3 3   Language- read & follow direction 1 1 1   Write a sentence 1 1 1   Copy design 1 1 1   Total score 30 30 30         04/18/2023    8:28 AM 04/16/2022  8:34 AM 04/12/2020    9:03 AM 04/08/2019    9:11 AM  6CIT Screen  What Year? 0 points 0 points 0 points 0 points  What month? 0 points 0 points 0 points 0 points  What time? 0 points 0 points 0 points 0 points  Count back from 20 0 points 0 points 0 points 0 points  Months in reverse 0 points 0 points 0 points 0 points  Repeat phrase 0 points 0 points 0 points 0 points  Total Score 0 points 0 points 0 points 0 points    Immunizations Immunization History  Administered Date(s) Administered   Fluad Quad(high Dose 65+) 08/01/2021   Influenza, High Dose Seasonal PF 07/18/2016, 08/15/2017, 09/21/2018   Influenza,inj,Quad PF,6+ Mos 08/18/2015   Influenza-Unspecified 08/21/2019   PFIZER(Purple Top)SARS-COV-2 Vaccination 03/19/2020, 04/09/2020, 10/15/2020, 05/13/2021   Pneumococcal Conjugate-13 02/03/2015   Pneumococcal Polysaccharide-23 11/23/2016   Td 11/12/2011   Tdap 11/12/2011    TDAP status: Up to date  Flu Vaccine status: Up to date  Pneumococcal vaccine status: Up to date  Covid-19 vaccine status: Completed vaccines  Qualifies for Shingles Vaccine? Yes   Zostavax completed No   Shingrix Completed?: No.    Education has been provided regarding the importance of this vaccine. Patient has been advised to call insurance company to determine out of pocket expense if they have not yet received this vaccine. Advised may also receive vaccine at local pharmacy or Health Dept. Verbalized acceptance and understanding.  Screening Tests Health Maintenance  Topic Date Due   Hepatitis C Screening  Never done   Zoster Vaccines- Shingrix (1 of 2)  Never done   DTaP/Tdap/Td (3 - Td or Tdap) 11/11/2021   COVID-19 Vaccine (5 - 2023-24 season) 06/29/2022   INFLUENZA VACCINE  05/30/2023   MAMMOGRAM  07/05/2023   Medicare Annual Wellness (AWV)  04/17/2024   DEXA SCAN  08/16/2026   Colonoscopy  10/11/2026   Pneumonia Vaccine 51+ Years old  Completed   HPV VACCINES  Aged Out    Health Maintenance  Health Maintenance Due  Topic Date Due   Hepatitis C Screening  Never done   Zoster Vaccines- Shingrix (1 of 2) Never done   DTaP/Tdap/Td (3 - Td or Tdap) 11/11/2021   COVID-19 Vaccine (5 - 2023-24 season) 06/29/2022    Colorectal cancer screening: Type of screening: Colonoscopy. Completed 10/11/2016. Repeat every 10 years  Mammogram status: Completed 07/04/2022. Repeat every year  Bone Density status: Completed 08/16/2021. Results reflect: Bone density results: OSTEOPENIA. Repeat every 5 years.  Lung Cancer Screening: (Low Dose CT Chest recommended if Age 36-80 years, 20 pack-year currently smoking OR have quit w/in 15years.) does not qualify.   Lung Cancer Screening Referral: n/a  Additional Screening:  Hepatitis C Screening: does qualify;   Vision Screening: Recommended annual ophthalmology exams for early detection of glaucoma and other disorders of the eye. Is the patient up to date with their annual eye exam?  Yes  Who is the provider or what is the name of the office in which the patient attends annual eye exams? Dr.Johnson  If pt is not established with a provider, would they like to be referred to a provider to establish care? No .   Dental Screening: Recommended annual dental exams for proper oral hygiene  Diabetic Foot Exam: Diabetic Foot Exam: Overdue, Pt has been advised about the importance in completing this exam. Pt is scheduled for diabetic foot exam on next office visit .  [employer]  Referral / Chronic Care Management: CRR required this visit?  No   CCM required this visit?  No     Plan:     I  have personally reviewed and noted the following in the patient's chart:   Medical and social history Use of alcohol, tobacco or illicit drugs  Current medications and supplements including opioid prescriptions. Patient is not currently taking opioid prescriptions. Functional ability and status Nutritional status Physical activity Advanced directives List of other physicians Hospitalizations, surgeries, and ER visits in previous 12 months Vitals Screenings to include cognitive, depression, and falls Referrals and appointments  In addition, I have reviewed and discussed with patient certain preventive protocols, quality metrics, and best practice recommendations. A written personalized care plan for preventive services as well as general preventive health recommendations were provided to patient.     Lorrene Reid, LPN   11/28/8655   After Visit Summary: (MyChart) Due to this being a telephonic visit, the after visit summary with patients personalized plan was offered to patient via MyChart   Nurse Notes: Patient has multiply ongoing medical issues she wants to discuss with Dr. Nadine Counts , advised patient to schedule appointment to be evaluated , explained to patient I cannot order blood work or further testing this is her mwv, offered to make appointment she states she will call office to schedule , patient became upset , again advised patient she needs to evaluated by her Pcp _L,Wilson ,LPN

## 2023-04-18 NOTE — Telephone Encounter (Signed)
Patient has multiply ongoing medical issues she wants to discuss with Dr. Nadine Counts , including neuronopathy, dizziness and headaches and arthritis patient requested a Ct scan and blood work  advised patient to schedule appointment to be evaluated , explained to patient I cannot order blood work or further testing this is her mwv, offered to make appointment she states she will call office to schedule , patient became upset , again advised patient she needs to evaluated by her Pcp _L,Wilson ,LPN

## 2023-04-18 NOTE — Patient Instructions (Signed)
Miranda Wilson , Thank you for taking time to come for your Medicare Wellness Visit. I appreciate your ongoing commitment to your health goals. Please review the following plan we discussed and let me know if I can assist you in the future.   These are the goals we discussed:  Goals       DIET - INCREASE WATER INTAKE      Exercise 3x per week (30 min per time)      Has trouble due to leg pain and right knee stiffness and pain      Patient Stated (pt-stated)      " I want to work on keeping my mind [place] by doing word puzzles"      Patient Stated      04/16/2022 AWV Goal: Keep All Scheduled Appointments  Over the next year, patient will attend all scheduled appointments with their PCP and any specialists that they see.         This is a list of the screening recommended for you and due dates:  Health Maintenance  Topic Date Due   Hepatitis C Screening  Never done   Zoster (Shingles) Vaccine (1 of 2) Never done   DTaP/Tdap/Td vaccine (3 - Td or Tdap) 11/11/2021   COVID-19 Vaccine (5 - 2023-24 season) 06/29/2022   Flu Shot  05/30/2023   Mammogram  07/05/2023   Medicare Annual Wellness Visit  04/17/2024   DEXA scan (bone density measurement)  08/16/2026   Colon Cancer Screening  10/11/2026   Pneumonia Vaccine  Completed   HPV Vaccine  Aged Out    Advanced directives: Please bring a copy of your health care power of attorney and living will to the office to be added to your chart at your convenience.   Conditions/risks identified: Aim for 30 minutes of exercise or brisk walking, 6-8 glasses of water, and 5 servings of fruits and vegetables each day.   Next appointment: Follow up in one year for your annual wellness visit    Preventive Care 65 Years and Older, Female Preventive care refers to lifestyle choices and visits with your health care provider that can promote health and wellness. What does preventive care include? A yearly physical exam. This is also called an annual well  check. Dental exams once or twice a year. Routine eye exams. Ask your health care provider how often you should have your eyes checked. Personal lifestyle choices, including: Daily care of your teeth and gums. Regular physical activity. Eating a healthy diet. Avoiding tobacco and drug use. Limiting alcohol use. Practicing safe sex. Taking low-dose aspirin every day. Taking vitamin and mineral supplements as recommended by your health care provider. What happens during an annual well check? The services and screenings done by your health care provider during your annual well check will depend on your age, overall health, lifestyle risk factors, and family history of disease. Counseling  Your health care provider may ask you questions about your: Alcohol use. Tobacco use. Drug use. Emotional well-being. Home and relationship well-being. Sexual activity. Eating habits. History of falls. Memory and ability to understand (cognition). Work and work Astronomer. Reproductive health. Screening  You may have the following tests or measurements: Height, weight, and BMI. Blood pressure. Lipid and cholesterol levels. These may be checked every 5 years, or more frequently if you are over 68 years old. Skin check. Lung cancer screening. You may have this screening every year starting at age 33 if you have a 30-pack-year history of  smoking and currently smoke or have quit within the past 15 years. Fecal occult blood test (FOBT) of the stool. You may have this test every year starting at age 55. Flexible sigmoidoscopy or colonoscopy. You may have a sigmoidoscopy every 5 years or a colonoscopy every 10 years starting at age 63. Hepatitis C blood test. Hepatitis B blood test. Sexually transmitted disease (STD) testing. Diabetes screening. This is done by checking your blood sugar (glucose) after you have not eaten for a while (fasting). You may have this done every 1-3 years. Bone density scan.  This is done to screen for osteoporosis. You may have this done starting at age 60. Mammogram. This may be done every 1-2 years. Talk to your health care provider about how often you should have regular mammograms. Talk with your health care provider about your test results, treatment options, and if necessary, the need for more tests. Vaccines  Your health care provider may recommend certain vaccines, such as: Influenza vaccine. This is recommended every year. Tetanus, diphtheria, and acellular pertussis (Tdap, Td) vaccine. You may need a Td booster every 10 years. Zoster vaccine. You may need this after age 25. Pneumococcal 13-valent conjugate (PCV13) vaccine. One dose is recommended after age 40. Pneumococcal polysaccharide (PPSV23) vaccine. One dose is recommended after age 46. Talk to your health care provider about which screenings and vaccines you need and how often you need them. This information is not intended to replace advice given to you by your health care provider. Make sure you discuss any questions you have with your health care provider. Document Released: 11/11/2015 Document Revised: 07/04/2016 Document Reviewed: 08/16/2015 Elsevier Interactive Patient Education  2017 [employer].  Fall Prevention in the Home Falls can cause injuries. They can happen to people of all ages. There are many things you can do to make your home safe and to help prevent falls. What can I do on the outside of my home? Regularly fix the edges of walkways and driveways and fix any cracks. Remove anything that might make you trip as you walk through a door, such as a raised step or threshold. Trim any bushes or trees on the path to your home. Use bright outdoor lighting. Clear any walking paths of anything that might make someone trip, such as rocks or tools. Regularly check to see if handrails are loose or broken. Make sure that both sides of any steps have handrails. Any raised decks and porches  should have guardrails on the edges. Have any leaves, snow, or ice cleared regularly. Use sand or salt on walking paths during winter. Clean up any spills in your garage right away. This includes oil or grease spills. What can I do in the bathroom? Use night lights. Install grab bars by the toilet and in the tub and shower. Do not use towel bars as grab bars. Use non-skid mats or decals in the tub or shower. If you need to sit down in the shower, use a plastic, non-slip stool. Keep the floor dry. Clean up any water that spills on the floor as soon as it happens. Remove soap buildup in the tub or shower regularly. Attach bath mats securely with double-sided non-slip rug tape. Do not have throw rugs and other things on the floor that can make you trip. What can I do in the bedroom? Use night lights. Make sure that you have a light by your bed that is easy to reach. Do not use any sheets or blankets that  are too big for your bed. They should not hang down onto the floor. Have a firm chair that has side arms. You can use this for support while you get dressed. Do not have throw rugs and other things on the floor that can make you trip. What can I do in the kitchen? Clean up any spills right away. Avoid walking on wet floors. Keep items that you use a lot in easy-to-reach places. If you need to reach something above you, use a strong step stool that has a grab bar. Keep electrical cords out of the way. Do not use floor polish or wax that makes floors slippery. If you must use wax, use non-skid floor wax. Do not have throw rugs and other things on the floor that can make you trip. What can I do with my stairs? Do not leave any items on the stairs. Make sure that there are handrails on both sides of the stairs and use them. Fix handrails that are broken or loose. Make sure that handrails are as long as the stairways. Check any carpeting to make sure that it is firmly attached to the stairs.  Fix any carpet that is loose or worn. Avoid having throw rugs at the top or bottom of the stairs. If you do have throw rugs, attach them to the floor with carpet tape. Make sure that you have a light switch at the top of the stairs and the bottom of the stairs. If you do not have them, ask someone to add them for you. What else can I do to help prevent falls? Wear shoes that: Do not have high heels. Have rubber bottoms. Are comfortable and fit you well. Are closed at the toe. Do not wear sandals. If you use a stepladder: Make sure that it is fully opened. Do not climb a closed stepladder. Make sure that both sides of the stepladder are locked into place. Ask someone to hold it for you, if possible. Clearly mark and make sure that you can see: Any grab bars or handrails. First and last steps. Where the edge of each step is. Use tools that help you move around (mobility aids) if they are needed. These include: Canes. Walkers. Scooters. Crutches. Turn on the lights when you go into a dark area. Replace any light bulbs as soon as they burn out. Set up your furniture so you have a clear path. Avoid moving your furniture around. If any of your floors are uneven, fix them. If there are any pets around you, be aware of where they are. Review your medicines with your doctor. Some medicines can make you feel dizzy. This can increase your chance of falling. Ask your doctor what other things that you can do to help prevent falls. This information is not intended to replace advice given to you by your health care provider. Make sure you discuss any questions you have with your health care provider. Document Released: 08/11/2009 Document Revised: 03/22/2016 Document Reviewed: 11/19/2014 Elsevier Interactive Patient Education  2017 [employer].

## 2023-04-19 NOTE — Telephone Encounter (Signed)
Looks like she has a PE scheduled in about 3-4 weeks. I agree with your recommendation, if she needs to be seen sooner, she should call the office to schedule sooner appt.

## 2023-05-27 ENCOUNTER — Ambulatory Visit: Payer: Medicare PPO | Admitting: Family Medicine

## 2023-05-27 ENCOUNTER — Encounter: Payer: Self-pay | Admitting: Family Medicine

## 2023-05-27 VITALS — BP 151/88 | HR 61 | Temp 98.5°F | Ht 63.0 in | Wt 198.0 lb

## 2023-05-27 DIAGNOSIS — Z130 Encounter for screening for diseases of the blood and blood-forming organs and certain disorders involving the immune mechanism: Secondary | ICD-10-CM

## 2023-05-27 DIAGNOSIS — I998 Other disorder of circulatory system: Secondary | ICD-10-CM | POA: Diagnosis not present

## 2023-05-27 DIAGNOSIS — G629 Polyneuropathy, unspecified: Secondary | ICD-10-CM | POA: Diagnosis not present

## 2023-05-27 DIAGNOSIS — R29818 Other symptoms and signs involving the nervous system: Secondary | ICD-10-CM

## 2023-05-27 DIAGNOSIS — E8881 Metabolic syndrome: Secondary | ICD-10-CM | POA: Diagnosis not present

## 2023-05-27 DIAGNOSIS — I10 Essential (primary) hypertension: Secondary | ICD-10-CM | POA: Diagnosis not present

## 2023-05-27 DIAGNOSIS — R32 Unspecified urinary incontinence: Secondary | ICD-10-CM

## 2023-05-27 DIAGNOSIS — R29898 Other symptoms and signs involving the musculoskeletal system: Secondary | ICD-10-CM

## 2023-05-27 DIAGNOSIS — R413 Other amnesia: Secondary | ICD-10-CM

## 2023-05-27 DIAGNOSIS — E890 Postprocedural hypothyroidism: Secondary | ICD-10-CM | POA: Diagnosis not present

## 2023-05-27 DIAGNOSIS — E7800 Pure hypercholesterolemia, unspecified: Secondary | ICD-10-CM | POA: Diagnosis not present

## 2023-05-27 DIAGNOSIS — Z23 Encounter for immunization: Secondary | ICD-10-CM

## 2023-05-27 DIAGNOSIS — M2550 Pain in unspecified joint: Secondary | ICD-10-CM

## 2023-05-27 LAB — BAYER DCA HB A1C WAIVED: HB A1C (BAYER DCA - WAIVED): 5.6 % (ref 4.8–5.6)

## 2023-05-27 MED ORDER — CHLORTHALIDONE 25 MG PO TABS: 25.0000 mg | ORAL_TABLET | Freq: Every day | ORAL | 3 refills | Status: DC | PRN

## 2023-05-27 MED ORDER — ALPHA-LIPOIC ACID 600 MG PO CAPS
600.0000 mg | ORAL_CAPSULE | Freq: Every day | ORAL | 3 refills | Status: DC
Start: 2023-05-27 — End: 2023-06-11

## 2023-05-27 NOTE — Progress Notes (Signed)
Subjective: CC: Patient initially here for annual physical exam but she has several medical issues that are not on preventative that she wants to address so she will schedule physical at a future date PCP: Raliegh Ip, DO Miranda Wilson is a 72 y.o. female presenting to clinic today for:  1.  Leg edema Patient reports that she has issues with leg edema left greater than right.  This seems to be much more prevalent during the summertime.  She admits to neuropathy in both hands and feet but the swelling seems to be a separate issue.  She notes that she uses compression hose but this seems to make it worse.  She is not on any diuretic but her husband takes chlorthalidone so she would be interested in something like that.  2.  Neuropathy/ memory changes Patient reports worsening neuropathy of hands and feet such that she feels like she is dropping things now.  She also reports some memory changes and difficulty with fine motor skills such as buttoning her buttons, applying earrings and picking up small things.  With regards to her memory changes she reports specifically that she will forget people's names or lose track of conversations and or asked somebody to repeat an entire conversation is already been had.  Family history significant for Parkinson disease in her birth mother.  She has previously seen neurology for nerve conduction studies and was told that there was nothing going on.  3.  Polyarthritis Patient reports arthritis in multiple joints including bilateral knees from which she has had bilateral knee replacements.  The left seems to be worse than the right.  She also has frequent backaches and pain in the first MCP of the right hand.  She is right-hand dominant.  The pain in the right MCP seems to be more prominent after repetitive movements.  She utilizes Tylenol with some relief of arthritic pains  4.  Hair loss, thinning, nail splitting Patient reports that she has been  taking hair skin and nail vitamins to help her hair thinning.  She notes that this has been ongoing since COVID but has slowed down quite a bit as time has passed.  She reports splitting of her nails easily and subsequently utilizes nail polish just to keep them together.  She also reports onychomycotic changes to the nails bilaterally.  She is under the care of Dr. Ulice Brilliant previously who used a Dremel tool to get this down.  She does not want to do anything because symptoms have been refractory to previous topicals  5.  Urinary incontinence Patient reports that she is 8 having ongoing urinary incontinence since 2011.  It seems to be worsening and she reports increased frequency, nocturia that is at least 4-5 times per night.  She has difficulty emptying her bladder totally and often will urinate and then have to turn around and urinate again.  She is been on multiple medications prescribed by Dr. Annabell Howells, including Gala Murdoch and a few other ones that she cannot remember the name of.  He is because severe constipation so she could not tolerate them.  During her last visit they discussed something about nerve stimulation but she never did go back and evaluate this further because symptoms were not as bad then.  She reports no blood in urine.  No new flank pain reported today.  6.  Fluctuating blood pressure Patient reports that she has blood pressures anywhere from systolics of 119-140s at home.  She reports new onset headaches  that resolved after she stopped utilizing a headband.  She reports no visual disturbance but again reports edema as above.  ROS: Per HPI  Allergies  Allergen Reactions   Clindamycin/Lincomycin Other (See Comments)    Dizzy spells   Codeine Nausea And Vomiting   Crestor [Rosuvastatin Calcium] Other (See Comments)    Aching flu like large dose    Lipitor [Atorvastatin Calcium] Other (See Comments)    Numbness in feet    Pravachol Other (See Comments)    Back pain     Simvastatin  Other (See Comments)    Back pain     Ultram [Tramadol Hcl] Nausea And Vomiting   Vioxx [Rofecoxib] Other (See Comments)    Fluid rentention , elevated blood pressure    Feldene [Piroxicam] Rash   Myrbetriq [Mirabegron] Other (See Comments)    Constipation, headache   Penicillins Rash    Pt states she CAN take Keflex   Protonix [Pantoprazole] Rash   Toviaz [Fesoterodine Fumarate Er] Other (See Comments)    Dry skin, dry eyes, constipation   Past Medical History:  Diagnosis Date   At risk for sleep apnea    Bilateral edema of lower extremity    left > right --  wear compression hose   Cervical spondylosis    Chronic constipation    Cystitis [id number]'[id number]   Dr Lorenso Courier - here    GERD (gastroesophageal reflux disease)    History of colon polyps    History of idiopathic seizure    AGE 76 to 20  X5  --  UNKNOW IDIOLOGY , PER PT WAS ANEMIC AT THE TIME--  NONE SINCE   History of tachycardia    S/P  RADIOACTIVE IODINE ABLATION OF THYROID   Hyperlipidemia    Hypertension    Hypothyroidism, postradioiodine therapy    AGE 56   IBS (irritable bowel syndrome)    Neuropathy    OA (osteoarthritis)    shoulders  left > right   PMB (postmenopausal bleeding)    S/P radioactive iodine thyroid ablation    Urge urinary incontinence    Varicose veins    Vein disorder    LEFT ANKLE VEIN VALVE REFLUX WITH DECREASED CIRCULATION     Current Outpatient Medications:    chlorthalidone (HYGROTON) 25 MG tablet, Take 1 tablet (25 mg total) by mouth daily as needed (swelling)., Disp: 90 tablet, Rfl: 3   Cholecalciferol (VITAMIN D) 2000 units CAPS, Take by mouth., Disp: , Rfl:    levothyroxine (SYNTHROID) 75 MCG tablet, Take 1 tablet daily. **change in dose, Disp: 90 tablet, Rfl: 3   losartan (COZAAR) 25 MG tablet, Take 1 tablet (25 mg total) by mouth daily., Disp: 90 tablet, Rfl: 3   Multiple Vitamins-Minerals (CENTRUM SILVER PO), Take 1 tablet by mouth daily., Disp: , Rfl:    nabumetone (RELAFEN) 500  MG tablet, Take 500 mg by mouth daily., Disp: , Rfl:    Propylene Glycol (SYSTANE BALANCE OP), Apply 1 drop to eye 2 (two) times daily as needed (Dry eyes). Reported on 01/31/2016, Disp: , Rfl:    rosuvastatin (CRESTOR) 40 MG tablet, Take 1 tablet (40 mg total) by mouth daily., Disp: 90 tablet, Rfl: 3   Triamcinolone Acetonide (TRIAMCINOLONE 0.1 % CREAM : EUCERIN) CREA, Apply 1 application topically 2 (two) times daily., Disp: , Rfl:    vitamin B-12 (CYANOCOBALAMIN) 1000 MCG tablet, Take 1,000 mcg by mouth every other day. Take one tablet every other day, Disp: , Rfl:  Social History  Socioeconomic History   Marital status: Married    Spouse name: Kathlene November   Number of children: 1   Years of education: 16   Highest education level: Bachelor's degree (e.g., BA, AB, BS)  Occupational History   Occupation: Retired     Comment: Runner, broadcasting/film/video  Tobacco Use   Smoking status: Never   Smokeless tobacco: Never  Vaping Use   Vaping status: Never Used  Substance and Sexual Activity   Alcohol use: No   Drug use: No   Sexual activity: Not Currently    Birth control/protection: None    Comment: not much  Other Topics Concern   Not on file  Social History Narrative   Lives with husband in a one story home. Back end of home has steps leading out to a deck. Grandson stays with them 5 days per week while his mother works   Her husband is very HOH, had recent brain surgery, she's his caretaker   Right handed   Drinks a lot of caffiene   Social Determinants of Health   Financial Resource Strain: Low Risk  (04/18/2023)   Overall Financial Resource Strain (CARDIA)    Difficulty of Paying Living Expenses: Not hard at all  Food Insecurity: No Food Insecurity (04/18/2023)   Hunger Vital Sign    Worried About Running Out of Food in the Last Year: Never true    Ran Out of Food in the Last Year: Never true  Transportation Needs: No Transportation Needs (04/18/2023)   PRAPARE - Scientist, research (physical sciences) (Medical): No    Lack of Transportation (Non-Medical): No  Physical Activity: Insufficiently Active (04/18/2023)   Exercise Vital Sign    Days of Exercise per Week: 2 days    Minutes of Exercise per Session: 20 min  Stress: No Stress Concern Present (04/18/2023)   [employer] of Occupational Health - Occupational Stress Questionnaire    Feeling of Stress : Not at all  Social Connections: Moderately Isolated (04/18/2023)   Social Connection and Isolation Panel [NHANES]    Frequency of Communication with Friends and Family: More than three times a week    Frequency of Social Gatherings with Friends and Family: More than three times a week    Attends Religious Services: Never    Database administrator or Organizations: No    Attends Banker Meetings: Never    Marital Status: Married  Catering manager Violence: Not At Risk (04/18/2023)   Humiliation, Afraid, Rape, and Kick questionnaire    Fear of Current or Ex-Partner: No    Emotionally Abused: No    Physically Abused: No    Sexually Abused: No   Family History  Problem Relation Age of Onset   COPD Mother        smoked   Parkinsonism Mother    Arthritis Mother    Epilepsy Mother        early in 20's   Myasthenia gravis Father    Asthma Father    Emphysema Father        smoked   Glaucoma Father    Arthritis Brother        knees   Heart disease Brother    Diabetes Brother    Hyperlipidemia Brother    Hyperlipidemia Brother    Hyperlipidemia Brother    Heart disease Brother    Hyperlipidemia Brother    Kidney disease Maternal Uncle    CAD Paternal Grandmother 42   Epilepsy Daughter 6  Diabetes Daughter    Colon cancer Neg Hx    Esophageal cancer Neg Hx    Stomach cancer Neg Hx    Rectal cancer Neg Hx     Objective: Office vital signs reviewed. BP (!) 151/88   Pulse 61   Temp 98.5 F (36.9 C)   Ht 5\' 3"  (1.6 m)   Wt 198 lb (89.8 kg)   LMP 04/29/1991   SpO2 100%   BMI 35.07  kg/m   Physical Examination:  General: Awake, alert, morbidly obese, No acute distress HEENT: Sclera white.  No exophthalmos.  No goiter Cardio: regular rate and rhythm, S1S2 heard, soft murmurs appreciated Pulm: clear to auscultation bilaterally, no wheezes, rhonchi or rales; normal work of breathing on room air Extremities: warm, well perfused, varicose veins noted throughout bilateral lower extremities, left greater than right.  Trace to +1 pitting edema left greater than right present in the lower extremity but this is much more pronounced in the dorsal aspects of bilateral feet MSK: Gait antalgic.  Arthritic changes noted throughout bilateral knees.  She has some mild arthritic changes noted in the forced MCP of the right hand.  No gross joint swelling, warmth or erythema appreciated. Neuro: Alert and oriented.  No gross tremor appreciated.    05/27/2023    1:04 PM 01/04/2020   11:39 AM 09/30/2018   10:18 AM  MMSE - Mini Mental State Exam  Orientation to time 5 5 5   Orientation to Place 5 5 5   Registration 3 3 3   Attention/ Calculation 5 5 5   Recall 3 3 3   Language- name 2 objects 2 2 2   Language- repeat 1 1 1   Language- follow 3 step command 3 3 3   Language- read & follow direction 1 1 1   Write a sentence 1 1 1   Copy design 1 1 1   Total score 30 30 30     Assessment/ Plan: 72 y.o. female   Memory changes - Plan: Ambulatory referral to Neurology  Polyneuropathy - Plan: Ambulatory referral to Neurology, Alpha-Lipoic Acid 600 MG CAPS  Fine motor skill loss - Plan: Ambulatory referral to Neurology  Urinary incontinence in female - Plan: Ambulatory referral to Urology  Polyarthralgia  Postablative hypothyroidism - Plan: TSH, T4, free, T4, free, TSH  Primary hypertension - Plan: CMP14+EGFR, chlorthalidone (HYGROTON) 25 MG tablet, CMP14+EGFR, Basic metabolic panel  Vascular insufficiency of extremity - Plan: chlorthalidone (HYGROTON) 25 MG tablet, Basic metabolic  panel  Metabolic syndrome - Plan: CMP14+EGFR, Lipid panel, Bayer DCA Hb A1c Waived, Bayer DCA Hb A1c Waived, CMP14+EGFR, Lipid panel  Pure hypercholesterolemia - Plan: Lipid panel, Lipid panel  Screening, anemia, deficiency, iron - Plan: CBC, CBC  Referral to neurology to further explore the memory changes.  Her MMSE score was 30 out of 30 but given reports of fine motor skill loss and family history of Parkinson disease I think that it is worth her being evaluated.  Additionally, she continues to suffer from neuropathy.  Pending her evaluation by neurology I wonder if she may benefit from gabapentin or Lyrica for treatment of neuropathy.  Glad to offer this should she desire but she has had multiple drug allergies in the past I am not sure if that she would be amenable to this or not.  In the meantime she could consider something like alpha lipoic acid and I will send this as a supplement for her to trial until her next visit  For urinary incontinence I referred her back to urology.  I think that is probably time for her to discuss that nerve stimulator in efforts to alleviate her symptoms.  Sounds like she has had quite a trial of several different medications including Toviaz, Myrbetriq, etc.  Check thyroid levels given history of post ablative hypothyroidism.  Blood pressure was not controlled today seems to be fluctuant at home.  We discussed salt reduction, addition of low-dose chlorthalidone.  I would like her to have a repeat renal function panel and blood pressure check with nurse in 2 weeks.  Future order placed  Check A1c, lipid panel given history of metabolic syndrome and obesity.  Screening for anemia also placed.  She will schedule physical exam for preventative health care issues at a future date.  First Shingrix vaccination administered.  Tetanus also administered today  Orders Placed This Encounter  Procedures   Zoster Recombinant (Shingrix )   Tdap vaccine greater than or  equal to 7yo IM   CMP14+EGFR    Standing Status:   Future    Number of Occurrences:   1    Standing Expiration Date:   05/26/2024   Lipid panel    Standing Status:   Future    Number of Occurrences:   1    Standing Expiration Date:   05/26/2024   TSH    Standing Status:   Future    Number of Occurrences:   1    Standing Expiration Date:   05/26/2024   T4, free    Standing Status:   Future    Number of Occurrences:   1    Standing Expiration Date:   05/26/2024   CBC    Standing Status:   Future    Number of Occurrences:   1    Standing Expiration Date:   05/26/2024   Bayer DCA Hb A1c Waived    Standing Status:   Future    Number of Occurrences:   1    Standing Expiration Date:   05/26/2024   Ambulatory referral to Neurology    Referral Priority:   Routine    Referral Type:   Consultation    Referral Reason:   Specialty Services Required    Requested Specialty:   Neurology    Number of Visits Requested:   1   Ambulatory referral to Urology    Referral Priority:   Routine    Referral Type:   Consultation    Referral Reason:   Specialty Services Required    Requested Specialty:   Urology    Number of Visits Requested:   1   Meds ordered this encounter  Medications   chlorthalidone (HYGROTON) 25 MG tablet    Sig: Take 1 tablet (25 mg total) by mouth daily as needed (swelling).    Dispense:  90 tablet    Refill:  3     Brysen Shankman Hulen Skains, DO [facility] [phone]

## 2023-05-28 ENCOUNTER — Other Ambulatory Visit: Payer: Self-pay | Admitting: Family Medicine

## 2023-05-28 ENCOUNTER — Telehealth: Payer: Self-pay | Admitting: Family Medicine

## 2023-05-28 DIAGNOSIS — E890 Postprocedural hypothyroidism: Secondary | ICD-10-CM

## 2023-05-28 MED ORDER — LEVOTHYROXINE SODIUM 75 MCG PO TABS
ORAL_TABLET | ORAL | 3 refills | Status: DC
Start: 2023-05-28 — End: 2024-01-10

## 2023-05-28 NOTE — Telephone Encounter (Signed)
Patient aware.

## 2023-05-28 NOTE — Telephone Encounter (Signed)
Absolutely fine

## 2023-06-10 ENCOUNTER — Ambulatory Visit: Payer: Medicare PPO | Admitting: *Deleted

## 2023-06-10 ENCOUNTER — Other Ambulatory Visit: Payer: Medicare PPO

## 2023-06-10 ENCOUNTER — Encounter: Payer: Self-pay | Admitting: Family Medicine

## 2023-06-10 VITALS — BP 129/75 | HR 64

## 2023-06-10 DIAGNOSIS — I10 Essential (primary) hypertension: Secondary | ICD-10-CM | POA: Diagnosis not present

## 2023-06-10 DIAGNOSIS — I998 Other disorder of circulatory system: Secondary | ICD-10-CM | POA: Diagnosis not present

## 2023-06-10 LAB — BASIC METABOLIC PANEL
BUN/Creatinine Ratio: 20 (ref 12–28)
BUN: 24 mg/dL (ref 8–27)
CO2: 25 mmol/L (ref 20–29)
Calcium: 9.4 mg/dL (ref 8.7–10.3)
Chloride: 99 mmol/L (ref 96–106)
Creatinine, Ser: 1.22 mg/dL — ABNORMAL HIGH (ref 0.57–1.00)
Glucose: 92 mg/dL (ref 70–99)
Potassium: 3.6 mmol/L (ref 3.5–5.2)
Sodium: 138 mmol/L (ref 134–144)
eGFR: 47 mL/min/{1.73_m2} — ABNORMAL LOW (ref >59)

## 2023-06-10 NOTE — Progress Notes (Signed)
BP check BP left arm large cuff 129 75 HR 64 Pt concerned over low readings she has had on Chlorthalidone at home But it has really help with her swelling, she can see her ankle bones now she says Copy of pt's reading put on PCP's desk

## 2023-06-11 ENCOUNTER — Other Ambulatory Visit: Payer: Self-pay | Admitting: Family Medicine

## 2023-06-12 ENCOUNTER — Other Ambulatory Visit: Payer: Self-pay | Admitting: Family Medicine

## 2023-06-12 DIAGNOSIS — I10 Essential (primary) hypertension: Secondary | ICD-10-CM

## 2023-07-02 ENCOUNTER — Ambulatory Visit: Payer: Medicare PPO | Admitting: Neurology

## 2023-07-03 ENCOUNTER — Other Ambulatory Visit: Payer: Self-pay | Admitting: Family Medicine

## 2023-07-03 DIAGNOSIS — Z1231 Encounter for screening mammogram for malignant neoplasm of breast: Secondary | ICD-10-CM

## 2023-07-08 ENCOUNTER — Inpatient Hospital Stay: Admission: RE | Admit: 2023-07-08 | Payer: Medicare PPO | Source: Ambulatory Visit

## 2023-07-22 ENCOUNTER — Other Ambulatory Visit: Payer: Self-pay | Admitting: *Deleted

## 2023-07-22 ENCOUNTER — Encounter: Payer: Self-pay | Admitting: Family Medicine

## 2023-08-02 ENCOUNTER — Ambulatory Visit (INDEPENDENT_AMBULATORY_CARE_PROVIDER_SITE_OTHER): Payer: Medicare PPO

## 2023-08-02 DIAGNOSIS — Z23 Encounter for immunization: Secondary | ICD-10-CM

## 2023-08-04 ENCOUNTER — Other Ambulatory Visit: Payer: Self-pay | Admitting: Family Medicine

## 2023-08-04 DIAGNOSIS — E7800 Pure hypercholesterolemia, unspecified: Secondary | ICD-10-CM

## 2023-08-05 ENCOUNTER — Encounter: Payer: Self-pay | Admitting: Family Medicine

## 2023-08-14 ENCOUNTER — Encounter: Payer: Self-pay | Admitting: *Deleted

## 2023-09-02 ENCOUNTER — Ambulatory Visit
Admission: RE | Admit: 2023-09-02 | Discharge: 2023-09-02 | Disposition: A | Discharge status: Home / Self Care | Payer: Medicare PPO | Source: Ambulatory Visit | Attending: Family Medicine

## 2023-09-02 DIAGNOSIS — Z1231 Encounter for screening mammogram for malignant neoplasm of breast: Secondary | ICD-10-CM | POA: Diagnosis not present

## 2023-12-06 ENCOUNTER — Encounter: Payer: Medicare PPO | Admitting: Family Medicine

## 2023-12-12 ENCOUNTER — Other Ambulatory Visit: Payer: Self-pay | Admitting: Family Medicine

## 2023-12-12 DIAGNOSIS — I10 Essential (primary) hypertension: Secondary | ICD-10-CM

## 2024-01-10 ENCOUNTER — Other Ambulatory Visit (HOSPITAL_BASED_OUTPATIENT_CLINIC_OR_DEPARTMENT_OTHER)

## 2024-01-10 ENCOUNTER — Encounter: Payer: Self-pay | Admitting: Family Medicine

## 2024-01-10 ENCOUNTER — Ambulatory Visit: Payer: Medicare PPO | Admitting: Family Medicine

## 2024-01-10 VITALS — BP 143/71 | HR 60 | Temp 98.4°F | Ht 63.0 in | Wt 193.8 lb

## 2024-01-10 DIAGNOSIS — E7800 Pure hypercholesterolemia, unspecified: Secondary | ICD-10-CM

## 2024-01-10 DIAGNOSIS — Z6379 Other stressful life events affecting family and household: Secondary | ICD-10-CM | POA: Diagnosis not present

## 2024-01-10 DIAGNOSIS — E538 Deficiency of other specified B group vitamins: Secondary | ICD-10-CM | POA: Diagnosis not present

## 2024-01-10 DIAGNOSIS — I10 Essential (primary) hypertension: Secondary | ICD-10-CM | POA: Diagnosis not present

## 2024-01-10 DIAGNOSIS — Z1159 Encounter for screening for other viral diseases: Secondary | ICD-10-CM

## 2024-01-10 DIAGNOSIS — R32 Unspecified urinary incontinence: Secondary | ICD-10-CM

## 2024-01-10 DIAGNOSIS — E559 Vitamin D deficiency, unspecified: Secondary | ICD-10-CM

## 2024-01-10 DIAGNOSIS — E890 Postprocedural hypothyroidism: Secondary | ICD-10-CM | POA: Diagnosis not present

## 2024-01-10 DIAGNOSIS — Z0001 Encounter for general adult medical examination with abnormal findings: Secondary | ICD-10-CM | POA: Diagnosis not present

## 2024-01-10 DIAGNOSIS — B372 Candidiasis of skin and nail: Secondary | ICD-10-CM

## 2024-01-10 DIAGNOSIS — N950 Postmenopausal bleeding: Secondary | ICD-10-CM | POA: Diagnosis not present

## 2024-01-10 DIAGNOSIS — Z0000 Encounter for general adult medical examination without abnormal findings: Secondary | ICD-10-CM

## 2024-01-10 MED ORDER — CHLORTHALIDONE 25 MG PO TABS: 25.0000 mg | ORAL_TABLET | Freq: Every day | ORAL | 3 refills | Status: DC | PRN

## 2024-01-10 MED ORDER — NYSTATIN 100000 UNIT/GM EX CREA: 1.0000 | TOPICAL_CREAM | Freq: Two times a day (BID) | CUTANEOUS | 1 refills | Status: AC

## 2024-01-10 MED ORDER — ROSUVASTATIN CALCIUM 40 MG PO TABS: 40.0000 mg | ORAL_TABLET | Freq: Every day | ORAL | 3 refills | Status: AC

## 2024-01-10 MED ORDER — LEVOTHYROXINE SODIUM 75 MCG PO TABS: ORAL_TABLET | ORAL | 3 refills | Status: DC

## 2024-01-10 MED ORDER — LOSARTAN POTASSIUM 25 MG PO TABS: 25.0000 mg | ORAL_TABLET | Freq: Every day | ORAL | 3 refills | Status: DC

## 2024-01-10 NOTE — Progress Notes (Signed)
 Miranda Wilson is a 73 y.o. female presents to office today for annual physical exam examination.    Concerns today include: 1.  None. she has been under more stress because of her husband having had a stroke after he had an aneurysm repaired.  Occupation: retired, Marital status: married, Substance use: none There are no preventive care reminders to display for this patient.  Refills needed today: all  Immunization History  Administered Date(s) Administered   Fluad Quad(high Dose 65+) 08/01/2021   Influenza, High Dose Seasonal PF 07/18/2016, 08/15/2017, 09/21/2018, 07/15/2023   Influenza,inj,Quad PF,6+ Mos 08/18/2015   Influenza-Unspecified 08/21/2019, 07/15/2023   PFIZER(Purple Top)SARS-COV-2 Vaccination 03/19/2020, 04/09/2020, 10/15/2020, 05/13/2021   Pfizer Covid-19 Vaccine Bivalent Booster 69yrs & up 08/27/2022, 07/15/2023   Pfizer(Comirnaty)Fall Seasonal Vaccine 12 years and older 07/15/2023   Pneumococcal Conjugate-13 02/03/2015   Pneumococcal Polysaccharide-23 11/23/2016   Td 11/12/2011   Tdap 11/12/2011, 05/27/2023   Zoster Recombinant(Shingrix) 05/27/2023, 08/02/2023   Past Medical History:  Diagnosis Date   At risk for sleep apnea    Bilateral edema of lower extremity    left > right --  wear compression hose   Cervical spondylosis    Chronic constipation    Cystitis [id number]'V   Dr Lorenso Courier - here    GERD (gastroesophageal reflux disease)    History of colon polyps    History of idiopathic seizure    AGE 32 to 20  X5  --  UNKNOW IDIOLOGY , PER PT WAS ANEMIC AT THE TIME--  NONE SINCE   History of tachycardia    S/P  RADIOACTIVE IODINE ABLATION OF THYROID   Hyperlipidemia    Hypertension    Hypothyroidism, postradioiodine therapy    AGE 55   IBS (irritable bowel syndrome)    Neuropathy    OA (osteoarthritis)    shoulders  left > right   PMB (postmenopausal bleeding)    S/P radioactive iodine thyroid ablation    Urge urinary incontinence    Varicose  veins    Vein disorder    LEFT ANKLE VEIN VALVE REFLUX WITH DECREASED CIRCULATION    Social History   Socioeconomic History   Marital status: Married    Spouse name: Kathlene November   Number of children: 1   Years of education: 16   Highest education level: Bachelor's degree (e.g., BA, AB, BS)  Occupational History   Occupation: Retired     Comment: Runner, broadcasting/film/video  Tobacco Use   Smoking status: Never   Smokeless tobacco: Never  Vaping Use   Vaping status: Never Used  Substance and Sexual Activity   Alcohol use: No   Drug use: No   Sexual activity: Not Currently    Birth control/protection: None    Comment: not much  Other Topics Concern   Not on file  Social History Narrative   Lives with husband in a one story home. Back end of home has steps leading out to a deck. Grandson stays with them 5 days per week while his mother works   Her husband is very HOH, had recent brain surgery, she's his caretaker   Right handed   Drinks a lot of caffiene   Social Drivers of Health   Financial Resource Strain: Low Risk  (01/03/2024)   Overall Financial Resource Strain (CARDIA)    Difficulty of Paying Living Expenses: Not hard at all  Food Insecurity: No Food Insecurity (01/03/2024)   Hunger Vital Sign    Worried About Running Out of Food in the  Last Year: Never true    Ran Out of Food in the Last Year: Never true  Transportation Needs: No Transportation Needs (01/03/2024)   PRAPARE - Administrator, Civil Service (Medical): No    Lack of Transportation (Non-Medical): No  Physical Activity: Insufficiently Active (01/10/2024)   Exercise Vital Sign    Days of Exercise per Week: 4 days    Minutes of Exercise per Session: 30 min  Stress: Stress Concern Present (01/03/2024)   [employer] of Occupational Health - Occupational Stress Questionnaire    Feeling of Stress : Very much  Social Connections: Unknown (01/03/2024)   Social Connection and Isolation Panel [NHANES]    Frequency of  Communication with Friends and Family: More than three times a week    Frequency of Social Gatherings with Friends and Family: Once a week    Attends Religious Services: Patient declined    Database administrator or Organizations: No    Attends Banker Meetings: Never    Marital Status: Married  Catering manager Violence: Not At Risk (04/18/2023)   Humiliation, Afraid, Rape, and Kick questionnaire    Fear of Current or Ex-Partner: No    Emotionally Abused: No    Physically Abused: No    Sexually Abused: No   Past Surgical History:  Procedure Laterality Date   CATARACT EXTRACTION W/ INTRAOCULAR LENS  IMPLANT, BILATERAL  right 2011//  left 2013   CESAREAN SECTION  1979   CLOSED LEFT KNEE MANIPULATION AND RIGHT KNEE CORTISONE INJECTION  02-20-2010   COLONOSCOPY W/ POLYPECTOMY  10-25-2008   DILATION AND CURETTAGE OF UTERUS     ENDOVENOUS ABLATION SAPHENOUS VEIN W/ LASER  04/2007   EYE SURGERY Bilateral    cataract   EYE SURGERY Bilateral    eyelid lifts /// left eye - placed removed   HYSTEROSCOPY WITH D & C N/A 11/11/2014   Procedure: DILATATION AND CURETTAGE /HYSTEROSCOPY;  Surgeon: Miguel Aschoff, MD;  Location: [facility];  Service: Gynecology;  Laterality: N/A;   JOINT REPLACEMENT     LAPAROSCOPIC CHOLECYSTECTOMY  1993   and BILATERAL TUBAL LIGATION   LEFT KNEE ARTHROTOMY W/ LYSIS ADHESIONS  12-01-2010   MOUTH SURGERY     dental implants   TONSILLECTOMY  1955   TOTAL KNEE ARTHROPLASTY Left 12-19-2009   TOTAL KNEE ARTHROPLASTY Right 02/13/2016   Procedure: RIGHT TOTAL KNEE ARTHROPLASTY;  Surgeon: Ollen Gross, MD;  Location: [facility];  Service: Orthopedics;  Laterality: Right;   TUBAL LIGATION     VARICOSE VEIN SURGERY  1998 to 2010   includes laser and phlebectomies   Family History  Problem Relation Age of Onset   COPD Mother        smoked   Parkinsonism Mother    Arthritis Mother    Epilepsy Mother        early in 65's   Myasthenia gravis Father     Asthma Father    Emphysema Father        smoked   Glaucoma Father    Arthritis Brother        knees   Heart disease Brother    Diabetes Brother    Hyperlipidemia Brother    Hyperlipidemia Brother    Hyperlipidemia Brother    Heart disease Brother    Hyperlipidemia Brother    Epilepsy Daughter 52   Diabetes Daughter    Kidney disease Maternal Uncle    CAD Paternal Grandmother 28   Uterine  cancer Paternal Grandmother    Colon cancer Neg Hx    Esophageal cancer Neg Hx    Stomach cancer Neg Hx    Rectal cancer Neg Hx     Current Outpatient Medications:    chlorthalidone (HYGROTON) 25 MG tablet, Take 1 tablet (25 mg total) by mouth daily as needed (swelling)., Disp: 90 tablet, Rfl: 3   Cholecalciferol (VITAMIN D) 2000 units CAPS, Take by mouth., Disp: , Rfl:    levothyroxine (SYNTHROID) 75 MCG tablet, Take 1 tablet daily. **change in dose, Disp: 90 tablet, Rfl: 3   losartan (COZAAR) 25 MG tablet, TAKE 1 TABLET (25 MG TOTAL) BY MOUTH DAILY., Disp: 90 tablet, Rfl: 0   Multiple Vitamins-Minerals (CENTRUM SILVER PO), Take 1 tablet by mouth daily., Disp: , Rfl:    Propylene Glycol (SYSTANE BALANCE OP), Apply 1 drop to eye 2 (two) times daily as needed (Dry eyes). Reported on 01/31/2016, Disp: , Rfl:    rosuvastatin (CRESTOR) 40 MG tablet, TAKE 1 TABLET BY MOUTH EVERY DAY, Disp: 90 tablet, Rfl: 1   Triamcinolone Acetonide (TRIAMCINOLONE 0.1 % CREAM : EUCERIN) CREA, Apply 1 application topically 2 (two) times daily., Disp: , Rfl:    vitamin B-12 (CYANOCOBALAMIN) 1000 MCG tablet, Take 1,000 mcg by mouth every other day. Take one tablet every other day, Disp: , Rfl:   Allergies  Allergen Reactions   Clindamycin/Lincomycin Other (See Comments)    Dizzy spells   Codeine Nausea And Vomiting   Crestor [Rosuvastatin Calcium] Other (See Comments)    Aching flu like large dose    Lipitor [Atorvastatin Calcium] Other (See Comments)    Numbness in feet    Pravachol Other (See Comments)     Back pain     Simvastatin Other (See Comments)    Back pain     Ultram [Tramadol Hcl] Nausea And Vomiting   Vioxx [Rofecoxib] Other (See Comments)    Fluid rentention , elevated blood pressure    Feldene [Piroxicam] Rash   Myrbetriq [Mirabegron] Other (See Comments)    Constipation, headache   Penicillins Rash    Pt states she CAN take Keflex   Protonix [Pantoprazole] Rash   Toviaz [Fesoterodine Fumarate Er] Other (See Comments)    Dry skin, dry eyes, constipation     ROS: Review of Systems A comprehensive review of systems was negative except for: Genitourinary: positive for frequency and scant vaginal bleeding    Physical exam BP (!) 143/71   Pulse 60   Temp 98.4 F (36.9 C)   Ht 5\' 3"  (1.6 m)   Wt 193 lb 12.8 oz (87.9 kg)   LMP 04/29/1991   SpO2 100%   BMI 34.33 kg/m  General appearance: alert, cooperative, appears stated age, no distress, and moderately obese Head: Normocephalic, without obvious abnormality, atraumatic Eyes: negative findings: lids and lashes normal, conjunctivae and sclerae normal, corneas clear, and pupils equal, round, reactive to light and accomodation Ears: normal TM's and external ear canals both ears Nose: Nares normal. Septum midline. Mucosa normal. No drainage or sinus tenderness. Throat: lips, mucosa, and tongue normal; teeth and gums normal Neck: no adenopathy, supple, symmetrical, trachea midline, and thyroid not enlarged, symmetric, no tenderness/mass/nodules Back:  Increased kyphosis of the thoracic spine.  Antalgic gait, utilizing cane for ambulation Lungs: clear to auscultation bilaterally Heart: regular rate and rhythm, S1, S2 normal, no murmur, click, rub or gallop Abdomen:  Mild generalized tenderness in the lower quadrants Extremities:  Compression hose in place.  Has some nodular  changes to the anterior shins on the left greater than right Pulses: 2+ and symmetric Skin:  As above Lymph nodes: Cervical, supraclavicular, and  axillary nodes normal. Neurologic: Grossly normal      01/10/2024    1:28 PM 05/27/2023   12:57 PM 05/27/2023   12:49 PM  Depression screen PHQ 2/9  Decreased Interest 0 0 0  Down, Depressed, Hopeless 0 0 0  PHQ - 2 Score 0 0 0  Altered sleeping  3 0  Tired, decreased energy  1 0  Change in appetite  0 0  Feeling bad or failure about yourself   0 0  Trouble concentrating  0 0  Moving slowly or fidgety/restless  3 0  Suicidal thoughts  0 0  PHQ-9 Score  7 0  Difficult doing work/chores  Somewhat difficult Not difficult at all      05/27/2023   12:50 PM 05/25/2022    1:05 PM 05/17/2022    1:17 PM 11/27/2021    4:11 PM  GAD 7 : Generalized Anxiety Score  Nervous, Anxious, on Edge 1 1 1  0  Control/stop worrying 1 0 0 0  Worry too much - different things 0 0 0 0  Trouble relaxing 0 1 0 0  Restless 0 0 0 0  Easily annoyed or irritable 0 0 0 0  Afraid - awful might happen 1 0 1 0  Total GAD 7 Score 3 2 2  0  Anxiety Difficulty Not difficult at all Not difficult at all Not difficult at all Not difficult at all     Assessment/ Plan: Miranda Wilson here for annual physical exam.   Annual physical exam  Postmenopausal bleeding - Plan: US Pelvic Complete With Transvaginal  Urinary incontinence in female - Plan: Incontinence supply  Stress due to illness of family member  Candidal intertrigo - Plan: nystatin cream (MYCOSTATIN)  Vitamin D deficiency - Plan: VITAMIN D 25 Hydroxy (Vit-D Deficiency, Fractures), VITAMIN D 25 Hydroxy (Vit-D Deficiency, Fractures)  B12 deficiency - Plan: Vitamin B12, CBC, CBC, Vitamin B12  Pure hypercholesterolemia - Plan: CMP14+EGFR, Lipid panel, Lipid panel, CMP14+EGFR, rosuvastatin (CRESTOR) 40 MG tablet  Postablative hypothyroidism - Plan: TSH + free T4, TSH + free T4, levothyroxine (SYNTHROID) 75 MCG tablet  Primary hypertension - Plan: CMP14+EGFR, CMP14+EGFR, chlorthalidone (HYGROTON) 25 MG tablet, losartan (COZAAR) 25 MG tablet  Need for  hepatitis C screening test - Plan: Hepatitis C antibody, Hepatitis C antibody  Given reports of scant postmenopausal bleeding that has not occurred for a while now, I am ordering stat transvaginal ultrasound to rule out concerning features like malignancy.  Famhx positive for uterine cancer in her maternal grandma.  We discussed need for eval with OB/GYN and she wants to check with one of her family members to see who they see before she makes a selection.  Was previously seen by a doctor in [place] who is since retired.  She is not sexually active.  She reports urinary frequency today which was suspected to be related to an overactive bladder/incontinence.  I will see if I can get her some bladder supplies  She will schedule follow-up with urology as soon as she is able to  Nystatin ordered but not having active yeast infection  Having some stress related to the care of her husband.  Could consider integrated behavioral health for counseling services at some point should she desire  Fasting labs collected today.  She will continue all medicines as prescribed  Counseled on healthy  lifestyle choices, including diet (rich in fruits, vegetables and lean meats and low in salt and simple carbohydrates) and exercise (at least 30 minutes of moderate physical activity daily).  Patient to follow up 72m  Miranda Wilson M. Nadine Counts, DO

## 2024-01-10 NOTE — Patient Instructions (Signed)
 Abnormal Uterine Bleeding    Abnormal uterine bleeding means bleeding more than normal from your womb (uterus). It can include:  Bleeding after sex.  Bleeding between monthly (menstrual) periods.  Bleeding that is heavier than normal.  Monthly periods that last longer than normal.  Bleeding after you have stopped having your monthly period (menopause).  You should see a doctor for any kind of bleeding that is not normal. Treatment depends on the cause of your bleeding and how much you bleed.  Follow these instructions at home:  Medicines  Take over-the-counter and prescription medicines only as told by your doctor.  Ask your doctor about:  Taking medicines such as aspirin and ibuprofen. Do not take these medicines unless your doctor tells you to take them.  Taking over-the-counter medicines, vitamins, herbs, and supplements.  You may be given iron pills. Take them as told by your doctor.  Managing constipation  If you take iron pills, you may need to take these actions to prevent or treat trouble pooping (constipation):  Drink enough fluid to keep your pee (urine) pale yellow.  Take over-the-counter or prescription medicines.  Eat foods that are high in fiber. These include beans, whole grains, and fresh fruits and vegetables.  Limit foods that are high in fat and sugar. These include fried or sweet foods.  Activity  Change your activity to decrease bleeding if you need to change your sanitary pad more than one time every 2 hours:  Lie in bed with your feet raised (elevated).  Place a cold pack on your lower belly.  Rest as much as you are able until the bleeding stops or slows down.  General instructions  Do not use tampons, douche, or have sex until your doctor says these things are okay.  Change your pads often.  Get regular exams. These include:  Pelvic exams.  Screenings for cancer of the cervix.  It is up to you to get the results of any tests that are done. Ask how to get your results when they are  ready.  Watch for any changes in your bleeding. For 2 months, write down:  When your monthly period starts.  When your monthly period ends.  When you get any abnormal bleeding from your vagina.  What problems you notice.  Keep all follow-up visits.  Contact a doctor if:  The bleeding lasts more than one week.  You feel dizzy at times.  You feel like you may vomit (nausea).  You vomit.  You feel light-headed or weak.  Your symptoms get worse.  Get help right away if:  You faint.  You have to change pads every hour.  You have pain in your belly.  You have a fever or chills.  You get sweaty or weak.  You pass large blood clots from your vagina.  These symptoms may be an emergency. Get help right away. Call your local emergency services (911 in the [place]  Do not wait to see if the symptoms will go away.  Do not drive yourself to the hospital.  Summary  Abnormal uterine bleeding means bleeding more than normal from your womb (uterus).  Any kind of bleeding that is not normal should be checked by a doctor.  Treatment depends on the cause of your bleeding and how much you bleed.  Get help right away if you faint, you have to change pads every hour, or you pass large blood clots from your vagina.  This information is not intended to replace  advice given to you by your health care provider. Make sure you discuss any questions you have with your health care provider.  Document Revised: 02/14/2021 Document Reviewed: 02/14/2021  Elsevier Patient Education  2024 [employer].

## 2024-01-11 LAB — CBC
Hematocrit: 41.8 % (ref 34.0–46.6)
Hemoglobin: 13.6 g/dL (ref 11.1–15.9)
MCH: 28.8 pg (ref 26.6–33.0)
MCHC: 32.5 g/dL (ref 31.5–35.7)
MCV: 89 fL (ref 79–97)
Platelets: 182 10*3/uL (ref 150–450)
RBC: 4.72 x10E6/uL (ref 3.77–5.28)
RDW: 13.8 % (ref 11.7–15.4)
WBC: 5.1 10*3/uL (ref 3.4–10.8)

## 2024-01-11 LAB — CMP14+EGFR
ALT: 13 IU/L (ref 0–32)
AST: 24 IU/L (ref 0–40)
Albumin: 4.3 g/dL (ref 3.8–4.8)
Alkaline Phosphatase: 109 IU/L (ref 44–121)
BUN/Creatinine Ratio: 13 (ref 12–28)
BUN: 14 mg/dL (ref 8–27)
Bilirubin Total: 0.3 mg/dL (ref 0.0–1.2)
CO2: 23 mmol/L (ref 20–29)
Calcium: 9.6 mg/dL (ref 8.7–10.3)
Chloride: 104 mmol/L (ref 96–106)
Creatinine, Ser: 1.1 mg/dL — ABNORMAL HIGH (ref 0.57–1.00)
Globulin, Total: 2.1 g/dL (ref 1.5–4.5)
Glucose: 75 mg/dL (ref 70–99)
Potassium: 4.5 mmol/L (ref 3.5–5.2)
Sodium: 141 mmol/L (ref 134–144)
Total Protein: 6.4 g/dL (ref 6.0–8.5)
eGFR: 53 mL/min/{1.73_m2} — ABNORMAL LOW (ref >59)

## 2024-01-11 LAB — LIPID PANEL
Chol/HDL Ratio: 2.5 ratio (ref 0.0–4.4)
Cholesterol, Total: 174 mg/dL (ref 100–199)
HDL: 69 mg/dL (ref >39)
LDL Chol Calc (NIH): 89 mg/dL (ref 0–99)
Triglycerides: 90 mg/dL (ref 0–149)
VLDL Cholesterol Cal: 16 mg/dL (ref 5–40)

## 2024-01-11 LAB — VITAMIN D 25 HYDROXY (VIT D DEFICIENCY, FRACTURES): Vit D, 25-Hydroxy: 58.3 ng/mL (ref 30.0–100.0)

## 2024-01-11 LAB — TSH+FREE T4
Free T4: 1.09 ng/dL (ref 0.82–1.77)
TSH: 1.44 u[IU]/mL (ref 0.450–4.500)

## 2024-01-11 LAB — HEPATITIS C ANTIBODY: Hep C Virus Ab: NONREACTIVE

## 2024-01-11 LAB — VITAMIN B12: Vitamin B-12: 477 pg/mL (ref 232–1245)

## 2024-01-13 ENCOUNTER — Telehealth: Payer: Self-pay | Admitting: Family Medicine

## 2024-01-13 ENCOUNTER — Ambulatory Visit (HOSPITAL_BASED_OUTPATIENT_CLINIC_OR_DEPARTMENT_OTHER)
Admission: RE | Admit: 2024-01-13 | Discharge: 2024-01-13 | Disposition: A | Discharge status: Home / Self Care | Source: Ambulatory Visit | Attending: Family Medicine | Admitting: Family Medicine

## 2024-01-13 DIAGNOSIS — N950 Postmenopausal bleeding: Secondary | ICD-10-CM

## 2024-01-13 DIAGNOSIS — D259 Leiomyoma of uterus, unspecified: Secondary | ICD-10-CM | POA: Diagnosis not present

## 2024-01-13 DIAGNOSIS — N939 Abnormal uterine and vaginal bleeding, unspecified: Secondary | ICD-10-CM | POA: Diagnosis not present

## 2024-01-13 DIAGNOSIS — R9389 Abnormal findings on diagnostic imaging of other specified body structures: Secondary | ICD-10-CM | POA: Diagnosis not present

## 2024-01-13 DIAGNOSIS — N858 Other specified noninflammatory disorders of uterus: Secondary | ICD-10-CM | POA: Diagnosis not present

## 2024-01-13 NOTE — Telephone Encounter (Unsigned)
 Copied from CRM [id number]. Topic: Referral - Question >> Jan 13, 2024 11:16 AM Miranda Wilson wrote: Reason for CRM: The patient had an appointment on 3/14 and spoke to Dr. Nadine Counts regarding an OB/GYN referral and she is requesting to be referred to Dr. Delynn Flavin personal OB/GYN in [place] near [place]. The patient's call back number is [phone] if needed.

## 2024-01-14 ENCOUNTER — Encounter: Payer: Self-pay | Admitting: Family Medicine

## 2024-01-14 NOTE — Addendum Note (Signed)
 Addended by: Raliegh Ip on: 01/14/2024 10:03 AM   Modules accepted: Orders

## 2024-01-17 ENCOUNTER — Encounter: Payer: Self-pay | Admitting: Family Medicine

## 2024-01-30 ENCOUNTER — Encounter: Payer: Self-pay | Admitting: Obstetrics & Gynecology

## 2024-01-30 ENCOUNTER — Ambulatory Visit: Admitting: Obstetrics & Gynecology

## 2024-01-30 ENCOUNTER — Other Ambulatory Visit (HOSPITAL_COMMUNITY)
Admission: RE | Admit: 2024-01-30 | Discharge: 2024-01-30 | Disposition: A | Discharge status: Home / Self Care | Source: Ambulatory Visit | Attending: Obstetrics & Gynecology | Admitting: Obstetrics & Gynecology

## 2024-01-30 VITALS — BP 197/79 | HR 54 | Ht 63.0 in | Wt 193.4 lb

## 2024-01-30 DIAGNOSIS — I10 Essential (primary) hypertension: Secondary | ICD-10-CM

## 2024-01-30 DIAGNOSIS — R9389 Abnormal findings on diagnostic imaging of other specified body structures: Secondary | ICD-10-CM | POA: Diagnosis not present

## 2024-01-30 DIAGNOSIS — N950 Postmenopausal bleeding: Secondary | ICD-10-CM | POA: Insufficient documentation

## 2024-01-30 NOTE — Progress Notes (Signed)
 GYN VISIT Patient name: Miranda Wilson MRN [id number]  Date of birth: [birth year] Chief Complaint:   Postmenopausal Bleeding  History of Present Illness:   Miranda Wilson is a 73 y.o. G1P1001 PM female being seen today for the following concerns:.     PMB:  Notes a few mos ago had a small about of bleeding.  Not even a teasponful, mostly noticeable on the pad.  Even unsure of source of bleeding; however, when wiped noted some spotting on toilet paper- mostly pink-tinged.    Denies vaginal bleeding since that time.  Denies vaginal itching, discharge or irritation.  Denies pelvic or abdominal pain.  Recent US showed: 12/2023: 7.3 x 4 x  3.5- 56cc  endometrium measuring 6.4mm  Pt included paperwork with gyn history. C-section x 1 H/o BTL H/o HRT until 2004 Followed by Urology due to IC and incontinence- discontinued Toviaz and Myrbetriq but noted constipation.  Still struggling with incontinence and has plans to follow up with urology    Patient's last menstrual period was 04/29/1991.   Review of Systems:   Pertinent items are noted in HPI Denies fever/chills, dizziness, headaches, visual disturbances, fatigue, shortness of breath, chest pain, abdominal pain, vomiting.  Not sexually active. Pertinent History Reviewed:   Past Surgical History:  Procedure Laterality Date   CATARACT EXTRACTION W/ INTRAOCULAR LENS  IMPLANT, BILATERAL  right 2011//  left 2013   CESAREAN SECTION  1979   CLOSED LEFT KNEE MANIPULATION AND RIGHT KNEE CORTISONE INJECTION  02-20-2010   COLONOSCOPY W/ POLYPECTOMY  10-25-2008   DILATION AND CURETTAGE OF UTERUS     ENDOVENOUS ABLATION SAPHENOUS VEIN W/ LASER  04/2007   EYE SURGERY Bilateral    cataract   EYE SURGERY Bilateral    eyelid lifts /// left eye - placed removed   HYSTEROSCOPY WITH D & C N/A 11/11/2014   Procedure: DILATATION AND CURETTAGE /HYSTEROSCOPY;  Surgeon: Miguel Aschoff, MD;  Location: [facility];  Service: Gynecology;   Laterality: N/A;   JOINT REPLACEMENT     LAPAROSCOPIC CHOLECYSTECTOMY  1993   and BILATERAL TUBAL LIGATION   LEFT KNEE ARTHROTOMY W/ LYSIS ADHESIONS  12-01-2010   MOUTH SURGERY     dental implants   TONSILLECTOMY  1955   TOTAL KNEE ARTHROPLASTY Left 12-19-2009   TOTAL KNEE ARTHROPLASTY Right 02/13/2016   Procedure: RIGHT TOTAL KNEE ARTHROPLASTY;  Surgeon: Ollen Gross, MD;  Location: [facility];  Service: Orthopedics;  Laterality: Right;   TUBAL LIGATION     VARICOSE VEIN SURGERY  1998 to 2010   includes laser and phlebectomies    Past Medical History:  Diagnosis Date   At risk for sleep apnea    Bilateral edema of lower extremity    left > right --  wear compression hose   Cervical spondylosis    Chronic constipation    Cystitis 6962-9528'U   Dr Lorenso Courier - here    GERD (gastroesophageal reflux disease)    History of colon polyps    History of idiopathic seizure    AGE 32 to 20  X5  --  UNKNOW IDIOLOGY , PER PT WAS ANEMIC AT THE TIME--  NONE SINCE   History of tachycardia    S/P  RADIOACTIVE IODINE ABLATION OF THYROID   Hyperlipidemia    Hypertension    Hypothyroidism, postradioiodine therapy    AGE 13   IBS (irritable bowel syndrome)    Neuropathy    OA (osteoarthritis)    shoulders  left >  right   PMB (postmenopausal bleeding)    S/P radioactive iodine thyroid ablation    Urge urinary incontinence    Varicose veins    Vein disorder    LEFT ANKLE VEIN VALVE REFLUX WITH DECREASED CIRCULATION    Reviewed problem list, medications and allergies. Physical Assessment:   Vitals:   01/30/24 1332 01/30/24 1355  BP: (!) 163/80 (!) 197/79  Pulse: (!) 54   Weight: 193 lb 6.4 oz (87.7 kg)   Height: 5\' 3"  (1.6 m)   Body mass index is 34.26 kg/m.       Physical Examination:   General appearance: alert, well appearing, and in no distress  Psych: mood appropriate, normal affect  Skin: warm & dry   Cardiovascular: normal heart rate noted  Respiratory: normal respiratory  effort, no distress  Abdomen: soft, non-tender, no rebound, no guarding  Pelvic: VULVA: normal appearing vulva with no masses, tenderness or lesions, VAGINA: normal appearing vagina with normal color and discharge, no lesions, CERVIX: normal appearing cervix without discharge or lesions, UTERUS: uterus is normal size, shape, consistency and nontender, ADNEXA: normal adnexa in size, nontender and no masses  Extremities: minimal edema, some limited mobility due to prior knee surgery  Chaperone: Faith Rogue    Endometrial Biopsy Procedure Note  Pre-operative Diagnosis: postmenopausal bleeding, thickened endometrium  Post-operative Diagnosis: same  Procedure Details  The risks (including infection, bleeding, pain, and uterine perforation) and benefits of the procedure were explained to the patient and Written informed consent was obtained.  Antibiotic prophylaxis against endocarditis was not indicated.   The patient was placed in the dorsal lithotomy position.  Bimanual exam showed the uterus to be in the neutral position.  A speculum inserted in the vagina, and the cervix prepped with betadine.     A single tooth tenaculum was applied to the anterior lip of the cervix for stabilization.  Os finder was used.  A Pipelle endometrial aspirator was used to sample the endometrium.  Sample was sent for pathologic examination.  Condition: Stable  Complications: None   Assessment & Plan:  1) Postmenopausal bleeding, Thickened endometrium -reviewed [place] report and recommendation to proceed with EMB -consent obtained, procedure as above -Next step pending results of pathology.   The patient was advised to call for any fever or for prolonged or severe pain or bleeding. She was advised to use OTC analgesics as needed for mild to moderate pain.  2) chronic HTN-elevated BP today  Return if symptoms worsen or fail to improve.   Myna Hidalgo, DO Attending Obstetrician & Gynecologist, [facility] for [employer], [facility]

## 2024-02-02 ENCOUNTER — Other Ambulatory Visit: Payer: Self-pay | Admitting: Family Medicine

## 2024-02-02 DIAGNOSIS — E890 Postprocedural hypothyroidism: Secondary | ICD-10-CM

## 2024-02-03 LAB — SURGICAL PATHOLOGY

## 2024-02-04 ENCOUNTER — Encounter: Payer: Self-pay | Admitting: Obstetrics & Gynecology

## 2024-02-24 ENCOUNTER — Encounter (INDEPENDENT_AMBULATORY_CARE_PROVIDER_SITE_OTHER): Payer: Self-pay | Admitting: Family Medicine

## 2024-02-24 DIAGNOSIS — M79605 Pain in left leg: Secondary | ICD-10-CM

## 2024-02-24 DIAGNOSIS — R229 Localized swelling, mass and lump, unspecified: Secondary | ICD-10-CM

## 2024-02-24 DIAGNOSIS — M79604 Pain in right leg: Secondary | ICD-10-CM | POA: Diagnosis not present

## 2024-02-24 NOTE — Telephone Encounter (Signed)

## 2024-04-21 ENCOUNTER — Other Ambulatory Visit: Payer: Self-pay | Admitting: *Deleted

## 2024-04-21 DIAGNOSIS — I872 Venous insufficiency (chronic) (peripheral): Secondary | ICD-10-CM

## 2024-04-30 DIAGNOSIS — L6611 Classic lichen planopilaris: Secondary | ICD-10-CM | POA: Diagnosis not present

## 2024-04-30 DIAGNOSIS — L718 Other rosacea: Secondary | ICD-10-CM | POA: Diagnosis not present

## 2024-04-30 DIAGNOSIS — D1801 Hemangioma of skin and subcutaneous tissue: Secondary | ICD-10-CM | POA: Diagnosis not present

## 2024-04-30 DIAGNOSIS — M793 Panniculitis, unspecified: Secondary | ICD-10-CM | POA: Diagnosis not present

## 2024-04-30 DIAGNOSIS — L603 Nail dystrophy: Secondary | ICD-10-CM | POA: Diagnosis not present

## 2024-04-30 DIAGNOSIS — I788 Other diseases of capillaries: Secondary | ICD-10-CM | POA: Diagnosis not present

## 2024-04-30 DIAGNOSIS — D2371 Other benign neoplasm of skin of right lower limb, including hip: Secondary | ICD-10-CM | POA: Diagnosis not present

## 2024-04-30 DIAGNOSIS — L304 Erythema intertrigo: Secondary | ICD-10-CM | POA: Diagnosis not present

## 2024-05-05 ENCOUNTER — Encounter: Payer: Self-pay | Admitting: Vascular Surgery

## 2024-05-05 ENCOUNTER — Ambulatory Visit (HOSPITAL_COMMUNITY)
Admission: RE | Admit: 2024-05-05 | Discharge: 2024-05-05 | Disposition: A | Discharge status: Home / Self Care | Source: Ambulatory Visit | Attending: Vascular Surgery | Admitting: Vascular Surgery

## 2024-05-05 ENCOUNTER — Ambulatory Visit: Attending: Vascular Surgery | Admitting: Vascular Surgery

## 2024-05-05 VITALS — BP 167/92 | HR 63 | Temp 98.3°F | Resp 18 | Ht 63.0 in | Wt 196.0 lb

## 2024-05-05 DIAGNOSIS — I872 Venous insufficiency (chronic) (peripheral): Secondary | ICD-10-CM

## 2024-05-05 NOTE — Progress Notes (Signed)
 Patient name: Miranda Wilson MRN: [id number] DOB: 24-Dec-1950 Sex: female  REASON FOR CONSULT: Left leg pain, hx venous disease   HPI: Miranda Wilson is a 73 y.o. female, with hx HTN, HLD, chronic venous insufficiency who presents for evaluation of left lower extremity pain with discoloration.  Patient has a complex history.  She had bilateral great saphenous vein ablations in 2008 with Dr. Gerlean according to her records.  She then had an additional left leg ablation in 2010 for an accessory great saphenous vein.  Has been very compliant wearing her thigh-high compression stockings.  She complains mainly of left leg discomfort down at the ankle where she has discoloration and a knot.  States this is very painful and does not want anybody touching it.  Past Medical History:  Diagnosis Date   At risk for sleep apnea    Bilateral edema of lower extremity    left > right --  wear compression hose   Cervical spondylosis    Chronic constipation    Cystitis 8011-8009'd   Dr Smith - here    GERD (gastroesophageal reflux disease)    History of colon polyps    History of idiopathic seizure    AGE 52 to 20  X5  --  UNKNOW IDIOLOGY , PER PT WAS ANEMIC AT THE TIME--  NONE SINCE   History of tachycardia    S/P  RADIOACTIVE IODINE ABLATION OF THYROID    Hyperlipidemia    Hypertension    Hypothyroidism, postradioiodine therapy    AGE 89   IBS (irritable bowel syndrome)    Neuropathy    OA (osteoarthritis)    shoulders  left > right   PMB (postmenopausal bleeding)    S/P radioactive iodine thyroid  ablation    Urge urinary incontinence    Varicose veins    Vein disorder    LEFT ANKLE VEIN VALVE REFLUX WITH DECREASED CIRCULATION     Past Surgical History:  Procedure Laterality Date   CATARACT EXTRACTION W/ INTRAOCULAR LENS  IMPLANT, BILATERAL  right 2011//  left 2013   CESAREAN SECTION  1979   CLOSED LEFT KNEE MANIPULATION AND RIGHT KNEE CORTISONE INJECTION  02-20-2010   COLONOSCOPY W/  POLYPECTOMY  10-25-2008   DILATION AND CURETTAGE OF UTERUS     ENDOVENOUS ABLATION SAPHENOUS VEIN W/ LASER  04/2007   EYE SURGERY Bilateral    cataract   EYE SURGERY Bilateral    eyelid lifts /// left eye - placed removed   HYSTEROSCOPY WITH D & C N/A 11/11/2014   Procedure: DILATATION AND CURETTAGE /HYSTEROSCOPY;  Surgeon: Peggye Gull, MD;  Location: [facility];  Service: Gynecology;  Laterality: N/A;   JOINT REPLACEMENT     LAPAROSCOPIC CHOLECYSTECTOMY  1993   and BILATERAL TUBAL LIGATION   LEFT KNEE ARTHROTOMY W/ LYSIS ADHESIONS  12-01-2010   MOUTH SURGERY     dental implants   TONSILLECTOMY  1955   TOTAL KNEE ARTHROPLASTY Left 12-19-2009   TOTAL KNEE ARTHROPLASTY Right 02/13/2016   Procedure: RIGHT TOTAL KNEE ARTHROPLASTY;  Surgeon: Dempsey Moan, MD;  Location: [facility];  Service: Orthopedics;  Laterality: Right;   TUBAL LIGATION     VARICOSE VEIN SURGERY  1998 to 2010   includes laser and phlebectomies    Family History  Problem Relation Age of Onset   COPD Mother        smoked   Parkinsonism Mother    Arthritis Mother    Epilepsy Mother  early in 20's   Myasthenia gravis Father    Asthma Father    Emphysema Father        smoked   Glaucoma Father    Arthritis Brother        knees   Heart disease Brother    Diabetes Brother    Hyperlipidemia Brother    Hyperlipidemia Brother    Hyperlipidemia Brother    Heart disease Brother    Hyperlipidemia Brother    Epilepsy Daughter 6   Diabetes Daughter    Kidney disease Maternal Uncle    CAD Paternal Grandmother 61   Uterine cancer Paternal Grandmother    Colon cancer Neg Hx    Esophageal cancer Neg Hx    Stomach cancer Neg Hx    Rectal cancer Neg Hx     SOCIAL HISTORY: Social History   Socioeconomic History   Marital status: Married    Spouse name: Garrel   Number of children: 1   Years of education: 16   Highest education level: Bachelor's degree (e.g., BA, AB, BS)  Occupational History    Occupation: Retired     Comment: Runner, broadcasting/film/video  Tobacco Use   Smoking status: Never   Smokeless tobacco: Never  Vaping Use   Vaping status: Never Used  Substance and Sexual Activity   Alcohol  use: No   Drug use: No   Sexual activity: Not Currently    Birth control/protection: None    Comment: not much  Other Topics Concern   Not on file  Social History Narrative   Lives with husband in a one story home. Back end of home has steps leading out to a deck. Grandson stays with them 5 days per week while his mother works   Her husband is very HOH, had recent brain surgery, she's his caretaker   Right handed   Drinks a lot of caffiene   Social Drivers of Health   Financial Resource Strain: Low Risk  (01/03/2024)   Overall Financial Resource Strain (CARDIA)    Difficulty of Paying Living Expenses: Not hard at all  Food Insecurity: No Food Insecurity (01/03/2024)   Hunger Vital Sign    Worried About Running Out of Food in the Last Year: Never true    Ran Out of Food in the Last Year: Never true  Transportation Needs: No Transportation Needs (01/03/2024)   PRAPARE - Administrator, Civil Service (Medical): No    Lack of Transportation (Non-Medical): No  Physical Activity: Insufficiently Active (01/10/2024)   Exercise Vital Sign    Days of Exercise per Week: 4 days    Minutes of Exercise per Session: 30 min  Stress: Stress Concern Present (01/03/2024)   [employer] of Occupational Health - Occupational Stress Questionnaire    Feeling of Stress : Very much  Social Connections: Unknown (01/03/2024)   Social Connection and Isolation Panel    Frequency of Communication with Friends and Family: More than three times a week    Frequency of Social Gatherings with Friends and Family: Once a week    Attends Religious Services: Patient declined    Database administrator or Organizations: No    Attends Banker Meetings: Never    Marital Status: Married  Catering manager  Violence: Not At Risk (04/18/2023)   Humiliation, Afraid, Rape, and Kick questionnaire    Fear of Current or Ex-Partner: No    Emotionally Abused: No    Physically Abused: No    Sexually Abused:  No    Allergies  Allergen Reactions   Clindamycin/Lincomycin Other (See Comments)    Dizzy spells   Codeine Nausea And Vomiting   Crestor  [Rosuvastatin  Calcium ] Other (See Comments)    Aching flu like large dose    Lipitor [Atorvastatin Calcium ] Other (See Comments)    Numbness in feet    Pravachol Other (See Comments)    Back pain     Simvastatin Other (See Comments)    Back pain     Ultram [Tramadol Hcl] Nausea And Vomiting   Vioxx [Rofecoxib] Other (See Comments)    Fluid rentention , elevated blood pressure    Feldene [Piroxicam] Rash   Myrbetriq [Mirabegron] Other (See Comments)    Constipation, headache   Penicillins Rash    Pt states she CAN take Keflex   Protonix  [Pantoprazole ] Rash   Toviaz [Fesoterodine Fumarate Er] Other (See Comments)    Dry skin, dry eyes, constipation    Current Outpatient Medications  Medication Sig Dispense Refill   cephALEXin (KEFLEX) 500 MG capsule Take 500 mg by mouth 4 (four) times daily. Take 2 capsules  before dental work     chlorthalidone  (HYGROTON ) 25 MG tablet Take 6.25 mg by mouth daily.     Cholecalciferol (VITAMIN D ) 2000 units CAPS Take by mouth.     levothyroxine  (SYNTHROID ) 75 MCG tablet TAKE 1 TABLET DAILY. **CHANGE IN DOSE 90 tablet 1   losartan  (COZAAR ) 25 MG tablet Take 1 tablet (25 mg total) by mouth daily. 90 tablet 3   Multiple Vitamins-Minerals (CENTRUM SILVER PO) Take 1 tablet by mouth daily.     nystatin  cream (MYCOSTATIN ) Apply 1 Application topically 2 (two) times daily. X10-14 days as needed for yeast rash under belly 30 g 1   Propylene Glycol (SYSTANE BALANCE OP) Apply 1 drop to eye 2 (two) times daily as needed (Dry eyes). Reported on 01/31/2016     rosuvastatin  (CRESTOR ) 40 MG tablet Take 1 tablet (40 mg total) by  mouth daily. 90 tablet 3   Triamcinolone  Acetonide (TRIAMCINOLONE  0.1 % CREAM : EUCERIN) CREA Apply 1 application topically 2 (two) times daily.     vitamin B-12 (CYANOCOBALAMIN ) 1000 MCG tablet Take 1,000 mcg by mouth every other day. Take one tablet every other day     No current facility-administered medications for this visit.    REVIEW OF SYSTEMS:  [X]  denotes positive finding, [ ]  denotes negative finding Cardiac  Comments:  Chest pain or chest pressure:    Shortness of breath upon exertion:    Short of breath when lying flat:    Irregular heart rhythm:        Vascular    Pain in calf, thigh, or hip brought on by ambulation:    Pain in feet at night that wakes you up from your sleep:     Blood clot in your veins:    Leg swelling:         Pulmonary    Oxygen at home:    Productive cough:     Wheezing:         Neurologic    Sudden weakness in arms or legs:     Sudden numbness in arms or legs:     Sudden onset of difficulty speaking or slurred speech:    Temporary loss of vision in one eye:     Problems with dizziness:         Gastrointestinal    Blood in stool:     Vomited blood:  Genitourinary    Burning when urinating:     Blood in urine:        Psychiatric    Major depression:         Hematologic    Bleeding problems:    Problems with blood clotting too easily:        Skin    Rashes or ulcers:        Constitutional    Fever or chills:      PHYSICAL EXAM: Vitals:   05/05/24 1524  BP: (!) 167/92  Pulse: 63  Resp: 18  Temp: 98.3 F (36.8 C)  TempSrc: Temporal  SpO2: 100%  Weight: 196 lb (88.9 kg)  Height: 5' 3 (1.6 m)    GENERAL: The patient is a well-nourished female, in no acute distress. The vital signs are documented above. CARDIAC: There is a regular rate and rhythm.  VASCULAR:  Left DP palpable Lipodermatosclerosis left leg as pictured with skin thickening PULMONARY: No respiratory distress. ABDOMEN: Soft and  non-tender. MUSCULOSKELETAL: There are no major deformities or cyanosis. NEUROLOGIC: No focal weakness or paresthesias are detected. PSYCHIATRIC: The patient has a normal affect.    DATA:   Lower Venous Reflux Study   Patient Name:  INITA URAM  Date of Exam:   05/05/2024  Medical Rec #: [id number]        Accession #:    [id number]  Date of Birth: 05/25/1951        Patient Gender: F  Patient Age:   71 years  Exam Location:  [address]  Procedure:      VAS US  LOWER EXTREMITY VENOUS REFLUX  Referring Phys: LONNI GASKINS    ---------------------------------------------------------------------------  -----    Indications: History of bilateral GSV insufficiency diagnosed in 02/2020.  Patient wearing thigh-high compression stockings today. Today, she states  left leg is worse with mid calf to foot discoloration with painful areas  along the lateral and posterior  calf. She denies chest pain and SOB.    Comparison Study: 03/16/2020 Bilat GSV insufficiency seen. No deep  insufficiency                   noted.   Performing Technologist: Annabella Cater RVT, RDCS (AE), RDMS     Examination Guidelines: A complete evaluation includes B-mode imaging,  spectral  Doppler, color Doppler, and power Doppler as needed of all accessible  portions  of each vessel. Bilateral testing is considered an integral part of a  complete  examination. Limited examinations for reoccurring indications may be  performed  as noted. The reflux portion of the exam is performed with the patient in  reverse Trendelenburg.  Significant venous reflux is defined as >500 ms in the superficial venous  system, and >1 second in the deep venous system.     +--------------+---------+------+-----------+------------+-----------------  ----+  LEFT         Reflux NoRefluxReflux TimeDiameter cmsComments                                        Yes                                                  +--------------+---------+------+-----------+------------+-----------------  ----+  CFV  no                                                            +--------------+---------+------+-----------+------------+-----------------  ----+  FV prox       no                                                            +--------------+---------+------+-----------+------------+-----------------  ----+  FV mid        no                                                            +--------------+---------+------+-----------+------------+-----------------  ----+  FV dist       no                                                            +--------------+---------+------+-----------+------------+-----------------  ----+  Popliteal    no                                                            +--------------+---------+------+-----------+------------+-----------------  ----+  GSV at [facility]              yes    >500 ms      1.14                            +--------------+---------+------+-----------+------------+-----------------  ----+  GSV prox thigh          yes    >500 ms      .55                             +--------------+---------+------+-----------+------------+-----------------  ----+  GSV mid thigh           yes    >500 ms      .31     perforator .19cm  w/                                                        insuff; OOF             +--------------+---------+------+-----------+------------+-----------------  ----+  GSV dist thigh  out of fascia           +--------------+---------+------+-----------+------------+-----------------  ----+  GSV at knee             yes    >500 ms      .67                             +--------------+---------+------+-----------+------------+-----------------  ----+  GSV prox calf           yes    >500 ms      .58                              +--------------+---------+------+-----------+------------+-----------------  ----+  GSV mid calf            yes    >500 ms      .43     perforator .29  cm w/                                                       insuff                  +--------------+---------+------+-----------+------------+-----------------  ----+  SSV prox calf           yes    >500 ms      .31                             +--------------+---------+------+-----------+------------+-----------------  ----+  SSV mid calf            yes    >500 ms      .22                             +--------------+---------+------+-----------+------------+-----------------  ----+     Summary:  Left:  - No evidence of deep vein thrombosis seen in the left lower extremity,  from the common femoral through the popliteal veins.  - No evidence of superficial venous thrombosis in the left lower  extremity.  - Venous reflux is noted in the left greater saphenous vein in the thigh.  - Venous reflux is noted in the left greater saphenous vein in the calf.  - Venous reflux is noted in the left short saphenous vein.  - Venous reflux is noted in the left perforator vein.  - Enlarged groin lymph nodes with increased skin thickness and superficial  edema in the posterior calf.    *See table(s) above for measurements and observations.   Electronically signed by Lonni Gaskins MD on 05/05/2024 at 3:57:28 PM.   Assessment/Plan:   73 y.o. female, with hx HTN, HLD, chronic venous insufficiency who presents for evaluation of left lower extremity pain with discoloration.  Patient has a complex history.  She had bilateral great saphenous vein ablations in 2008 with Dr. Gerlean.  She then had an additional left leg ablation in 2010 for an accessory great saphenous vein.  I again discussed the basic etiology of chronic venous insufficiency with valvular reflux.  Discussed basic conservative measures which  she understands already including leg elevation, exercise, compression stockings and maintaining a healthy weight.  She is very good about  wearing her thigh high stockings.  Interestingly still has a large refluxing great saphenous vein in the left leg even though she has had two prior ablations in this leg that sounds like the main great saphenous vein and then an anterior accessory.  I will have her follow-up in 3 months to see one of my partners to see if she would be a candidate for any further intervention like laser and stabs.  Not sure if her previously treated GSV recanalized or has another anterior accessory.  I do think a lot of her discomfort and pain is likely related to her venous insufficiency.     Lonni DOROTHA Gaskins, MD [facility] Office: [phone]

## 2024-06-24 ENCOUNTER — Ambulatory Visit: Payer: Self-pay

## 2024-06-24 ENCOUNTER — Encounter: Payer: Self-pay | Admitting: Family Medicine

## 2024-06-24 ENCOUNTER — Ambulatory Visit: Admitting: Family Medicine

## 2024-06-24 VITALS — BP 130/73 | HR 75 | Temp 97.4°F | Ht 63.0 in | Wt 197.2 lb

## 2024-06-24 DIAGNOSIS — L509 Urticaria, unspecified: Secondary | ICD-10-CM | POA: Diagnosis not present

## 2024-06-24 MED ORDER — BETAMETHASONE SOD PHOS & ACET 6 (3-3) MG/ML IJ SUSP
6.0000 mg | Freq: Once | INTRAMUSCULAR | Status: AC
  Administered 2024-06-24: 6 mg via INTRAMUSCULAR

## 2024-06-24 NOTE — Telephone Encounter (Signed)
 Has appointment today

## 2024-06-24 NOTE — Addendum Note (Signed)
 Addended by: JODENE AMI NOVAK on: 06/24/2024 03:14 PM   Modules accepted: Orders

## 2024-06-24 NOTE — Progress Notes (Signed)
 Subjective:  Patient ID: Miranda Wilson, female    DOB: 11/12/1950  Age: 73 y.o. MRN: [id number]  CC: Urticaria   HPI  Discussed the use of AI scribe software for clinical note transcription with the patient, who gave verbal consent to proceed.  History of Present Illness Miranda Wilson is a 73 year old female who presents with hives and itching.  She experiences hives primarily located at the waistline, specifically at the left hip area, extending around to the back of her legs. The hives are itchy, with the most severe itching around the elastic at the panty line and leg openings. The itching was severe this morning but subsided after taking Benadryl .  She took Benadryl  at 10:45 AM, which provided temporary relief from the itching. She also has a history of hives under her arm, which is not currently visible.  She recalls a history of hives dating back to the early 1990s when she was treated by a dermatologist with Hismanol, which she took daily for months. She has been prone to hives in the past, particularly on her arms.  No recent dietary changes, although she consumed a meal of center-cut pork chop with baked apples, cinnamon, and brown sugar prior to the onset of the hives. She has eaten this meal before without issue. She prepared the apples herself, peeling them after washing.  In the review of symptoms, the itching is currently absent due to the Benadryl , but she expects it to return.          06/24/2024    2:18 PM 01/10/2024    1:28 PM 05/27/2023   12:57 PM  Depression screen PHQ 2/9  Decreased Interest 0 0 0  Down, Depressed, Hopeless 1 0 0  PHQ - 2 Score 1 0 0  Altered sleeping 3  3  Tired, decreased energy 0  1  Change in appetite 1  0  Feeling bad or failure about yourself  0  0  Trouble concentrating 0  0  Moving slowly or fidgety/restless 1  3  Suicidal thoughts 0  0  PHQ-9 Score 6  7  Difficult doing work/chores Somewhat difficult  Somewhat difficult     History Miranda Wilson has a past medical history of At risk for sleep apnea, Bilateral edema of lower extremity, Cervical spondylosis, Chronic constipation, Cystitis (8011-8009'd), GERD (gastroesophageal reflux disease), History of colon polyps, History of idiopathic seizure, History of tachycardia, Hyperlipidemia, Hypertension, Hypothyroidism, postradioiodine therapy, IBS (irritable bowel syndrome), Neuropathy, OA (osteoarthritis), PMB (postmenopausal bleeding), S/P radioactive iodine thyroid  ablation, Urge urinary incontinence, Varicose veins, and Vein disorder.   She has a past surgical history that includes Varicose vein surgery (1998 to 2010); Cesarean section (1979); Endovenous ablation saphenous vein w/ laser (04/2007); Tonsillectomy (1955); Colonoscopy w/ polypectomy (10-25-2008); Total knee arthroplasty (Left, 12-19-2009); CLOSED LEFT KNEE MANIPULATION AND RIGHT KNEE CORTISONE INJECTION (02-20-2010); LEFT KNEE ARTHROTOMY W/ LYSIS ADHESIONS (12-01-2010); Laparoscopic cholecystectomy (8006); Cataract extraction w/ intraocular lens  implant, bilateral (right 2011//  left 2013); Hysteroscopy with D & C (N/A, 11/11/2014); Dilation and curettage of uterus; Joint replacement; Total knee arthroplasty (Right, 02/13/2016); Tubal ligation; Eye surgery (Bilateral); Eye surgery (Bilateral); and Mouth surgery.   Her family history includes Arthritis in her brother and mother; Asthma in her father; CAD (age of onset: 70) in her paternal grandmother; COPD in her mother; Diabetes in her brother and daughter; Emphysema in her father; Epilepsy in her mother; Epilepsy (age of onset: 51) in her daughter; Glaucoma in her father;  Heart disease in her brother and brother; Hyperlipidemia in her brother, brother, brother, and brother; Kidney disease in her maternal uncle; Myasthenia gravis in her father; Parkinsonism in her mother; Uterine cancer in her paternal grandmother.She reports that she has never smoked. She has never used  smokeless tobacco. She reports that she does not drink alcohol  and does not use drugs.    ROS Review of Systems  Objective:  BP 130/73   Pulse 75   Temp (!) 97.4 F (36.3 C)   Ht 5' 3 (1.6 m)   Wt 197 lb 3.2 oz (89.4 kg)   LMP 04/29/1991   SpO2 99%   BMI 34.93 kg/m   BP Readings from Last 3 Encounters:  06/24/24 130/73  05/05/24 (!) 167/92  01/30/24 (!) 197/79    Wt Readings from Last 3 Encounters:  06/24/24 197 lb 3.2 oz (89.4 kg)  05/05/24 196 lb (88.9 kg)  01/30/24 193 lb 6.4 oz (87.7 kg)     Physical Exam  Physical Exam GENERAL: Alert, cooperative, well developed, no acute distress Soft, non-tender, non-distended, without organomegaly, normal bowel sounds EXTREMITIES: No cyanosis or edema NEUROLOGICAL: Cranial nerves grossly intact, moves all extremities without gross motor or sensory deficit SKIN: Hives present at waistline, left and right hip areas, and back of legs with associated pruritus   Assessment & Plan:  There are no diagnoses linked to this encounter.  Assessment and Plan Assessment & Plan Acute urticaria   She experiences acute urticaria with pruritus, mainly at the waistline, hips, and legs. Benadryl  provides temporary relief. Recent dietary intake, including pork chop with apples and cinnamon, may be a possible allergen, though no definitive cause is identified. Recurrent hives suggest a predisposition to urticaria. Potential allergens such as fresh apples, cinnamon, and pesticide residue were discussed. Administer a cortisone injection for acute symptom relief. Continue Benadryl  as needed for pruritus. Consider daily Zyrtec if urticaria becomes frequent. Monitor for recurrence patterns related to dietary intake. Consider referral to an allergy specialist if symptoms persist or worsen.  Recording duration: 7 minutes       Follow-up: Return if symptoms worsen or fail to improve.  Butler Der, M.D.

## 2024-06-24 NOTE — Telephone Encounter (Signed)
  Copied from CRM #[id number]. Topic: Clinical - Red Word Triage >> Jun 24, 2024 11:39 AM Harlene ORN wrote: Red Word that prompted transfer to Nurse Triage: hives started to spread from waist, to thighs, to pantyline, to under arms. irritating itching. Reason for Disposition  [1] MODERATE-SEVERE hives (e.g.,hives interfere with normal activities or work) AND [2] not improved after taking antihistamine (e.g., cetirizine, fexofenadine , or loratadine ) > 24 hours  Answer Assessment - Initial Assessment Questions 1. APPEARANCE: What does the rash look like?      Looks like little red bumps, they start small and get bigger, there are also some patches  2. LOCATION: Where is the rash located?      Around waist to thighs and panty line, also to back of legs, and under right arm  3. NUMBER: How many hives are there?      Several areas  4. SIZE: How big are the hives? (e.g., inches, cm, compare to coins) Do they all look the same or do they vary in shape and size?      Varying sizes  5. ONSET: When did the hives begin? (e.g., hours or days ago)      Started Monday night  6. ITCHING: Does it itch? If Yes, ask: How bad is the itch?  (e.g., none, mild, moderate, severe)     Yes, moderate itching  7. RECURRENT PROBLEM: Have you had hives before? If Yes, ask: When was the last time? and What happened that time?      Had a similar issues in the 90s  8. TRIGGERS: Were you exposed to any new food, plant, cosmetic product or animal just before the hives began?     Unsure of triggers. Has not used any new products or foods  9. OTHER SYMPTOMS: Do you have any other symptoms? (e.g., fever, tongue swelling, difficulty breathing, abdomen pain)     No  10. PREGNANCY: Is there any chance you are pregnant? When was your last menstrual period?       No  Protocols used: Hives-A-AH

## 2024-07-07 ENCOUNTER — Ambulatory Visit

## 2024-07-13 ENCOUNTER — Encounter: Payer: Self-pay | Admitting: Family Medicine

## 2024-07-13 ENCOUNTER — Ambulatory Visit: Admitting: Family Medicine

## 2024-07-13 VITALS — BP 144/75 | HR 58 | Temp 97.6°F | Ht 63.0 in | Wt 195.2 lb

## 2024-07-13 DIAGNOSIS — Z23 Encounter for immunization: Secondary | ICD-10-CM | POA: Diagnosis not present

## 2024-07-13 DIAGNOSIS — R3589 Other polyuria: Secondary | ICD-10-CM

## 2024-07-13 DIAGNOSIS — I10 Essential (primary) hypertension: Secondary | ICD-10-CM | POA: Diagnosis not present

## 2024-07-13 DIAGNOSIS — L603 Nail dystrophy: Secondary | ICD-10-CM

## 2024-07-13 DIAGNOSIS — Z6379 Other stressful life events affecting family and household: Secondary | ICD-10-CM | POA: Diagnosis not present

## 2024-07-13 DIAGNOSIS — I872 Venous insufficiency (chronic) (peripheral): Secondary | ICD-10-CM

## 2024-07-13 DIAGNOSIS — E890 Postprocedural hypothyroidism: Secondary | ICD-10-CM | POA: Diagnosis not present

## 2024-07-13 DIAGNOSIS — M545 Low back pain, unspecified: Secondary | ICD-10-CM

## 2024-07-13 DIAGNOSIS — D259 Leiomyoma of uterus, unspecified: Secondary | ICD-10-CM | POA: Insufficient documentation

## 2024-07-13 DIAGNOSIS — G8929 Other chronic pain: Secondary | ICD-10-CM | POA: Diagnosis not present

## 2024-07-13 LAB — BAYER DCA HB A1C WAIVED: HB A1C (BAYER DCA - WAIVED): 5.5 % (ref 4.8–5.6)

## 2024-07-13 MED ORDER — LOSARTAN POTASSIUM 50 MG PO TABS: 50.0000 mg | ORAL_TABLET | Freq: Every day | ORAL | 3 refills | Status: AC

## 2024-07-13 MED ORDER — COVID-19 MRNA VAC-TRIS(PFIZER) 30 MCG/0.3ML IM SUSY: 0.3000 mL | PREFILLED_SYRINGE | Freq: Once | INTRAMUSCULAR | 0 refills | Status: AC

## 2024-07-13 MED ORDER — BUSPIRONE HCL 5 MG PO TABS: ORAL_TABLET | ORAL | 0 refills | Status: AC

## 2024-07-13 NOTE — Progress Notes (Signed)
 Subjective: RR:hzwzmjo check up PCP: Miranda Norene HERO, DO YEP:Miranda Wilson is a 73 y.o. female presenting to clinic today for:  Discussed the use of AI scribe software for clinical note transcription with the patient, who gave verbal consent to proceed.  History of Present Illness   Miranda Wilson is a 73 year old female with hypertension who presents with concerns about elevated blood pressure and possible diabetes.  She has consistently elevated blood pressure readings, with recent measurements of 145/80, 140/83, and 144/75. She is currently taking losartan  25mg  once daily.  She is concerned about possible diabetes due to symptoms such as dizziness, increased fatigue, frequent urination, dry mouth, and dry eyes. She experienced significant dizziness yesterday, requiring her to lie down until it subsided. She also reports numbness in her toes and easy bruising.  She describes issues with her fingernails, noting splitting and detachment from the nail bed, and suspects it might be onycholysis. She also has fungal toenails.  She has ongoing issues with her left leg, including Slape shooting pains and knots posteriorly, despite wearing compression hose. An ultrasound was performed with Vascular Surgery, and she is scheduled to follow up with another vascular specialist in October. She elevates her feet daily to help with symptoms.  She reports significant stress related to caregiving for her husband, who had a stroke following heart surgery and requires ongoing therapy. She manages his medications and appointments, which adds to her stress. Additionally, her son-in-law's heart surgery and subsequent depression contribute to her stress.  She experienced a recent episode of welts and itching, which started around her waistband and spread. She was treated with a cortisone shot and Zyrtec, which resolved the symptoms. No specific cause was identified.  She has dental issues, including a  split in the enamel of a front tooth, which was previously treated with a root canal. Her dentist suspects an abscess, and she is scheduled to see an endodontist soon.      ROS: Per HPI  Allergies  Allergen Reactions   Clindamycin/Lincomycin Other (See Comments)    Dizzy spells   Codeine Nausea And Vomiting   Crestor  [Rosuvastatin  Calcium ] Other (See Comments)    Aching flu like large dose    Lipitor [Atorvastatin Calcium ] Other (See Comments)    Numbness in feet    Pravachol Other (See Comments)    Back pain     Simvastatin Other (See Comments)    Back pain     Ultram [Tramadol Hcl] Nausea And Vomiting   Vioxx [Rofecoxib] Other (See Comments)    Fluid rentention , elevated blood pressure    Feldene [Piroxicam] Rash   Myrbetriq [Mirabegron] Other (See Comments)    Constipation, headache   Penicillins Rash    Pt states she CAN take Keflex   Protonix  [Pantoprazole ] Rash   Toviaz [Fesoterodine Fumarate Er] Other (See Comments)    Dry skin, dry eyes, constipation   Past Medical History:  Diagnosis Date   At risk for sleep apnea    Bilateral edema of lower extremity    left > right --  wear compression hose   Cervical spondylosis    Chronic constipation    Cystitis [id number]'d   Dr Smith - here    GERD (gastroesophageal reflux disease)    History of colon polyps    History of idiopathic seizure    AGE 64 to 20  X5  --  UNKNOW IDIOLOGY , PER PT WAS ANEMIC AT THE TIME--  NONE SINCE  History of tachycardia    S/P  RADIOACTIVE IODINE ABLATION OF THYROID    Hyperlipidemia    Hypertension    Hypothyroidism, postradioiodine therapy    AGE 84   IBS (irritable bowel syndrome)    Neuropathy    OA (osteoarthritis)    shoulders  left > right   PMB (postmenopausal bleeding)    S/P radioactive iodine thyroid  ablation    Urge urinary incontinence    Varicose veins    Vein disorder    LEFT ANKLE VEIN VALVE REFLUX WITH DECREASED CIRCULATION     Current Outpatient  Medications:    busPIRone  (BUSPAR ) 5 MG tablet, Take 1 tablet (5 mg total) by mouth 2 (two) times daily for 7 days, THEN 1.5 tablets (7.5 mg total) 2 (two) times daily for 7 days, THEN 2 tablets (10 mg total) 2 (two) times daily., Disp: 155 tablet, Rfl: 0   Cholecalciferol (VITAMIN D ) 2000 units CAPS, Take by mouth., Disp: , Rfl:    COVID-19 mRNA vaccine, Pfizer, (COMIRNATY) syringe, Inject 0.3 mLs into the muscle once for 1 dose., Disp: 0.3 mL, Rfl: 0   levothyroxine  (SYNTHROID ) 75 MCG tablet, TAKE 1 TABLET DAILY. **CHANGE IN DOSE, Disp: 90 tablet, Rfl: 1   METRONIDAZOLE, TOPICAL, 0.75 % LOTN, Apply topically., Disp: , Rfl:    Multiple Vitamins-Minerals (CENTRUM SILVER PO), Take 1 tablet by mouth daily., Disp: , Rfl:    nystatin  cream (MYCOSTATIN ), Apply 1 Application topically 2 (two) times daily. X10-14 days as needed for yeast rash under belly, Disp: 30 g, Rfl: 1   Propylene Glycol (SYSTANE BALANCE OP), Apply 1 drop to eye 2 (two) times daily as needed (Dry eyes). Reported on 01/31/2016, Disp: , Rfl:    rosuvastatin  (CRESTOR ) 40 MG tablet, Take 1 tablet (40 mg total) by mouth daily., Disp: 90 tablet, Rfl: 3   Triamcinolone  Acetonide (TRIAMCINOLONE  0.1 % CREAM : EUCERIN) CREA, Apply 1 application topically 2 (two) times daily., Disp: , Rfl:    vitamin B-12 (CYANOCOBALAMIN ) 1000 MCG tablet, Take 1,000 mcg by mouth every other day. Take one tablet every other day, Disp: , Rfl:    losartan  (COZAAR ) 50 MG tablet, Take 1 tablet (50 mg total) by mouth daily. **dose change, Disp: 90 tablet, Rfl: 3 Social History   Socioeconomic History   Marital status: Married    Spouse name: Garrel   Number of children: 1   Years of education: 16   Highest education level: Bachelor's degree (e.g., BA, AB, BS)  Occupational History   Occupation: Retired     Comment: Runner, broadcasting/film/video  Tobacco Use   Smoking status: Never   Smokeless tobacco: Never  Vaping Use   Vaping status: Never Used  Substance and Sexual Activity    Alcohol  use: No   Drug use: No   Sexual activity: Not Currently    Birth control/protection: None    Comment: not much  Other Topics Concern   Not on file  Social History Narrative   Lives with husband in a one story home. Back end of home has steps leading out to a deck. Grandson stays with them 5 days per week while his mother works   Her husband is very HOH, had recent brain surgery, she's his caretaker   Right handed   Drinks a lot of caffiene   Social Drivers of Health   Financial Resource Strain: Low Risk  (07/08/2024)   Overall Financial Resource Strain (CARDIA)    Difficulty of Paying Living Expenses: Not hard at  all  Food Insecurity: No Food Insecurity (07/08/2024)   Hunger Vital Sign    Worried About Running Out of Food in the Last Year: Never true    Ran Out of Food in the Last Year: Never true  Transportation Needs: No Transportation Needs (07/08/2024)   PRAPARE - Administrator, Civil Service (Medical): No    Lack of Transportation (Non-Medical): No  Physical Activity: Insufficiently Active (07/08/2024)   Exercise Vital Sign    Days of Exercise per Week: 2 days    Minutes of Exercise per Session: 20 min  Stress: Stress Concern Present (07/08/2024)   [employer] of Occupational Health - Occupational Stress Questionnaire    Feeling of Stress: To some extent  Social Connections: Moderately Isolated (07/08/2024)   Social Connection and Isolation Panel    Frequency of Communication with Friends and Family: More than three times a week    Frequency of Social Gatherings with Friends and Family: Once a week    Attends Religious Services: Patient declined    Database administrator or Organizations: No    Attends Engineer, structural: Not on file    Marital Status: Married  Catering manager Violence: Not At Risk (04/18/2023)   Humiliation, Afraid, Rape, and Kick questionnaire    Fear of Current or Ex-Partner: No    Emotionally Abused: No     Physically Abused: No    Sexually Abused: No   Family History  Problem Relation Age of Onset   COPD Mother        smoked   Parkinsonism Mother    Arthritis Mother    Epilepsy Mother        early in 20's   Myasthenia gravis Father    Asthma Father    Emphysema Father        smoked   Glaucoma Father    Arthritis Brother        knees   Heart disease Brother    Diabetes Brother    Hyperlipidemia Brother    Hyperlipidemia Brother    Hyperlipidemia Brother    Heart disease Brother    Hyperlipidemia Brother    Epilepsy Daughter 66   Diabetes Daughter    Kidney disease Maternal Uncle    CAD Paternal Grandmother 52   Uterine cancer Paternal Grandmother    Colon cancer Neg Hx    Esophageal cancer Neg Hx    Stomach cancer Neg Hx    Rectal cancer Neg Hx     Objective: Office vital signs reviewed. BP (!) 144/75   Pulse (!) 58   Temp 97.6 F (36.4 C)   Ht 5' 3 (1.6 m)   Wt 195 lb 3.2 oz (88.5 kg)   LMP 04/29/1991   SpO2 97%   BMI 34.58 kg/m   Physical Examination:  General: Awake, alert, obese, No acute distress HEENT: sclera white, MMM Cardio: regular rate and rhythm, S1S2 heard, no murmurs appreciated Pulm: clear to auscultation bilaterally, no wheezes, rhonchi or rales; normal work of breathing on room air Skin : Onychomycotic changes noted to the left thumb nail distally.  She has a few thin lines noted in the 3rd and 4th digit on the right hand of the nail.  Venous stasis changes with what appears to be hemosiderin deposition of the left lower extremity noted just below the calf.     07/13/2024    2:31 PM 06/24/2024    2:18 PM 01/10/2024    1:28 PM  Depression screen PHQ 2/9  Decreased Interest 1 0 0  Down, Depressed, Hopeless 0 1 0  PHQ - 2 Score 1 1 0  Altered sleeping 2 3   Tired, decreased energy 2 0   Change in appetite 2 1   Feeling bad or failure about yourself  0 0   Trouble concentrating 0 0   Moving slowly or fidgety/restless 3 1   Suicidal  thoughts 0 0   PHQ-9 Score 10 6   Difficult doing work/chores Somewhat difficult Somewhat difficult       07/13/2024    2:32 PM 06/24/2024    2:19 PM 05/27/2023   12:50 PM 05/25/2022    1:05 PM  GAD 7 : Generalized Anxiety Score  Nervous, Anxious, on Edge 1 0 1 1  Control/stop worrying 1 1 1  0  Worry too much - different things 1 1 0 0  Trouble relaxing 1 0 0 1  Restless 1 0 0 0  Easily annoyed or irritable 0 0 0 0  Afraid - awful might happen 0 0 1 0  Total GAD 7 Score 5 2 3 2   Anxiety Difficulty Very difficult Somewhat difficult Not difficult at all Not difficult at all    Assessment/ Plan: 73 y.o. female   Primary hypertension - Plan: COVID-19 mRNA vaccine, Pfizer, (COMIRNATY) syringe, losartan  (COZAAR ) 50 MG tablet  Chronic venous insufficiency - Plan: COVID-19 mRNA vaccine, Pfizer, (COMIRNATY) syringe  Chronic bilateral low back pain, unspecified whether sciatica present  Stress due to illness of family member - Plan: busPIRone  (BUSPAR ) 5 MG tablet  Polyuria - Plan: CMP14+EGFR, Bayer DCA Hb A1c Waived  Nail dystrophy - Plan: Anemia Profile B  Need for COVID-19 vaccine - Plan: COVID-19 mRNA vaccine, Pfizer, (COMIRNATY) syringe  Postablative hypothyroidism - Plan: TSH + free T4, CMP14+EGFR, COVID-19 mRNA vaccine, Pfizer, (COMIRNATY) syringe  Assessment and Plan    Hypertension Blood pressure elevated at 140-145/80-83 mmHg. Current management with losartan  25mg  daily. Chlorthalidone  used minimally due to side effects. - Increase losartan  dosage to 50mg .  Chronic venous insufficiency, left lower extremity > right Persistent symptoms despite compression hose. Previous ultrasound by Dr. Gretta. Follow-up with subspecialist in October planned. - Continue compression hose. - Follow up with subspecialist in October.  Polyuria Increased frequency and incontinence with dry mouth and eyes. ?autoimmune component - Check A11c  Onychomycosis and fingernail splitting, rule out  iron deficiency Fingernail splitting and possible onychomycosis. Left thumb shows fungal signs. Iron deficiency suspected. - Order lab tests for iron levels. - Prescribe paint-on antifungal soln if iron deficiency ruled out.  Chronic back pain Managed with Tylenol . Concerns about Tylenol  and blood pressure addressed. - Continue Tylenol  for pain management.  Caregiver stress and anxiety Significant stress and anxiety from caregiving. Buspirone  chosen for short-term management. - Prescribe buspirone . - Consider Cymbalta given chronic MSK concerns - Schedule follow-up in six weeks to assess buspirone . - Offer referral to behavioral specialist for therapy but she declined this today.     Hypothyroidism Check thyroid  levels.  She has got some increase stress and anxiety but this sounds to be situational and unlikely to be related to thyroid  levels  Written Rx for COVID vaccination given  Norene CHRISTELLA Fielding, DO [facility] [phone]

## 2024-07-14 ENCOUNTER — Ambulatory Visit: Payer: Self-pay | Admitting: Family Medicine

## 2024-07-14 ENCOUNTER — Encounter: Payer: Self-pay | Admitting: Family Medicine

## 2024-07-14 DIAGNOSIS — N1831 Chronic kidney disease, stage 3a: Secondary | ICD-10-CM | POA: Insufficient documentation

## 2024-07-14 LAB — ANEMIA PROFILE B
Basophils Absolute: 0 x10E3/uL (ref 0.0–0.2)
Basos: 0 %
EOS (ABSOLUTE): 0.1 x10E3/uL (ref 0.0–0.4)
Eos: 1 %
Ferritin: 42 ng/mL (ref 15–150)
Folate: >20 ng/mL (ref >3.0)
Hematocrit: 42.3 % (ref 34.0–46.6)
Hemoglobin: 13.6 g/dL (ref 11.1–15.9)
Immature Grans (Abs): 0 x10E3/uL (ref 0.0–0.1)
Immature Granulocytes: 0 %
Iron Saturation: 26 % (ref 15–55)
Iron: 93 ug/dL (ref 27–139)
Lymphocytes Absolute: 1.3 x10E3/uL (ref 0.7–3.1)
Lymphs: 27 %
MCH: 30 pg (ref 26.6–33.0)
MCHC: 32.2 g/dL (ref 31.5–35.7)
MCV: 93 fL (ref 79–97)
Monocytes Absolute: 0.6 x10E3/uL (ref 0.1–0.9)
Monocytes: 13 %
Neutrophils Absolute: 2.8 x10E3/uL (ref 1.4–7.0)
Neutrophils: 59 %
Platelets: 165 x10E3/uL (ref 150–450)
RBC: 4.53 x10E6/uL (ref 3.77–5.28)
RDW: 14.4 % (ref 11.7–15.4)
Retic Ct Pct: 1.3 % (ref 0.6–2.6)
Total Iron Binding Capacity: 353 ug/dL (ref 250–450)
UIBC: 260 ug/dL (ref 118–369)
Vitamin B-12: 1223 pg/mL (ref 232–1245)
WBC: 4.9 x10E3/uL (ref 3.4–10.8)

## 2024-07-14 LAB — CMP14+EGFR
ALT: 17 IU/L (ref 0–32)
AST: 26 IU/L (ref 0–40)
Albumin: 4.2 g/dL (ref 3.8–4.8)
Alkaline Phosphatase: 99 IU/L (ref 49–135)
BUN/Creatinine Ratio: 14 (ref 12–28)
BUN: 16 mg/dL (ref 8–27)
Bilirubin Total: 0.6 mg/dL (ref 0.0–1.2)
CO2: 22 mmol/L (ref 20–29)
Calcium: 9.3 mg/dL (ref 8.7–10.3)
Chloride: 103 mmol/L (ref 96–106)
Creatinine, Ser: 1.16 mg/dL — ABNORMAL HIGH (ref 0.57–1.00)
Globulin, Total: 1.9 g/dL (ref 1.5–4.5)
Glucose: 83 mg/dL (ref 70–99)
Potassium: 4.3 mmol/L (ref 3.5–5.2)
Sodium: 138 mmol/L (ref 134–144)
Total Protein: 6.1 g/dL (ref 6.0–8.5)
eGFR: 49 mL/min/1.73 — ABNORMAL LOW (ref >59)

## 2024-07-14 LAB — TSH+FREE T4
Free T4: 1.36 ng/dL (ref 0.82–1.77)
TSH: 0.811 u[IU]/mL (ref 0.450–4.500)

## 2024-07-15 ENCOUNTER — Telehealth: Payer: Self-pay

## 2024-07-15 NOTE — Telephone Encounter (Signed)
 Copied from CRM [id number]. Topic: Clinical - Request for Lab/Test Order >> Jul 15, 2024 11:04 AM Myrick T wrote: Reason for CRM: patient called to get an order/appt for a mammogram. Please f/u with patient

## 2024-07-17 NOTE — Telephone Encounter (Signed)
 Can you cosign one to me for wherever she wants to have it done?

## 2024-07-17 NOTE — Telephone Encounter (Signed)
Appt has been scheduled with patient.

## 2024-08-09 ENCOUNTER — Other Ambulatory Visit: Payer: Self-pay | Admitting: Family Medicine

## 2024-08-09 DIAGNOSIS — Z6379 Other stressful life events affecting family and household: Secondary | ICD-10-CM

## 2024-08-10 NOTE — Telephone Encounter (Signed)
 No. This is a taper up. A 90 day rx is NOT appropriate until we find what dose she will ultimately be on.

## 2024-08-12 ENCOUNTER — Ambulatory Visit: Admitting: Vascular Surgery

## 2024-08-13 ENCOUNTER — Ambulatory Visit

## 2024-08-24 ENCOUNTER — Ambulatory Visit: Admitting: Family Medicine

## 2024-08-24 ENCOUNTER — Ambulatory Visit (INDEPENDENT_AMBULATORY_CARE_PROVIDER_SITE_OTHER)

## 2024-08-24 ENCOUNTER — Encounter: Payer: Self-pay | Admitting: Family Medicine

## 2024-08-24 ENCOUNTER — Other Ambulatory Visit: Payer: Self-pay | Admitting: Family Medicine

## 2024-08-24 VITALS — BP 140/82 | HR 60 | Temp 97.3°F | Ht 63.0 in | Wt 196.2 lb

## 2024-08-24 DIAGNOSIS — Z780 Asymptomatic menopausal state: Secondary | ICD-10-CM | POA: Diagnosis not present

## 2024-08-24 DIAGNOSIS — B351 Tinea unguium: Secondary | ICD-10-CM | POA: Diagnosis not present

## 2024-08-24 DIAGNOSIS — Z6379 Other stressful life events affecting family and household: Secondary | ICD-10-CM

## 2024-08-24 DIAGNOSIS — M79601 Pain in right arm: Secondary | ICD-10-CM

## 2024-08-24 DIAGNOSIS — I10 Essential (primary) hypertension: Secondary | ICD-10-CM | POA: Diagnosis not present

## 2024-08-24 DIAGNOSIS — Z13820 Encounter for screening for osteoporosis: Secondary | ICD-10-CM

## 2024-08-24 DIAGNOSIS — N1831 Chronic kidney disease, stage 3a: Secondary | ICD-10-CM | POA: Diagnosis not present

## 2024-08-24 MED ORDER — CICLOPIROX 8 % EX SOLN: Freq: Every day | CUTANEOUS | 2 refills | Status: AC

## 2024-08-24 NOTE — Patient Instructions (Signed)
 Triceps Tendinitis Rehab Ask your health care provider which exercises are safe for you. Do exercises exactly as told by your provider and adjust them as directed. It is normal to feel mild stretching, pulling, tightness, or discomfort as you do these exercises. Stop right away if you feel sudden pain or your pain gets worse. Do not begin these exercises until told by your provider. Stretching and range-of-motion exercises These exercises warm up your muscles and joints and improve the movement and flexibility of your arm. These exercises can also help to relieve pain. Elbow flexion, assisted  Stand up or sit with your left / right arm at your side. Using your other hand, gently push your left / right arm toward your shoulder (assisted flexion). Bend your elbow as far as your provider tells you to. You may be told to try to bend your elbow more and more each week. Hold for __________ seconds. Slowly return to the starting position. Repeat __________ times. Complete this exercise __________ times a day. Elbow extension, supine  Lie on your back on a firm surface. This is the supine position. Place a folded towel under your left / right upper arm so your elbow and shoulder are at the same height. Your elbow should not touch the surface you are lying on. Move your left / right arm out to your side, keeping your elbow bent. Slowly straighten your elbow until you feel a stretch on the inside of your elbow (extension). Keep your arm and chest muscles relaxed. Hold for __________ seconds. Slowly return to the starting position. Repeat __________ times. Complete this exercise __________ times a day. Strengthening exercises These exercises build strength and endurance in your arm. Endurance is the ability to use your muscles for a long time, even after they get tired. Elbow flexion, supinated  Sit on a stable chair without armrests or stand up. Hold a __________ weight in your left / right hand, or  hold an exercise band with both hands. Your palms should face up toward the ceiling at the starting position. Bend your left / right elbow and move your hand up toward your shoulder (supinated flexion). Keep your other arm straight down, in the starting position. Slowly return to the starting position. Repeat __________ times. Complete this exercise __________ times a day. Elbow extension  Lie on your back. If told, hold a __________ weight in your left / right hand. Bend your left / right elbow to a 90-degree angle (right angle) so your elbow is pointed up toward the ceiling and the weight is over your head. Straighten your elbow, raising your hand toward the ceiling (extension). Use your other hand to support your left / right upper arm and to keep it still. Slowly return to the starting position. Repeat __________ times. Complete this exercise __________ times a day. Scapular retraction  Sit in a stable chair without armrests or stand up. Secure an exercise band to a stable object in front of you so the band is at shoulder height. Hold one end of the exercise band in each hand. Your palms should face in. Squeeze your shoulder blades (scapulae) together and move your elbows slightly behind you (retraction). Do not shrug your shoulders upward while you do this. Hold for __________ seconds. Slowly return to the starting position. Repeat __________ times. Complete this exercise __________ times a day. This information is not intended to replace advice given to you by your health care provider. Make sure you discuss any questions you have  with your health care provider. Document Revised: 08/03/2022 Document Reviewed: 08/03/2022 Elsevier Patient Education  2024 [employer].

## 2024-08-24 NOTE — Progress Notes (Signed)
 Subjective: RR:dumzdd/ HTN PCP: [name], DO YEP:Miranda Wilson Miranda Wilson is a 73 y.o. female presenting to clinic today for:  She was started on Buspar  last OV.  However, she reports that she did not decide to start it.  She does have it on hand but has been relying on prayer instead to center herself.  Losartan  increased to 50mg .  She reports blood pressures have been really good at home ranging anywhere from systolics of 104-131.  When it did get down to systolic of 104 she did feel dizzy on that day.  She has had no other concerning features.  She does not reporting chest pain, shortness of breath.  She does report some right sided arm pain that seems to be more prevalent at nighttime.  She utilizes this arm for her cane and actually after further discussion reports that she has been working visual merchandiser a closet recently.  She points to the posterior lateral aspect of the right arm as the area of discomfort, along her tricep area.  No sensory changes or weakness in that arm.   ROS: Per HPI  Allergies  Allergen Reactions   Clindamycin/Lincomycin Other (See Comments)    Dizzy spells   Codeine Nausea And Vomiting   Crestor  [Rosuvastatin  Calcium ] Other (See Comments)    Aching flu like large dose    Lipitor [Atorvastatin Calcium ] Other (See Comments)    Numbness in feet    Pravachol Other (See Comments)    Back pain     Simvastatin Other (See Comments)    Back pain     Ultram [Tramadol Hcl] Nausea And Vomiting   Vioxx [Rofecoxib] Other (See Comments)    Fluid rentention , elevated blood pressure    Feldene [Piroxicam] Rash   Myrbetriq [Mirabegron] Other (See Comments)    Constipation, headache   Penicillins Rash    Pt states she CAN take Keflex   Protonix  [Pantoprazole ] Rash   Toviaz [Fesoterodine Fumarate Er] Other (See Comments)    Dry skin, dry eyes, constipation   Past Medical History:  Diagnosis Date   At risk for sleep apnea    Bilateral edema of  lower extremity    left > right --  wear compression hose   Cervical spondylosis    Chronic constipation    Cystitis [id number]'d   Dr Smith - here    GERD (gastroesophageal reflux disease)    History of colon polyps    History of idiopathic seizure    AGE 27 to 20  X5  --  UNKNOW IDIOLOGY , PER PT WAS ANEMIC AT THE TIME--  NONE SINCE   History of tachycardia    S/P  RADIOACTIVE IODINE ABLATION OF THYROID    Hyperlipidemia    Hypertension    Hypothyroidism, postradioiodine therapy    AGE 4   IBS (irritable bowel syndrome)    Neuropathy    OA (osteoarthritis)    shoulders  left > right   PMB (postmenopausal bleeding)    S/P radioactive iodine thyroid  ablation    Urge urinary incontinence    Varicose veins    Vein disorder    LEFT ANKLE VEIN VALVE REFLUX WITH DECREASED CIRCULATION     Current Outpatient Medications:    Cholecalciferol (VITAMIN D ) 2000 units CAPS, Take by mouth., Disp: , Rfl:    levothyroxine  (SYNTHROID ) 75 MCG tablet, TAKE 1 TABLET DAILY. **CHANGE IN DOSE, Disp: 90 tablet, Rfl: 1   losartan  (COZAAR ) 50 MG tablet, Take 1 tablet (50 mg  total) by mouth daily. **dose change, Disp: 90 tablet, Rfl: 3   Multiple Vitamins-Minerals (CENTRUM SILVER PO), Take 1 tablet by mouth daily., Disp: , Rfl:    nystatin  cream (MYCOSTATIN ), Apply 1 Application topically 2 (two) times daily. X10-14 days as needed for yeast rash under belly, Disp: 30 g, Rfl: 1   Propylene Glycol (SYSTANE BALANCE OP), Apply 1 drop to eye 2 (two) times daily as needed (Dry eyes). Reported on 01/31/2016, Disp: , Rfl:    rosuvastatin  (CRESTOR ) 40 MG tablet, Take 1 tablet (40 mg total) by mouth daily., Disp: 90 tablet, Rfl: 3   Triamcinolone  Acetonide (TRIAMCINOLONE  0.1 % CREAM : EUCERIN) CREA, Apply 1 application topically 2 (two) times daily., Disp: , Rfl:    vitamin B-12 (CYANOCOBALAMIN ) 1000 MCG tablet, Take 1,000 mcg by mouth every other day. Take one tablet every other day, Disp: , Rfl:    busPIRone   (BUSPAR ) 5 MG tablet, Take 1 tablet (5 mg total) by mouth 2 (two) times daily for 7 days, THEN 1.5 tablets (7.5 mg total) 2 (two) times daily for 7 days, THEN 2 tablets (10 mg total) 2 (two) times daily. (Patient not taking: No sig reported), Disp: 155 tablet, Rfl: 0   METRONIDAZOLE, TOPICAL, 0.75 % LOTN, Apply topically., Disp: , Rfl:  Social History   Socioeconomic History   Marital status: Married    Spouse name: Miranda Wilson   Number of children: 1   Years of education: 16   Highest education level: Bachelor's degree (e.g., BA, AB, BS)  Occupational History   Occupation: Retired     Comment: Runner, Broadcasting/film/video  Tobacco Use   Smoking status: Never   Smokeless tobacco: Never  Vaping Use   Vaping status: Never Used  Substance and Sexual Activity   Alcohol  use: No   Drug use: No   Sexual activity: Not Currently    Birth control/protection: None    Comment: not much  Other Topics Concern   Not on file  Social History Narrative   Lives with husband in a one story home. Back end of home has steps leading out to a deck. Grandson stays with them 5 days per week while his mother works   Her husband is very HOH, had recent brain surgery, she's his caretaker   Right handed   Drinks a lot of caffiene   Social Drivers of Health   Financial Resource Strain: Low Risk  (08/19/2024)   Overall Financial Resource Strain (CARDIA)    Difficulty of Paying Living Expenses: Not very hard  Food Insecurity: No Food Insecurity (08/19/2024)   Hunger Vital Sign    Worried About Running Out of Food in the Last Year: Never true    Ran Out of Food in the Last Year: Never true  Transportation Needs: No Transportation Needs (08/19/2024)   PRAPARE - Administrator, Civil Service (Medical): No    Lack of Transportation (Non-Medical): No  Physical Activity: Insufficiently Active (08/19/2024)   Exercise Vital Sign    Days of Exercise per Week: 2 days    Minutes of Exercise per Session: 20 min  Stress: Stress  Concern Present (08/19/2024)   [employer] of Occupational Health - Occupational Stress Questionnaire    Feeling of Stress: To some extent  Social Connections: Moderately Integrated (08/19/2024)   Social Connection and Isolation Panel    Frequency of Communication with Friends and Family: More than three times a week    Frequency of Social Gatherings with Friends and Family:  Once a week    Attends Religious Services: More than 4 times per year    Active Member of Clubs or Organizations: No    Attends Engineer, Structural: Not on file    Marital Status: Married  Recent Concern: Social Connections - Moderately Isolated (07/08/2024)   Social Connection and Isolation Panel    Frequency of Communication with Friends and Family: More than three times a week    Frequency of Social Gatherings with Friends and Family: Once a week    Attends Religious Services: Patient declined    Database Administrator or Organizations: No    Attends Engineer, Structural: Not on file    Marital Status: Married  Catering Manager Violence: Not At Risk (04/18/2023)   Humiliation, Afraid, Rape, and Kick questionnaire    Fear of Current or Ex-Partner: No    Emotionally Abused: No    Physically Abused: No    Sexually Abused: No   Family History  Problem Relation Age of Onset   COPD Mother        smoked   Parkinsonism Mother    Arthritis Mother    Epilepsy Mother        early in 20's   Myasthenia gravis Father    Asthma Father    Emphysema Father        smoked   Glaucoma Father    Arthritis Brother        knees   Heart disease Brother    Diabetes Brother    Hyperlipidemia Brother    Hyperlipidemia Brother    Hyperlipidemia Brother    Heart disease Brother    Hyperlipidemia Brother    Epilepsy Daughter 69   Diabetes Daughter    Kidney disease Maternal Uncle    CAD Paternal Grandmother 48   Uterine cancer Paternal Grandmother    Colon cancer Neg Hx    Esophageal cancer Neg Hx     Stomach cancer Neg Hx    Rectal cancer Neg Hx     Objective: Office vital signs reviewed. BP (!) 140/82   Pulse 60   Temp (!) 97.3 F (36.3 C)   Ht 5' 3 (1.6 m)   Wt 196 lb 4 oz (89 kg)   LMP 04/29/1991   SpO2 99%   BMI 34.76 kg/m   Physical Examination:  General: Awake, alert, well nourished, No acute distress HEENT: sclera white, MMM Cardio: regular rate and rhythm  Pulm:  normal work of breathing on room air Extremities: Compression hose in place MSK: Minimally antalgic gait and normal station; uses cane for ambulation Nails: Onychomycotic changes to the nails of the feet     08/24/2024    1:41 PM 07/13/2024    2:31 PM 06/24/2024    2:18 PM  Depression screen PHQ 2/9  Decreased Interest 0 1 0  Down, Depressed, Hopeless 0 0 1  PHQ - 2 Score 0 1 1  Altered sleeping 0 2 3  Tired, decreased energy 0 2 0  Change in appetite 0 2 1  Feeling bad or failure about yourself  0 0 0  Trouble concentrating 0 0 0  Moving slowly or fidgety/restless 1 3 1   Suicidal thoughts 0 0 0  PHQ-9 Score 1 10 6   Difficult doing work/chores Somewhat difficult Somewhat difficult Somewhat difficult      08/24/2024    1:41 PM 07/13/2024    2:32 PM 06/24/2024    2:19 PM 05/27/2023   12:50 PM  GAD 7 : Generalized Anxiety Score  Nervous, Anxious, on Edge 0 1 0 1  Control/stop worrying 0 1 1 1   Worry too much - different things 0 1 1 0  Trouble relaxing 0 1 0 0  Restless 0 1 0 0  Easily annoyed or irritable 1 0 0 0  Afraid - awful might happen 0 0 0 1  Total GAD 7 Score 1 5 2 3   Anxiety Difficulty Somewhat difficult Very difficult Somewhat difficult Not difficult at all    Assessment/ Plan: 73 y.o. female   Stress due to illness of family member  Primary hypertension - Plan: Basic Metabolic Panel  Chronic kidney disease, stage 3a (HCC) - Plan: Basic Metabolic Panel  Onychomycosis of toenail - Plan: ciclopirox (PENLAC) 8 % solution  Right arm pain   Seems to be doing better  through prayer but has BuSpar  as a backup if needed.  Blood pressure is at goal for age.  Continue losartan .  Check BMP given increase in ARB  Penlac solution provided for onychomycosis of the nails  Suspect tendinopathy in that right arm.  Home physical therapy exercises were provided as well as samples of both topical Voltaren gel and topical Tylenol  with lidocaine   Norene CHRISTELLA Fielding, DO [facility] [phone]

## 2024-08-25 ENCOUNTER — Ambulatory Visit: Payer: Self-pay | Admitting: Family Medicine

## 2024-08-25 LAB — BASIC METABOLIC PANEL WITH GFR
BUN/Creatinine Ratio: 12 (ref 12–28)
BUN: 13 mg/dL (ref 8–27)
CO2: 24 mmol/L (ref 20–29)
Calcium: 9.5 mg/dL (ref 8.7–10.3)
Chloride: 103 mmol/L (ref 96–106)
Creatinine, Ser: 1.11 mg/dL — ABNORMAL HIGH (ref 0.57–1.00)
Glucose: 75 mg/dL (ref 70–99)
Potassium: 4.2 mmol/L (ref 3.5–5.2)
Sodium: 140 mmol/L (ref 134–144)
eGFR: 52 mL/min/1.73 — ABNORMAL LOW (ref >59)

## 2024-08-26 ENCOUNTER — Ambulatory Visit: Payer: Self-pay | Admitting: Family Medicine

## 2024-08-26 DIAGNOSIS — Z780 Asymptomatic menopausal state: Secondary | ICD-10-CM | POA: Diagnosis not present

## 2024-08-27 ENCOUNTER — Other Ambulatory Visit: Payer: Self-pay | Admitting: Family Medicine

## 2024-08-27 DIAGNOSIS — Z1231 Encounter for screening mammogram for malignant neoplasm of breast: Secondary | ICD-10-CM

## 2024-09-07 ENCOUNTER — Ambulatory Visit
Admission: RE | Admit: 2024-09-07 | Discharge: 2024-09-07 | Disposition: A | Discharge status: Home / Self Care | Source: Ambulatory Visit | Attending: Family Medicine | Admitting: Family Medicine

## 2024-09-07 DIAGNOSIS — Z1231 Encounter for screening mammogram for malignant neoplasm of breast: Secondary | ICD-10-CM

## 2024-12-02 ENCOUNTER — Ambulatory Visit: Admitting: Vascular Surgery

## 2025-01-12 ENCOUNTER — Encounter: Payer: Self-pay | Admitting: Family Medicine

## 2025-02-24 ENCOUNTER — Ambulatory Visit: Admitting: Vascular Surgery
